# Patient Record
Sex: Female | Born: 1950
Health system: Southern US, Community
[De-identification: ages and names within clinical notes are randomized; demographics above are authoritative.]

## PROBLEM LIST (undated history)

## (undated) DIAGNOSIS — E785 Hyperlipidemia, unspecified: Secondary | ICD-10-CM

## (undated) DIAGNOSIS — K219 Gastro-esophageal reflux disease without esophagitis: Secondary | ICD-10-CM

## (undated) DIAGNOSIS — R011 Cardiac murmur, unspecified: Secondary | ICD-10-CM

## (undated) DIAGNOSIS — R569 Unspecified convulsions: Secondary | ICD-10-CM

## (undated) DIAGNOSIS — E119 Type 2 diabetes mellitus without complications: Secondary | ICD-10-CM

## (undated) DIAGNOSIS — C50919 Malignant neoplasm of unspecified site of unspecified female breast: Secondary | ICD-10-CM

## (undated) DIAGNOSIS — I639 Cerebral infarction, unspecified: Secondary | ICD-10-CM

## (undated) DIAGNOSIS — M1990 Unspecified osteoarthritis, unspecified site: Secondary | ICD-10-CM

## (undated) DIAGNOSIS — I10 Essential (primary) hypertension: Secondary | ICD-10-CM

## (undated) DIAGNOSIS — E559 Vitamin D deficiency, unspecified: Secondary | ICD-10-CM

## (undated) HISTORY — DX: Vitamin D deficiency, unspecified: E55.9

## (undated) HISTORY — DX: Malignant neoplasm of unspecified site of unspecified female breast: C50.919

## (undated) HISTORY — DX: Unspecified osteoarthritis, unspecified site: M19.90

## (undated) HISTORY — DX: Type 2 diabetes mellitus without complications: E11.9

## (undated) HISTORY — PX: TONSILECTOMY/ADENOIDECTOMY WITH MYRINGOTOMY: SHX6125

## (undated) HISTORY — DX: Gastro-esophageal reflux disease without esophagitis: K21.9

## (undated) HISTORY — DX: Cerebral infarction, unspecified: I63.9

## (undated) HISTORY — DX: Cardiac murmur, unspecified: R01.1

## (undated) HISTORY — DX: Essential (primary) hypertension: I10

## (undated) HISTORY — DX: Hyperlipidemia, unspecified: E78.5

---

## 1998-12-27 HISTORY — PX: ABDOMINAL HYSTERECTOMY: SHX81

## 1999-12-28 DIAGNOSIS — C50919 Malignant neoplasm of unspecified site of unspecified female breast: Secondary | ICD-10-CM

## 1999-12-28 HISTORY — PX: MASTECTOMY: SHX3

## 1999-12-28 HISTORY — DX: Malignant neoplasm of unspecified site of unspecified female breast: C50.919

## 2003-05-01 ENCOUNTER — Encounter: Admission: RE | Admit: 2003-05-01 | Discharge: 2003-06-27 | Payer: Self-pay | Admitting: Unknown Physician Specialty

## 2005-03-02 ENCOUNTER — Ambulatory Visit: Payer: Self-pay | Admitting: Cardiology

## 2005-03-09 ENCOUNTER — Ambulatory Visit: Payer: Self-pay | Admitting: Cardiology

## 2006-04-07 ENCOUNTER — Encounter: Admission: RE | Admit: 2006-04-07 | Discharge: 2006-04-07 | Payer: Self-pay | Admitting: *Deleted

## 2006-04-19 ENCOUNTER — Ambulatory Visit: Payer: Self-pay

## 2008-12-27 HISTORY — PX: GANGLION CYST EXCISION: SHX1691

## 2012-08-09 ENCOUNTER — Ambulatory Visit (AMBULATORY_SURGERY_CENTER): Payer: BC Managed Care – PPO | Admitting: *Deleted

## 2012-08-09 ENCOUNTER — Encounter: Payer: Self-pay | Admitting: Internal Medicine

## 2012-08-09 VITALS — Ht 66.0 in | Wt 174.4 lb

## 2012-08-09 DIAGNOSIS — Z1211 Encounter for screening for malignant neoplasm of colon: Secondary | ICD-10-CM

## 2012-08-09 MED ORDER — NA SULFATE-K SULFATE-MG SULF 17.5-3.13-1.6 GM/177ML PO SOLN: ORAL | Status: DC

## 2012-08-17 ENCOUNTER — Encounter: Payer: Self-pay | Admitting: Internal Medicine

## 2012-08-17 ENCOUNTER — Ambulatory Visit (AMBULATORY_SURGERY_CENTER): Payer: BC Managed Care – PPO | Admitting: Internal Medicine

## 2012-08-17 VITALS — BP 165/85 | HR 65 | Temp 97.9°F | Resp 16 | Ht 66.0 in | Wt 174.0 lb

## 2012-08-17 DIAGNOSIS — Z1211 Encounter for screening for malignant neoplasm of colon: Secondary | ICD-10-CM

## 2012-08-17 DIAGNOSIS — D126 Benign neoplasm of colon, unspecified: Secondary | ICD-10-CM

## 2012-08-17 MED ORDER — SODIUM CHLORIDE 0.9 % IV SOLN: 500.0000 mL | INTRAVENOUS | Status: DC

## 2012-08-17 MED ORDER — HYDROCORTISONE ACETATE 25 MG RE SUPP: 25.0000 mg | Freq: Two times a day (BID) | RECTAL | Status: AC

## 2012-08-17 NOTE — Patient Instructions (Addendum)

## 2012-08-17 NOTE — Op Note (Signed)
[facility] [address] [place], [address]   COLONOSCOPY PROCEDURE REPORT  PATIENT: Renee, [name]  MR#: [id number] BIRTHDATE: 09-15-1951 , 61  yrs. old GENDER: Female ENDOSCOPIST: Beverley Fiedler, MD REFERRED XB:[id number], Cynthia PROCEDURE DATE:  08/17/2012 PROCEDURE:   Colonoscopy with snare polypectomy ASA CLASS:   Class II INDICATIONS:average risk screening and first colonoscopy. MEDICATIONS: Propofol (Diprivan) and Propofol (Diprivan) 170 mg IV  DESCRIPTION OF PROCEDURE:   After the risks benefits and alternatives of the procedure were thoroughly explained, informed consent was obtained.  A digital rectal exam revealed no rectal mass and A digital rectal exam revealed no abnormalities of the rectum.   The LB [id number]-[id number]  endoscope was introduced through the anus and advanced to the terminal ileum which was intubated for a short distance. No adverse events experienced. The quality of the prep was Suprep excellent  The instrument was then slowly withdrawn as the colon was fully examined.   COLON FINDINGS: The mucosa appeared normal in the terminal ileum. Three sessile polyps ranging between 3-38mm in size were found in the rectosigmoid colon.  Polypectomy was performed with a cold snare.  The resection was complete and the polyp tissue was completely retrieved. Retroflexed views revealed internal hemorrhoids            The scope was withdrawn and the procedure completed. COMPLICATIONS: There were no complications.  ENDOSCOPIC IMPRESSION: 1.   Normal mucosa in the terminal ileum 2.   Three sessile polyps ranging between 3-79mm in size were found in the rectosigmoid colon; polypectomy was performed with a cold snare 3.   Small internal hemorrhoids  RECOMMENDATIONS: 1.  Await pathology results 2.  If the polyp(s) removed today are proven to be adenomatous (pre-cancerous) polyps, you will need a repeat colonoscopy in 5 years.  Otherwise you should  continue to follow colorectal cancer screening guidelines for "routine risk" patients with colonoscopy in 10 years.  You will receive a letter within 1-2 weeks with the results of your biopsy as well as final recommendations.  Please call my office if you have not received a letter after 3 weeks. 3.  Trial of hydrocortisone suppository twice daily for 1 week.  eSigned:  Beverley Fiedler, MD 08/17/2012 11:59 AM   cc: Samuel Jester, DO and The Patient   PATIENT NAME:  Renee, [name] MR#: [id number]

## 2012-08-17 NOTE — Progress Notes (Signed)
The pt tolerated the colonoscopy very well. Maw   

## 2012-08-17 NOTE — Progress Notes (Signed)
Patient did not experience any of the following events: a burn prior to discharge; a fall within the facility; wrong site/side/patient/procedure/implant event; or a hospital transfer or hospital admission upon discharge from the facility. (G8907) Patient did not have preoperative order for IV antibiotic SSI prophylaxis. (G8918)  

## 2012-08-18 ENCOUNTER — Telehealth: Payer: Self-pay

## 2012-08-18 NOTE — Telephone Encounter (Signed)
Left message with family member to tell her we called. May call back with any questions or concerns.

## 2012-08-21 ENCOUNTER — Encounter: Payer: Self-pay | Admitting: Internal Medicine

## 2012-08-23 ENCOUNTER — Encounter: Payer: Self-pay | Admitting: Internal Medicine

## 2013-04-18 DIAGNOSIS — R42 Dizziness and giddiness: Secondary | ICD-10-CM

## 2013-12-27 LAB — HM MAMMOGRAPHY

## 2013-12-31 ENCOUNTER — Emergency Department (HOSPITAL_COMMUNITY)
Admission: EM | Admit: 2013-12-31 | Discharge: 2013-12-31 | Disposition: A | Discharge status: Home / Self Care | Payer: BC Managed Care – PPO | Attending: Emergency Medicine | Admitting: Emergency Medicine

## 2013-12-31 ENCOUNTER — Emergency Department (HOSPITAL_COMMUNITY): Payer: BC Managed Care – PPO

## 2013-12-31 ENCOUNTER — Encounter (HOSPITAL_COMMUNITY): Payer: Self-pay | Admitting: Emergency Medicine

## 2013-12-31 DIAGNOSIS — I10 Essential (primary) hypertension: Secondary | ICD-10-CM | POA: Insufficient documentation

## 2013-12-31 DIAGNOSIS — Z8673 Personal history of transient ischemic attack (TIA), and cerebral infarction without residual deficits: Secondary | ICD-10-CM | POA: Insufficient documentation

## 2013-12-31 DIAGNOSIS — M129 Arthropathy, unspecified: Secondary | ICD-10-CM | POA: Insufficient documentation

## 2013-12-31 DIAGNOSIS — R259 Unspecified abnormal involuntary movements: Secondary | ICD-10-CM | POA: Insufficient documentation

## 2013-12-31 DIAGNOSIS — Z791 Long term (current) use of non-steroidal anti-inflammatories (NSAID): Secondary | ICD-10-CM | POA: Insufficient documentation

## 2013-12-31 DIAGNOSIS — K219 Gastro-esophageal reflux disease without esophagitis: Secondary | ICD-10-CM | POA: Insufficient documentation

## 2013-12-31 DIAGNOSIS — G40909 Epilepsy, unspecified, not intractable, without status epilepticus: Secondary | ICD-10-CM | POA: Insufficient documentation

## 2013-12-31 DIAGNOSIS — Z853 Personal history of malignant neoplasm of breast: Secondary | ICD-10-CM | POA: Insufficient documentation

## 2013-12-31 DIAGNOSIS — E119 Type 2 diabetes mellitus without complications: Secondary | ICD-10-CM | POA: Insufficient documentation

## 2013-12-31 DIAGNOSIS — E559 Vitamin D deficiency, unspecified: Secondary | ICD-10-CM | POA: Insufficient documentation

## 2013-12-31 DIAGNOSIS — R011 Cardiac murmur, unspecified: Secondary | ICD-10-CM | POA: Insufficient documentation

## 2013-12-31 DIAGNOSIS — Z79899 Other long term (current) drug therapy: Secondary | ICD-10-CM | POA: Insufficient documentation

## 2013-12-31 DIAGNOSIS — Z7982 Long term (current) use of aspirin: Secondary | ICD-10-CM | POA: Insufficient documentation

## 2013-12-31 DIAGNOSIS — E785 Hyperlipidemia, unspecified: Secondary | ICD-10-CM | POA: Insufficient documentation

## 2013-12-31 DIAGNOSIS — R569 Unspecified convulsions: Secondary | ICD-10-CM

## 2013-12-31 HISTORY — DX: Unspecified convulsions: R56.9

## 2013-12-31 LAB — COMPREHENSIVE METABOLIC PANEL
ALT: 20 U/L (ref 0–35)
AST: 23 U/L (ref 0–37)
Albumin: 3.8 g/dL (ref 3.5–5.2)
Alkaline Phosphatase: 86 U/L (ref 39–117)
BUN: 23 mg/dL (ref 6–23)
CHLORIDE: 101 meq/L (ref 96–112)
CO2: 24 mEq/L (ref 19–32)
Calcium: 10 mg/dL (ref 8.4–10.5)
Creatinine, Ser: 0.87 mg/dL (ref 0.50–1.10)
GFR calc Af Amer: 80 mL/min — ABNORMAL LOW (ref >90)
GFR, EST NON AFRICAN AMERICAN: 69 mL/min — AB (ref >90)
GLUCOSE: 186 mg/dL — AB (ref 70–99)
POTASSIUM: 4.3 meq/L (ref 3.7–5.3)
SODIUM: 138 meq/L (ref 137–147)
Total Bilirubin: 0.3 mg/dL (ref 0.3–1.2)
Total Protein: 7.8 g/dL (ref 6.0–8.3)

## 2013-12-31 LAB — CBC WITH DIFFERENTIAL/PLATELET
BASOS ABS: 0 10*3/uL (ref 0.0–0.1)
Basophils Relative: 0 % (ref 0–1)
EOS ABS: 0 10*3/uL (ref 0.0–0.7)
EOS PCT: 1 % (ref 0–5)
HEMATOCRIT: 40.9 % (ref 36.0–46.0)
Hemoglobin: 13.7 g/dL (ref 12.0–15.0)
LYMPHS PCT: 26 % (ref 12–46)
Lymphs Abs: 1.3 10*3/uL (ref 0.7–4.0)
MCH: 29.3 pg (ref 26.0–34.0)
MCHC: 33.5 g/dL (ref 30.0–36.0)
MCV: 87.4 fL (ref 78.0–100.0)
MONO ABS: 0.3 10*3/uL (ref 0.1–1.0)
MONOS PCT: 7 % (ref 3–12)
NEUTROS ABS: 3.4 10*3/uL (ref 1.7–7.7)
Neutrophils Relative %: 66 % (ref 43–77)
PLATELETS: 305 10*3/uL (ref 150–400)
RBC: 4.68 MIL/uL (ref 3.87–5.11)
RDW: 12.7 % (ref 11.5–15.5)
WBC: 5.1 10*3/uL (ref 4.0–10.5)

## 2013-12-31 MED ORDER — LORAZEPAM 2 MG/ML IJ SOLN
0.5000 mg | Freq: Once | INTRAMUSCULAR | Status: AC
  Administered 2013-12-31: 0.5 mg via INTRAVENOUS
  Filled 2013-12-31: qty 1

## 2013-12-31 MED ORDER — SODIUM CHLORIDE 0.9 % IV SOLN
500.0000 mg | Freq: Once | INTRAVENOUS | Status: AC
  Administered 2013-12-31: 500 mg via INTRAVENOUS
  Filled 2013-12-31: qty 5

## 2013-12-31 NOTE — ED Provider Notes (Signed)
CSN: [id number]     Arrival date & time 12/31/13  1032 History  This chart was scribed for Renee Diego, MD by Roxan Diesel, ED scribe.  This patient was seen in room APA11/APA11 and the patient's care was started at 11:09 AM.   Chief Complaint  Patient presents with  . Seizures    Patient is a 63 y.o. female presenting with seizures. The history is provided by the patient and a relative. No language interpreter was used.  Seizures Seizure activity on arrival: no   Preceding symptoms comment:  "shaking" per patient Episode characteristics: abnormal movements, generalized shaking and partial responsiveness   Episode characteristics comment:  "jumping, shaking, eyes rolling back in head," not verbally responsive Postictal symptoms comment:  "don't feel too good" Return to baseline: no   Severity:  Moderate Duration: longer than 20 minutes. History of seizures: yes (only over past month)     HPI Comments: Renee [name] is a 63 y.o. female who presents to the Emergency Department complaining of a suspected seizure that occurred this morning.  Family describes the seizure as an episode of "jumping, shaking, eyes rolling back in head" which lasted "a long time" (longer than 20 minutes).  Family deny syncope but state pt was not verbally responsive during the episode.  Pt states she did feel "shaking" prior to the episode.  Seizure activity has resolved on evaluation but pt states she still "don't feel too good."  Family reports that pt only began having these episode around one month ago several days after she began taking metformin for borderline DM.  Prior episodes lasted only from 5-20 minutes but were otherwise similar to her episode today.  Family report they have been told that these episodes are actually tremors rather than seizures.  Pt has since been taken off of metformin.  She was recently started on Keppra 1 tablet 2x/day and has a f/u neurology appointment in 5 days.  She  took her Keppra this morning.  Per family, pt has received a head CT and MRI at [facility].   Past Medical History  Diagnosis Date  . Hypertension   . GERD (gastroesophageal reflux disease)   . Vitamin D deficiency   . Hyperlipidemia   . Arthritis   . Heart murmur   . CVA (cerebral infarction) 2003, 2005  . Breast cancer 2001    right mastectomy  . Seizures    Past Surgical History  Procedure Laterality Date  . Mastectomy  2001    right  . Total abominal hysterectomy  2000  . Ganglion cyst excision  2010    right hand   Family History  Problem Relation Age of Onset  . Colon cancer Neg Hx   . Stomach cancer Neg Hx    History  Substance Use Topics  . Smoking status: Never Smoker   . Smokeless tobacco: Never Used  . Alcohol Use: No   OB History   Grav Para Term Preterm Abortions TAB SAB Ect Mult Living                 Review of Systems  Constitutional: Negative for appetite change and fatigue.  HENT: Negative for congestion, ear discharge and sinus pressure.   Eyes: Negative for discharge.  Respiratory: Negative for cough.   Cardiovascular: Negative for chest pain.  Gastrointestinal: Negative for abdominal pain and diarrhea.  Genitourinary: Negative for frequency and hematuria.  Musculoskeletal: Negative for back pain.  Skin: Negative for rash.  Neurological: Positive  for seizures. Negative for headaches.  Psychiatric/Behavioral: Negative for hallucinations.    Allergies  Metformin and related  Home Medications   Current Outpatient Rx  Name  Route  Sig  Dispense  Refill  . aspirin 81 MG tablet   Oral   Take 81 mg by mouth daily.         . bisoprolol-hydrochlorothiazide (ZIAC) 2.5-6.25 MG per tablet   Oral   Take 1 tablet by mouth daily.          Marland Kitchen docusate sodium (COLACE) 100 MG capsule   Oral   Take 100 mg by mouth daily.         . meloxicam (MOBIC) 7.5 MG tablet   Oral   Take 7.5 mg by mouth daily.         Marland Kitchen omeprazole (PRILOSEC) 20 MG  capsule   Oral   Take 20 mg by mouth daily.          . simvastatin (ZOCOR) 40 MG tablet   Oral   Take 40 mg by mouth at bedtime.          . Vitamin D, Ergocalciferol, (DRISDOL) 50000 UNITS CAPS      50,000 Units every 7 (seven) days.           BP 122/77  Pulse 75  Temp(Src) 98.6 F (37 C) (Oral)  Resp 19  Ht 5\' 6"  (1.676 m)  Wt 172 lb (78.019 kg)  BMI 27.77 kg/m2  SpO2 98%  Physical Exam  Nursing note and vitals reviewed. Constitutional: She is oriented to person, place, and time. She appears well-developed.  HENT:  Head: Normocephalic.  Eyes: Conjunctivae and EOM are normal. No scleral icterus.  Neck: Neck supple. No thyromegaly present.  Cardiovascular: Normal rate and regular rhythm.  Exam reveals no gallop and no friction rub.   No murmur heard. Pulmonary/Chest: No stridor. She has no wheezes. She has no rales. She exhibits no tenderness.  Abdominal: She exhibits no distension. There is no tenderness. There is no rebound.  Musculoskeletal: Normal range of motion. She exhibits no edema.  Lymphadenopathy:    She has no cervical adenopathy.  Neurological: She is oriented to person, place, and time. She displays tremor. She exhibits normal muscle tone. Coordination normal.  With lifting her legs, started to have tremors bilaterally  Skin: No rash noted. No erythema.  Psychiatric: She has a normal mood and affect. Her behavior is normal.    ED Course  Procedures (including critical care time)  DIAGNOSTIC STUDIES: Oxygen Saturation is 98% on room air, normal by my interpretation.    COORDINATION OF CARE: 11:14 AM-Discussed treatment plan which includes head CT with pt at bedside and pt agreed to plan.    Labs Review Labs Reviewed  COMPREHENSIVE METABOLIC PANEL - Abnormal; Notable for the following:    Glucose, Bld 186 (*)    GFR calc non Af Amer 69 (*)    GFR calc Af Amer 80 (*)    All other components within normal limits  CBC WITH DIFFERENTIAL     Imaging Review Ct Head Wo Contrast  12/31/2013   CLINICAL DATA:  Seizure.  EXAM: CT HEAD WITHOUT CONTRAST  TECHNIQUE: Contiguous axial images were obtained from the base of the skull through the vertex without intravenous contrast.  COMPARISON:  [facility] 04/07/2016  FINDINGS: No mass. No hydrocephalus. No hemorrhage. No acute bony abnormality. Visualized paranasal sinuses are clear. Mastoids are clear.  IMPRESSION: Negative exam.   Electronically Signed  By: [place]   On: 12/31/2013 12:09    EKG Interpretation   None       MDM  Sz,  Will increase keppra and follow up with neurology  Renee Diego, MD 12/31/13 [phone]

## 2013-12-31 NOTE — ED Notes (Signed)
Patient with no complaints at this time. Respirations even and unlabored. Skin warm/dry. Discharge instructions reviewed with patient at this time. Patient given opportunity to voice concerns/ask questions. IV removed per policy and band-aid applied to site. Patient discharged at this time and left Emergency Department via wheelchair. Patient in NAD

## 2013-12-31 NOTE — ED Notes (Signed)
Witnessed seizure by family and by EMS today.  EMS reports pt was postictal upon their arrival.  CBG in route 263.  Pt alert to self only at this time.  C/o headache.  nad noted.

## 2013-12-31 NOTE — Discharge Instructions (Signed)
Increase your seizure medicine to 2 pills in the morning and one pill in the evening.  Follow up with the neurologist as planned

## 2014-01-02 ENCOUNTER — Ambulatory Visit (INDEPENDENT_AMBULATORY_CARE_PROVIDER_SITE_OTHER): Payer: BC Managed Care – PPO | Admitting: Neurology

## 2014-01-02 ENCOUNTER — Encounter: Payer: Self-pay | Admitting: Neurology

## 2014-01-02 VITALS — BP 98/62 | HR 60 | Resp 14 | Ht 66.0 in | Wt 177.0 lb

## 2014-01-02 DIAGNOSIS — R569 Unspecified convulsions: Secondary | ICD-10-CM

## 2014-01-02 NOTE — Progress Notes (Signed)
Renee [name] was seen today in neurologic consultation at the request of Terald Sleeper, PA-C.  The consultation is for the evaluation of seizure vs tremor.  She is accompanied by her sister who supplements the hx.  She was seen at [facility] for the same.  I have one note from her hospitalization.  She was hospitalized on December 2; they did not mention seizure in the d/c summary, but did mention that she had difficulty ambulating because of shaking and chills that they felt was secondary to an acute urinary tract infection and resultant weakness.  Keppra was in the list of d/c meds.  Her sister states that what got her to the hospital was that she began to have CP after being on metformin for 3 days.  She called the dr and the metformin was d/c.  On Dec 1,  A friend came to the house and the pt went to answer the door and she was so weak that she could barely move.  She states that the entire L side (arm and leg) felt weak.  Her friend said "whats the matter" and the pt heard her but couldn't respond initially and then could say "I am sick."  Her friend helped her to the sofa and then the pt leaned to the L side.  The pt was able to give her sisters phone number to her friend.  She was shaking on the sofa while awake.  When the paramedics came, they talked with her but she was slow to respond.  Her sister states that some said it was tremor and some at [facility] said it was seizure.  She was started on Keppra in that hospital.  She was d/c to SNF for rehab.  She presented again to [facility] ED on 12/31/12 with an episode of "seizure."  She was at home, got out of bed and began to tremor in the legs and walked back to the bedroom and then began to shake all over without change or alteration in consciousness.  She called her sister while shaking and told her to come over.  Her sister got there, asked if she was okay and she responded but was slow.  Then her eyes seemed to roll back in the head and  she was less responsive.  If someone yelled at her, she would momentarily open the eyes.  It lasted 30-45 min per pts sister. At that hospitalization, her keppra was increased to 2 in the AM and 1 in the PM.  Her sister noted that when it would "wear off" she would note the onset of shaking so that she is now taking it tid.  She denies new stressful events.     The pt sister states that the pt had seizures 15-20 years ago.  Those events consisted of whole body shaking without loss or alteration in consciousness.  No meds were given.  They did think that it was seizure according to the pt and it only lasted 3 months and then went away.     Neuroimaging has  previously been performed.  It  available for my review today.  A CT of the brain was performed on 12/31/13 and was unremarkable.   She apparently had an echo, nuclear stress, MRI brain at [facility] but I don't have those.  She has not had an EEG.    PREVIOUS MEDICATIONS: Not applicable  ALLERGIES:   Allergies  Allergen Reactions  . Metformin And Related Other (See Comments)  seizures    CURRENT MEDICATIONS:  Current Outpatient Prescriptions on File Prior to Visit  Medication Sig Dispense Refill  . aspirin 81 MG tablet Take 81 mg by mouth daily.      Marland Kitchen atorvastatin (LIPITOR) 10 MG tablet Take 10 mg by mouth daily.      . bisoprolol (ZEBETA) 5 MG tablet Take 5 mg by mouth daily.      Marland Kitchen docusate sodium (COLACE) 100 MG capsule Take 100 mg by mouth daily.      Marland Kitchen levETIRAcetam (KEPPRA) 500 MG tablet Take 500 mg by mouth 2 (two) times daily. 2 pills in AM, 1 in PM since Monday      . meloxicam (MOBIC) 7.5 MG tablet Take 7.5 mg by mouth daily.      . Multiple Vitamin (MULTIVITAMIN WITH MINERALS) TABS tablet Take 1 tablet by mouth daily.      Marland Kitchen omeprazole (PRILOSEC) 20 MG capsule Take 20 mg by mouth daily.       . pantoprazole (PROTONIX) 40 MG tablet Take 40 mg by mouth daily.      . potassium chloride SA (K-DUR,KLOR-CON) 20 MEQ tablet Take 20  mEq by mouth daily.      . simvastatin (ZOCOR) 40 MG tablet Take 40 mg by mouth at bedtime.        No current facility-administered medications on file prior to visit.    PAST MEDICAL HISTORY:   Past Medical History  Diagnosis Date  . Hypertension   . GERD (gastroesophageal reflux disease)   . Vitamin D deficiency   . Hyperlipidemia   . Arthritis   . Heart murmur   . CVA (cerebral infarction) 2003, 2005  . Breast cancer 2001    right mastectomy  . Seizures   . DM (diabetes mellitus)     PAST SURGICAL HISTORY:   Past Surgical History  Procedure Laterality Date  . Mastectomy  2001    right  . Abdominal hysterectomy  2000  . Ganglion cyst excision  2010    right hand    SOCIAL HISTORY:   History   Social History  . Marital Status: Single    Spouse Name: N/A    Number of Children: N/A  . Years of Education: N/A   Occupational History  . Not on file.   Social History Main Topics  . Smoking status: Never Smoker   . Smokeless tobacco: Never Used  . Alcohol Use: No  . Drug Use: No  . Sexual Activity: Not on file   Other Topics Concern  . Not on file   Social History Narrative  . No narrative on file    FAMILY HISTORY:   Family Status  Relation Status Death Age  . Mother Deceased     parkinson's disease, alzheimer's disease  . Father Deceased     kidney disease, enlarged heart  . Sister Alive     healthy    ROS:  A complete 10 system review of systems was obtained and was unremarkable apart from what is mentioned above.  PHYSICAL EXAMINATION:    VITALS:   Filed Vitals:   01/02/14 0844  BP: 98/62  Pulse: 60  Resp: 14  Height: 5\' 6"  (1.676 m)  Weight: 177 lb (80.287 kg)    GEN:  Normal appears female in no acute distress.  Appears stated age. HEENT:  Normocephalic, atraumatic. The mucous membranes are moist. The superficial temporal arteries are without ropiness or tenderness. Cardiovascular: Regular rate and rhythm. Lungs: Clear  to  auscultation bilaterally. Neck/Heme: There are no carotid bruits noted bilaterally.  NEUROLOGICAL: Orientation:  The patient is alert and oriented x 3.  Fund of knowledge is appropriate.  Recent and remote memory intact.  Attention span and concentration normal.  Repeats and names without difficulty. Cranial nerves: There is good facial symmetry. The pupils are equal round and reactive to light bilaterally. Fundoscopic exam reveals clear disc margins bilaterally. Extraocular muscles are intact and visual fields are full to confrontational testing. Speech is fluent and clear. Soft palate rises symmetrically and there is no tongue deviation. Hearing is intact to conversational tone. Tone: Tone is good throughout. Sensation: Sensation is intact to light touch and pinprick throughout (facial, trunk, extremities). Vibration is intact at the bilateral big toe.  Interestingly, there is vibrational splitting on the forehead, stating that sometimes she feels it on the left and sometimes not. There is no extinction with double simultaneous stimulation. There is no sensory dermatomal level identified. Coordination:  The patient has no difficulty with RAM's or FNF bilaterally. Motor: Strength is 5/5 in the bilateral upper and lower extremities.  Shoulder shrug is equal and symmetric. There is no pronator drift.  There are no fasciculations noted. DTR's: Deep tendon reflexes are 2/4 at the bilateral biceps, triceps, brachioradialis, patella and trace at the bilateral achilles.  Plantar responses are downgoing bilaterally. Gait and Station: The patient requires both her sister and myself to help her get out of the chair.  She takes a few steps with assistance, and no tremor is noted.  She reports that she feels very unstable.  Mov't examination:  Intermittently, the patient has tremor in both legs.  The frequency of the tremor in the legs varies.  Upon beginning to examine the patient, she jerked her right hand above  the head and the left hand and both legs came straight out.  When asked to bring her hands down and legs down, she did.  Not long thereafter, she had a violent tremor of both hands and both legs that lasted momentarily with turning of the head to both sides, but stopped as the examination proceeded.   IMPRESSION/PLAN  1. Abnormal shaking spells.    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  Much of her examination today was nonphysiologic and atypical.  In addition, when most epileptic seizures present as shaking all over, there is loss of consciousness completely.  They very much want a definitive diagnosis.  She will have an EEG.  Hopefully, we will capture a spell, as she had several in the office today.  If not, then we will consider an ambulatory EEG and even EMU at [facility], if necessary.  For now, I did not change her Keppra, 500 mg 3 times a day and she will remain on the medication.    Addendum:  Following the visit, pt signed release and was able to get records from [place].  MRI brain essentially normal (minimal to mild small vessel disease).  Negative stress test.  Echo essentially normal with normal LVEF.   ED notes, H and P and d/c summary present but notes regarding the actual hospitalization and why Keppra was added are not present.

## 2014-01-02 NOTE — Patient Instructions (Addendum)
1.  EEG is scheduled for Thursday 1/8 at 2:15pm at [facility]. 2.  No driving, no tub baths, no swimming alone, no working with heavy/dangerous equipment.  Someone should be with you all of the time. 3.  Call me after your EEG is done 4.  Continue your keppra for now

## 2014-01-03 ENCOUNTER — Ambulatory Visit: Payer: BC Managed Care – PPO | Admitting: Neurology

## 2014-01-03 ENCOUNTER — Ambulatory Visit (HOSPITAL_COMMUNITY)
Admission: RE | Admit: 2014-01-03 | Discharge: 2014-01-03 | Disposition: A | Discharge status: Home / Self Care | Payer: BC Managed Care – PPO | Source: Ambulatory Visit | Attending: Neurology | Admitting: Neurology

## 2014-01-03 DIAGNOSIS — R569 Unspecified convulsions: Secondary | ICD-10-CM

## 2014-01-03 NOTE — Progress Notes (Signed)
EEG completed; results pending.    

## 2014-01-04 ENCOUNTER — Telehealth: Payer: Self-pay | Admitting: Neurology

## 2014-01-04 DIAGNOSIS — R569 Unspecified convulsions: Secondary | ICD-10-CM

## 2014-01-04 NOTE — Telephone Encounter (Signed)
Let pt know that her EEG looked good.  They did do an EKG lead and there were some minor abnormalities on that.  I know that she follows with cardiology and just had a full cardiology w/u at [facility] but make sure that she knows to f/u with them ([facility] I believe).  As for the EEG, tell her our next step will be to do a 24 hour EEG.  This can be done in a few weeks and Dr. Delice Lesch can read that for [place].  Can we schedule it for the first week of feb with Dr. Delice Lesch to read?

## 2014-01-04 NOTE — Telephone Encounter (Signed)
Spoke with patient. Explained that her EEG looked good and the next step would be to set up a 24 hour EEG. She would like me to call back and set this up with her sister, Berneice Heinrich, on Monday. Her number is [phone]. I talked to her about her abnormal EKG. She states she has not recently seen a heart doctor and the one she saw a long time ago moved from [place]. She does not remember his name. She would like me to talk with her sister about this as well. I will touch base with them again on Monday morning.

## 2014-01-04 NOTE — Procedures (Signed)
TECHNICAL SUMMARY:  An 18 channel referential and bipolar montage EEG using the standard international 10-20 system was performed on the patient described as awake, drowsy and asleep.  The dominant background activity consists of 8-8.5 hertz activity seen most prominantly over the anterior head region.  The backgound activity is reactive to eye opening and closing procedures.  Low voltage fast (beta) activity is distributed symmetrically and maximally over the anterior head regions.  ACTIVATION:  Stepwise photic stimulation at 4-20 flashes per second was performed and did not elicit any abnormal waveforms.  Hyperventilation was performed for 3 minutes with good patient effort and produced no changes in the background activity.  EPILEPTIFORM ACTIVITY:  There were no spikes, sharp waves or paroxysmal activity.  SLEEP:  Stage 1 and 2 sleep were noted.  CARDIAC:  The EKG lead revealed a sinus rhythm with PVC's and irregular p wave distribution at times.  IMPRESSION:  This is a normal EEG for the patients stated age.  There were no focal, hemispheric or lateralizing features.  No epileptiform activity was recorded.  As above, the EKG lead was regular with ectopic PVC's and p wave abnormalities.  Correlate clinically.

## 2014-01-05 ENCOUNTER — Emergency Department (HOSPITAL_COMMUNITY)
Admission: EM | Admit: 2014-01-05 | Discharge: 2014-01-05 | Disposition: A | Discharge status: Home / Self Care | Payer: BC Managed Care – PPO | Attending: Emergency Medicine | Admitting: Emergency Medicine

## 2014-01-05 ENCOUNTER — Encounter (HOSPITAL_COMMUNITY): Payer: Self-pay | Admitting: Emergency Medicine

## 2014-01-05 DIAGNOSIS — Z791 Long term (current) use of non-steroidal anti-inflammatories (NSAID): Secondary | ICD-10-CM | POA: Insufficient documentation

## 2014-01-05 DIAGNOSIS — M129 Arthropathy, unspecified: Secondary | ICD-10-CM | POA: Insufficient documentation

## 2014-01-05 DIAGNOSIS — K219 Gastro-esophageal reflux disease without esophagitis: Secondary | ICD-10-CM | POA: Insufficient documentation

## 2014-01-05 DIAGNOSIS — Z9889 Other specified postprocedural states: Secondary | ICD-10-CM | POA: Insufficient documentation

## 2014-01-05 DIAGNOSIS — Z853 Personal history of malignant neoplasm of breast: Secondary | ICD-10-CM | POA: Insufficient documentation

## 2014-01-05 DIAGNOSIS — G40909 Epilepsy, unspecified, not intractable, without status epilepticus: Secondary | ICD-10-CM | POA: Insufficient documentation

## 2014-01-05 DIAGNOSIS — IMO0002 Reserved for concepts with insufficient information to code with codable children: Secondary | ICD-10-CM | POA: Insufficient documentation

## 2014-01-05 DIAGNOSIS — Z8673 Personal history of transient ischemic attack (TIA), and cerebral infarction without residual deficits: Secondary | ICD-10-CM | POA: Insufficient documentation

## 2014-01-05 DIAGNOSIS — Z7982 Long term (current) use of aspirin: Secondary | ICD-10-CM | POA: Insufficient documentation

## 2014-01-05 DIAGNOSIS — R259 Unspecified abnormal involuntary movements: Secondary | ICD-10-CM | POA: Insufficient documentation

## 2014-01-05 DIAGNOSIS — L299 Pruritus, unspecified: Secondary | ICD-10-CM | POA: Insufficient documentation

## 2014-01-05 DIAGNOSIS — E785 Hyperlipidemia, unspecified: Secondary | ICD-10-CM | POA: Insufficient documentation

## 2014-01-05 DIAGNOSIS — R011 Cardiac murmur, unspecified: Secondary | ICD-10-CM | POA: Insufficient documentation

## 2014-01-05 DIAGNOSIS — E119 Type 2 diabetes mellitus without complications: Secondary | ICD-10-CM | POA: Insufficient documentation

## 2014-01-05 DIAGNOSIS — Z79899 Other long term (current) drug therapy: Secondary | ICD-10-CM | POA: Insufficient documentation

## 2014-01-05 DIAGNOSIS — I10 Essential (primary) hypertension: Secondary | ICD-10-CM | POA: Insufficient documentation

## 2014-01-05 MED ORDER — DIPHENHYDRAMINE HCL 25 MG PO TABS: 50.0000 mg | ORAL_TABLET | ORAL | Status: DC | PRN

## 2014-01-05 MED ORDER — PREDNISONE 20 MG PO TABS: ORAL_TABLET | ORAL | Status: DC

## 2014-01-05 NOTE — ED Provider Notes (Signed)
CSN: [id number]     Arrival date & time 01/05/14  1736 History   First MD Initiated Contact with Patient 01/05/14 1820     Chief Complaint  Patient presents with  . Pruritis   (Consider location/radiation/quality/duration/timing/severity/associated sxs/prior Treatment) HPI 63 year old female started on Toco for questionable seizure versus pseudoseizure versus generalized tremors as patient has had some spells when she is wide awake her whole body seems to shake and she feels generally weak but has not her issue today she was seen by neurology after having an unremarkable MRI at [facility] and normal OutPt EEG, until now neurology had continued the patient's Keppra, but now the patient presents with 2-3 days of generalized body itching without swelling without rash without pain without tongue swelling without lip swelling without shortness of breath without vomiting without lightheadedness however every time she takes Keppra her itching seems to get worse so she presents to the emergency department in the neurology clinic is closed over the weekend, her generalized itching is currently mild but does become moderate at times and has been constantly waxing and waning for the last few days. There is no treatment prior to arrival. Past Medical History  Diagnosis Date  . Hypertension   . GERD (gastroesophageal reflux disease)   . Vitamin D deficiency   . Hyperlipidemia   . Arthritis   . Heart murmur   . CVA (cerebral infarction) 2003, 2005  . Seizures   . DM (diabetes mellitus)   . Breast cancer 2001    right mastectomy   Past Surgical History  Procedure Laterality Date  . Mastectomy  2001    right  . Abdominal hysterectomy  2000  . Ganglion cyst excision  2010    right hand   Family History  Problem Relation Age of Onset  . Colon cancer Neg Hx   . Stomach cancer Neg Hx    History  Substance Use Topics  . Smoking status: Never Smoker   . Smokeless tobacco: Never  Used  . Alcohol Use: No   OB History   Grav Para Term Preterm Abortions TAB SAB Ect Mult Living                 Review of Systems 10 Systems reviewed and are negative for acute change except as noted in the HPI. Allergies  Metformin and related  Home Medications   Current Outpatient Rx  Name  Route  Sig  Dispense  Refill  . aspirin 81 MG tablet   Oral   Take 81 mg by mouth daily.         Marland Kitchen atorvastatin (LIPITOR) 10 MG tablet   Oral   Take 10 mg by mouth daily.         . bisoprolol (ZEBETA) 5 MG tablet   Oral   Take 5 mg by mouth daily.         . diphenhydrAMINE (BENADRYL) 25 MG tablet   Oral   Take 2 tablets (50 mg total) by mouth every 4 (four) hours as needed for itching.   20 tablet   0   . docusate sodium (COLACE) 100 MG capsule   Oral   Take 100 mg by mouth daily.         . meloxicam (MOBIC) 7.5 MG tablet   Oral   Take 7.5 mg by mouth daily.         . Multiple Vitamin (MULTIVITAMIN WITH MINERALS) TABS tablet   Oral   Take  1 tablet by mouth daily.         Marland Kitchen omeprazole (PRILOSEC) 20 MG capsule   Oral   Take 20 mg by mouth daily.          . pantoprazole (PROTONIX) 40 MG tablet   Oral   Take 40 mg by mouth daily.         . potassium chloride SA (K-DUR,KLOR-CON) 20 MEQ tablet   Oral   Take 20 mEq by mouth daily.         . predniSONE (DELTASONE) 20 MG tablet      3 tabs po daily x 2 days   6 tablet   0   . simvastatin (ZOCOR) 40 MG tablet   Oral   Take 40 mg by mouth at bedtime.           BP 145/73  Pulse 70  Temp(Src) 98.3 F (36.8 C) (Oral)  Resp 16  Ht 5\' 6"  (1.676 m)  Wt 170 lb (77.111 kg)  BMI 27.45 kg/m2  SpO2 99% Physical Exam  Nursing note and vitals reviewed. Constitutional:  Awake, alert, nontoxic appearance.  HENT:  Head: Atraumatic.  Mouth/Throat: Oropharynx is clear and moist.  Lips and tongue are normal  Eyes: Right eye exhibits no discharge. Left eye exhibits no discharge.  Neck: Neck supple.   Cardiovascular: Normal rate and regular rhythm.   No murmur heard. Pulmonary/Chest: Effort normal and breath sounds normal. No respiratory distress. She has no wheezes. She has no rales. She exhibits no tenderness.  Abdominal: Soft. There is no tenderness. There is no rebound.  Musculoskeletal: She exhibits no edema and no tenderness.  Baseline ROM, no obvious new focal weakness.  Neurological: She is alert.  Mental status and motor strength appears baseline for patient and situation.  Skin: Skin is dry. No rash noted.  Psychiatric: She has a normal mood and affect.    ED Course  Procedures (including critical care time) No rash but since itching gets worse after taking Keppra cannot rule out adverse drug reaction. Patient and family agree since it is uncertain if patient even had a seizure disorder it appears reasonable to stop Keppra for now not to start a new anticonvulsant and instead have the patient call her neurologist in 2 days for further medication advice.Patient / Family / Caregiver informed of clinical course, understand medical decision-making process, and agree with plan. Labs Review Labs Reviewed - No data to display Imaging Review No results found.  EKG Interpretation   None       MDM   1. Itching    I doubt any other EMC precluding discharge at this time including, but not necessarily limited to the following:anaphylaxis.    Babette Relic, MD 01/06/14 [phone]

## 2014-01-05 NOTE — Discharge Instructions (Signed)
Pruritus  Pruritis is an itch. There are many different problems that can cause an itch. Dry skin is one of the most common causes of itching. Most cases of itching do not require medical attention.  HOME CARE INSTRUCTIONS  Make sure your skin is moistened on a regular basis. A moisturizer that contains petroleum jelly is best for keeping moisture in your skin. If you develop a rash, you may try the following for relief:   Use corticosteroid cream.  Apply cool compresses to the affected areas.  Bathe with Epsom salts or baking soda in the bathwater.  Soak in colloidal oatmeal baths. These are available at your pharmacy.  Apply baking soda paste to the rash. Stir water into baking soda until it reaches a paste-like consistency.  Use an anti-itch lotion.  Take over-the-counter diphenhydramine medicine by mouth as the instructions direct.  Avoid scratching. Scratching may cause the rash to become infected. If itching is very bad, your caregiver may suggest prescription lotions or creams to lessen your symptoms.  Avoid hot showers, which can make itching worse. A cold shower may help with itching as long as you use a moisturizer after the shower. SEEK MEDICAL CARE IF: The itching does not go away after several days. Document Released: 08/25/2011 Document Revised: 03/06/2012 Document Reviewed: 08/25/2011 [facility] Patient Information 2014 [place], [place].  SEEK MEDICAL ATTENTION IF: You still have considerable itching after taking the medication (prescribed or purchased over the counter) for 24 hours.  A temperature above 100.4 develops.  You have any pain or swelling in your joints.    You develop new and unexplained symptoms (problems). SEEK IMMEDIATE MEDICAL ATTENTION IF: You have swollen lips or tongue.  You develop shortness of breath dizziness confusion or other concerns.

## 2014-01-05 NOTE — ED Notes (Signed)
Pt was seen Monday for seizure was told to double keppra dose and take in the AM instead of PM, since then has been experiencing itching on face and arms, no rash noted.

## 2014-01-06 NOTE — Telephone Encounter (Signed)
See below

## 2014-01-06 NOTE — Telephone Encounter (Signed)
She did see a cardiologist while she was in pt a few weeks ago at [facility] and she just needs to f/u with that dr ([facility] I think)

## 2014-01-07 NOTE — Telephone Encounter (Signed)
Ambulatory EEG set up at [facility] on 01/30/14 at 10:00 am. Patient's sister made aware. Mardene Celeste [phone]. They will send a packet to the patient with information about the visit. Her sister does not remember a cardiology work up either. I advised this was performed at [facility]. I gave her the number to follow up with [facility] in [place]. Phone- [phone]. Address- [address] [place], [place], [place] [address]. Information given to patient's sister and she will set up a follow up appt within the month. Mardene Celeste also wanted to make [place] aware that patient had, what they believe, was an allergic reaction to Keppra this weekend. She was seen in the ER on Saturday. Notes in EPIC. Per ER note patient advised to stop Keppra and call our office to change medication. Patient staying with her other sister, Stanton Kidney, and can be reached at [phone]. Dr Tat- Please advise.

## 2014-01-07 NOTE — Telephone Encounter (Signed)
Spoke with patient and made her aware, per Dr Tat, to stay off her medication. We will make her an appt with Dr Delice Lesch once her schedule opens in February. Her name was given to our office manager to contact once that happens. She is okay with this plan. She is staying with her sister and knows to continue to stay with someone. She will call us back with any questions or problems prior.

## 2014-01-07 NOTE — Telephone Encounter (Signed)
I saw that.  Not sure it was an allergic reaction or not, but since we don't know what these events are yet, it is my suggestion that she stay off of the medication for now.  Someone still needs to be with her.  Make her a f/u with Dr. Delice Lesch as Dr. Delice Lesch will be the one to read her EEG (Renee [name] has a list of pts that need to see Dr. Delice Lesch)

## 2014-01-24 ENCOUNTER — Telehealth: Payer: Self-pay | Admitting: Neurology

## 2014-01-28 NOTE — Telephone Encounter (Signed)
Spoke with patient and made her aware that we will contact her when the Ambulatory EEG equipment back up and functional.

## 2014-01-30 ENCOUNTER — Other Ambulatory Visit (HOSPITAL_COMMUNITY): Payer: BC Managed Care – PPO

## 2014-02-05 ENCOUNTER — Ambulatory Visit (INDEPENDENT_AMBULATORY_CARE_PROVIDER_SITE_OTHER): Payer: BC Managed Care – PPO | Admitting: General Practice

## 2014-02-05 ENCOUNTER — Encounter (INDEPENDENT_AMBULATORY_CARE_PROVIDER_SITE_OTHER): Payer: Self-pay

## 2014-02-05 ENCOUNTER — Encounter: Payer: Self-pay | Admitting: General Practice

## 2014-02-05 VITALS — BP 137/76 | HR 72 | Temp 97.7°F | Ht 66.0 in | Wt 176.0 lb

## 2014-02-05 DIAGNOSIS — R569 Unspecified convulsions: Secondary | ICD-10-CM | POA: Insufficient documentation

## 2014-02-05 DIAGNOSIS — Z8673 Personal history of transient ischemic attack (TIA), and cerebral infarction without residual deficits: Secondary | ICD-10-CM

## 2014-02-05 DIAGNOSIS — E785 Hyperlipidemia, unspecified: Secondary | ICD-10-CM

## 2014-02-05 DIAGNOSIS — E1159 Type 2 diabetes mellitus with other circulatory complications: Secondary | ICD-10-CM | POA: Insufficient documentation

## 2014-02-05 DIAGNOSIS — E1169 Type 2 diabetes mellitus with other specified complication: Secondary | ICD-10-CM | POA: Insufficient documentation

## 2014-02-05 DIAGNOSIS — I10 Essential (primary) hypertension: Secondary | ICD-10-CM

## 2014-02-05 DIAGNOSIS — K219 Gastro-esophageal reflux disease without esophagitis: Secondary | ICD-10-CM

## 2014-02-05 MED ORDER — OMEPRAZOLE 20 MG PO CPDR: 20.0000 mg | DELAYED_RELEASE_CAPSULE | Freq: Every day | ORAL | Status: DC

## 2014-02-05 MED ORDER — SIMVASTATIN 40 MG PO TABS: 40.0000 mg | ORAL_TABLET | Freq: Every day | ORAL | Status: DC

## 2014-02-05 MED ORDER — BISOPROLOL-HYDROCHLOROTHIAZIDE 2.5-6.25 MG PO TABS: 1.0000 | ORAL_TABLET | Freq: Every day | ORAL | Status: DC

## 2014-02-05 NOTE — Patient Instructions (Signed)

## 2014-02-05 NOTE — Progress Notes (Signed)
   Subjective:    Patient ID: Renee [name], female    DOB: 14-Oct-1951, 63 y.o.   MRN: [id number]  HPI Patient presents today to establish care. History of hypertension, hyperlipidemia, diabetes, gerd, and cva. History of recent seizures, but none in past 3 weeks. Being followed by neurologist and upcoming appointment on 02/20/14.     Review of Systems  Constitutional: Negative for fever and chills.  Respiratory: Negative for chest tightness and shortness of breath.   Cardiovascular: Negative for chest pain and palpitations.  Gastrointestinal: Negative for nausea, vomiting, abdominal pain, diarrhea, constipation and blood in stool.  Neurological: Negative for dizziness, weakness and headaches.       Objective:   Physical Exam  Constitutional: She is oriented to person, place, and time. She appears well-developed and well-nourished.  HENT:  Head: Normocephalic and atraumatic.  Right Ear: External ear normal.  Left Ear: External ear normal.  Mouth/Throat: Oropharynx is clear and moist.  Eyes: Pupils are equal, round, and reactive to light.  Neck: Normal range of motion. Neck supple. No thyromegaly present.  Cardiovascular: Normal rate, regular rhythm and normal heart sounds.   Pulmonary/Chest: Effort normal and breath sounds normal. No respiratory distress. She exhibits no tenderness.  Abdominal: Soft. Bowel sounds are normal. She exhibits no distension. There is no tenderness.  Lymphadenopathy:    She has no cervical adenopathy.  Neurological: She is alert and oriented to person, place, and time.  Skin: Skin is warm and dry.  Psychiatric: She has a normal mood and affect.          Assessment & Plan:  1. GERD (gastroesophageal reflux disease)  - omeprazole (PRILOSEC) 20 MG capsule; Take 1 capsule (20 mg total) by mouth daily.  Dispense: 30 capsule; Refill: 3  2. HTN (hypertension)  - bisoprolol-hydrochlorothiazide (ZIAC) 2.5-6.25 MG per tablet; Take 1 tablet by mouth  daily.  Dispense: 30 tablet; Refill: 3  3. HLD (hyperlipidemia)  - simvastatin (ZOCOR) 40 MG tablet; Take 1 tablet (40 mg total) by mouth at bedtime.  Dispense: 30 tablet; Refill: 3 Continue all current medications Labs pending F/u in 1 month, for labs Discussed benefits of healthy eating Patient and sister verbalized understanding Erby Pian, FNP-C

## 2014-02-20 ENCOUNTER — Ambulatory Visit (HOSPITAL_COMMUNITY)
Admission: RE | Admit: 2014-02-20 | Discharge: 2014-02-20 | Disposition: A | Discharge status: Home / Self Care | Payer: BC Managed Care – PPO | Source: Ambulatory Visit | Attending: Neurology | Admitting: Neurology

## 2014-02-20 DIAGNOSIS — R569 Unspecified convulsions: Secondary | ICD-10-CM

## 2014-02-20 NOTE — Progress Notes (Signed)
AEEG started, pt will come back on 02/21/14 to remove electrodes and return equipment.

## 2014-02-21 NOTE — Progress Notes (Signed)
Pt returned at 10:45 to have AEEG disconnected. All electrodes were still attached very well. Pt was given a journal to keep with instructions on what to document. She brought that back with only 2 entries. She does report no seizures during the EEG.

## 2014-02-25 NOTE — Procedures (Signed)
ELECTROENCEPHALOGRAM REPORT  Dates of Recording: 02/20/2014 to 02/21/2014  Patient's Name: Renee [name] MRN: [id number] Date of Birth: [birth year]  Referring Provider: Dr. Wells Guiles Tat  Procedure: 24-hour ambulatory EEG  History: This is a 63 year old woman with episodes of whole-body shaking without change or alteration in consciousness.  Medications: Keppra, aspirin, Ziac, Mobic, simvastatin  Technical Summary: This is a  24 -hour multichannel digital EEG recording measured by the international 10-20 system with electrodes applied with paste and impedances below 5000 ohms performed as portable with EKG monitoring.  The digital EEG was referentially recorded, reformatted, and digitally filtered in a variety of bipolar and referential montages for optimal display.    DESCRIPTION OF RECORDING: During maximal wakefulness, the background activity consisted of a symmetric medium voltage 10.5-11 Hz posterior dominant rhythm that was reactive to eye opening.  There were no epileptiform discharges or focal slowing seen in wakefulness.  During the recording, the patient progresses through wakefulness, drowsiness, and Stage 2 sleep.  Again, there were no epileptiform discharges seen in sleep.  Events: There were no pushbutton events.  There were no electrographic seizures seen.  EKG lead was unremarkable.  IMPRESSION: This 24 -hour ambulatory EEG study is normal.    CLINICAL CORRELATION: A normal EEG does not exclude a clinical diagnosis of epilepsy.  Typical events were not captured during this study.  If further clinical questions remain, inpatient video EEG monitoring may be helpful. Clinical correlation is advised.   Ellouise Newer, M.D.

## 2014-02-26 ENCOUNTER — Telehealth: Payer: Self-pay | Admitting: Neurology

## 2014-02-26 NOTE — Telephone Encounter (Signed)
Message copied by Annamaria Helling on Tue Feb 26, 2014  8:34 AM ------      Message from: TAT, [place]: Mon Feb 25, 2014  4:47 PM       Please let pt know that her prolonged EEG was normal and did not show evidence of seizure. ------

## 2014-02-26 NOTE — Telephone Encounter (Signed)
Left message on machine for patient to call back.

## 2014-02-27 NOTE — Telephone Encounter (Signed)
Patient returning your call.

## 2014-02-27 NOTE — Telephone Encounter (Signed)
Patient made aware of EEG results. No seizures.

## 2014-03-21 ENCOUNTER — Encounter: Payer: Self-pay | Admitting: Neurology

## 2014-03-21 ENCOUNTER — Ambulatory Visit (INDEPENDENT_AMBULATORY_CARE_PROVIDER_SITE_OTHER): Payer: BC Managed Care – PPO | Admitting: Neurology

## 2014-03-21 VITALS — BP 120/76 | HR 74 | Temp 97.7°F | Ht 66.0 in | Wt 176.5 lb

## 2014-03-21 DIAGNOSIS — R259 Unspecified abnormal involuntary movements: Secondary | ICD-10-CM

## 2014-03-21 DIAGNOSIS — IMO0001 Reserved for inherently not codable concepts without codable children: Secondary | ICD-10-CM

## 2014-03-21 NOTE — Patient Instructions (Signed)
1. Call our office if symptoms progress, we will refer to [facility] for inpatient monitoring at that time 2. Check blood pressure with events 3. Follow-up as needed

## 2014-03-21 NOTE — Progress Notes (Signed)
Renee [name] MRN: [id number] DOB: 03-06-1951  Referring provider: Dr. Wells Guiles Tat Primary care provider: Particia Nearing, PA-C  Reason for consult:  Shaking spells, ?seizures  HISTORY OF PRESENT ILLNESS: This is a pleasant 63 year old right-handed woman presenting for evaluation of shaking episodes.  She was previously seen by one of my partners, Movement Disorders specialist Dr. Carles Collet, 2 months ago during which she was noted to have an atypical and nonphysiologic tremor during the visit, suggestive of psychogenic tremors.  She presents today for evaluation of possible seizures, she has undergone 24-hour EEG monitoring in the interim.  I will summarize her history as well.  She was admitted to [facility] in December 2014 for a UTI with weakness.  There was note of shaking and chills secondary to the infection.  Per report, she was having chest pain after being on Metformin for 3 days, that was then discontinued.  On the day of admission, a friend visited her at home and found her so weak, with note of left arm and leg weakness.  She started shaking with retained awareness.  She was slow to respond to EMS.  They had stated that some said it was tremor and others mentioned seizure, and she was apparently discharged on Keppra at that time.  She was discharged to rehab where she had more shaking episodes.  On 12/31/2013, she was at home and started having a tremor in both legs.  This progressed to whole body shaking with retained awareness.  She was able to call her sister, who arrived at her house with continued shaking.  She was noted to be slow to respond.  EMS was called due to increasingly violent shaking with eyes rolling back.  She was brought to the ER where Keppra was increased.  She feels that she cannot control the shaking, she is able to talk sometimes but feels confused.  She started having itching, attributed to [place], which was discontinued in January.  She has been off anti-seizure  medications since then, with no further similar episodes of whole body shaking.  She however continues to have the leg tremors that can occur when standing or sitting.  If she is standing, she has to sit and wait for a few minutes to quiet down.  Last episode was 4 days ago, she can speak and comprehend during the bilateral leg shaking lasting 10-15 minutes.  They report that if she did not sit down, the shaking in her legs would progress to whole body shaking.  She has been living with her sister since January.    They report a history of seizures in her 17s that were "totally different."  She would have brief shaking lasting 5-10 minutes without loss of awareness.  She was not started on seizure medication.  These lasted 3 months then resolved spontaneously.  She had an MRI brain which was normal.  I personally reviewed her 24-hour EEG which was normal.  Typical whole body shaking episodes were not captured, however the patient reports she had bilateral leg shaking during the study.  She denies any headaches, dizziness, diplopia, dysarthria, dysphagia, neck/back pain, focal numbness/tingling/weakness.  There is no family history of tremors.  She had a normal birth and early development, there is no history of febrile convulsions, CNS infections, significant traumatic brain injury, or family history of seizures.    PAST MEDICAL HISTORY: Past Medical History  Diagnosis Date  . Hypertension   . GERD (gastroesophageal reflux disease)   .  Vitamin D deficiency   . Hyperlipidemia   . Arthritis   . Heart murmur   . CVA (cerebral infarction) 2003, 2005  . Seizures   . DM (diabetes mellitus)   . Breast cancer 2001    right mastectomy    PAST SURGICAL HISTORY: Past Surgical History  Procedure Laterality Date  . Mastectomy  2001    right  . Abdominal hysterectomy  2000  . Ganglion cyst excision  2010    right hand    MEDICATIONS: Current Outpatient Prescriptions on File Prior to Visit    Medication Sig Dispense Refill  . aspirin 81 MG tablet Take 81 mg by mouth daily.      . bisoprolol-hydrochlorothiazide (ZIAC) 2.5-6.25 MG per tablet Take 1 tablet by mouth daily.  30 tablet  3  . docusate sodium (COLACE) 100 MG capsule Take 100 mg by mouth daily.      Marland Kitchen FREESTYLE LITE test strip       . Multiple Vitamin (MULTIVITAMIN WITH MINERALS) TABS tablet Take 1 tablet by mouth daily.      Marland Kitchen omeprazole (PRILOSEC) 20 MG capsule Take 1 capsule (20 mg total) by mouth daily.  30 capsule  3  . [DISCONTINUED] levETIRAcetam (KEPPRA) 500 MG tablet Take 500 mg by mouth 2 (two) times daily. 2 pills in AM, 1 in PM since Monday       No current facility-administered medications on file prior to visit.    ALLERGIES: Allergies  Allergen Reactions  . Metformin And Related Other (See Comments)    seizures    FAMILY HISTORY: Family History  Problem Relation Age of Onset  . Colon cancer Neg Hx   . Stomach cancer Neg Hx     SOCIAL HISTORY: History   Social History  . Marital Status: Single    Spouse Name: N/A    Number of Children: N/A  . Years of Education: N/A   Occupational History  . Not on file.   Social History Main Topics  . Smoking status: Never Smoker   . Smokeless tobacco: Never Used  . Alcohol Use: No  . Drug Use: No  . Sexual Activity: Not Currently    Birth Control/ Protection: Post-menopausal   Other Topics Concern  . Not on file   Social History Narrative  . No narrative on file    REVIEW OF SYSTEMS: Constitutional: No fevers, chills, or sweats, no generalized fatigue, change in appetite Eyes: No visual changes, double vision, eye pain Ear, nose and throat: No hearing loss, ear pain, nasal congestion, sore throat Cardiovascular: No chest pain, palpitations Respiratory:  No shortness of breath at rest or with exertion, wheezes GastrointestinaI: No nausea, vomiting, diarrhea, abdominal pain, fecal incontinence Genitourinary:  No dysuria, urinary retention  or frequency Musculoskeletal:  No neck pain, back pain Integumentary: No rash, pruritus, skin lesions Neurological: as above Psychiatric: No depression, insomnia, anxiety Endocrine: No palpitations, fatigue, diaphoresis, mood swings, change in appetite, change in weight, increased thirst Hematologic/Lymphatic:  No anemia, purpura, petechiae. Allergic/Immunologic: no itchy/runny eyes, nasal congestion, recent allergic reactions, rashes  PHYSICAL EXAM: Filed Vitals:   03/21/14 0829  BP: 120/76  Pulse: 74  Temp: 97.7 F (36.5 C)   General: No acute distress Head:  Normocephalic/atraumatic Neck: supple, no paraspinal tenderness, full range of motion Back: No paraspinal tenderness Heart: regular rate and rhythm Lungs: Clear to auscultation bilaterally. Vascular: No carotid bruits. Skin/Extremities: No rash, no edema Neurological Exam: Mental status: alert and oriented to person, place, and  time, no dysarthria or aphasia, Fund of knowledge is appropriate.  Recent and remote memory are intact.  Attention and concentration are normal.    Able to name objects and repeat phrases. Cranial nerves: CN I: not tested CN II: pupils equal, round and reactive to light, visual fields intact, fundi unremarkable. CN III, IV, VI:  full range of motion, no nystagmus, no ptosis CN V: facial sensation intact CN VII: upper and lower face symmetric CN VIII: hearing intact CN IX, X: gag intact, uvula midline CN XI: sternocleidomastoid and trapezius muscles intact CN XII: tongue midline Bulk & Tone: normal, no fasciculations. Motor: 5/5 throughout with no pronator drift. Sensation: intact to light touch, cold, pin, vibration and joint position sense.  No extinction to double simultaneous stimulation.  Romberg test negative Deep Tendon Reflexes: +2 throughout, no ankle clonus Plantar responses: downgoing bilaterally Cerebellar: no incoordination on finger to nose testing.  She had bilateral endpoint  irregular tremor where she appeared to tap her finger several times when touching the examiner's finger. Gait: narrow-based and steady initially, then she started having bilateral leg shaking that she had to sit down.  BP checked during event was 130/70.    IMPRESSION: This is a 63 year old right-handed woman with a history of hypertension presenting for evaluation of episodes of whole body shaking.  By history, they report that the episodes start with the shaking in both legs that were observed in the office today.  She has retained awareness during the episode, but feels confused after.  She was able to answer questions today while having an episode of bilateral leg shaking that resolved when she sat back down.  Her 24-hour EEG is normal, and although typical events were not captured, we discussed that by semiology witnessed today and described, the episodes are unlikely epileptic seizures.  There is no indication to start anti-epileptic medication at this time.  She has not had any further whole body shaking spells since January, and if these recur, she may benefit from inpatient vEEG monitoring in the future to help patient and family to further understand diagnosis and treat effectively.  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  She would benefit from cognitive behavioral therapy.  Findings were discussed with Dr. Carles Collet, and the patient will follow-up on a prn basis.  Thank you for allowing me to participate in the care of this patient. Please do not hesitate to call for any questions or concerns.  The duration of this appointment visit was 45 minutes of face-to-face time with the patient.  Greater than 50% of this time was spent in counseling, explanation of diagnosis, planning of further management, and coordination of care.   Ellouise Newer, M.D.

## 2014-04-01 ENCOUNTER — Ambulatory Visit (INDEPENDENT_AMBULATORY_CARE_PROVIDER_SITE_OTHER): Payer: BC Managed Care – PPO | Admitting: General Practice

## 2014-04-01 ENCOUNTER — Encounter: Payer: Self-pay | Admitting: General Practice

## 2014-04-01 VITALS — BP 119/72 | HR 72 | Temp 97.2°F | Ht 65.5 in | Wt 177.4 lb

## 2014-04-01 DIAGNOSIS — I10 Essential (primary) hypertension: Secondary | ICD-10-CM

## 2014-04-01 DIAGNOSIS — R7303 Prediabetes: Secondary | ICD-10-CM

## 2014-04-01 DIAGNOSIS — E785 Hyperlipidemia, unspecified: Secondary | ICD-10-CM

## 2014-04-01 DIAGNOSIS — R7309 Other abnormal glucose: Secondary | ICD-10-CM

## 2014-04-01 LAB — POCT CBC
GRANULOCYTE PERCENT: 52.9 % (ref 37–80)
HCT, POC: 42.6 % (ref 37.7–47.9)
HEMOGLOBIN: 13.6 g/dL (ref 12.2–16.2)
LYMPH, POC: 2 (ref 0.6–3.4)
MCH, POC: 28.4 pg (ref 27–31.2)
MCHC: 31.8 g/dL (ref 31.8–35.4)
MCV: 89.4 fL (ref 80–97)
MPV: 8.4 fL (ref 0–99.8)
PLATELET COUNT, POC: 356 10*3/uL (ref 142–424)
POC Granulocyte: 2.5 (ref 2–6.9)
POC LYMPH PERCENT: 42.2 %L (ref 10–50)
RBC: 4.8 M/uL (ref 4.04–5.48)
RDW, POC: 12.8 %
WBC: 4.8 10*3/uL (ref 4.6–10.2)

## 2014-04-01 LAB — POCT GLYCOSYLATED HEMOGLOBIN (HGB A1C): Hemoglobin A1C: 7.1

## 2014-04-01 MED ORDER — BISOPROLOL-HYDROCHLOROTHIAZIDE 2.5-6.25 MG PO TABS: 1.0000 | ORAL_TABLET | Freq: Every day | ORAL | Status: DC

## 2014-04-01 NOTE — Progress Notes (Signed)
   Subjective:    Patient ID: Renee [name], female    DOB: 02/03/1951, 63 y.o.   MRN: [id number]  HPI Patient presents today for chronic health follow up. History of GERD, HLD, HTN, CVA, seizures, and borderline diabetes. Taking medications as prescribed. Checks blood sugars 3 times daily. Ranges 110's before breakfast, lunch and dinner post prandial ranges 110's-180's. Reports eating healthy diet.     Review of Systems  Constitutional: Negative for fever and chills.  Eyes: Negative for photophobia, pain and visual disturbance.  Respiratory: Negative for chest tightness and shortness of breath.   Cardiovascular: Negative for chest pain and palpitations.  Gastrointestinal: Negative for nausea, vomiting, abdominal pain, constipation and blood in stool.  Genitourinary: Negative for dysuria, hematuria and difficulty urinating.  Neurological: Negative for dizziness, weakness and headaches.  Psychiatric/Behavioral: Negative for suicidal ideas and sleep disturbance. The patient is not nervous/anxious.        Objective:   Physical Exam  Constitutional: She is oriented to person, place, and time. She appears well-developed and well-nourished.  HENT:  Head: Normocephalic and atraumatic.  Left Ear: External ear normal.  Mouth/Throat: Oropharynx is clear and moist.  Eyes: EOM are normal. Pupils are equal, round, and reactive to light.  Neck: Normal range of motion. Neck supple. No thyromegaly present.  Cardiovascular: Normal rate, regular rhythm and normal heart sounds.   Pulmonary/Chest: Effort normal and breath sounds normal. No respiratory distress. She exhibits no tenderness.  Lymphadenopathy:    She has no cervical adenopathy.  Neurological: She is alert and oriented to person, place, and time.  Skin: Skin is warm and dry.  Psychiatric: She has a normal mood and affect.          Assessment & Plan:  1. Borderline diabetes mellitus  - POCT glycosylated hemoglobin (Hb  A1C)  2. Hypertension  - POCT CBC - CMP14+EGFR  3. Hyperlipidemia  - Lipid panel  4. HTN (hypertension)  - bisoprolol-hydrochlorothiazide (ZIAC) 2.5-6.25 MG per tablet; Take 1 tablet by mouth daily.  Dispense: 90 tablet; Refill: 1 -Continue all current medications Labs pending F/u in 3 months Discussed benefits of regular exercise and healthy eating Patient and sister verbalized understanding Erby Pian, FNP-C

## 2014-04-01 NOTE — Patient Instructions (Signed)

## 2014-04-02 ENCOUNTER — Other Ambulatory Visit: Payer: Self-pay | Admitting: General Practice

## 2014-04-02 ENCOUNTER — Telehealth: Payer: Self-pay | Admitting: *Deleted

## 2014-04-02 LAB — CMP14+EGFR
ALBUMIN: 4.7 g/dL (ref 3.6–4.8)
ALT: 23 IU/L (ref 0–32)
AST: 24 IU/L (ref 0–40)
Albumin/Globulin Ratio: 1.7 (ref 1.1–2.5)
Alkaline Phosphatase: 91 IU/L (ref 39–117)
BUN / CREAT RATIO: 17 (ref 11–26)
BUN: 14 mg/dL (ref 8–27)
CALCIUM: 10.2 mg/dL (ref 8.7–10.3)
CO2: 24 mmol/L (ref 18–29)
CREATININE: 0.84 mg/dL (ref 0.57–1.00)
Chloride: 100 mmol/L (ref 97–108)
GFR calc Af Amer: 85 mL/min/{1.73_m2} (ref >59)
GFR, EST NON AFRICAN AMERICAN: 74 mL/min/{1.73_m2} (ref >59)
GLOBULIN, TOTAL: 2.8 g/dL (ref 1.5–4.5)
Glucose: 112 mg/dL — ABNORMAL HIGH (ref 65–99)
Potassium: 4.3 mmol/L (ref 3.5–5.2)
Sodium: 141 mmol/L (ref 134–144)
Total Bilirubin: 0.3 mg/dL (ref 0.0–1.2)
Total Protein: 7.5 g/dL (ref 6.0–8.5)

## 2014-04-02 LAB — LIPID PANEL
CHOL/HDL RATIO: 3.1 ratio (ref 0.0–4.4)
Cholesterol, Total: 157 mg/dL (ref 100–199)
HDL: 50 mg/dL (ref >39)
LDL CALC: 86 mg/dL (ref 0–99)
Triglycerides: 105 mg/dL (ref 0–149)
VLDL Cholesterol Cal: 21 mg/dL (ref 5–40)

## 2014-04-02 NOTE — Telephone Encounter (Signed)
Scheduled with pharmacist.

## 2014-04-18 ENCOUNTER — Encounter: Payer: Self-pay | Admitting: Pharmacist

## 2014-04-18 ENCOUNTER — Ambulatory Visit (INDEPENDENT_AMBULATORY_CARE_PROVIDER_SITE_OTHER): Payer: BC Managed Care – PPO | Admitting: Pharmacist

## 2014-04-18 VITALS — BP 120/78 | HR 78 | Ht 65.5 in | Wt 177.0 lb

## 2014-04-18 DIAGNOSIS — E119 Type 2 diabetes mellitus without complications: Secondary | ICD-10-CM

## 2014-04-18 NOTE — Patient Instructions (Signed)
Call toll free number on back of glucometer and ask for new lancing device with clear top.   Blood glucose goals  Before a meal = 80 to 120 Within 2 hours of eating = less than 180  If you find that you are have more than 1 blood glucose reading over 200, call me at [phone] so we can consider starting medication for blood sugar / glucose

## 2014-04-18 NOTE — Progress Notes (Signed)
Subjective:    Renee [name] is a 63 y.o. female who presents for diabetes education and evaluation of Type 2 diabetes mellitus.  Current symptoms/problems include hyperglycemia.   Known diabetic complications: none Cardiovascular risk factors: advanced age (older than 63 for men, 22 for women) and diabetes mellitus Current diabetic medications include none. She has taken metformin in the past.  Shortly after starting metformin she began having seizures and blackouts which lead to discontinuation of metformin  Weight trend: stable Prior visit with dietician: no Current diet: in general, a "healthy" diet  , low fat/ cholesterol, low salt, vegetarian Current exercise: none  Current monitoring regimen: home blood tests - 1 times daily Home blood sugar records: fasting range: 100 to 200 Any episodes of hypoglycemia? no  Is She on ACE inhibitor or angiotensin II receptor blocker?  No   The following portions of the patient's history were reviewed and updated as appropriate: allergies, current medications and problem list.  Objective:    BP 120/78  Pulse 78  Ht 5' 5.5" (1.664 m)  Wt 177 lb (80.287 kg)  BMI 29.00 kg/m2  Lab Review Glucose (mg/dL)  Date Value  04/01/2014 112*     Glucose, Bld (mg/dL)  Date Value  12/31/2013 186*     CO2 (mmol/L)  Date Value  04/01/2014 24   12/31/2013 24      BUN (mg/dL)  Date Value  04/01/2014 14   12/31/2013 23      Creatinine, Ser (mg/dL)  Date Value  04/01/2014 0.84   12/31/2013 0.87      Assessment:    Diabetes Mellitus type II, under fair control.    Plan:    1.  Rx changes: none - try TLC for 2 months.  If A1c still greater than 7% will then add medication therapy 2.  Education: Reviewed 'ABCs' of diabetes management (respective goals in parentheses):  A1C (<7), blood pressure (<140/80), and cholesterol (LDL <100). 3.  Recommended start physical activity - suggested silver sneakers program  4.  Discussed CHO counting diet and  serving size recommendations.  Specifically addressed high CHO / sugar content of sweet tea - patient to discontinue use. 4. Follow up: 3 month   Cherre Robins, PharmD, CPP

## 2014-06-03 ENCOUNTER — Other Ambulatory Visit: Payer: Self-pay | Admitting: General Practice

## 2014-07-04 ENCOUNTER — Ambulatory Visit (INDEPENDENT_AMBULATORY_CARE_PROVIDER_SITE_OTHER): Payer: BC Managed Care – PPO | Admitting: Pharmacist

## 2014-07-04 ENCOUNTER — Encounter: Payer: Self-pay | Admitting: Pharmacist

## 2014-07-04 VITALS — BP 116/78 | HR 75 | Ht 65.5 in | Wt 171.0 lb

## 2014-07-04 DIAGNOSIS — E119 Type 2 diabetes mellitus without complications: Secondary | ICD-10-CM

## 2014-07-04 DIAGNOSIS — E663 Overweight: Secondary | ICD-10-CM

## 2014-07-04 DIAGNOSIS — R635 Abnormal weight gain: Secondary | ICD-10-CM

## 2014-07-04 LAB — POCT GLYCOSYLATED HEMOGLOBIN (HGB A1C): Hemoglobin A1C: 7.2

## 2014-07-04 NOTE — Patient Instructions (Signed)
Diabetes and Standards of Medical Care  Diabetes is complicated. You may find that your diabetes team includes a dietitian, nurse, diabetes educator, eye doctor, and more. To help everyone know what is going on and to help you get the care you deserve, the following schedule of care was developed to help keep you on track. Below are the tests, exams, vaccines, medicines, education, and plans you will need.  Blood Glucose Goals Prior to meals = 80 - 130 Within 2 hours of the start of a meal = less than 180  HbA1c test (goal is less than 7.0% - your last value was 7.1%) This test shows how well you have controlled your glucose over the past 2 3 months. It is used to see if your diabetes management plan needs to be adjusted.   It is performed at least 2 times a year if you are meeting treatment goals.  It is performed 4 times a year if therapy has changed or if you are not meeting treatment goals.   Blood pressure test  This test is performed at every routine medical visit. The goal is less than 140/90 mmHg for most people, but 130/80 mmHg in some cases. Ask your health care provider about your goal. Dental exam  Follow up with the dentist regularly. Eye exam  If you are diagnosed with type 1 diabetes as a child, get an exam upon reaching the age of 70 years or older and have had diabetes for 3 5 years. Yearly eye exams are recommended after that initial eye exam.  If you are diagnosed with type 1 diabetes as an adult, get an exam within 5 years of diagnosis and then yearly.  If you are diagnosed with type 2 diabetes, get an exam as soon as possible after the diagnosis and then yearly. Foot care exam  Visual foot exams are performed at every routine medical visit. The exams check for cuts, injuries, or other problems with the feet.  A comprehensive foot exam should be done yearly. This includes visual inspection as well as assessing foot pulses and testing for loss of sensation.  Check  your feet nightly for cuts, injuries, or other problems with your feet. Tell your health care provider if anything is not healing. Kidney function test (urine microalbumin)  This test is performed once a year.  Type 1 diabetes: The first test is performed 5 years after diagnosis.  Type 2 diabetes: The first test is performed at the time of diagnosis.  A serum creatinine and estimated glomerular filtration rate (eGFR) test is done once a year to assess the level of chronic kidney disease (CKD), if present. Lipid profile (cholesterol, HDL, LDL, triglycerides)  Performed every 5 years for most people.  The goal for LDL is less than 100 mg/dL. If you are at high risk, the goal is less than 70 mg/dL.  The goal for HDL is 40 mg/dL 50 mg/dL for men and 50 mg/dL 60 mg/dL for women. An HDL cholesterol of 60 mg/dL or higher gives some protection against heart disease.  The goal for triglycerides is less than 150 mg/dL. Influenza vaccine, pneumococcal vaccine, and hepatitis B vaccine  The influenza vaccine is recommended yearly.  The pneumococcal vaccine is generally given once in a lifetime. However, there are some instances when another vaccination is recommended. Check with your health care provider.  The hepatitis B vaccine is also recommended for adults with diabetes. Diabetes self-management education  Education is recommended at diagnosis and ongoing  as needed. Treatment plan  Your treatment plan is reviewed at every medical visit. Document Released: 10/10/2009 Document Revised: 08/15/2013 Document Reviewed: 05/15/2013 [facility] Patient Information 2014 [place].

## 2014-07-04 NOTE — Progress Notes (Signed)
Subjective:    Renee [name] is a 63 y.o. female who presents for follow up Type 2 diabetes mellitus.  I last saw her 03/2014 for initial diabetes education and evalulation.    Known diabetic complications: none Cardiovascular risk factors: advanced age (older than 79 for men, 29 for women) and diabetes mellitus Current diabetic medications include none. She has taken metformin in the past.  Shortly after starting metformin she began having seizures and blackouts which lead to discontinuation of metformin  Weight trend: decreased by 6# over last 2 months Prior visit with dietician: no Current diet: in general, a "healthy" diet  , low fat/ cholesterol, low salt, vegetarian Current exercise: walking - 1-2 miles daily  Current monitoring regimen: home blood tests - 1-2 times daily Home blood sugar records: trend: decreasing steadily and ranges from 90 to 197.  average = 136 Any episodes of hypoglycemia? no  Is She on ACE inhibitor or angiotensin II receptor blocker?  No   The following portions of the patient's history were reviewed and updated as appropriate: allergies, current medications and problem list.  Objective:    There were no vitals taken for this visit.  Lab Review Glucose (mg/dL)  Date Value  04/01/2014 112*     Glucose, Bld (mg/dL)  Date Value  12/31/2013 186*     CO2 (mmol/L)  Date Value  04/01/2014 24   12/31/2013 24      BUN (mg/dL)  Date Value  04/01/2014 14   12/31/2013 23      Creatinine, Ser (mg/dL)  Date Value  04/01/2014 0.84   12/31/2013 0.87     A1c was 7.2% today  Assessment:    Diabetes Mellitus type II, under fair control.  Overweight - weight is decreasing with current TLC   Plan:    1.  Rx changes: none -Continue wit TLC 2.  Education: Reviewed 'ABCs' of diabetes management (respective goals in parentheses):  A1C (<7), blood pressure (<140/80), and cholesterol (LDL <100). 3.  Recommend continue current physical activity level - great  job! 4.  Revieweded CHO counting diet and serving size recommendations.   4. Follow up: 3 month   Cherre Robins, PharmD, CPP, CDE

## 2014-07-05 ENCOUNTER — Telehealth: Payer: Self-pay | Admitting: Family Medicine

## 2014-07-05 LAB — BMP8+EGFR
BUN / CREAT RATIO: 23 (ref 11–26)
BUN: 22 mg/dL (ref 8–27)
CALCIUM: 10 mg/dL (ref 8.7–10.3)
CO2: 22 mmol/L (ref 18–29)
CREATININE: 0.96 mg/dL (ref 0.57–1.00)
Chloride: 102 mmol/L (ref 97–108)
GFR calc Af Amer: 72 mL/min/{1.73_m2} (ref >59)
GFR calc non Af Amer: 63 mL/min/{1.73_m2} (ref >59)
Glucose: 127 mg/dL — ABNORMAL HIGH (ref 65–99)
Potassium: 4.4 mmol/L (ref 3.5–5.2)
SODIUM: 141 mmol/L (ref 134–144)

## 2014-07-05 LAB — MICROALBUMIN, URINE: Microalbumin, Urine: 3.5 ug/mL (ref 0.0–17.0)

## 2014-07-05 NOTE — Telephone Encounter (Signed)
Message copied by Waverly Ferrari on Fri Jul 05, 2014  3:50 PM ------      Message from: Cherre Robins      Created: Fri Jul 05, 2014  1:49 PM       Kidney function normal.  Blood glucose slightly elevated.  Microalbumin normal.       Continue as discussed at appt 07/04/14. ------

## 2014-07-10 ENCOUNTER — Encounter: Payer: Self-pay | Admitting: *Deleted

## 2014-07-19 ENCOUNTER — Other Ambulatory Visit: Payer: Self-pay | Admitting: *Deleted

## 2014-07-19 MED ORDER — GLUCOSE BLOOD VI STRP: ORAL_STRIP | Status: DC

## 2014-10-08 ENCOUNTER — Other Ambulatory Visit: Payer: Self-pay | Admitting: *Deleted

## 2014-10-08 MED ORDER — FREESTYLE LANCETS MISC: Status: DC

## 2014-10-09 ENCOUNTER — Other Ambulatory Visit: Payer: Self-pay | Admitting: *Deleted

## 2014-10-09 MED ORDER — OMEPRAZOLE 20 MG PO CPDR: DELAYED_RELEASE_CAPSULE | ORAL | Status: DC

## 2014-10-09 MED ORDER — SIMVASTATIN 40 MG PO TABS: ORAL_TABLET | ORAL | Status: DC

## 2014-10-29 ENCOUNTER — Ambulatory Visit (INDEPENDENT_AMBULATORY_CARE_PROVIDER_SITE_OTHER): Payer: BC Managed Care – PPO | Admitting: Family Medicine

## 2014-10-29 ENCOUNTER — Other Ambulatory Visit: Payer: Self-pay | Admitting: Family Medicine

## 2014-10-29 ENCOUNTER — Encounter: Payer: Self-pay | Admitting: Family Medicine

## 2014-10-29 ENCOUNTER — Ambulatory Visit (INDEPENDENT_AMBULATORY_CARE_PROVIDER_SITE_OTHER): Payer: BC Managed Care – PPO

## 2014-10-29 VITALS — BP 129/72 | HR 71 | Temp 96.6°F | Wt 175.0 lb

## 2014-10-29 DIAGNOSIS — E1122 Type 2 diabetes mellitus with diabetic chronic kidney disease: Secondary | ICD-10-CM

## 2014-10-29 DIAGNOSIS — M1990 Unspecified osteoarthritis, unspecified site: Secondary | ICD-10-CM

## 2014-10-29 DIAGNOSIS — R200 Anesthesia of skin: Secondary | ICD-10-CM

## 2014-10-29 DIAGNOSIS — N189 Chronic kidney disease, unspecified: Secondary | ICD-10-CM

## 2014-10-29 LAB — POCT CBC
Granulocyte percent: 55.7 %G (ref 37–80)
HCT, POC: 40.9 % (ref 37.7–47.9)
Hemoglobin: 13.4 g/dL (ref 12.2–16.2)
Lymph, poc: 1.9 (ref 0.6–3.4)
MCH, POC: 28.5 pg (ref 27–31.2)
MCHC: 32.7 g/dL (ref 31.8–35.4)
MCV: 86.9 fL (ref 80–97)
MPV: 8.2 fL (ref 0–99.8)
POC Granulocyte: 2.7 (ref 2–6.9)
POC LYMPH PERCENT: 40.6 %L (ref 10–50)
Platelet Count, POC: 310 10*3/uL (ref 142–424)
RBC: 4.7 M/uL (ref 4.04–5.48)
RDW, POC: 13.7 %
WBC: 4.8 10*3/uL (ref 4.6–10.2)

## 2014-10-29 LAB — POCT GLYCOSYLATED HEMOGLOBIN (HGB A1C): Hemoglobin A1C: 6.7

## 2014-10-29 MED ORDER — DICLOFENAC SODIUM 75 MG PO TBEC
75.0000 mg | DELAYED_RELEASE_TABLET | Freq: Two times a day (BID) | ORAL | Status: DC
Start: 2014-10-29 — End: 2014-11-12

## 2014-10-29 NOTE — Progress Notes (Signed)
   Subjective:    Patient ID: Renee [name], female    DOB: January 24, 1951, 63 y.o.   MRN: [id number]  HPI Patient is here for follow up.  She has hx of T2DM.  She is due for follow up.  She has been having persistent numbness in her fingers and her hands.  She states when she gets to moving around in the daytime she notices her hands and fingers becoming numb.  Review of Systems  Constitutional: Negative for fever.  HENT: Negative for ear pain.   Eyes: Negative for discharge.  Respiratory: Negative for cough.   Cardiovascular: Negative for chest pain.  Gastrointestinal: Negative for abdominal distention.  Endocrine: Negative for polyuria.  Genitourinary: Negative for difficulty urinating.  Musculoskeletal: Negative for gait problem and neck pain.  Skin: Negative for color change and rash.  Neurological: Positive for numbness. Negative for speech difficulty and headaches.  Psychiatric/Behavioral: Negative for agitation.       Objective:    BP 129/72 mmHg  Pulse 71  Temp(Src) 96.6 F (35.9 C) (Oral)  Wt 175 lb (79.379 kg) Physical Exam  Constitutional: She is oriented to person, place, and time. She appears well-developed and well-nourished.  HENT:  Head: Normocephalic and atraumatic.  Mouth/Throat: Oropharynx is clear and moist.  Eyes: Pupils are equal, round, and reactive to light.  Neck: Normal range of motion. Neck supple.  Cardiovascular: Normal rate and regular rhythm.   No murmur heard. Pulmonary/Chest: Effort normal and breath sounds normal.  Abdominal: Soft. Bowel sounds are normal. There is no tenderness.  Musculoskeletal:  Negative phalen's and tinnel's  Neurological: She is alert and oriented to person, place, and time.  Skin: Skin is warm and dry.  Psychiatric: She has a normal mood and affect.          Assessment & Plan:     ICD-9-CM ICD-10-CM   1. Type 2 diabetes mellitus with diabetic chronic kidney disease 250.40 E11.22 POCT CBC   585.9 N18.9  POCT glycosylated hemoglobin (Hb A1C)     CMP14+EGFR  2. Numbness 782.0 R20.0 DG Cervical Spine Complete     Vitamin B12  3. Arthritis 716.90 M19.90 diclofenac (VOLTAREN) 75 MG EC tablet     No Follow-up on file.  Lysbeth Penner FNP

## 2014-10-30 ENCOUNTER — Telehealth: Payer: Self-pay

## 2014-10-30 LAB — CMP14+EGFR
ALT: 23 IU/L (ref 0–32)
AST: 21 IU/L (ref 0–40)
Albumin/Globulin Ratio: 1.6 (ref 1.1–2.5)
Albumin: 4.5 g/dL (ref 3.6–4.8)
Alkaline Phosphatase: 77 IU/L (ref 39–117)
BUN/Creatinine Ratio: 18 (ref 11–26)
BUN: 16 mg/dL (ref 8–27)
CO2: 22 mmol/L (ref 18–29)
Calcium: 9.9 mg/dL (ref 8.7–10.3)
Chloride: 103 mmol/L (ref 97–108)
Creatinine, Ser: 0.87 mg/dL (ref 0.57–1.00)
GFR calc Af Amer: 81 mL/min/{1.73_m2} (ref >59)
GFR calc non Af Amer: 71 mL/min/{1.73_m2} (ref >59)
Globulin, Total: 2.8 g/dL (ref 1.5–4.5)
Glucose: 113 mg/dL — ABNORMAL HIGH (ref 65–99)
Potassium: 4.3 mmol/L (ref 3.5–5.2)
Sodium: 141 mmol/L (ref 134–144)
Total Bilirubin: 0.4 mg/dL (ref 0.0–1.2)
Total Protein: 7.3 g/dL (ref 6.0–8.5)

## 2014-10-30 LAB — VITAMIN B12: Vitamin B-12: 784 pg/mL (ref 211–946)

## 2014-10-30 NOTE — Telephone Encounter (Signed)
[facility] to X-ray 

## 2014-10-30 NOTE — Telephone Encounter (Signed)
Pt aware of Cervical Spine x-ray results; At this time pt. Is not financially able to have an MRI; she will contact us when she is able to get this done

## 2014-11-12 ENCOUNTER — Other Ambulatory Visit: Payer: Self-pay | Admitting: Family Medicine

## 2014-12-03 ENCOUNTER — Other Ambulatory Visit: Payer: Self-pay | Admitting: General Practice

## 2014-12-05 ENCOUNTER — Other Ambulatory Visit: Payer: Self-pay | Admitting: General Practice

## 2014-12-31 ENCOUNTER — Other Ambulatory Visit: Payer: Self-pay | Admitting: Nurse Practitioner

## 2015-05-06 ENCOUNTER — Other Ambulatory Visit: Payer: Self-pay | Admitting: Family Medicine

## 2015-05-12 ENCOUNTER — Other Ambulatory Visit: Payer: Self-pay | Admitting: Family Medicine

## 2015-06-08 ENCOUNTER — Other Ambulatory Visit: Payer: Self-pay | Admitting: Family Medicine

## 2015-06-09 ENCOUNTER — Other Ambulatory Visit: Payer: Self-pay | Admitting: Family Medicine

## 2015-06-16 ENCOUNTER — Other Ambulatory Visit: Payer: Self-pay | Admitting: Family Medicine

## 2015-06-26 ENCOUNTER — Encounter: Payer: Self-pay | Admitting: Family

## 2015-06-26 ENCOUNTER — Encounter (INDEPENDENT_AMBULATORY_CARE_PROVIDER_SITE_OTHER): Payer: Self-pay

## 2015-06-26 ENCOUNTER — Ambulatory Visit (INDEPENDENT_AMBULATORY_CARE_PROVIDER_SITE_OTHER): Payer: Medicare Other | Admitting: Family

## 2015-06-26 VITALS — BP 121/75 | HR 63 | Temp 97.3°F | Ht 65.5 in | Wt 171.0 lb

## 2015-06-26 DIAGNOSIS — E785 Hyperlipidemia, unspecified: Secondary | ICD-10-CM | POA: Diagnosis not present

## 2015-06-26 DIAGNOSIS — K2100 Gastro-esophageal reflux disease with esophagitis, without bleeding: Secondary | ICD-10-CM

## 2015-06-26 DIAGNOSIS — I10 Essential (primary) hypertension: Secondary | ICD-10-CM

## 2015-06-26 DIAGNOSIS — E119 Type 2 diabetes mellitus without complications: Secondary | ICD-10-CM

## 2015-06-26 LAB — POCT GLYCOSYLATED HEMOGLOBIN (HGB A1C): Hemoglobin A1C: 7.6

## 2015-06-26 LAB — POCT UA - MICROALBUMIN: MICROALBUMIN (UR) POC: 50 mg/L

## 2015-06-26 MED ORDER — SIMVASTATIN 40 MG PO TABS: 40.0000 mg | ORAL_TABLET | Freq: Every day | ORAL | Status: DC

## 2015-06-26 MED ORDER — OMEPRAZOLE 20 MG PO CPDR
20.0000 mg | DELAYED_RELEASE_CAPSULE | Freq: Every day | ORAL | Status: DC
Start: 2015-06-26 — End: 2016-07-09

## 2015-06-26 MED ORDER — BISOPROLOL-HYDROCHLOROTHIAZIDE 2.5-6.25 MG PO TABS: 1.0000 | ORAL_TABLET | Freq: Every day | ORAL | Status: DC

## 2015-06-26 NOTE — Addendum Note (Signed)
Addended by: Earlene Plater on: 06/26/2015 12:30 PM   Modules accepted: Orders

## 2015-06-26 NOTE — Progress Notes (Signed)
Subjective:    Patient ID: Renee [name], female    DOB: [birth year], 64 y.o.   MRN: [id number]  Diabetes She presents for her follow-up diabetic visit. She has type 2 diabetes mellitus. Her disease course has been stable. There are no hypoglycemic associated symptoms. Pertinent negatives for hypoglycemia include no headaches. Pertinent negatives for diabetes include no blurred vision, no foot paresthesias and no visual change. There are no hypoglycemic complications. Symptoms are stable. Diabetic complications include a CVA and peripheral neuropathy. Pertinent negatives for diabetic complications include no heart disease or nephropathy. Risk factors for coronary artery disease include family history, dyslipidemia, diabetes mellitus, hypertension, obesity, sedentary lifestyle and post-menopausal. Current diabetic treatment includes diet. She is following a generally healthy diet. Her breakfast blood glucose range is generally 140-180 mg/dl. An ACE inhibitor/angiotensin II receptor blocker is being taken. Eye exam is not current.  Hypertension This is a chronic problem. The current episode started more than 1 year ago. The problem has been resolved since onset. The problem is controlled. Pertinent negatives include no blurred vision, headaches, palpitations, peripheral edema or shortness of breath. Risk factors for coronary artery disease include dyslipidemia, family history, diabetes mellitus, obesity, post-menopausal state and sedentary lifestyle. Past treatments include beta blockers and diuretics. The current treatment provides significant improvement. Hypertensive end-organ damage includes CVA. There is no history of kidney disease, CAD/MI, heart failure or a thyroid problem. There is no history of sleep apnea.  Hyperlipidemia This is a chronic problem. The current episode started more than 1 year ago. The problem is controlled. Recent lipid tests were reviewed and are normal. Exacerbating  diseases include diabetes. She has no history of hypothyroidism. Pertinent negatives include no shortness of breath. Current antihyperlipidemic treatment includes statins. The current treatment provides moderate improvement of lipids. Risk factors for coronary artery disease include diabetes mellitus, dyslipidemia, family history, hypertension, obesity and post-menopausal.  Gastrophageal Reflux She reports no belching, no coughing or no heartburn. This is a chronic problem. The current episode started more than 1 year ago. The problem occurs rarely. The problem has been resolved. The symptoms are aggravated by certain foods. She has tried a PPI for the symptoms. The treatment provided significant relief.      Review of Systems  Constitutional: Negative.   HENT: Negative.   Eyes: Negative.  Negative for blurred vision.  Respiratory: Negative.  Negative for cough and shortness of breath.   Cardiovascular: Negative.  Negative for palpitations.  Gastrointestinal: Negative.  Negative for heartburn.  Endocrine: Negative.   Genitourinary: Negative.   Musculoskeletal: Negative.   Neurological: Negative.  Negative for headaches.  Hematological: Negative.   Psychiatric/Behavioral: Negative.   All other systems reviewed and are negative.      Objective:   Physical Exam  Constitutional: She is oriented to person, place, and time. She appears well-developed and well-nourished. No distress.  HENT:  Head: Normocephalic and atraumatic.  Right Ear: External ear normal.  Left Ear: External ear normal.  Nose: Nose normal.  Mouth/Throat: Oropharynx is clear and moist.  Eyes: Pupils are equal, round, and reactive to light.  Neck: Normal range of motion. Neck supple. No thyromegaly present.  Cardiovascular: Normal rate, regular rhythm, normal heart sounds and intact distal pulses.   No murmur heard. Pulmonary/Chest: Effort normal and breath sounds normal. No respiratory distress. She has no wheezes.    Abdominal: Soft. Bowel sounds are normal. She exhibits no distension. There is no tenderness.  Musculoskeletal: Normal range of motion. She exhibits no  edema or tenderness.  Neurological: She is alert and oriented to person, place, and time. She has normal reflexes. No cranial nerve deficit.  Skin: Skin is warm and dry.  Psychiatric: She has a normal mood and affect. Her behavior is normal. Judgment and thought content normal.  Vitals reviewed.  See Diabetic foot note   BP 121/75 mmHg  Pulse 63  Temp(Src) 97.3 F (36.3 C) (Oral)  Ht 5' 5.5" (1.664 m)  Wt 171 lb (77.565 kg)  BMI 28.01 kg/m2     Assessment & Plan:  1. Essential hypertension - CMP14+EGFR - bisoprolol-hydrochlorothiazide (ZIAC) 2.5-6.25 MG per tablet; Take 1 tablet by mouth daily.  Dispense: 90 tablet; Refill: 3  2. Gastroesophageal reflux disease with esophagitis - CMP14+EGFR - omeprazole (PRILOSEC) 20 MG capsule; Take 1 capsule (20 mg total) by mouth daily.  Dispense: 90 capsule; Refill: 3  3. Type 2 diabetes mellitus without complication - POCT glycosylated hemoglobin (Hb A1C) - CMP14+EGFR - POCT UA - Microalbumin  4. HLD (hyperlipidemia) - CMP14+EGFR - Lipid panel - simvastatin (ZOCOR) 40 MG tablet; Take 1 tablet (40 mg total) by mouth at bedtime.  Dispense: 90 tablet; Refill: 1   Continue all meds Labs pending Health Maintenance reviewed Diet and exercise encouraged RTO 6 months  Evelina Dun, FNP

## 2015-06-26 NOTE — Patient Instructions (Signed)

## 2015-06-27 LAB — MICROALBUMIN, URINE: MICROALBUM., U, RANDOM: 25.5 ug/mL

## 2015-06-27 LAB — CMP14+EGFR
A/G RATIO: 1.6 (ref 1.1–2.5)
ALBUMIN: 4.4 g/dL (ref 3.6–4.8)
ALK PHOS: 78 IU/L (ref 39–117)
ALT: 21 IU/L (ref 0–32)
AST: 24 IU/L (ref 0–40)
BUN / CREAT RATIO: 18 (ref 11–26)
BUN: 15 mg/dL (ref 8–27)
Bilirubin Total: 0.4 mg/dL (ref 0.0–1.2)
CHLORIDE: 105 mmol/L (ref 97–108)
CO2: 22 mmol/L (ref 18–29)
CREATININE: 0.85 mg/dL (ref 0.57–1.00)
Calcium: 9.9 mg/dL (ref 8.7–10.3)
GFR calc Af Amer: 83 mL/min/{1.73_m2} (ref >59)
GFR, EST NON AFRICAN AMERICAN: 72 mL/min/{1.73_m2} (ref >59)
Globulin, Total: 2.8 g/dL (ref 1.5–4.5)
Glucose: 111 mg/dL — ABNORMAL HIGH (ref 65–99)
Potassium: 4.5 mmol/L (ref 3.5–5.2)
SODIUM: 143 mmol/L (ref 134–144)
Total Protein: 7.2 g/dL (ref 6.0–8.5)

## 2015-06-27 LAB — LIPID PANEL
CHOL/HDL RATIO: 3.9 ratio (ref 0.0–4.4)
Cholesterol, Total: 198 mg/dL (ref 100–199)
HDL: 51 mg/dL (ref >39)
LDL CALC: 128 mg/dL — AB (ref 0–99)
Triglycerides: 97 mg/dL (ref 0–149)
VLDL Cholesterol Cal: 19 mg/dL (ref 5–40)

## 2015-07-01 ENCOUNTER — Telehealth: Payer: Self-pay | Admitting: *Deleted

## 2015-07-01 NOTE — Telephone Encounter (Signed)
-----   Message from [facility] [place] sent at 06/28/2015  9:03 AM EDT ----- microalbumin normal hgba1c could be better- strict carbs counting Kidney and liver function stable cholesteroll ooks ok Continue current meds- low fat diet and exercise and recheck in 3 months

## 2015-07-30 ENCOUNTER — Encounter: Payer: Self-pay | Admitting: *Deleted

## 2015-09-22 LAB — HM DIABETES EYE EXAM

## 2015-12-26 ENCOUNTER — Encounter: Payer: Self-pay | Admitting: Family

## 2015-12-26 ENCOUNTER — Ambulatory Visit (INDEPENDENT_AMBULATORY_CARE_PROVIDER_SITE_OTHER): Payer: Medicare Other

## 2015-12-26 ENCOUNTER — Other Ambulatory Visit: Payer: Self-pay | Admitting: Family

## 2015-12-26 ENCOUNTER — Ambulatory Visit (INDEPENDENT_AMBULATORY_CARE_PROVIDER_SITE_OTHER): Payer: Medicare Other | Admitting: Family

## 2015-12-26 VITALS — BP 117/76 | HR 67 | Temp 97.2°F | Ht 65.5 in | Wt 168.6 lb

## 2015-12-26 DIAGNOSIS — E119 Type 2 diabetes mellitus without complications: Secondary | ICD-10-CM

## 2015-12-26 DIAGNOSIS — Z1159 Encounter for screening for other viral diseases: Secondary | ICD-10-CM | POA: Diagnosis not present

## 2015-12-26 DIAGNOSIS — Z1231 Encounter for screening mammogram for malignant neoplasm of breast: Secondary | ICD-10-CM | POA: Diagnosis not present

## 2015-12-26 DIAGNOSIS — I10 Essential (primary) hypertension: Secondary | ICD-10-CM

## 2015-12-26 DIAGNOSIS — Z8673 Personal history of transient ischemic attack (TIA), and cerebral infarction without residual deficits: Secondary | ICD-10-CM | POA: Diagnosis not present

## 2015-12-26 DIAGNOSIS — Z780 Asymptomatic menopausal state: Secondary | ICD-10-CM | POA: Diagnosis not present

## 2015-12-26 DIAGNOSIS — E785 Hyperlipidemia, unspecified: Secondary | ICD-10-CM

## 2015-12-26 DIAGNOSIS — K2100 Gastro-esophageal reflux disease with esophagitis, without bleeding: Secondary | ICD-10-CM

## 2015-12-26 LAB — HM MAMMOGRAPHY

## 2015-12-26 LAB — POCT GLYCOSYLATED HEMOGLOBIN (HGB A1C)

## 2015-12-26 NOTE — Patient Instructions (Signed)
Health Maintenance, Female Adopting a healthy lifestyle and getting preventive care can go a long way to promote health and wellness. Talk with your health care provider about what schedule of regular examinations is right for you. This is a good chance for you to check in with your provider about disease prevention and staying healthy. In between checkups, there are plenty of things you can do on your own. Experts have done a lot of research about which lifestyle changes and preventive measures are most likely to keep you healthy. Ask your health care provider for more information. WEIGHT AND DIET  Eat a healthy diet  Be sure to include plenty of vegetables, fruits, low-fat dairy products, and lean protein.  Do not eat a lot of foods high in solid fats, added sugars, or salt.  Get regular exercise. This is one of the most important things you can do for your health.  Most adults should exercise for at least 150 minutes each week. The exercise should increase your heart rate and make you sweat (moderate-intensity exercise).  Most adults should also do strengthening exercises at least twice a week. This is in addition to the moderate-intensity exercise.  Maintain a healthy weight  Body mass index (BMI) is a measurement that can be used to identify possible weight problems. It estimates body fat based on height and weight. Your health care provider can help determine your BMI and help you achieve or maintain a healthy weight.  For females 20 years of age and older:   A BMI below 18.5 is considered underweight.  A BMI of 18.5 to 24.9 is normal.  A BMI of 25 to 29.9 is considered overweight.  A BMI of 30 and above is considered obese.  Watch levels of cholesterol and blood lipids  You should start having your blood tested for lipids and cholesterol at 65 years of age, then have this test every 5 years.  You may need to have your cholesterol levels checked more often if:  Your lipid  or cholesterol levels are high.  You are older than 65 years of age.  You are at high risk for heart disease.  CANCER SCREENING   Lung Cancer  Lung cancer screening is recommended for adults 55-80 years old who are at high risk for lung cancer because of a history of smoking.  A yearly low-dose CT scan of the lungs is recommended for people who:  Currently smoke.  Have quit within the past 15 years.  Have at least a 30-pack-year history of smoking. A pack year is smoking an average of one pack of cigarettes a day for 1 year.  Yearly screening should continue until it has been 15 years since you quit.  Yearly screening should stop if you develop a health problem that would prevent you from having lung cancer treatment.  Breast Cancer  Practice breast self-awareness. This means understanding how your breasts normally appear and feel.  It also means doing regular breast self-exams. Let your health care provider know about any changes, no matter how small.  If you are in your 20s or 30s, you should have a clinical breast exam (CBE) by a health care provider every 1-3 years as part of a regular health exam.  If you are 40 or older, have a CBE every year. Also consider having a breast X-ray (mammogram) every year.  If you have a family history of breast cancer, talk to your health care provider about genetic screening.  If you   are at high risk for breast cancer, talk to your health care provider about having an MRI and a mammogram every year.  Breast cancer gene (BRCA) assessment is recommended for women who have family members with BRCA-related cancers. BRCA-related cancers include:  Breast.  Ovarian.  Tubal.  Peritoneal cancers.  Results of the assessment will determine the need for genetic counseling and BRCA1 and BRCA2 testing. Cervical Cancer Your health care provider may recommend that you be screened regularly for cancer of the pelvic organs (ovaries, uterus, and  vagina). This screening involves a pelvic examination, including checking for microscopic changes to the surface of your cervix (Pap test). You may be encouraged to have this screening done every 3 years, beginning at age 21.  For women ages 30-65, health care providers may recommend pelvic exams and Pap testing every 3 years, or they may recommend the Pap and pelvic exam, combined with testing for human papilloma virus (HPV), every 5 years. Some types of HPV increase your risk of cervical cancer. Testing for HPV may also be done on women of any age with unclear Pap test results.  Other health care providers may not recommend any screening for nonpregnant women who are considered low risk for pelvic cancer and who do not have symptoms. Ask your health care provider if a screening pelvic exam is right for you.  If you have had past treatment for cervical cancer or a condition that could lead to cancer, you need Pap tests and screening for cancer for at least 20 years after your treatment. If Pap tests have been discontinued, your risk factors (such as having a new sexual partner) need to be reassessed to determine if screening should resume. Some women have medical problems that increase the chance of getting cervical cancer. In these cases, your health care provider may recommend more frequent screening and Pap tests. Colorectal Cancer  This type of cancer can be detected and often prevented.  Routine colorectal cancer screening usually begins at 65 years of age and continues through 65 years of age.  Your health care provider may recommend screening at an earlier age if you have risk factors for colon cancer.  Your health care provider may also recommend using home test kits to check for hidden blood in the stool.  A small camera at the end of a tube can be used to examine your colon directly (sigmoidoscopy or colonoscopy). This is done to check for the earliest forms of colorectal  cancer.  Routine screening usually begins at age 50.  Direct examination of the colon should be repeated every 5-10 years through 65 years of age. However, you may need to be screened more often if early forms of precancerous polyps or small growths are found. Skin Cancer  Check your skin from head to toe regularly.  Tell your health care provider about any new moles or changes in moles, especially if there is a change in a mole's shape or color.  Also tell your health care provider if you have a mole that is larger than the size of a pencil eraser.  Always use sunscreen. Apply sunscreen liberally and repeatedly throughout the day.  Protect yourself by wearing long sleeves, pants, a wide-brimmed hat, and sunglasses whenever you are outside. HEART DISEASE, DIABETES, AND HIGH BLOOD PRESSURE   High blood pressure causes heart disease and increases the risk of stroke. High blood pressure is more likely to develop in:  People who have blood pressure in the high end   of the normal range (130-139/85-89 mm Hg).  People who are overweight or obese.  People who are African American.  If you are 38-23 years of age, have your blood pressure checked every 3-5 years. If you are 61 years of age or older, have your blood pressure checked every year. You should have your blood pressure measured twice--once when you are at a hospital or clinic, and once when you are not at a hospital or clinic. Record the average of the two measurements. To check your blood pressure when you are not at a hospital or clinic, you can use:  An automated blood pressure machine at a pharmacy.  A home blood pressure monitor.  If you are between 45 years and 39 years old, ask your health care provider if you should take aspirin to prevent strokes.  Have regular diabetes screenings. This involves taking a blood sample to check your fasting blood sugar level.  If you are at a normal weight and have a low risk for diabetes,  have this test once every three years after 65 years of age.  If you are overweight and have a high risk for diabetes, consider being tested at a younger age or more often. PREVENTING INFECTION  Hepatitis B  If you have a higher risk for hepatitis B, you should be screened for this virus. You are considered at high risk for hepatitis B if:  You were born in a country where hepatitis B is common. Ask your health care provider which countries are considered high risk.  Your parents were born in a high-risk country, and you have not been immunized against hepatitis B (hepatitis B vaccine).  You have HIV or AIDS.  You use needles to inject street drugs.  You live with someone who has hepatitis B.  You have had sex with someone who has hepatitis B.  You get hemodialysis treatment.  You take certain medicines for conditions, including cancer, organ transplantation, and autoimmune conditions. Hepatitis C  Blood testing is recommended for:  Everyone born from [birth year] through 1966.  Anyone with known risk factors for hepatitis C. Sexually transmitted infections (STIs)  You should be screened for sexually transmitted infections (STIs) including gonorrhea and chlamydia if:  You are sexually active and are younger than 65 years of age.  You are older than 65 years of age and your health care provider tells you that you are at risk for this type of infection.  Your sexual activity has changed since you were last screened and you are at an increased risk for chlamydia or gonorrhea. Ask your health care provider if you are at risk.  If you do not have HIV, but are at risk, it may be recommended that you take a prescription medicine daily to prevent HIV infection. This is called pre-exposure prophylaxis (PrEP). You are considered at risk if:  You are sexually active and do not regularly use condoms or know the HIV status of your partner(s).  You take drugs by injection.  You are sexually  active with a partner who has HIV. Talk with your health care provider about whether you are at high risk of being infected with HIV. If you choose to begin PrEP, you should first be tested for HIV. You should then be tested every 3 months for as long as you are taking PrEP.  PREGNANCY   If you are premenopausal and you may become pregnant, ask your health care provider about preconception counseling.  If you may  become pregnant, take 400 to 800 micrograms (mcg) of folic acid every day.  If you want to prevent pregnancy, talk to your health care provider about birth control (contraception). OSTEOPOROSIS AND MENOPAUSE   Osteoporosis is a disease in which the bones lose minerals and strength with aging. This can result in serious bone fractures. Your risk for osteoporosis can be identified using a bone density scan.  If you are 61 years of age or older, or if you are at risk for osteoporosis and fractures, ask your health care provider if you should be screened.  Ask your health care provider whether you should take a calcium or vitamin D supplement to lower your risk for osteoporosis.  Menopause may have certain physical symptoms and risks.  Hormone replacement therapy may reduce some of these symptoms and risks. Talk to your health care provider about whether hormone replacement therapy is right for you.  HOME CARE INSTRUCTIONS   Schedule regular health, dental, and eye exams.  Stay current with your immunizations.   Do not use any tobacco products including cigarettes, chewing tobacco, or electronic cigarettes.  If you are pregnant, do not drink alcohol.  If you are breastfeeding, limit how much and how often you drink alcohol.  Limit alcohol intake to no more than 1 drink per day for nonpregnant women. One drink equals 12 ounces of beer, 5 ounces of wine, or 1 ounces of hard liquor.  Do not use street drugs.  Do not share needles.  Ask your health care provider for help if  you need support or information about quitting drugs.  Tell your health care provider if you often feel depressed.  Tell your health care provider if you have ever been abused or do not feel safe at home.   This information is not intended to replace advice given to you by your health care provider. Make sure you discuss any questions you have with your health care provider.   Document Released: 06/28/2011 Document Revised: 01/03/2015 Document Reviewed: 11/14/2013 Elsevier Interactive Patient Education [employer].

## 2015-12-26 NOTE — Progress Notes (Signed)
Subjective:    Patient ID: Renee [name], female    DOB: 09-Aug-1951, 64 y.o.   MRN: [id number]   Pt presents to the office today for chronic follow up.  Diabetes She presents for her follow-up diabetic visit. She has type 2 diabetes mellitus. Her disease course has been stable. There are no hypoglycemic associated symptoms. Pertinent negatives for hypoglycemia include no headaches. Pertinent negatives for diabetes include no blurred vision, no foot paresthesias and no visual change. There are no hypoglycemic complications. Symptoms are stable. Diabetic complications include a CVA and peripheral neuropathy. Pertinent negatives for diabetic complications include no heart disease or nephropathy. Risk factors for coronary artery disease include family history, dyslipidemia, diabetes mellitus, hypertension, obesity, sedentary lifestyle and post-menopausal. Current diabetic treatment includes diet. She is following a generally healthy diet. Her breakfast blood glucose range is generally 140-180 mg/dl. An ACE inhibitor/angiotensin II receptor blocker is being taken. Eye exam is current.  Hypertension This is a chronic problem. The current episode started more than 1 year ago. The problem has been resolved since onset. The problem is controlled. Pertinent negatives include no blurred vision, headaches, palpitations, peripheral edema or shortness of breath. Risk factors for coronary artery disease include dyslipidemia, family history, diabetes mellitus, obesity, post-menopausal state and sedentary lifestyle. Past treatments include beta blockers and diuretics. The current treatment provides significant improvement. Hypertensive end-organ damage includes CVA. There is no history of kidney disease, CAD/MI, heart failure or a thyroid problem. There is no history of sleep apnea.  Hyperlipidemia This is a chronic problem. The current episode started more than 1 year ago. The problem is uncontrolled. Recent lipid  tests were reviewed and are high. Exacerbating diseases include diabetes. She has no history of hypothyroidism. Pertinent negatives include no shortness of breath. Current antihyperlipidemic treatment includes statins. The current treatment provides moderate improvement of lipids. Risk factors for coronary artery disease include diabetes mellitus, dyslipidemia, family history, hypertension, obesity and post-menopausal.  Gastroesophageal Reflux She reports no belching, no coughing or no heartburn. This is a chronic problem. The current episode started more than 1 year ago. The problem occurs rarely. The problem has been resolved. The symptoms are aggravated by certain foods. She has tried a PPI for the symptoms. The treatment provided significant relief.      Review of Systems  Constitutional: Negative.   HENT: Negative.   Eyes: Negative.  Negative for blurred vision.  Respiratory: Negative.  Negative for cough and shortness of breath.   Cardiovascular: Negative.  Negative for palpitations.  Gastrointestinal: Negative.  Negative for heartburn.  Endocrine: Negative.   Genitourinary: Negative.   Musculoskeletal: Negative.   Neurological: Negative.  Negative for headaches.  Hematological: Negative.   Psychiatric/Behavioral: Negative.   All other systems reviewed and are negative.      Objective:   Physical Exam  Constitutional: She is oriented to person, place, and time. She appears well-developed and well-nourished. No distress.  HENT:  Head: Normocephalic and atraumatic.  Right Ear: External ear normal.  Left Ear: External ear normal.  Nose: Nose normal.  Mouth/Throat: Oropharynx is clear and moist.  Eyes: Pupils are equal, round, and reactive to light.  Neck: Normal range of motion. Neck supple. No thyromegaly present.  Cardiovascular: Normal rate, regular rhythm, normal heart sounds and intact distal pulses.   No murmur heard. Pulmonary/Chest: Effort normal and breath sounds  normal. No respiratory distress. She has no wheezes.  Abdominal: Soft. Bowel sounds are normal. She exhibits no distension. There is no  tenderness.  Musculoskeletal: Normal range of motion. She exhibits no edema or tenderness.  Neurological: She is alert and oriented to person, place, and time. She has normal reflexes. No cranial nerve deficit.  Skin: Skin is warm and dry.  Psychiatric: She has a normal mood and affect. Her behavior is normal. Judgment and thought content normal.  Vitals reviewed.     BP 117/76 mmHg  Pulse 67  Temp(Src) 97.2 F (36.2 C) (Oral)  Ht 5' 5.5" (1.664 m)  Wt 168 lb 9.6 oz (76.476 kg)  BMI 27.62 kg/m2     Assessment & Plan:  1. Essential hypertension - CMP14+EGFR  2. Gastroesophageal reflux disease with esophagitis - CMP14+EGFR  3. HLD (hyperlipidemia) - CMP14+EGFR - Lipid panel  4. History of CVA (cerebrovascular accident) - CMP14+EGFR  5. Type 2 diabetes mellitus without complication, without long-term current use of insulin (HCC) - POCT glycosylated hemoglobin (Hb A1C) - CMP14+EGFR FreeStyle Lite meter given to patient today  6. Post-menopausal - CMP14+EGFR - VITAMIN D 25 Hydroxy (Vit-D Deficiency, Fractures) - DG Bone Density; Future   Continue all meds Labs pending Health Maintenance reviewed Diet and exercise encouraged RTO 6 months  Evelina Dun, FNP

## 2015-12-26 NOTE — Addendum Note (Signed)
Addended by: Evelina Dun A on: 12/26/2015 12:36 PM   Modules accepted: Orders

## 2015-12-27 LAB — LIPID PANEL
CHOL/HDL RATIO: 3.3 ratio (ref 0.0–4.4)
Cholesterol, Total: 153 mg/dL (ref 100–199)
HDL: 47 mg/dL (ref >39)
LDL Calculated: 77 mg/dL (ref 0–99)
TRIGLYCERIDES: 147 mg/dL (ref 0–149)
VLDL CHOLESTEROL CAL: 29 mg/dL (ref 5–40)

## 2015-12-27 LAB — CMP14+EGFR
A/G RATIO: 1.5 (ref 1.1–2.5)
ALT: 31 IU/L (ref 0–32)
AST: 28 IU/L (ref 0–40)
Albumin: 4.3 g/dL (ref 3.6–4.8)
Alkaline Phosphatase: 109 IU/L (ref 39–117)
BUN/Creatinine Ratio: 14 (ref 11–26)
BUN: 13 mg/dL (ref 8–27)
Bilirubin Total: 0.5 mg/dL (ref 0.0–1.2)
CALCIUM: 10.3 mg/dL (ref 8.7–10.3)
CO2: 22 mmol/L (ref 18–29)
Chloride: 95 mmol/L — ABNORMAL LOW (ref 96–106)
Creatinine, Ser: 0.96 mg/dL (ref 0.57–1.00)
GFR calc non Af Amer: 62 mL/min/{1.73_m2} (ref >59)
GFR, EST AFRICAN AMERICAN: 72 mL/min/{1.73_m2} (ref >59)
GLUCOSE: 372 mg/dL — AB (ref 65–99)
Globulin, Total: 2.8 g/dL (ref 1.5–4.5)
POTASSIUM: 4.9 mmol/L (ref 3.5–5.2)
Sodium: 136 mmol/L (ref 134–144)
TOTAL PROTEIN: 7.1 g/dL (ref 6.0–8.5)

## 2015-12-27 LAB — VITAMIN D 25 HYDROXY (VIT D DEFICIENCY, FRACTURES): VIT D 25 HYDROXY: 29.3 ng/mL — AB (ref 30.0–100.0)

## 2015-12-30 LAB — SPECIMEN STATUS REPORT

## 2015-12-30 LAB — HEPATITIS C ANTIBODY: Hep C Virus Ab: 0.1 s/co ratio (ref 0.0–0.9)

## 2015-12-31 ENCOUNTER — Other Ambulatory Visit: Payer: Self-pay | Admitting: Family

## 2015-12-31 MED ORDER — INSULIN GLARGINE 100 UNIT/ML ~~LOC~~ SOLN: 12.0000 [IU] | Freq: Every day | SUBCUTANEOUS | Status: DC

## 2015-12-31 MED ORDER — VITAMIN D (ERGOCALCIFEROL) 1.25 MG (50000 UNIT) PO CAPS: 50000.0000 [IU] | ORAL_CAPSULE | ORAL | Status: DC

## 2015-12-31 NOTE — Progress Notes (Signed)
Quick Note:  HgbA1C extremely elevated- Pt needs to be on low carb diet, Lantus Prescription sent to pharmacy- PT started on 12 units every night, PT needs appt with Tammy asap Kidney and liver function stable Cholesterol levels WNL Hepatitis C negative Vit D levels low-Prescription sent to pharmacy   ______

## 2016-01-05 ENCOUNTER — Encounter: Payer: Self-pay | Admitting: Pharmacist

## 2016-01-05 ENCOUNTER — Ambulatory Visit (INDEPENDENT_AMBULATORY_CARE_PROVIDER_SITE_OTHER): Payer: Medicare Other | Admitting: Pharmacist

## 2016-01-05 VITALS — BP 128/74 | HR 71 | Ht 66.0 in | Wt 168.0 lb

## 2016-01-05 DIAGNOSIS — E119 Type 2 diabetes mellitus without complications: Secondary | ICD-10-CM

## 2016-01-05 DIAGNOSIS — Z794 Long term (current) use of insulin: Secondary | ICD-10-CM | POA: Diagnosis not present

## 2016-01-05 MED ORDER — INSULIN GLARGINE 100 UNIT/ML SOLOSTAR PEN: PEN_INJECTOR | SUBCUTANEOUS | Status: DC

## 2016-01-05 MED ORDER — ONETOUCH DELICA LANCETS 33G MISC: Status: DC

## 2016-01-05 MED ORDER — ONETOUCH VERIO VI SOLN: Status: DC

## 2016-01-05 MED ORDER — GLUCOSE BLOOD VI STRP: ORAL_STRIP | Status: DC

## 2016-01-05 MED ORDER — INSULIN PEN NEEDLE 32G X 4 MM MISC: Status: DC

## 2016-01-05 NOTE — Patient Instructions (Addendum)
Start Basaglar Insulin - inject 5 units once a day in the morning.  If after 3 days (January 12th, 2017) your blood glucose / sugar is still over 200 then increase to 8 units once a day.  If after 3 more days (January 15th, 2017) your blood glucose / sugar is still over 200 then increase to 10 units once a day.  If after 3 more days (January 19th, 2017) blood glucose / sugar is over 200 then increase to 12 units once a day.   Diabetes and Standards of Medical Care   Diabetes is complicated. You may find that your diabetes team includes a dietitian, nurse, diabetes educator, eye doctor, and more. To help everyone know what is going on and to help you get the care you deserve, the following schedule of care was developed to help keep you on track. Below are the tests, exams, vaccines, medicines, education, and plans you will need.  Blood Glucose Goals Prior to meals = 80 - 130 Within 2 hours of the start of a meal = less than 180  HbA1c test (goal is less than 7.0% - your last value was over 14%) This test shows how well you have controlled your glucose over the past 2 to 3 months. It is used to see if your diabetes management plan needs to be adjusted.   It is performed at least 2 times a year if you are meeting treatment goals.  It is performed 4 times a year if therapy has changed or if you are not meeting treatment goals.  Blood pressure test  This test is performed at every routine medical visit. The goal is less than 140/90 mmHg for most people, but 130/80 mmHg in some cases. Ask your health care provider about your goal.  Dental exam  Follow up with the dentist regularly.  Eye exam  If you are diagnosed with type 1 diabetes as a child, get an exam upon reaching the age of 60 years or older and have had diabetes for 3 to 5 years. Yearly eye exams are recommended after that initial eye exam.  If you are diagnosed with type 1 diabetes as an adult, get an exam within 5 years of  diagnosis and then yearly.  If you are diagnosed with type 2 diabetes, get an exam as soon as possible after the diagnosis and then yearly.  Foot care exam  Visual foot exams are performed at every routine medical visit. The exams check for cuts, injuries, or other problems with the feet.  A comprehensive foot exam should be done yearly. This includes visual inspection as well as assessing foot pulses and testing for loss of sensation.  Check your feet nightly for cuts, injuries, or other problems with your feet. Tell your health care provider if anything is not healing.  Kidney function test (urine microalbumin)  This test is performed once a year.  Type 1 diabetes: The first test is performed 5 years after diagnosis.  Type 2 diabetes: The first test is performed at the time of diagnosis.  A serum creatinine and estimated glomerular filtration rate (eGFR) test is done once a year to assess the level of chronic kidney disease (CKD), if present.  Lipid profile (cholesterol, HDL, LDL, triglycerides)  Performed every 5 years for most people.  The goal for LDL is less than 100 mg/dL. If you are at high risk, the goal is less than 70 mg/dL.  The goal for HDL is 40 mg/dL to 50  mg/dL for men and 50 mg/dL to 60 mg/dL for women. An HDL cholesterol of 60 mg/dL or higher gives some protection against heart disease.  The goal for triglycerides is less than 150 mg/dL.  Influenza vaccine, pneumococcal vaccine, and hepatitis B vaccine  The influenza vaccine is recommended yearly.  The pneumococcal vaccine is generally given once in a lifetime. However, there are some instances when another vaccination is recommended. Check with your health care provider.  The hepatitis B vaccine is also recommended for adults with diabetes.  Diabetes self-management education  Education is recommended at diagnosis and ongoing as needed.  Treatment plan  Your treatment plan is reviewed at every medical  visit.  Document Released: 10/10/2009 Document Revised: 08/15/2013 Document Reviewed: 05/15/2013 [facility] Patient Information 2014 Start.

## 2016-01-05 NOTE — Progress Notes (Signed)
Subjective:    Renee [name] is a 65 y.o. female who presents for an initial evaluation of Type 2 diabetes mellitus.  She was diagnosed with type 2 DM about 2 years ago.   Current symptoms/problems include hyperglycemia and polydipsia and have been worsening.  Patients A1c on 12/26/2015 was greater than 14%.    She current is not taking any medications for diabetes although she was prescribed Lantus on 12/26/2015.  She is afraid of all diabetes medications because of past reaction to metformin in 2014.  After 2 to 3 doses of metformin 1000mg  bid patient began to blackout and have seizures.  Metformin was subsequently discontinued and she has not had problems since.    Known diabetic complications: none Cardiovascular risk factors: advanced age (older than 38 for men, 25 for women), diabetes mellitus, dyslipidemia, hypertension and sedentary lifestyle  Eye exam current (within one year): yes Weight trend: stable Prior visit with CDE: yes - 2015 Current diet: not currently counting CHO as she has in past Current exercise: none  Current monitoring regimen: home blood tests - one times daily but she just started.  She has a Freestyle lite glucometer but it appears that for 2017 test strips for this meter are not covered by [facility] blood sugar records: only 2 readings - 343 and 371 Any episodes of hypoglycemia? no  Is She on ACE inhibitor or angiotensin II receptor blocker?  No   The following portions of the patient's history were reviewed and updated as appropriate: allergies, current medications, past family history, past medical history, past surgical history and problem list.    Objective:    BP 128/74 mmHg  Pulse 71  Ht 5\' 6"  (1.676 m)  Wt 168 lb (76.204 kg)  BMI 27.13 kg/m2   A1c = greater than 14% (12/26/2015) RBG in office today was 299  Lab Review GLUCOSE (mg/dL)  Date Value  12/26/2015 372*  06/26/2015 111*  10/29/2014 113*   GLUCOSE, BLD (mg/dL)  Date Value   12/31/2013 186*   CO2 (mmol/L)  Date Value  12/26/2015 22  06/26/2015 22  10/29/2014 22   BUN (mg/dL)  Date Value  12/26/2015 13  06/26/2015 15  10/29/2014 16  12/31/2013 23   CREATININE, SER (mg/dL)  Date Value  12/26/2015 0.96  06/26/2015 0.85  10/29/2014 0.87    Assessment:    Diabetes Mellitus type II, under inadequate control.   Compliance is estimated to be poor currently but I feel this will improved with better understanding of diabetes and education about diabetes medications. Plan:    1.  Rx changes: Lantus is non preferred.  Changed to [employer].  SInce patient is very concerned about side effects of all diabetes medications will start with low dose and titrate up slowly.             Start Basaglar Insulin - inject 5 units once a day in the morning.  If after 3 days (January 12th, 2017) your blood glucose / sugar is still over 200 then increase to 8 units once a day.  If after 3 more days (January 15th, 2017) your blood glucose / sugar is still over 200 then increase to 10 units once a day.  If after 3 more days (January 19th, 2017) blood glucose / sugar is over 200 then increase to 12 units once a day.  2.  Instructed on proper injection technique, site selection, proper storage of insulin and first dose of Basaglar was administered in office  by patient 3.  Reviewed CHO counting diet and serving size recommendations 4.  New glucometer given and demonstrated how to use.  Patient is instructed to test one to two times daily.  Currently have set patient's BG goal to less than 200 but will gradually lower that to ADA recommended goals over the next few weeks.    Education: Reviewed 'ABCs' of diabetes management (respective goals in parentheses):  A1C (<7), blood pressure (<130/80), and cholesterol (LDL <100). 5.  Discussed ways to increase physical activity. 6.   Follow up: 3 weeks    Cherre Robins, PharmD, CPP, CDE

## 2016-01-09 ENCOUNTER — Encounter: Payer: Self-pay | Admitting: *Deleted

## 2016-01-12 ENCOUNTER — Telehealth: Payer: Self-pay | Admitting: Pharmacist

## 2016-01-12 NOTE — Telephone Encounter (Signed)
Patient states that cost of insulin was $141.00.  Will check with [facility].  Mostly likely due to the lost dosage of insulin- 1 box if over 1 month supply and patient is being charged multiple month copay or patient much meet a deductible at beginning of year and this is part of deductible.   Spoke with technician at [facility] and each monthly co pay for Nancee Liter is $47.  Because patient is not such a low dose - 1 box is at least 75 day supply.  Therefore insurance is charging 3 copays per box which equals $141.  Patient was given sample at office visit which will last her until next appointment 01/23/16.  Patient advised to continue with Basaglar she is currently taking 5 units once a day.  Will try to contact her insurance company to see about getting copay exception.  Contacted [facility] and they require a letter of medical necessity with request for copy review by sent to their exceptions team at fax [phone].  Will submit letter on behalf of patient.  Patient notified of above.

## 2016-01-14 ENCOUNTER — Emergency Department (HOSPITAL_COMMUNITY)
Admission: EM | Admit: 2016-01-14 | Discharge: 2016-01-15 | Disposition: A | Discharge status: Home / Self Care | Payer: Medicare Other | Attending: Emergency Medicine | Admitting: Emergency Medicine

## 2016-01-14 DIAGNOSIS — R011 Cardiac murmur, unspecified: Secondary | ICD-10-CM | POA: Diagnosis not present

## 2016-01-14 DIAGNOSIS — F419 Anxiety disorder, unspecified: Secondary | ICD-10-CM | POA: Insufficient documentation

## 2016-01-14 DIAGNOSIS — Z8669 Personal history of other diseases of the nervous system and sense organs: Secondary | ICD-10-CM | POA: Diagnosis not present

## 2016-01-14 DIAGNOSIS — Z8673 Personal history of transient ischemic attack (TIA), and cerebral infarction without residual deficits: Secondary | ICD-10-CM | POA: Diagnosis not present

## 2016-01-14 DIAGNOSIS — I10 Essential (primary) hypertension: Secondary | ICD-10-CM | POA: Diagnosis not present

## 2016-01-14 DIAGNOSIS — E119 Type 2 diabetes mellitus without complications: Secondary | ICD-10-CM | POA: Insufficient documentation

## 2016-01-14 DIAGNOSIS — Z794 Long term (current) use of insulin: Secondary | ICD-10-CM | POA: Insufficient documentation

## 2016-01-14 DIAGNOSIS — M1990 Unspecified osteoarthritis, unspecified site: Secondary | ICD-10-CM | POA: Insufficient documentation

## 2016-01-14 DIAGNOSIS — Z7982 Long term (current) use of aspirin: Secondary | ICD-10-CM | POA: Insufficient documentation

## 2016-01-14 DIAGNOSIS — Z853 Personal history of malignant neoplasm of breast: Secondary | ICD-10-CM | POA: Diagnosis not present

## 2016-01-14 DIAGNOSIS — R531 Weakness: Secondary | ICD-10-CM | POA: Diagnosis not present

## 2016-01-14 DIAGNOSIS — E559 Vitamin D deficiency, unspecified: Secondary | ICD-10-CM | POA: Insufficient documentation

## 2016-01-14 DIAGNOSIS — E785 Hyperlipidemia, unspecified: Secondary | ICD-10-CM | POA: Insufficient documentation

## 2016-01-14 DIAGNOSIS — K219 Gastro-esophageal reflux disease without esophagitis: Secondary | ICD-10-CM | POA: Insufficient documentation

## 2016-01-14 DIAGNOSIS — Z79899 Other long term (current) drug therapy: Secondary | ICD-10-CM | POA: Diagnosis not present

## 2016-01-14 DIAGNOSIS — H538 Other visual disturbances: Secondary | ICD-10-CM | POA: Diagnosis not present

## 2016-01-14 DIAGNOSIS — R410 Disorientation, unspecified: Secondary | ICD-10-CM | POA: Diagnosis not present

## 2016-01-14 NOTE — ED Notes (Signed)
Pt c/o having some blurry vision and states she feels weak and having numbness around her lips

## 2016-01-15 ENCOUNTER — Telehealth: Payer: Self-pay | Admitting: Family

## 2016-01-15 ENCOUNTER — Emergency Department (HOSPITAL_COMMUNITY): Payer: Medicare Other

## 2016-01-15 DIAGNOSIS — R531 Weakness: Secondary | ICD-10-CM | POA: Diagnosis not present

## 2016-01-15 DIAGNOSIS — R410 Disorientation, unspecified: Secondary | ICD-10-CM | POA: Diagnosis not present

## 2016-01-15 LAB — CBC WITH DIFFERENTIAL/PLATELET
Basophils Absolute: 0 10*3/uL (ref 0.0–0.1)
Basophils Relative: 0 %
EOS ABS: 0.1 10*3/uL (ref 0.0–0.7)
Eosinophils Relative: 1 %
HEMATOCRIT: 36.9 % (ref 36.0–46.0)
HEMOGLOBIN: 12.1 g/dL (ref 12.0–15.0)
LYMPHS ABS: 1.8 10*3/uL (ref 0.7–4.0)
Lymphocytes Relative: 37 %
MCH: 29.2 pg (ref 26.0–34.0)
MCHC: 32.8 g/dL (ref 30.0–36.0)
MCV: 89.1 fL (ref 78.0–100.0)
Monocytes Absolute: 0.3 10*3/uL (ref 0.1–1.0)
Monocytes Relative: 6 %
NEUTROS ABS: 2.8 10*3/uL (ref 1.7–7.7)
NEUTROS PCT: 56 %
Platelets: 326 10*3/uL (ref 150–400)
RBC: 4.14 MIL/uL (ref 3.87–5.11)
RDW: 12.3 % (ref 11.5–15.5)
WBC: 5 10*3/uL (ref 4.0–10.5)

## 2016-01-15 LAB — CBG MONITORING, ED
GLUCOSE-CAPILLARY: 144 mg/dL — AB (ref 65–99)
Glucose-Capillary: 168 mg/dL — ABNORMAL HIGH (ref 65–99)

## 2016-01-15 LAB — URINALYSIS, ROUTINE W REFLEX MICROSCOPIC
Bilirubin Urine: NEGATIVE
Glucose, UA: NEGATIVE mg/dL
Hgb urine dipstick: NEGATIVE
Ketones, ur: NEGATIVE mg/dL
Nitrite: NEGATIVE
PROTEIN: NEGATIVE mg/dL
Specific Gravity, Urine: <1.005 — ABNORMAL LOW (ref 1.005–1.030)
pH: 5 (ref 5.0–8.0)

## 2016-01-15 LAB — BASIC METABOLIC PANEL
Anion gap: 8 (ref 5–15)
BUN: 19 mg/dL (ref 6–20)
CHLORIDE: 104 mmol/L (ref 101–111)
CO2: 26 mmol/L (ref 22–32)
Calcium: 9.6 mg/dL (ref 8.9–10.3)
Creatinine, Ser: 0.96 mg/dL (ref 0.44–1.00)
GFR calc non Af Amer: >60 mL/min (ref >60)
Glucose, Bld: 185 mg/dL — ABNORMAL HIGH (ref 65–99)
POTASSIUM: 3.4 mmol/L — AB (ref 3.5–5.1)
SODIUM: 138 mmol/L (ref 135–145)

## 2016-01-15 LAB — URINE MICROSCOPIC-ADD ON

## 2016-01-15 MED ORDER — SODIUM CHLORIDE 0.9 % IV BOLUS (SEPSIS)
1000.0000 mL | Freq: Once | INTRAVENOUS | Status: AC
  Administered 2016-01-15: 1000 mL via INTRAVENOUS

## 2016-01-15 MED ORDER — TETRACAINE HCL 0.5 % OP SOLN
1.0000 [drp] | Freq: Once | OPHTHALMIC | Status: AC
  Administered 2016-01-15: 1 [drp] via OPHTHALMIC
  Filled 2016-01-15: qty 4

## 2016-01-15 NOTE — ED Notes (Signed)
Pt ambulated approximately 250 feet with steady and even gait. Pt did complain of some dizziness while ambulating.

## 2016-01-15 NOTE — Telephone Encounter (Signed)
Patient aware.

## 2016-01-15 NOTE — ED Notes (Signed)
Patient given yellow non skid socks and arm band. Assisted to restroom via wheelchair. Requires hands on assistance. Unsteady gait.

## 2016-01-15 NOTE — Discharge Instructions (Signed)
You were seen today for weakness and blurry vision. Sometimes when insulin is initiated, patients experience blurry vision because of fluid shifts in the body. Your workup is otherwise reassuring. There is no evidence of acute glaucoma. Follow-up with her primary physician later today for recheck.  Blurred Vision Having blurred vision means that you cannot see things clearly. Your vision may seem fuzzy or out of focus. Blurred vision is a very common symptom of an eye or vision problem. Blurred vision is often a gradual blur that occurs in one eye or both eyes. There are many causes of blurred vision, including cataracts, macular degeneration, and diabetic retinopathy. Blurred vision can be diagnosed based on your symptoms and a physical exam. Tell your health care provider about any other health problems you have, any recent eye injury, and any prior surgeries. You may need to see a health care provider who specializes in eye problems (ophthalmologist). Your treatment depends on what is causing your blurred vision.  HOME CARE INSTRUCTIONS  Tell your health care provider about any changes in your blurred vision.  Do not drive or operate heavy machinery if your vision is blurry.  Keep all follow-up visits as directed by your health care provider. This is important. SEEK MEDICAL CARE IF:  Your symptoms get worse.  You have new symptoms.  You have trouble seeing at night.  You have trouble seeing up close or far away.  You have trouble noticing the difference between colors. SEEK IMMEDIATE MEDICAL CARE IF:  You have severe eye pain.  You have a severe headache.  You have flashing lights in your field of vision.  You have a sudden change in vision.  You have a sudden loss of vision.  You have vision change after an injury.  You notice drainage coming from your eyes.  You notice a rash around your eyes.   This information is not intended to replace advice given to you by your  health care provider. Make sure you discuss any questions you have with your health care provider.   Document Released: 12/16/2003 Document Revised: 04/29/2015 Document Reviewed: 11/06/2014 Elsevier Interactive Patient Education [employer].

## 2016-01-15 NOTE — Telephone Encounter (Signed)
PT needs follow up appt if symptoms continue

## 2016-01-15 NOTE — ED Provider Notes (Signed)
CSN: [id number]     Arrival date & time 01/14/16  2234 History  By signing my name below, I, Arianna Nassar, attest that this documentation has been prepared under the direction and in the presence of Merryl Hacker, MD. Electronically Signed: Julien Nordmann, ED Scribe. 01/15/2016. 12:27 AM.    Chief Complaint  Patient presents with  . Weakness     The history is provided by the patient. No language interpreter was used.   HPI Comments: Renee [name] is a 65 y.o. female who has a hx of HTN, hyperlipidemia, CVA, DM, and breast cancer presents to the Emergency Department complaining of constant, gradual worsening weakness onset this morning with associated increased body aches and  blurry vision in both eyes. Pt was recently started on insulin last week and reports having increased blurry vision in both eyes without wearing her glasses. Pt wears glasses and states that her vision is blurry without wearing them but it is more increased than normal. She denies any eye pain or headache. She does have a history of glaucoma. States that she generally feels weak and feels like both my legs are going to "give out." Pt denies fever, cough, vomiting, diarrhea, congestion, and black spots/curtain over her vision.   Past Medical History  Diagnosis Date  . Hypertension   . GERD (gastroesophageal reflux disease)   . Vitamin D deficiency   . Hyperlipidemia   . Arthritis   . Heart murmur   . CVA (cerebral infarction) 2003, 2005  . Seizures ([place])   . DM (diabetes mellitus) ([place])   . Breast cancer [facility]) 2001    right mastectomy   Past Surgical History  Procedure Laterality Date  . Mastectomy  2001    right  . Abdominal hysterectomy  2000  . Ganglion cyst excision  2010    right hand   Family History  Problem Relation Age of Onset  . Colon cancer Neg Hx   . Stomach cancer Neg Hx    Social History  Substance Use Topics  . Smoking status: Never Smoker   . Smokeless tobacco: Never Used  .  Alcohol Use: No   OB History    No data available     Review of Systems  Constitutional: Negative for fever.  HENT: Negative for congestion.   Eyes: Positive for visual disturbance.  Respiratory: Negative for cough.   Gastrointestinal: Negative for vomiting and diarrhea.  Neurological: Positive for weakness.  All other systems reviewed and are negative.     Allergies  Metformin and related  Home Medications   Prior to Admission medications   Medication Sig Start Date End Date Taking? Authorizing Provider  aspirin 81 MG tablet Take 81 mg by mouth daily.    Historical Provider, MD  bisoprolol-hydrochlorothiazide [facility]) 2.5-6.25 MG per tablet Take 1 tablet by mouth daily. 06/26/15   Sharion Balloon, FNP  Blood Glucose Calibration (ONETOUCH VERIO) SOLN Use as needed to calibrate glucometer 01/05/16   Tammy Eckard, PHARMD  docusate sodium (COLACE) 100 MG capsule Take 100 mg by mouth daily.    Historical Provider, MD  glucose blood test strip Use to check blood glucose up to bid.  Dx:  Type 2 DM, uncontrolled, on insulin therapy  E.11.65 01/05/16   Tammy Eckard, PHARMD  Insulin Glargine (BASAGLAR KWIKPEN) 100 UNIT/ML Solostar Pen Inject 5 units once daily.  Increase every 3 days by 2 units until am blood glucose is less than 200.  Estimated daily amount = 20  units 01/05/16   Tammy Eckard, PHARMD  Insulin Pen Needle (BD PEN NEEDLE NANO U/F) 32G X 4 MM MISC Use to inject insulin with insulin pen once daily 01/05/16   Tammy Eckard, PHARMD  Multiple Vitamin (MULTIVITAMIN WITH MINERALS) TABS tablet Take 1 tablet by mouth daily.    Historical Provider, MD  omeprazole (PRILOSEC) 20 MG capsule Take 1 capsule (20 mg total) by mouth daily. 06/26/15   Sharion Balloon, FNP  ONETOUCH DELICA LANCETS [id number] MISC Use to check BG up to bid.  Dx: E11.65 type 2 DM with insulin therapy, uncontrolled 01/05/16   Tammy Eckard, PHARMD  simvastatin (ZOCOR) 40 MG tablet TAKE 1 TABLET (40 MG TOTAL) BY MOUTH AT BEDTIME. 12/26/15    Sharion Balloon, FNP  Vitamin D, Ergocalciferol, (DRISDOL) 50000 units CAPS capsule Take 1 capsule (50,000 Units total) by mouth every 7 (seven) days. Patient not taking: Reported on 01/05/2016 12/31/15   Sharion Balloon, FNP   Triage vitals: BP 153/66 mmHg  Pulse 72  Temp(Src) 99.1 F (37.3 C) (Oral)  Resp 16  Ht 5\' 6"  (1.676 m)  Wt 162 lb (73.483 kg)  BMI 26.16 kg/m2  SpO2 100% Physical Exam  Constitutional: She is oriented to person, place, and time. No distress.  HENT:  Head: Normocephalic and atraumatic.  Mouth/Throat: Oropharynx is clear and moist.  Eyes: Pupils are equal, round, and reactive to light.  Pupils 2 mm and reactive bilaterally, eye pressure 21 bilaterally  Neck: Neck supple.  Cardiovascular: Normal rate, regular rhythm and normal heart sounds.   No murmur heard. Pulmonary/Chest: Effort normal and breath sounds normal. No respiratory distress. She has no wheezes.  Abdominal: Soft. Bowel sounds are normal. There is no tenderness. There is no rebound.  Neurological: She is alert and oriented to person, place, and time.  Cranial nerves II through XII intact, no visual fields deficits with glasses on, 5 out of 5 strength bilateral upper extremities, 4+ out of 5 strength bilateral lower extremities, symmetric, question effort, normal reflexes, no dysmetria to finger-nose-finger  Skin: Skin is warm and dry.  Psychiatric:  Anxious appearing  Nursing note and vitals reviewed.   ED Course  Procedures  DIAGNOSTIC STUDIES: Oxygen Saturation is 100% on RA, normal by my interpretation.  COORDINATION OF CARE:  12:25 AM Discussed treatment plan with pt at bedside and pt agreed to plan.  Labs Review Labs Reviewed  BASIC METABOLIC PANEL - Abnormal; Notable for the following:    Potassium 3.4 (*)    Glucose, Bld 185 (*)    All other components within normal limits  URINALYSIS, ROUTINE W REFLEX MICROSCOPIC (NOT AT [facility]) - Abnormal; Notable for the following:    Color,  Urine STRAW (*)    Specific Gravity, Urine <1.005 (*)    Leukocytes, UA SMALL (*)    All other components within normal limits  URINE MICROSCOPIC-ADD ON - Abnormal; Notable for the following:    Squamous Epithelial / LPF 0-5 (*)    Bacteria, UA RARE (*)    All other components within normal limits  CBG MONITORING, ED - Abnormal; Notable for the following:    Glucose-Capillary 168 (*)    All other components within normal limits  CBC WITH DIFFERENTIAL/PLATELET  CBG MONITORING, ED    Imaging Review Ct Head Wo Contrast  01/15/2016  CLINICAL DATA:  65 year old female with weakness and confusion EXAM: CT HEAD WITHOUT CONTRAST TECHNIQUE: Contiguous axial images were obtained from the base of the skull through the  vertex without intravenous contrast. COMPARISON:  Head CT dated 12/31/2013 FINDINGS: The ventricles and sulci are appropriate in size for patient's age. Minimal periventricular and deep white matter hypodensities represent chronic microvascular ischemic changes. There is no intracranial hemorrhage. No mass effect or midline shift identified. The visualized paranasal sinuses and mastoid air cells are well aerated. The calvarium is intact. IMPRESSION: No acute intracranial pathology. Electronically Signed   By: Anner Crete M.D.   On: 01/15/2016 01:45   I have personally reviewed and evaluated these images and lab results as part of my medical decision-making.   EKG Interpretation   Date/Time:  Wednesday January 14 2016 22:54:22 EST Ventricular Rate:  69 PR Interval:  184 QRS Duration: 84 QT Interval:  426 QTC Calculation: 456 R Axis:   1 Text Interpretation:  Normal sinus rhythm Minimal voltage criteria for  LVH, may be normal variant Cannot rule out Anterior infarct , age  undetermined Abnormal ECG No prior for comparison Confirmed by HORTON  MD,  COURTNEY ([id number]) on 01/15/2016 1:04:46 AM      MDM   Final diagnoses:  Weakness  Blurry vision    Patient presents with  blurry vision and generalized weakness. Onset after starting insulin on Thursday. She has contacted her primary physician and has had several adjustments in her insulin. She reports persistent symptoms. She is nonfocal on exam. She does have a history of glaucoma but her eye pressures are normal. Her vision with her glasses on is reassuring.  Work up including basic labs, EKG, and head CT are all reassuring. Patient reports improvement after fluids. She is able to ambulate with a steady gait and without difficulty. Suspect patient's blurry vision is likely related to new shifts in blood sugar which is not uncommon with onset of insulin use. We'll have her follow up closely with her primary physician.  After history, exam, and medical workup I feel the patient has been appropriately medically screened and is safe for discharge home. Pertinent diagnoses were discussed with the patient. Patient was given return precautions.  I personally performed the services described in this documentation, which was scribed in my presence. The recorded information has been reviewed and is accurate.    Merryl Hacker, MD 01/15/16 [phone]

## 2016-01-20 ENCOUNTER — Telehealth: Payer: Self-pay | Admitting: *Deleted

## 2016-01-20 NOTE — Telephone Encounter (Signed)
Aware to follow orders given by provider.

## 2016-01-20 NOTE — Telephone Encounter (Signed)
Sister calls to say patient has blood sugar at 217.  Patient has blurred vision and feels weakness in her legs.  She has taken medications correctly today and ate her last meal around 5:30 pm.  She has been drinking fluids and urinating as normal. What should be done? Please call [phone].

## 2016-01-20 NOTE — Telephone Encounter (Signed)
PT can take 5 units of insulin and recheck BS. If weakness and blurry vision continues pt needs to go to ED

## 2016-01-23 ENCOUNTER — Ambulatory Visit (INDEPENDENT_AMBULATORY_CARE_PROVIDER_SITE_OTHER): Payer: Medicare Other | Admitting: Pharmacist

## 2016-01-23 ENCOUNTER — Encounter: Payer: Self-pay | Admitting: Pharmacist

## 2016-01-23 VITALS — BP 124/60 | HR 76 | Ht 66.0 in | Wt 163.0 lb

## 2016-01-23 DIAGNOSIS — E119 Type 2 diabetes mellitus without complications: Secondary | ICD-10-CM | POA: Diagnosis not present

## 2016-01-23 MED ORDER — SITAGLIPTIN PHOSPHATE 100 MG PO TABS: 100.0000 mg | ORAL_TABLET | Freq: Every day | ORAL | Status: DC

## 2016-01-23 NOTE — Progress Notes (Signed)
Subjective:    Renee [name] is a 65 y.o. female who presents reevaluation of Type 2 diabetes mellitus.  She was diagnosed with type 2 DM about 2 years ago.   Current symptoms/problems include hyperglycemia, nausea, polydipsia and visual disturbances and have been worsening.  Patients A1c on 12/26/2015 was greater than 14%.    She started Renee [name] /Lantus about 2 weeks ago and is taking 5 units once daly.  Her BG readings have improved but patient c/o feeling like her BG is low and having blurry vision. She went to ED 1 week ago because of these feelings.    She is afraid of all diabetes medications because of past reaction to metformin in 2014.  After 2 to 3 doses of metformin 1000mg  bid patient began to blackout and have seizures.  Metformin was subsequently discontinued and she has not a seizure since   Known diabetic complications: none Cardiovascular risk factors: advanced age (older than 75 for men, 37 for women), diabetes mellitus, dyslipidemia, hypertension and sedentary lifestyle  Eye exam current (within one year): yes Weight trend: stable Prior visit with CDE: yes  Current diet:  Eating fewer CHO since last vist Current exercise: none  Current monitoring regimen: home blood tests - one times daily  Home blood sugar records: only 2 readings - 175, 217, 160, 179, 194, 203, 239, 311, 371, 345  Any episodes of hypoglycemia? no  Is She on ACE inhibitor or angiotensin II receptor blocker?  No   The following portions of the patient's history were reviewed and updated as appropriate: allergies, current medications, past family history, past medical history, past surgical history and problem list.    Objective:    BP 124/60 mmHg  Pulse 76  Ht 5\' 6"  (1.676 m)  Wt 163 lb (73.936 kg)  BMI 26.32 kg/m2   A1c = greater than 14% (12/26/2015) RBG in office today was 181  Lab Review GLUCOSE (mg/dL)  Date Value  12/26/2015 372*  06/26/2015 111*  10/29/2014 113*   GLUCOSE,  BLD (mg/dL)  Date Value  01/15/2016 185*  12/31/2013 186*   CO2 (mmol/L)  Date Value  01/15/2016 26  12/26/2015 22  06/26/2015 22   BUN (mg/dL)  Date Value  01/15/2016 19  12/26/2015 13  06/26/2015 15  10/29/2014 16  12/31/2013 23   CREATININE, SER (mg/dL)  Date Value  01/15/2016 0.96  12/26/2015 0.96  06/26/2015 0.85    Assessment:    Diabetes Mellitus type II, under inadequate control. but much improved since last appt when insulin started. Even with such a low dose of insulin patient's BG has improved greatly    Plan:    1.  Rx changes: Discontinue insulin  Start Januvia 100mg  take 1 tablet daily.  Gave #35 samples.  2.  Reviewed CHO counting diet and serving size recommendations 3.   Education: Reviewed 'ABCs' of diabetes management (respective goals in parentheses):  A1C (<7), blood pressure (<130/80), and cholesterol (LDL <100). 4.   Follow up: 3 weeks    Cherre Robins, PharmD, CPP, CDE

## 2016-01-23 NOTE — Patient Instructions (Signed)

## 2016-02-10 ENCOUNTER — Other Ambulatory Visit: Payer: Self-pay | Admitting: Family

## 2016-02-10 DIAGNOSIS — Z853 Personal history of malignant neoplasm of breast: Secondary | ICD-10-CM

## 2016-02-10 NOTE — Progress Notes (Signed)
Pt states she was in her fifties when she had breast cancer in the right breast and went through chemo. She would like genetic testing.

## 2016-02-10 NOTE — Progress Notes (Signed)
Does patient have a personal history of breast cancer before age 65 years old? PT reported this to [facility] while getting mammogram. If pt does have history she qualifies for genetic testing. Is this something patient would like? If so I will order. Thanks!   Referral orderd

## 2016-02-27 ENCOUNTER — Ambulatory Visit (INDEPENDENT_AMBULATORY_CARE_PROVIDER_SITE_OTHER): Payer: Medicare Other | Admitting: Pharmacist

## 2016-02-27 ENCOUNTER — Encounter: Payer: Self-pay | Admitting: Pharmacist

## 2016-02-27 VITALS — BP 122/80 | HR 74 | Ht 66.0 in | Wt 168.0 lb

## 2016-02-27 DIAGNOSIS — E119 Type 2 diabetes mellitus without complications: Secondary | ICD-10-CM

## 2016-02-27 NOTE — Patient Instructions (Signed)
Continue Januvia 100mg  take 1 tablet daily - great job!

## 2016-02-27 NOTE — Progress Notes (Signed)
Subjective:    Renee [name] is a 65 y.o. female who presents reevaluation of Type 2 diabetes mellitus.  She was diagnosed with type 2 DM about 2 years ago.   Current symptoms/problems include hyperglycemia and visual disturbances and have been improving over the last month.    Patients A1c on 12/26/2015 was greater than 14%.    She started Basaglar /Lantus in January at a very low dose.  Her BG readings  improved but patient c/o feeling like her BG is low and having blurry vision. She went to ED because of these feelings.  At our last appt stopped insulin and changed to Januvia 100mg  1 tablet daily.  She is tolerating Januvia well.  She is cautious of all diabetes medications because of past reaction to metformin in 2014.  After 2 to 3 doses of metformin 1000mg  bid patient began to blackout and have seizures.  Metformin was subsequently discontinued and she has not a seizure since   Known diabetic complications: none Cardiovascular risk factors: advanced age (older than 91 for men, 55 for women), diabetes mellitus, dyslipidemia, hypertension and sedentary lifestyle  Eye exam current (within one year): yes - Dr Marin Comment  Weight trend: increased 5# Prior visit with CDE: yes  Current diet:  Patient has added more whole grains and if limiting serving sizes of high CHO containing foods.  She has made great improvements in diet.  Breakfast - no sugar added shredded wheat, 2 eggs + 1 slices of whole grain toast or 1 pkg of oatmeal Lunch - lean, low sodium ham sandwich + salad + water Supper - salad + meat + water Snack - small bowl of low sugar ice cream  Current exercise: a little walking - once a week  Current monitoring regimen: home blood tests - one times daily  Home blood sugar records: 160, 177, 180, 172, 203, 169, 165, 171, 181, 160, 165, 162, 140  Any episodes of hypoglycemia? no  Is She on ACE inhibitor or angiotensin II receptor blocker?  No   The following portions of the patient's  history were reviewed and updated as appropriate: allergies, current medications, past family history, past medical history, past surgical history and problem list.    Objective:    BP 122/80 mmHg  Pulse 74  Ht 5\' 6"  (1.676 m)  Wt 168 lb (76.204 kg)  BMI 27.13 kg/m2   A1c = greater than 14% (12/26/2015) RBG in office today was 187  Lab Review GLUCOSE (mg/dL)  Date Value  12/26/2015 372*  06/26/2015 111*  10/29/2014 113*   GLUCOSE, BLD (mg/dL)  Date Value  01/15/2016 185*  12/31/2013 186*   CO2 (mmol/L)  Date Value  01/15/2016 26  12/26/2015 22  06/26/2015 22   BUN (mg/dL)  Date Value  01/15/2016 19  12/26/2015 13  06/26/2015 15  10/29/2014 16  12/31/2013 23   CREATININE, SER (mg/dL)  Date Value  01/15/2016 0.96  12/26/2015 0.96  06/26/2015 0.85    Assessment:    Diabetes Mellitus type II, under improving control.    Plan:    1.  Rx changes:  None, continue Januvia 100mg  take 1 tablet daily and all other current medications. 2.  Nutrition:  Continue with current CHO counting diet.   3.   Education: Reviewed 'ABCs' of diabetes management (respective goals in parentheses):  A1C (<7), blood pressure (<130/80), and cholesterol (LDL <100). 4.  Increase exercise to 4 to 5 days per week 5.   Follow up: 1  month with PCP; CDE as needed  Cherre Robins, PharmD, CPP, CDE

## 2016-03-03 ENCOUNTER — Encounter: Payer: Self-pay | Admitting: Genetic Counselor

## 2016-03-03 ENCOUNTER — Telehealth: Payer: Self-pay | Admitting: Genetic Counselor

## 2016-03-03 NOTE — Telephone Encounter (Signed)
Verified address and insurance, faxed referring provider letter (Dr. Lenna Gilford); mailed new pt packet, scheduled intake.

## 2016-03-18 ENCOUNTER — Other Ambulatory Visit: Payer: Self-pay | Admitting: Pharmacist

## 2016-03-30 ENCOUNTER — Encounter: Payer: Self-pay | Admitting: Family

## 2016-03-30 ENCOUNTER — Ambulatory Visit (INDEPENDENT_AMBULATORY_CARE_PROVIDER_SITE_OTHER): Payer: Medicare Other | Admitting: Family

## 2016-03-30 VITALS — BP 125/79 | HR 71 | Temp 97.6°F | Ht 66.0 in | Wt 170.0 lb

## 2016-03-30 DIAGNOSIS — K2100 Gastro-esophageal reflux disease with esophagitis, without bleeding: Secondary | ICD-10-CM

## 2016-03-30 DIAGNOSIS — E785 Hyperlipidemia, unspecified: Secondary | ICD-10-CM | POA: Diagnosis not present

## 2016-03-30 DIAGNOSIS — Z794 Long term (current) use of insulin: Secondary | ICD-10-CM | POA: Diagnosis not present

## 2016-03-30 DIAGNOSIS — I10 Essential (primary) hypertension: Secondary | ICD-10-CM | POA: Diagnosis not present

## 2016-03-30 DIAGNOSIS — E1142 Type 2 diabetes mellitus with diabetic polyneuropathy: Secondary | ICD-10-CM

## 2016-03-30 DIAGNOSIS — Z8673 Personal history of transient ischemic attack (TIA), and cerebral infarction without residual deficits: Secondary | ICD-10-CM

## 2016-03-30 DIAGNOSIS — E1165 Type 2 diabetes mellitus with hyperglycemia: Secondary | ICD-10-CM

## 2016-03-30 DIAGNOSIS — IMO0002 Reserved for concepts with insufficient information to code with codable children: Secondary | ICD-10-CM

## 2016-03-30 LAB — BAYER DCA HB A1C WAIVED: HB A1C: 7.9 % — AB (ref <7.0)

## 2016-03-30 MED ORDER — GABAPENTIN 300 MG PO CAPS: 300.0000 mg | ORAL_CAPSULE | Freq: Three times a day (TID) | ORAL | Status: DC

## 2016-03-30 NOTE — Patient Instructions (Signed)
Health Maintenance, Female Adopting a healthy lifestyle and getting preventive care can go a long way to promote health and wellness. Talk with your health care provider about what schedule of regular examinations is right for you. This is a good chance for you to check in with your provider about disease prevention and staying healthy. In between checkups, there are plenty of things you can do on your own. Experts have done a lot of research about which lifestyle changes and preventive measures are most likely to keep you healthy. Ask your health care provider for more information. WEIGHT AND DIET  Eat a healthy diet  Be sure to include plenty of vegetables, fruits, low-fat dairy products, and lean protein.  Do not eat a lot of foods high in solid fats, added sugars, or salt.  Get regular exercise. This is one of the most important things you can do for your health.  Most adults should exercise for at least 150 minutes each week. The exercise should increase your heart rate and make you sweat (moderate-intensity exercise).  Most adults should also do strengthening exercises at least twice a week. This is in addition to the moderate-intensity exercise.  Maintain a healthy weight  Body mass index (BMI) is a measurement that can be used to identify possible weight problems. It estimates body fat based on height and weight. Your health care provider can help determine your BMI and help you achieve or maintain a healthy weight.  For females 20 years of age and older:   A BMI below 18.5 is considered underweight.  A BMI of 18.5 to 24.9 is normal.  A BMI of 25 to 29.9 is considered overweight.  A BMI of 30 and above is considered obese.  Watch levels of cholesterol and blood lipids  You should start having your blood tested for lipids and cholesterol at 66 years of age, then have this test every 5 years.  You may need to have your cholesterol levels checked more often if:  Your lipid  or cholesterol levels are high.  You are older than 66 years of age.  You are at high risk for heart disease.  CANCER SCREENING   Lung Cancer  Lung cancer screening is recommended for adults 55-80 years old who are at high risk for lung cancer because of a history of smoking.  A yearly low-dose CT scan of the lungs is recommended for people who:  Currently smoke.  Have quit within the past 15 years.  Have at least a 30-pack-year history of smoking. A pack year is smoking an average of one pack of cigarettes a day for 1 year.  Yearly screening should continue until it has been 15 years since you quit.  Yearly screening should stop if you develop a health problem that would prevent you from having lung cancer treatment.  Breast Cancer  Practice breast self-awareness. This means understanding how your breasts normally appear and feel.  It also means doing regular breast self-exams. Let your health care provider know about any changes, no matter how small.  If you are in your 20s or 30s, you should have a clinical breast exam (CBE) by a health care provider every 1-3 years as part of a regular health exam.  If you are 40 or older, have a CBE every year. Also consider having a breast X-ray (mammogram) every year.  If you have a family history of breast cancer, talk to your health care provider about genetic screening.  If you   are at high risk for breast cancer, talk to your health care provider about having an MRI and a mammogram every year.  Breast cancer gene (BRCA) assessment is recommended for women who have family members with BRCA-related cancers. BRCA-related cancers include:  Breast.  Ovarian.  Tubal.  Peritoneal cancers.  Results of the assessment will determine the need for genetic counseling and BRCA1 and BRCA2 testing. Cervical Cancer Your health care provider may recommend that you be screened regularly for cancer of the pelvic organs (ovaries, uterus, and  vagina). This screening involves a pelvic examination, including checking for microscopic changes to the surface of your cervix (Pap test). You may be encouraged to have this screening done every 3 years, beginning at age 21.  For women ages 30-65, health care providers may recommend pelvic exams and Pap testing every 3 years, or they may recommend the Pap and pelvic exam, combined with testing for human papilloma virus (HPV), every 5 years. Some types of HPV increase your risk of cervical cancer. Testing for HPV may also be done on women of any age with unclear Pap test results.  Other health care providers may not recommend any screening for nonpregnant women who are considered low risk for pelvic cancer and who do not have symptoms. Ask your health care provider if a screening pelvic exam is right for you.  If you have had past treatment for cervical cancer or a condition that could lead to cancer, you need Pap tests and screening for cancer for at least 20 years after your treatment. If Pap tests have been discontinued, your risk factors (such as having a new sexual partner) need to be reassessed to determine if screening should resume. Some women have medical problems that increase the chance of getting cervical cancer. In these cases, your health care provider may recommend more frequent screening and Pap tests. Colorectal Cancer  This type of cancer can be detected and often prevented.  Routine colorectal cancer screening usually begins at 66 years of age and continues through 66 years of age.  Your health care provider may recommend screening at an earlier age if you have risk factors for colon cancer.  Your health care provider may also recommend using home test kits to check for hidden blood in the stool.  A small camera at the end of a tube can be used to examine your colon directly (sigmoidoscopy or colonoscopy). This is done to check for the earliest forms of colorectal  cancer.  Routine screening usually begins at age 50.  Direct examination of the colon should be repeated every 5-10 years through 66 years of age. However, you may need to be screened more often if early forms of precancerous polyps or small growths are found. Skin Cancer  Check your skin from head to toe regularly.  Tell your health care provider about any new moles or changes in moles, especially if there is a change in a mole's shape or color.  Also tell your health care provider if you have a mole that is larger than the size of a pencil eraser.  Always use sunscreen. Apply sunscreen liberally and repeatedly throughout the day.  Protect yourself by wearing long sleeves, pants, a wide-brimmed hat, and sunglasses whenever you are outside. HEART DISEASE, DIABETES, AND HIGH BLOOD PRESSURE   High blood pressure causes heart disease and increases the risk of stroke. High blood pressure is more likely to develop in:  People who have blood pressure in the high end   of the normal range (130-139/85-89 mm Hg).  People who are overweight or obese.  People who are African American.  If you are 38-23 years of age, have your blood pressure checked every 3-5 years. If you are 61 years of age or older, have your blood pressure checked every year. You should have your blood pressure measured twice--once when you are at a hospital or clinic, and once when you are not at a hospital or clinic. Record the average of the two measurements. To check your blood pressure when you are not at a hospital or clinic, you can use:  An automated blood pressure machine at a pharmacy.  A home blood pressure monitor.  If you are between 45 years and 39 years old, ask your health care provider if you should take aspirin to prevent strokes.  Have regular diabetes screenings. This involves taking a blood sample to check your fasting blood sugar level.  If you are at a normal weight and have a low risk for diabetes,  have this test once every three years after 66 years of age.  If you are overweight and have a high risk for diabetes, consider being tested at a younger age or more often. PREVENTING INFECTION  Hepatitis B  If you have a higher risk for hepatitis B, you should be screened for this virus. You are considered at high risk for hepatitis B if:  You were born in a country where hepatitis B is common. Ask your health care provider which countries are considered high risk.  Your parents were born in a high-risk country, and you have not been immunized against hepatitis B (hepatitis B vaccine).  You have HIV or AIDS.  You use needles to inject street drugs.  You live with someone who has hepatitis B.  You have had sex with someone who has hepatitis B.  You get hemodialysis treatment.  You take certain medicines for conditions, including cancer, organ transplantation, and autoimmune conditions. Hepatitis C  Blood testing is recommended for:  Everyone born from [birth year] through 1966.  Anyone with known risk factors for hepatitis C. Sexually transmitted infections (STIs)  You should be screened for sexually transmitted infections (STIs) including gonorrhea and chlamydia if:  You are sexually active and are younger than 66 years of age.  You are older than 66 years of age and your health care provider tells you that you are at risk for this type of infection.  Your sexual activity has changed since you were last screened and you are at an increased risk for chlamydia or gonorrhea. Ask your health care provider if you are at risk.  If you do not have HIV, but are at risk, it may be recommended that you take a prescription medicine daily to prevent HIV infection. This is called pre-exposure prophylaxis (PrEP). You are considered at risk if:  You are sexually active and do not regularly use condoms or know the HIV status of your partner(s).  You take drugs by injection.  You are sexually  active with a partner who has HIV. Talk with your health care provider about whether you are at high risk of being infected with HIV. If you choose to begin PrEP, you should first be tested for HIV. You should then be tested every 3 months for as long as you are taking PrEP.  PREGNANCY   If you are premenopausal and you may become pregnant, ask your health care provider about preconception counseling.  If you may  become pregnant, take 400 to 800 micrograms (mcg) of folic acid every day.  If you want to prevent pregnancy, talk to your health care provider about birth control (contraception). OSTEOPOROSIS AND MENOPAUSE   Osteoporosis is a disease in which the bones lose minerals and strength with aging. This can result in serious bone fractures. Your risk for osteoporosis can be identified using a bone density scan.  If you are 61 years of age or older, or if you are at risk for osteoporosis and fractures, ask your health care provider if you should be screened.  Ask your health care provider whether you should take a calcium or vitamin D supplement to lower your risk for osteoporosis.  Menopause may have certain physical symptoms and risks.  Hormone replacement therapy may reduce some of these symptoms and risks. Talk to your health care provider about whether hormone replacement therapy is right for you.  HOME CARE INSTRUCTIONS   Schedule regular health, dental, and eye exams.  Stay current with your immunizations.   Do not use any tobacco products including cigarettes, chewing tobacco, or electronic cigarettes.  If you are pregnant, do not drink alcohol.  If you are breastfeeding, limit how much and how often you drink alcohol.  Limit alcohol intake to no more than 1 drink per day for nonpregnant women. One drink equals 12 ounces of beer, 5 ounces of wine, or 1 ounces of hard liquor.  Do not use street drugs.  Do not share needles.  Ask your health care provider for help if  you need support or information about quitting drugs.  Tell your health care provider if you often feel depressed.  Tell your health care provider if you have ever been abused or do not feel safe at home.   This information is not intended to replace advice given to you by your health care provider. Make sure you discuss any questions you have with your health care provider.   Document Released: 06/28/2011 Document Revised: 01/03/2015 Document Reviewed: 11/14/2013 Elsevier Interactive Patient Education [employer].

## 2016-03-30 NOTE — Progress Notes (Signed)
Subjective:    Patient ID: Renee [name], female    DOB: 1951-11-30, 65 y.o.   MRN: [id number]   Pt presents to the office today for chronic follow up.  Diabetes She presents for her follow-up diabetic visit. She has type 2 diabetes mellitus. Her disease course has been stable. There are no hypoglycemic associated symptoms. Pertinent negatives for hypoglycemia include no headaches. Associated symptoms include foot paresthesias. Pertinent negatives for diabetes include no blurred vision and no visual change. There are no hypoglycemic complications. Symptoms are stable. Diabetic complications include a CVA and peripheral neuropathy. Pertinent negatives for diabetic complications include no heart disease or nephropathy. Risk factors for coronary artery disease include family history, dyslipidemia, diabetes mellitus, hypertension, obesity, sedentary lifestyle and post-menopausal. Current diabetic treatment includes diet. She is following a generally healthy diet. Her breakfast blood glucose range is generally 140-180 mg/dl. An ACE inhibitor/angiotensin II receptor blocker is being taken. Eye exam is current.  Hypertension This is a chronic problem. The current episode started more than 1 year ago. The problem has been resolved since onset. The problem is controlled. Pertinent negatives include no blurred vision, headaches, palpitations, peripheral edema or shortness of breath. Risk factors for coronary artery disease include dyslipidemia, family history, diabetes mellitus, obesity, post-menopausal state and sedentary lifestyle. Past treatments include beta blockers and diuretics. The current treatment provides significant improvement. Hypertensive end-organ damage includes CVA. There is no history of kidney disease, CAD/MI, heart failure or a thyroid problem. There is no history of sleep apnea.  Hyperlipidemia This is a chronic problem. The current episode started more than 1 year ago. The problem is  controlled. Recent lipid tests were reviewed and are normal. Exacerbating diseases include diabetes. She has no history of hypothyroidism. Pertinent negatives include no shortness of breath. Current antihyperlipidemic treatment includes statins. The current treatment provides moderate improvement of lipids. Risk factors for coronary artery disease include diabetes mellitus, dyslipidemia, family history, hypertension, obesity and post-menopausal.  Gastroesophageal Reflux She reports no belching, no coughing or no heartburn. This is a chronic problem. The current episode started more than 1 year ago. The problem occurs rarely. The problem has been resolved. The symptoms are aggravated by certain foods. She has tried a PPI for the symptoms. The treatment provided significant relief.  Peripheral Neuropathy Pt states she is having burning and pain in her left food. PT states she is having intermittent burning pain of 7 out 10.     Review of Systems  Constitutional: Negative.   HENT: Negative.   Eyes: Negative.  Negative for blurred vision.  Respiratory: Negative.  Negative for cough and shortness of breath.   Cardiovascular: Negative.  Negative for palpitations.  Gastrointestinal: Negative.  Negative for heartburn.  Endocrine: Negative.   Genitourinary: Negative.   Musculoskeletal: Negative.   Neurological: Negative.  Negative for headaches.  Hematological: Negative.   Psychiatric/Behavioral: Negative.   All other systems reviewed and are negative.      Objective:   Physical Exam  Constitutional: She is oriented to person, place, and time. She appears well-developed and well-nourished. No distress.  HENT:  Head: Normocephalic and atraumatic.  Right Ear: External ear normal.  Left Ear: External ear normal.  Nose: Nose normal.  Mouth/Throat: Oropharynx is clear and moist.  Eyes: Pupils are equal, round, and reactive to light.  Neck: Normal range of motion. Neck supple. No thyromegaly  present.  Cardiovascular: Normal rate, regular rhythm, normal heart sounds and intact distal pulses.   No murmur heard.  Pulmonary/Chest: Effort normal and breath sounds normal. No respiratory distress. She has no wheezes.  Abdominal: Soft. Bowel sounds are normal. She exhibits no distension. There is no tenderness.  Musculoskeletal: Normal range of motion. She exhibits no edema or tenderness.  Neurological: She is alert and oriented to person, place, and time. She has normal reflexes. No cranial nerve deficit.  Skin: Skin is warm and dry.  Psychiatric: She has a normal mood and affect. Her behavior is normal. Judgment and thought content normal.  Vitals reviewed.     BP 125/79 mmHg  Pulse 71  Temp(Src) 97.6 F (36.4 C) (Oral)  Ht 5' 6" (1.676 m)  Wt 170 lb (77.111 kg)  BMI 27.45 kg/m2     Assessment & Plan:  1. Essential hypertension - CMP14+EGFR  2. History of CVA (cerebrovascular accident) - CMP14+EGFR  3. HLD (hyperlipidemia) - CMP14+EGFR - Lipid panel  4. Gastroesophageal reflux disease with esophagitis - CMP14+EGFR  5. Uncontrolled type 2 diabetes mellitus with insulin therapy ([place]) - CMP14+EGFR - Bayer DCA Hb A1c Waived  6. Diabetic peripheral neuropathy (HCC) -Pt started on Gabapentin today PT told to take 300 mg once, then day 2 take BID, then day 3 take TID - CMP14+EGFR - gabapentin (NEURONTIN) 300 MG capsule; Take 1 capsule (300 mg total) by mouth 3 (three) times daily.  Dispense: 90 capsule; Refill: 3   Continue all meds Labs pending Health Maintenance reviewed Diet and exercise encouraged RTO 6 months  Evelina Dun, FNP

## 2016-03-31 LAB — LIPID PANEL
CHOL/HDL RATIO: 3.2 ratio (ref 0.0–4.4)
Cholesterol, Total: 147 mg/dL (ref 100–199)
HDL: 46 mg/dL (ref >39)
LDL CALC: 80 mg/dL (ref 0–99)
Triglycerides: 107 mg/dL (ref 0–149)
VLDL Cholesterol Cal: 21 mg/dL (ref 5–40)

## 2016-03-31 LAB — CMP14+EGFR
A/G RATIO: 1.4 (ref 1.2–2.2)
ALT: 18 IU/L (ref 0–32)
AST: 16 IU/L (ref 0–40)
Albumin: 4.1 g/dL (ref 3.6–4.8)
Alkaline Phosphatase: 76 IU/L (ref 39–117)
BUN/Creatinine Ratio: 20 (ref 12–28)
BUN: 17 mg/dL (ref 8–27)
Bilirubin Total: 0.3 mg/dL (ref 0.0–1.2)
CALCIUM: 9.8 mg/dL (ref 8.7–10.3)
CO2: 24 mmol/L (ref 18–29)
Chloride: 98 mmol/L (ref 96–106)
Creatinine, Ser: 0.87 mg/dL (ref 0.57–1.00)
GFR, EST AFRICAN AMERICAN: 80 mL/min/{1.73_m2} (ref >59)
GFR, EST NON AFRICAN AMERICAN: 70 mL/min/{1.73_m2} (ref >59)
GLOBULIN, TOTAL: 2.9 g/dL (ref 1.5–4.5)
Glucose: 180 mg/dL — ABNORMAL HIGH (ref 65–99)
POTASSIUM: 4.4 mmol/L (ref 3.5–5.2)
SODIUM: 138 mmol/L (ref 134–144)
TOTAL PROTEIN: 7 g/dL (ref 6.0–8.5)

## 2016-04-01 ENCOUNTER — Ambulatory Visit (HOSPITAL_BASED_OUTPATIENT_CLINIC_OR_DEPARTMENT_OTHER): Payer: Medicare Other | Admitting: Genetic Counselor

## 2016-04-01 ENCOUNTER — Other Ambulatory Visit: Payer: Self-pay | Admitting: Family

## 2016-04-01 ENCOUNTER — Other Ambulatory Visit: Payer: Medicare Other

## 2016-04-01 DIAGNOSIS — Z853 Personal history of malignant neoplasm of breast: Secondary | ICD-10-CM | POA: Diagnosis not present

## 2016-04-01 DIAGNOSIS — Z809 Family history of malignant neoplasm, unspecified: Secondary | ICD-10-CM | POA: Diagnosis not present

## 2016-04-01 DIAGNOSIS — Z803 Family history of malignant neoplasm of breast: Secondary | ICD-10-CM | POA: Diagnosis not present

## 2016-04-01 DIAGNOSIS — Z8051 Family history of malignant neoplasm of kidney: Secondary | ICD-10-CM

## 2016-04-01 DIAGNOSIS — Z315 Encounter for genetic counseling: Secondary | ICD-10-CM | POA: Diagnosis not present

## 2016-04-01 MED ORDER — DULAGLUTIDE 0.75 MG/0.5ML ~~LOC~~ SOAJ: 0.7500 mg | SUBCUTANEOUS | Status: DC

## 2016-04-02 ENCOUNTER — Encounter: Payer: Self-pay | Admitting: Genetic Counselor

## 2016-04-02 ENCOUNTER — Telehealth: Payer: Self-pay | Admitting: Family

## 2016-04-02 DIAGNOSIS — Z803 Family history of malignant neoplasm of breast: Secondary | ICD-10-CM | POA: Insufficient documentation

## 2016-04-02 DIAGNOSIS — Z853 Personal history of malignant neoplasm of breast: Secondary | ICD-10-CM | POA: Insufficient documentation

## 2016-04-02 NOTE — Progress Notes (Signed)
REFERRING PROVIDER: Sharion Balloon, [address] [place], [place] [address]  PRIMARY PROVIDER:  Evelina Dun, FNP  PRIMARY REASON FOR VISIT:  1. History of breast cancer in female   2. Family history of breast cancer in sister   21. Family history of renal cancer   4. Family history of cancer      HISTORY OF PRESENT ILLNESS:   Renee [name], a 65 y.o. female, was seen for a [place] cancer genetics consultation at the request of [place] due to a personal history of breast cancer at age 49-50 and family history of breast and other cancers.  Renee [name] presents to clinic today with her sister, Renee [name], to discuss the possibility of a hereditary predisposition to cancer, genetic testing, and to further clarify her future cancer risks, as well as potential cancer risks for family members.   In approximately 2001, at the age of 63-50, Renee [name] was diagnosed with cancer of the right breast. This was treated with right mastectomy.   CANCER HISTORY:   No history exists.     HORMONAL RISK FACTORS:  Menarche was at age 82.  First live birth at age - no children.  OCP use for approximately 0 years.  Ovaries intact: believes that her ovaries and fallopian tubes were removed at the time of hysterectomy.  Hysterectomy: yes, in 2000 due to heavy bleeding. Menopausal status: postmenopausal.  HRT use: approx 6 months. Colonoscopy: yes; in 07/2012 - one rectosigmoid hyperplastic polyp found. Mammogram within the last year: yes. Number of breast biopsies: 1. Up to date with pelvic exams:  no. Any excessive radiation exposure in the past:  no, but does report some history of secondhand smoke, though not a lot  Past Medical History  Diagnosis Date  . Hypertension   . GERD (gastroesophageal reflux disease)   . Vitamin D deficiency   . Hyperlipidemia   . Arthritis   . Heart murmur   . CVA (cerebral infarction) 2003, 2005  . Seizures ([place])   . DM (diabetes mellitus) ([place])    . Breast cancer [facility]) 2001    right mastectomy    Past Surgical History  Procedure Laterality Date  . Mastectomy  2001    right  . Abdominal hysterectomy  2000  . Ganglion cyst excision  2010    right hand    Social History   Social History  . Marital Status: Single    Spouse Name: N/A  . Number of Children: N/A  . Years of Education: N/A   Social History Main Topics  . Smoking status: Never Smoker   . Smokeless tobacco: Never Used  . Alcohol Use: No  . Drug Use: No  . Sexual Activity: Not Currently    Birth Control/ Protection: Post-menopausal   Other Topics Concern  . None   Social History Narrative     FAMILY HISTORY:  We obtained a detailed, 4-generation family history.  Significant diagnoses are listed below: Family History  Problem Relation Age of Onset  . Colon cancer Neg Hx   . Stomach cancer Neg Hx   . Alzheimer's disease Mother   . Heart Problems Father   . Kidney disease Father     +dialysis  . Other Sister     +TAH for fibroids  . Cancer Paternal Aunt     NOS cancer  . Breast cancer Sister     dx. 4-56  . Renal cancer Sister     dx. 35-63;  not a smoker    Renee [name] has three full brothers and six full sisters.  One brother died in childbirth.  Two full brothers are still living at ages 62 and 35 and have never had cancer.  One sister was murdered at the age of 64.  The other sisters are still living and are between the ages of 46-81.  One sister was diagnosed with breast cancer at age 28-56.  Another sister, a non-smoker, was diagnosed with renal cancer at age 20-63.  Renee [name] has a history of a hysterectomy due to fibroids.  There is no cancer in any of Renee [name]'s nieces or nephews.  Renee [name] mother died of alzheimer's at the age of 57.  Her father died of kidney disease and heart problems at the age of 41.  Neither of her parents were ever diagnosed with cancer.  Renee [name] mother had one full brother who died in childbirth.  She also  had one paternal half-brother who has passed away, but for whom Renee [name] has no further information.  Her maternal grandmother died of the flu at the age of 33.  Her grandfather died in his 36s, but never had cancer.  She had no further information for any maternal great aunts/uncles or great grandparents.  Renee [name] father had five full brothers, two full sisters, and eight paternal half-sisters.  All of the full siblings passed away at later ages.  Only one full sibling, a sister, died of unspecified type of cancer in her 1s. She had no children of her own.  Renee [name] reports no known cancers in any of her father's paternal half-sisters or in any of her paternal first cousins.  Renee [name] paternal grandmother died at the age of 61.  Her grandfather died of the flu in his late 65s.  She had no further information for any paternal great aunts/uncles or great grandparents.  Renee [name] and her sister are unaware of any family history of genetic testing for hereditary cancer.  Patient's maternal  and paternal ancestors are of African American descent. There is no reported Ashkenazi Jewish ancestry. There is no known consanguinity.  GENETIC COUNSELING ASSESSMENT: Renee [name] is a 65 y.o. female with a personal and family history of breast cancer which is somewhat suggestive of a hereditary breast cancer sydnrome and predisposition to cancer. We, therefore, discussed and recommended the following at today's visit.   DISCUSSION: We reviewed the characteristics, features and inheritance patterns of hereditary cancer syndromes, particularly those caused by mutations in the BRCA1/2 genes. We also discussed genetic testing, including the appropriate family members to test, the process of testing, insurance coverage and turn-around-time for results. We discussed the implications of a negative, positive and/or variant of uncertain significant result. We recommended Renee [name] pursue genetic testing for the  20-gene Breast/Ovarian Cancer Panel through [facility].  The Breast/Ovarian Cancer Panel offered by [facility] Hope Pigeon, MD) includes sequencing and deletion/duplication analysis for the following 19 genes:  ATM, BARD1, BRCA1, BRCA2, BRIP1, CDH1, CHEK2, FANCC, MLH1, MSH2, MSH6, NBN, PALB2, PMS2, PTEN, RAD51C, RAD51D, TP53, and XRCC2.  This panel also includes deletion/duplication analysis (without sequencing) for one gene, EPCAM.  Based on Renee [name]'s personal and family history of cancer, she meets medical criteria for genetic testing. Despite that she meets criteria, she may still have an out of pocket cost. We discussed that if her out of pocket cost for testing is over $100, the laboratory will call and confirm whether she wants  to proceed with testing.  If the out of pocket cost of testing is less than $100 she will be billed by the genetic testing laboratory.   PLAN: After considering the risks, benefits, and limitations, Renee [name]  provided informed consent to pursue genetic testing and the blood sample was sent to [facility] for analysis of the 20-gene Breast/Ovarian Cancer Panel. Results should be available within approximately 2-3 weeks' time, at which point they will be disclosed by telephone to Renee [name], as will any additional recommendations warranted by these results. Renee [name] will receive a summary of her genetic counseling visit and a copy of her results once available. This information will also be available in Epic. We encouraged Ms. Preece to remain in contact with cancer genetics annually so that we can continuously update the family history and inform her of any changes in cancer genetics and testing that may be of benefit for her family. Ms. Fishburn questions were answered to her satisfaction today. Our contact information was provided should additional questions or concerns arise.  Thank you for the referral and allowing [place] to share in the care of  your patient.   Jeanine Luz, MS, [facility] Certified Genetic Counselor [email or web address] Phone: [phone]  The patient was seen for a total of 60 minutes in face-to-face genetic counseling.  This patient was discussed with Drs. Magrinat, Lindi Adie and/or Burr Medico who agrees with the above.    _______________________________________________________________________ For Office Staff:  Number of people involved in session: 2 Was an Intern/ student involved with case: no

## 2016-04-05 NOTE — Telephone Encounter (Signed)
Reviewed lab results with patient today.

## 2016-04-16 ENCOUNTER — Telehealth: Payer: Self-pay | Admitting: Genetic Counselor

## 2016-04-16 ENCOUNTER — Other Ambulatory Visit: Payer: Self-pay | Admitting: Family

## 2016-04-16 NOTE — Telephone Encounter (Signed)
Discussed with Renee [name] that her genetic test result was negative for any known pathogenic mutations within any of the 20 genes on the Breast/Ovarian Cancer Panel that would increase her genetic risk for breast, ovarian, or other related cancers.  Discussed that two uncertain changes were found and that both were found on the ATM gene.  Discussed that we just treat these like a negative result and reviewed why we do that.  Encouraged Renee [name] to keep her phone number up-to-date with [place], so that we can call her if/when these VUSes get updated by the lab in the future.  Discussed that this is most likely a reassuring result, especially since so many of Renee [name]'s family members have lived to later ages in life and have never had cancer.  Encouraged her to continue to follow her doctors' cancer screening recommendations.  Let her know that her close female relatives should make their doctors aware of the family history of breast cancer and that they should follow their providers' recommendations for mammograms, breast exams, etc.  Renee [name] is happy to receive this news and she knows she is welcome to call me with any questions.

## 2016-04-19 ENCOUNTER — Ambulatory Visit: Payer: Self-pay | Admitting: Genetic Counselor

## 2016-04-19 DIAGNOSIS — Z1379 Encounter for other screening for genetic and chromosomal anomalies: Secondary | ICD-10-CM

## 2016-04-26 ENCOUNTER — Encounter: Payer: Self-pay | Admitting: Pharmacist

## 2016-04-26 ENCOUNTER — Ambulatory Visit (INDEPENDENT_AMBULATORY_CARE_PROVIDER_SITE_OTHER): Payer: Medicare Other | Admitting: Pharmacist

## 2016-04-26 VITALS — BP 130/80 | HR 77 | Ht 66.0 in | Wt 169.0 lb

## 2016-04-26 DIAGNOSIS — E119 Type 2 diabetes mellitus without complications: Secondary | ICD-10-CM

## 2016-04-26 DIAGNOSIS — E663 Overweight: Secondary | ICD-10-CM

## 2016-04-26 NOTE — Patient Instructions (Signed)
Diabetes and Standards of Medical Care   Diabetes is complicated. You may find that your diabetes team includes a dietitian, nurse, diabetes educator, eye doctor, and more. To help everyone know what is going on and to help you get the care you deserve, the following schedule of care was developed to help keep you on track. Below are the tests, exams, vaccines, medicines, education, and plans you will need.  Blood Glucose Goals Prior to meals = 80 - 130 Within 2 hours of the start of a meal = less than 180  HbA1c test (goal is less than 7.0% - your last value was 7.9%) This test shows how well you have controlled your glucose over the past 2 to 3 months. It is used to see if your diabetes management plan needs to be adjusted.   It is performed at least 2 times a year if you are meeting treatment goals.  It is performed 4 times a year if therapy has changed or if you are not meeting treatment goals.  Blood pressure test  This test is performed at every routine medical visit. The goal is less than 140/90 mmHg for most people, but 130/80 mmHg in some cases. Ask your health care provider about your goal.  Dental exam  Follow up with the dentist regularly.  Eye exam  If you are diagnosed with type 1 diabetes as a child, get an exam upon reaching the age of 68 years or older and have had diabetes for 3 to 5 years. Yearly eye exams are recommended after that initial eye exam.  If you are diagnosed with type 1 diabetes as an adult, get an exam within 5 years of diagnosis and then yearly.  If you are diagnosed with type 2 diabetes, get an exam as soon as possible after the diagnosis and then yearly.  Foot care exam  Visual foot exams are performed at every routine medical visit. The exams check for cuts, injuries, or other problems with the feet.  A comprehensive foot exam should be done yearly. This includes visual inspection as well as assessing foot pulses and testing for loss of  sensation.  Check your feet nightly for cuts, injuries, or other problems with your feet. Tell your health care provider if anything is not healing.  Kidney function test (urine microalbumin)  This test is performed once a year.  Type 1 diabetes: The first test is performed 5 years after diagnosis.  Type 2 diabetes: The first test is performed at the time of diagnosis.  A serum creatinine and estimated glomerular filtration rate (eGFR) test is done once a year to assess the level of chronic kidney disease (CKD), if present.  Lipid profile (cholesterol, HDL, LDL, triglycerides)  Performed every 5 years for most people.  The goal for LDL is less than 100 mg/dL. If you are at high risk, the goal is less than 70 mg/dL.  The goal for HDL is 40 mg/dL to 50 mg/dL for men and 50 mg/dL to 60 mg/dL for women. An HDL cholesterol of 60 mg/dL or higher gives some protection against heart disease.  The goal for triglycerides is less than 150 mg/dL.  Influenza vaccine, pneumococcal vaccine, and hepatitis B vaccine  The influenza vaccine is recommended yearly.  The pneumococcal vaccine is generally given once in a lifetime. However, there are some instances when another vaccination is recommended. Check with your health care provider.  The hepatitis B vaccine is also recommended for adults with diabetes.  Diabetes self-management education  Education is recommended at diagnosis and ongoing as needed.  Treatment plan  Your treatment plan is reviewed at every medical visit.  Document Released: 10/10/2009 Document Revised: 08/15/2013 Document Reviewed: 05/15/2013 ExitCare Patient Information 2014 [employer].   

## 2016-04-26 NOTE — Progress Notes (Signed)
Subjective:    Renee [name] is a 65 y.o. female who presents reevaluation of Type 2 diabetes mellitus.  She was diagnosed with type 2 DM about 2 years ago.   Current symptoms/problems include hyperglycemia - but highest HBG reading was 171  Patients A1c on 12/26/2015 was greater than 14%.    She started Basaglar /Lantus in January at a very low dose.  Her BG readings  improved but patient c/o feeling like her BG is low and having blurry vision. She went to ED because of these feelings.  At our last appt stopped insulin and changed to Januvia 100mg  1 tablet daily.  She is tolerating Januvia well. She was prescribed Trulicity [id number] but she has not picked up and declined to start due to side effects.  She is cautious of all diabetes medications because of past reaction to metformin in 2014.  After 2 to 3 doses of metformin 1000mg  bid patient began to blackout and have seizures.  Metformin was subsequently discontinued and she has not a seizure since   Known diabetic complications: none Cardiovascular risk factors: advanced age (older than 84 for men, 34 for women), diabetes mellitus, dyslipidemia, hypertension and sedentary lifestyle  Eye exam current (within one year): yes - Dr Marin Comment  Weight trend: stable Prior visit with CDE: yes  Current diet:  Patient has added more whole grains and if limiting serving sizes of high CHO containing foods.  She has made great improvements in diet.  Breakfast - no sugar added shredded wheat, 2 eggs + 1 slices of whole grain toast or 1 pkg of oatmeal Lunch - lean, low sodium ham sandwich + salad + water Supper - salad + meat + water Snack - small bowl of low sugar ice cream  Current exercise: walking - daily  Current monitoring regimen: home blood tests - one times daily  Home blood sugar records: 145, 1685, 153, 165, 143, 158, 148, 171, 124, 138, 153, 142, 128, 137, 138 Any episodes of hypoglycemia? no  Is She on ACE inhibitor or angiotensin II receptor  blocker?  No   The following portions of the patient's history were reviewed and updated as appropriate: allergies, current medications, past family history, past medical history, past surgical history and problem list.    Objective:    BP 130/80 mmHg  Pulse 77  Ht 5\' 6"  (1.676 m)  Wt 169 lb (76.658 kg)  BMI 27.29 kg/m2   A1c = 7.9% (03/30/2016) A1c = greater than 14% (12/26/2015) RBG in office today was 140  Lab Review GLUCOSE (mg/dL)  Date Value  03/30/2016 180*  12/26/2015 372*  06/26/2015 111*   GLUCOSE, BLD (mg/dL)  Date Value  01/15/2016 185*  12/31/2013 186*   CO2 (mmol/L)  Date Value  03/30/2016 24  01/15/2016 26  12/26/2015 22   BUN (mg/dL)  Date Value  03/30/2016 17  01/15/2016 19  12/26/2015 13  06/26/2015 15  12/31/2013 23   CREATININE, SER (mg/dL)  Date Value  03/30/2016 0.87  01/15/2016 0.96  12/26/2015 0.96    Assessment:    Diabetes Mellitus type II, under improving control.    Plan:    1.  Rx changes:  None, continue Januvia 100mg  take 1 tablet daily and all other current medications. Patient has declined to start Trulicity due to history of side effects with other medication 2.  Nutrition:  Continue with current CHO counting diet.   3.   Education: Reviewed 'ABCs' of diabetes management (respective goals in  parentheses):  A1C (<7), blood pressure (<130/80), and cholesterol (LDL <100). 4. Continue with current exercise 5.   Follow up: 3 months   Cherre Robins, PharmD, CPP, CDE

## 2016-05-09 DIAGNOSIS — Z1379 Encounter for other screening for genetic and chromosomal anomalies: Secondary | ICD-10-CM | POA: Insufficient documentation

## 2016-05-09 NOTE — Progress Notes (Signed)
GENETIC TEST RESULT  HPI: Renee [name] was previously seen in the [facility] due to a personal history of early-onset breast cancer, family history of cancer, and concerns regarding a hereditary predisposition to cancer. Please refer to our prior cancer genetics clinic note from April 01, 2016 for more information regarding Renee [name]'s medical, social and family histories, and our assessment and recommendations, at the time. Renee [name] recent genetic test results were disclosed to her, as were recommendations warranted by these results. These results and recommendations are discussed in more detail below.  GENETIC TEST RESULTS: At the time of Renee [name]'s visit on 04/01/16, we recommended she pursue genetic testing of the 20-gene Breast/Ovarian Cancer Panel through [facility].  The Breast/Ovarian Cancer Panel offered by [facility] Hope Pigeon, MD) includes sequencing and deletion/duplication analysis for the following 19 genes:  ATM, BARD1, BRCA1, BRCA2, BRIP1, CDH1, CHEK2, FANCC, MLH1, MSH2, MSH6, NBN, PALB2, PMS2, PTEN, RAD51C, RAD51D, TP53, and XRCC2.  This panel also includes deletion/duplication analysis (without sequencing) for one gene, EPCAM.  Those results are now back, the report date for which is April 13, 2016.  Genetic testing was normal, and did not reveal a deleterious mutation in these genes. Two variants of uncertain significance (VUSes) were found in the ATM gene. The test report will be scanned into EPIC and will be located under the Results Review tab in the Pathology>Molecular Pathology section.   Genetic testing did identify two variant of uncertain significance (VUSes) called "c.1073A>G (p.Asn358Ser)" and "c7928-10T>C (IVS53-10T>C)" were both found in the ATM gene. Laboratory internal observations suggest that these two VUSes are on the same copy (in cis) of the ATM gene.  At this time, it is unknown if these VUSes are associated with an  increased risk for cancer or if these are a normal finding. Since these VUS results are uncertain, they cannot help guide screening recommendations, and family members should not be tested for these VUSes to help define their own cancer risks.  Also, we all have variants within our genes that make [place] unique individuals--most of these variants are benign.  Thus, we treat these VUSes as a negative result.   With time, we suspect the lab will reclassify these variant and when they do, we will try to re-contact Renee [name] to discuss the reclassification further.  We also encouraged Renee [name] to contact us in a year or two to obtain an update on the status of these VUSes.  We discussed with Renee [name] that since the current genetic testing is not perfect, it is possible there may be a gene mutation in one of these genes that current testing cannot detect, but that chance is small. We also discussed, that it is possible that another gene that has not yet been discovered, or that we have not yet tested, is responsible for the cancer diagnoses in the family, and it is, therefore, important to remain in touch with cancer genetics in the future so that we can continue to offer Renee [name] the most up to date genetic testing.    CANCER SCREENING RECOMMENDATIONS: This result is reassuring and indicates that Renee [name] likely does not have an increased risk for a future cancer due to a mutation in one of these genes. This normal test also suggests that Renee [name]'s cancer was most likely not due to an inherited predisposition associated with one of these genes.  Most cancers happen by chance and this negative test suggests that her cancer falls  into this category.  We, therefore, recommended she continue to follow the cancer management and screening guidelines provided by her oncology and primary healthcare providers.   RECOMMENDATIONS FOR FAMILY MEMBERS: Women in this family might be at some increased risk of developing  cancer, over the general population risk, simply due to the family history of cancer. We recommended women in this family have a yearly mammogram beginning at age 66, or 76 years younger than the earliest onset of cancer, an an annual clinical breast exam, and perform monthly breast self-exams. Women in this family should also have a gynecological exam as recommended by their primary provider. All family members should have a colonoscopy by age 66.   FOLLOW-UP: Lastly, we discussed with Renee [name] that cancer genetics is a rapidly advancing field and it is possible that new genetic tests will be appropriate for her and/or her family members in the future. We encouraged her to remain in contact with cancer genetics on an annual basis so we can update her personal and family histories and let her know of advances in cancer genetics that may benefit this family.   Our contact number was provided. Renee [name] questions were answered to her satisfaction, and she knows she is welcome to call us at anytime with additional questions or concerns.   Renee Luz, MS, [facility] Certified Genetic Counselor [email or web address] Phone: [phone]

## 2016-05-27 ENCOUNTER — Telehealth: Payer: Self-pay | Admitting: Family

## 2016-05-27 ENCOUNTER — Other Ambulatory Visit: Payer: Self-pay

## 2016-05-27 MED ORDER — GLUCOSE BLOOD VI STRP: ORAL_STRIP | Status: DC

## 2016-06-01 ENCOUNTER — Encounter (HOSPITAL_COMMUNITY): Payer: Self-pay

## 2016-06-07 ENCOUNTER — Other Ambulatory Visit: Payer: Self-pay | Admitting: Pharmacist

## 2016-06-07 MED ORDER — GLUCOSE BLOOD VI STRP: ORAL_STRIP | Status: DC

## 2016-06-28 ENCOUNTER — Ambulatory Visit (INDEPENDENT_AMBULATORY_CARE_PROVIDER_SITE_OTHER): Payer: Medicare Other | Admitting: Family

## 2016-06-28 ENCOUNTER — Encounter: Payer: Self-pay | Admitting: Family

## 2016-06-28 VITALS — BP 112/66 | HR 61 | Temp 97.0°F | Ht 66.0 in | Wt 174.2 lb

## 2016-06-28 DIAGNOSIS — A499 Bacterial infection, unspecified: Secondary | ICD-10-CM | POA: Diagnosis not present

## 2016-06-28 DIAGNOSIS — H1089 Other conjunctivitis: Secondary | ICD-10-CM

## 2016-06-28 DIAGNOSIS — H109 Unspecified conjunctivitis: Secondary | ICD-10-CM

## 2016-06-28 MED ORDER — POLYMYXIN B-TRIMETHOPRIM 10000-0.1 UNIT/ML-% OP SOLN: 1.0000 [drp] | OPHTHALMIC | Status: DC

## 2016-06-28 NOTE — Patient Instructions (Signed)

## 2016-06-28 NOTE — Progress Notes (Signed)
   Subjective:    Patient ID: Renee [name], female    DOB: Jun 16, 1951, 65 y.o.   MRN: [id number]  Conjunctivitis  The current episode started yesterday. The onset was sudden. The problem occurs continuously. The problem has been unchanged. The problem is moderate. The symptoms are relieved by one or more OTC medications. The symptoms are aggravated by movement and light. Associated symptoms include decreased vision, eye itching, eye discharge and eye redness. Pertinent negatives include no photophobia, no headaches, no hearing loss, no rhinorrhea and no stridor. The left eye is affected.      Review of Systems  Constitutional: Negative.   HENT: Negative.  Negative for hearing loss and rhinorrhea.   Eyes: Positive for discharge, redness and itching. Negative for photophobia.  Respiratory: Negative.  Negative for shortness of breath and stridor.   Cardiovascular: Negative.  Negative for palpitations.  Gastrointestinal: Negative.   Endocrine: Negative.   Genitourinary: Negative.   Musculoskeletal: Negative.   Neurological: Negative.  Negative for headaches.  Hematological: Negative.   Psychiatric/Behavioral: Negative.   All other systems reviewed and are negative.      Objective:   Physical Exam  Constitutional: She is oriented to person, place, and time. She appears well-developed and well-nourished. No distress.  HENT:  Head: Normocephalic and atraumatic.  Right Ear: External ear normal.  Mouth/Throat: Oropharynx is clear and moist.  Eyes: Pupils are equal, round, and reactive to light. Left eye exhibits discharge and exudate. Left conjunctiva has a hemorrhage.  Neck: Normal range of motion. Neck supple. No thyromegaly present.  Cardiovascular: Normal rate, regular rhythm, normal heart sounds and intact distal pulses.   No murmur heard. Pulmonary/Chest: Effort normal and breath sounds normal. No respiratory distress. She has no wheezes.  Abdominal: Soft. Bowel sounds are  normal. She exhibits no distension. There is no tenderness.  Musculoskeletal: Normal range of motion. She exhibits no edema or tenderness.  Neurological: She is alert and oriented to person, place, and time. She has normal reflexes. No cranial nerve deficit.  Skin: Skin is warm and dry.  Psychiatric: She has a normal mood and affect. Her behavior is normal. Judgment and thought content normal.  Vitals reviewed.   BP 112/66 mmHg  Pulse 61  Temp(Src) 97 F (36.1 C) (Oral)  Ht 5\' 6"  (1.676 m)  Wt 174 lb 3.2 oz (79.017 kg)  BMI 28.13 kg/m2       Assessment & Plan:  1. Bacterial conjunctivitis -Do not rub or scratch eye -Good hand hygiene discussed -Warm compresses RTO prn - trimethoprim-polymyxin b (POLYTRIM) ophthalmic solution; Place 1 drop into the left eye every 4 (four) hours.  Dispense: 10 mL; Refill: 0  Renee Dun, Renee [name]

## 2016-07-05 ENCOUNTER — Encounter: Payer: Self-pay | Admitting: Pharmacist

## 2016-07-05 ENCOUNTER — Ambulatory Visit (INDEPENDENT_AMBULATORY_CARE_PROVIDER_SITE_OTHER): Payer: Medicare Other | Admitting: Pharmacist

## 2016-07-05 VITALS — BP 120/70 | HR 68 | Ht 66.0 in | Wt 170.0 lb

## 2016-07-05 DIAGNOSIS — IMO0002 Reserved for concepts with insufficient information to code with codable children: Secondary | ICD-10-CM | POA: Insufficient documentation

## 2016-07-05 DIAGNOSIS — E119 Type 2 diabetes mellitus without complications: Secondary | ICD-10-CM | POA: Diagnosis not present

## 2016-07-05 DIAGNOSIS — E1165 Type 2 diabetes mellitus with hyperglycemia: Secondary | ICD-10-CM | POA: Insufficient documentation

## 2016-07-05 DIAGNOSIS — Z0000 Encounter for general adult medical examination without abnormal findings: Secondary | ICD-10-CM

## 2016-07-05 DIAGNOSIS — E663 Overweight: Secondary | ICD-10-CM | POA: Diagnosis not present

## 2016-07-05 DIAGNOSIS — E1140 Type 2 diabetes mellitus with diabetic neuropathy, unspecified: Secondary | ICD-10-CM

## 2016-07-05 LAB — BAYER DCA HB A1C WAIVED: HB A1C (BAYER DCA - WAIVED): 7.5 % — ABNORMAL HIGH (ref <7.0)

## 2016-07-05 MED ORDER — SIMVASTATIN 40 MG PO TABS: ORAL_TABLET | ORAL | Status: DC

## 2016-07-05 NOTE — Progress Notes (Signed)
Patient ID: Renee [name], female   DOB: August 07, 1951, 65 y.o.   MRN: [id number]    Subjective:   Renee [name] is a 65 y.o. female who presents for an Initial Medicare Annual Wellness Visit and to recheck type 2 DM.   HBG readings - 137, 138, 127, 145, 142, 146, 137, 130, 103, 151, 138, 134, 184  Review of Systems  Review of Systems  Constitutional: Negative.   Eyes: Negative.   Respiratory: Negative.   Cardiovascular: Negative.   Gastrointestinal: Negative.   Genitourinary: Negative.   Musculoskeletal: Positive for joint pain.  Skin: Negative.   Neurological: Negative.   Endo/Heme/Allergies: Negative.   Psychiatric/Behavioral: Negative.      Current Medications (verified) Outpatient Encounter Prescriptions as of 07/05/2016  Medication Sig  . aspirin 81 MG tablet Take 81 mg by mouth daily.  . bisoprolol-hydrochlorothiazide (ZIAC) 2.5-6.25 MG per tablet Take 1 tablet by mouth daily.  . Blood Glucose Calibration (ONETOUCH VERIO) SOLN Use as needed to calibrate glucometer  . docusate sodium (COLACE) 100 MG capsule Take 100 mg by mouth daily.  Marland Kitchen gabapentin (NEURONTIN) 300 MG capsule Take 1 capsule (300 mg total) by mouth 3 (three) times daily.  Marland Kitchen JANUVIA 100 MG tablet TAKE ONE TABLET BY MOUTH ONCE DAILY  . Multiple Vitamin (MULTIVITAMIN WITH MINERALS) TABS tablet Take 1 tablet by mouth daily.  Marland Kitchen omeprazole (PRILOSEC) 20 MG capsule Take 1 capsule (20 mg total) by mouth daily.  [name] DELICA LANCETS [id number] MISC Use to check BG up to bid.  Dx: E11.65 type 2 DM with insulin therapy, uncontrolled  . simvastatin (ZOCOR) 40 MG tablet TAKE 1 TABLET (40 MG TOTAL) BY MOUTH AT BEDTIME.  Marland Kitchen trimethoprim-polymyxin b (POLYTRIM) ophthalmic solution Place 1 drop into the left eye every 4 (four) hours.  . [DISCONTINUED] simvastatin (ZOCOR) 40 MG tablet TAKE 1 TABLET (40 MG TOTAL) BY MOUTH AT BEDTIME.  . [DISCONTINUED] BD PEN NEEDLE NANO U/F 32G X 4 MM MISC Reported on 07/05/2016   No  facility-administered encounter medications on file as of 07/05/2016.    Allergies (verified) Metformin and related   History: Past Medical History  Diagnosis Date  . Hypertension   . GERD (gastroesophageal reflux disease)   . Vitamin D deficiency   . Hyperlipidemia   . Arthritis   . Heart murmur   . CVA (cerebral infarction) 2003, 2005  . Seizures ([place])   . DM (diabetes mellitus) ([place])   . Breast cancer [facility]) 2001    right mastectomy   Past Surgical History  Procedure Laterality Date  . Mastectomy  2001    right  . Abdominal hysterectomy  2000  . Ganglion cyst excision  2010    right hand  . Tonsilectomy/adenoidectomy with myringotomy Bilateral     age 21's   Family History  Problem Relation Age of Onset  . Colon cancer Neg Hx   . Stomach cancer Neg Hx   . Alzheimer's disease Mother   . Parkinson's disease Mother   . Heart Problems Father   . Kidney disease Father     +dialysis  . Heart disease Father   . Other Sister     +TAH for fibroids  . Cancer Paternal Aunt     NOS cancer  . Breast cancer Sister     dx. 67-56  . Renal cancer Sister     dx. 62-63; not a smoker  . Early death Brother    Social History   Occupational History  .  Not on file.   Social History Main Topics  . Smoking status: Never Smoker   . Smokeless tobacco: Never Used  . Alcohol Use: No  . Drug Use: No  . Sexual Activity: Not Currently    Birth Control/ Protection: Post-menopausal    Do you feel safe at home?  Yes Are there smokers in your home (other than you)? No  Dietary issues and exercise activities: Current Exercise Habits: Home exercise routine, Type of exercise: walking;calisthenics;stretching, Time (Minutes): 20, Frequency (Times/Week): 7, Weekly Exercise (Minutes/Week): 140, Intensity: Mild  Current Dietary habits:  Patient is following CHO counting diet.    Objective:    Today's Vitals   07/05/16 1017  BP: 120/70  Pulse: 68  Height: 5\' 6"  (1.676 m)  Weight:  170 lb (77.111 kg)  PainSc: 0-No pain   Body mass index is 27.45 kg/(m^2).  Activities of Daily Living In your present state of health, do you have any difficulty performing the following activities: 07/05/2016 06/28/2016  Hearing? N N  Vision? N N  Difficulty concentrating or making decisions? N N  Walking or climbing stairs? N N  Dressing or bathing? N N  Doing errands, shopping? N N  Preparing Food and eating ? N -  Using the Toilet? N -  In the past six months, have you accidently leaked urine? N -  Do you have problems with loss of bowel control? N -  Managing your Medications? N -  Managing your Finances? N -  Housekeeping or managing your Housekeeping? N -     Cardiac Risk Factors include: advanced age (>30men, >13 women);diabetes mellitus;dyslipidemia;hypertension  Depression Screen PHQ 2/9 Scores 07/05/2016 06/28/2016 03/30/2016 12/26/2015  PHQ - 2 Score 0 0 0 0     Fall Risk Fall Risk  07/05/2016 06/28/2016 03/30/2016 06/26/2015 10/29/2014  Falls in the past year? No No No No No    Cognitive Function: MMSE - Mini Mental State Exam 07/05/2016 07/05/2016  Orientation to time 5 -  Orientation to Place 5 -  Registration 3 3  Attention/ Calculation 5 -  Recall 2 -  Language- name 2 objects 2 -  Language- repeat 1 -  Language- follow 3 step command 3 -  Language- read & follow direction 1 -  Write a sentence 1 -  Copy design 1 -  Total score 29 -    Immunizations and Health Maintenance  There is no immunization history on file for this patient. Health Maintenance Due  Topic Date Due  . ZOSTAVAX  03/01/2010  . PNA vac Low Risk Adult (1 of 2 - PCV13) 03/02/2015  . URINE MICROALBUMIN  06/25/2016    Patient Care Team: Sharion Balloon, FNP as PCP - General (Nurse Practitioner) Ludwig Clarks, DO as Consulting Physician (Neurology) Jerene Bears, MD as Consulting Physician (Gastroenterology)  Indicate any recent Medical Services you may have received from other than [facility]  providers in the past year (date may be approximate).    Assessment:    Annual Wellness Visit  Type 2 DM - controlled   Screening Tests Health Maintenance  Topic Date Due  . ZOSTAVAX  03/01/2010  . PNA vac Low Risk Adult (1 of 2 - PCV13) 03/02/2015  . URINE MICROALBUMIN  06/25/2016  . INFLUENZA VACCINE  10/29/2018 (Originally 07/27/2016)  . OPHTHALMOLOGY EXAM  11/26/2016  . HEMOGLOBIN A1C  01/05/2017  . FOOT EXAM  03/30/2017  . MAMMOGRAM  12/25/2017  . COLONOSCOPY  08/17/2022  . TETANUS/TDAP  12/27/2022  . DEXA SCAN  Completed  . Hepatitis C Screening  Completed        Plan:   During the course of the visit Renee [name] was educated and counseled about the following appropriate screening and preventive services:   Vaccines to include Pneumoccal, Influenza, Td, Zostavax - patient refuses all vaccines  Colorectal cancer screening - UTD  Cardiovascular disease screening - Last ECHO was 2014 (normal) and last EKG 2017   Diabetes - continue Janvia 100mg  qd  Bone Denisty / Osteoporosis Screening - UTD  Mammogram - UTD; gets yearly; right side mastectomy  Glaucoma screening / Diabetic Eye Exam - UTD  Nutrition counseling - continue to follow CHO counting diet.  We did discuss decreasing serving size of ice cream and to only have as an occ treat (she had ice cream when BG was 184)  Advanced Directives - information provided and discussed  Physical Activity - continue to stay active - goal is to exercise 150 minutes weekly.  Rx sent in for simvastatin  Orders Placed This Encounter  Procedures  . Bayer DCA Hb A1c Waived  . Microalbumin / creatinine urine ratio  . Thyroid Panel With TSH      Patient Instructions (the written plan) were given to the patient.   Cherre Robins, [facility]   07/06/2016

## 2016-07-05 NOTE — Patient Instructions (Addendum)
Renee [name] , Thank you for taking time to come for your Medicare Wellness Visit. I appreciate your ongoing commitment to your health goals. Please review the following plan we discussed and let me know if I can assist you in the future.   These are the goals we discussed: Continue to stay active - goal is to exercise at least 150 minutes each week. Continue to limit high sugar and carbohydrate containing foods - your A1c continues to improve - was 7.5% today (goal is less than 7.0%)  Increase non-starchy vegetables - carrots, green bean, squash, zucchini, tomatoes, onions, peppers, spinach and other green leafy vegetables, cabbage, lettuce, cucumbers, asparagus, okra (not fried), eggplant Limit sugar and processed foods (cakes, cookies, ice cream, crackers and chips) Increase fresh fruit but limit serving sizes 1/2 cup or about the size of tennis or baseball Limit red meat to no more than 1-2 times per week (serving size about the size of your palm) Choose whole grains / lean proteins - whole wheat bread, quinoa, whole grain rice (1/2 cup), fish, chicken, [place] Avoid sugar and calorie containing beverages - soda, sweet tea and juice.  Choose water or unsweetened tea instead.   This is a list of the screening recommended for you and due dates:  Health Maintenance  Topic Date Due  . Shingles Vaccine  03/01/2010 - postponed  . Pneumonia vaccines (1 of 2 - PCV13) 03/02/2015 - postponed  . Urine Protein Check  Done today  . Flu Shot  10/29/2018*  . Hemoglobin A1C  Done today  . Eye exam for diabetics  11/26/2016  . Complete foot exam   03/30/2017  . Mammogram  12/25/2017  . Colon Cancer Screening  08/17/2022  . Tetanus Vaccine  12/27/2022  . DEXA scan (bone density measurement)  Completed  .  Hepatitis C: One time screening is recommended by Center for Disease Control  (CDC) for  adults born from [birth year] through 1965.   Completed  *Topic was postponed. The date shown is not the original due  date.    Health Maintenance, Female Adopting a healthy lifestyle and getting preventive care can go a long way to promote health and wellness. Talk with your health care provider about what schedule of regular examinations is right for you. This is a good chance for you to check in with your provider about disease prevention and staying healthy. In between checkups, there are plenty of things you can do on your own. Experts have done a lot of research about which lifestyle changes and preventive measures are most likely to keep you healthy. Ask your health care provider for more information. WEIGHT AND DIET  Eat a healthy diet  Be sure to include plenty of vegetables, fruits, low-fat dairy products, and lean protein.  Do not eat a lot of foods high in solid fats, added sugars, or salt.  Get regular exercise. This is one of the most important things you can do for your health.  Most adults should exercise for at least 150 minutes each week. The exercise should increase your heart rate and make you sweat (moderate-intensity exercise).  Most adults should also do strengthening exercises at least twice a week. This is in addition to the moderate-intensity exercise.  Maintain a healthy weight  Body mass index (BMI) is a measurement that can be used to identify possible weight problems. It estimates body fat based on height and weight. Your health care provider can help determine your BMI and help you achieve  or maintain a healthy weight.  For females 53 years of age and older:   A BMI below 18.5 is considered underweight.  A BMI of 18.5 to 24.9 is normal.  A BMI of 25 to 29.9 is considered overweight.  A BMI of 30 and above is considered obese.  Watch levels of cholesterol and blood lipids  You should start having your blood tested for lipids and cholesterol at 66 years of age, then have this test every 5 years.  You may need to have your cholesterol levels checked more often  if:  Your lipid or cholesterol levels are high.  You are older than 66 years of age.  You are at high risk for heart disease.  CANCER SCREENING   Lung Cancer  Lung cancer screening is recommended for adults 52-34 years old who are at high risk for lung cancer because of a history of smoking.  A yearly low-dose CT scan of the lungs is recommended for people who:  Currently smoke.  Have quit within the past 15 years.  Have at least a 30-pack-year history of smoking. A pack year is smoking an average of one pack of cigarettes a day for 1 year.  Yearly screening should continue until it has been 15 years since you quit.  Yearly screening should stop if you develop a health problem that would prevent you from having lung cancer treatment.  Breast Cancer  Practice breast self-awareness. This means understanding how your breasts normally appear and feel.  It also means doing regular breast self-exams. Let your health care provider know about any changes, no matter how small.  If you are in your 20s or 30s, you should have a clinical breast exam (CBE) by a health care provider every 1-3 years as part of a regular health exam.  If you are 59 or older, have a CBE every year. Also consider having a breast X-ray (mammogram) every year.  If you have a family history of breast cancer, talk to your health care provider about genetic screening.  If you are at high risk for breast cancer, talk to your health care provider about having an MRI and a mammogram every year.  Breast cancer gene (BRCA) assessment is recommended for women who have family members with BRCA-related cancers. BRCA-related cancers include:  Breast.  Ovarian.  Tubal.  Peritoneal cancers.  Results of the assessment will determine the need for genetic counseling and BRCA1 and BRCA2 testing. Cervical Cancer Your health care provider may recommend that you be screened regularly for cancer of the pelvic organs  (ovaries, uterus, and vagina). This screening involves a pelvic examination, including checking for microscopic changes to the surface of your cervix (Pap test). You may be encouraged to have this screening done every 3 years, beginning at age 41.  For women ages 28-65, health care providers may recommend pelvic exams and Pap testing every 3 years, or they may recommend the Pap and pelvic exam, combined with testing for human papilloma virus (HPV), every 5 years. Some types of HPV increase your risk of cervical cancer. Testing for HPV may also be done on women of any age with unclear Pap test results.  Other health care providers may not recommend any screening for nonpregnant women who are considered low risk for pelvic cancer and who do not have symptoms. Ask your health care provider if a screening pelvic exam is right for you.  If you have had past treatment for cervical cancer or a condition  that could lead to cancer, you need Pap tests and screening for cancer for at least 20 years after your treatment. If Pap tests have been discontinued, your risk factors (such as having a new sexual partner) need to be reassessed to determine if screening should resume. Some women have medical problems that increase the chance of getting cervical cancer. In these cases, your health care provider may recommend more frequent screening and Pap tests. Colorectal Cancer  This type of cancer can be detected and often prevented.  Routine colorectal cancer screening usually begins at 66 years of age and continues through 66 years of age.  Your health care provider may recommend screening at an earlier age if you have risk factors for colon cancer.  Your health care provider may also recommend using home test kits to check for hidden blood in the stool.  A small camera at the end of a tube can be used to examine your colon directly (sigmoidoscopy or colonoscopy). This is done to check for the earliest forms of  colorectal cancer.  Routine screening usually begins at age 39.  Direct examination of the colon should be repeated every 5-10 years through 66 years of age. However, you may need to be screened more often if early forms of precancerous polyps or small growths are found. Skin Cancer  Check your skin from head to toe regularly.  Tell your health care provider about any new moles or changes in moles, especially if there is a change in a mole's shape or color.  Also tell your health care provider if you have a mole that is larger than the size of a pencil eraser.  Always use sunscreen. Apply sunscreen liberally and repeatedly throughout the day.  Protect yourself by wearing long sleeves, pants, a wide-brimmed hat, and sunglasses whenever you are outside. HEART DISEASE, DIABETES, AND HIGH BLOOD PRESSURE   High blood pressure causes heart disease and increases the risk of stroke. High blood pressure is more likely to develop in:  People who have blood pressure in the high end of the normal range (130-139/85-89 mm Hg).  People who are overweight or obese.  People who are African American.  If you are 68-90 years of age, have your blood pressure checked every 3-5 years. If you are 2 years of age or older, have your blood pressure checked every year. You should have your blood pressure measured twice--once when you are at a hospital or clinic, and once when you are not at a hospital or clinic. Record the average of the two measurements. To check your blood pressure when you are not at a hospital or clinic, you can use:  An automated blood pressure machine at a pharmacy.  A home blood pressure monitor.  If you are between 102 years and 40 years old, ask your health care provider if you should take aspirin to prevent strokes.  Have regular diabetes screenings. This involves taking a blood sample to check your fasting blood sugar level.  If you are at a normal weight and have a low risk for  diabetes, have this test once every three years after 66 years of age.  If you are overweight and have a high risk for diabetes, consider being tested at a younger age or more often. PREVENTING INFECTION  Hepatitis B  If you have a higher risk for hepatitis B, you should be screened for this virus. You are considered at high risk for hepatitis B if:  You were born  in a country where hepatitis B is common. Ask your health care provider which countries are considered high risk.  Your parents were born in a high-risk country, and you have not been immunized against hepatitis B (hepatitis B vaccine).  You have HIV or AIDS.  You use needles to inject street drugs.  You live with someone who has hepatitis B.  You have had sex with someone who has hepatitis B.  You get hemodialysis treatment.  You take certain medicines for conditions, including cancer, organ transplantation, and autoimmune conditions. Hepatitis C  Blood testing is recommended for:  Everyone born from [birth year] through 1966.  Anyone with known risk factors for hepatitis C. Sexually transmitted infections (STIs)  You should be screened for sexually transmitted infections (STIs) including gonorrhea and chlamydia if:  You are sexually active and are younger than 67 years of age.  You are older than 66 years of age and your health care provider tells you that you are at risk for this type of infection.  Your sexual activity has changed since you were last screened and you are at an increased risk for chlamydia or gonorrhea. Ask your health care provider if you are at risk.  If you do not have HIV, but are at risk, it may be recommended that you take a prescription medicine daily to prevent HIV infection. This is called pre-exposure prophylaxis (PrEP). You are considered at risk if:  You are sexually active and do not regularly use condoms or know the HIV status of your partner(s).  You take drugs by injection.  You are  sexually active with a partner who has HIV. Talk with your health care provider about whether you are at high risk of being infected with HIV. If you choose to begin PrEP, you should first be tested for HIV. You should then be tested every 3 months for as long as you are taking PrEP.  PREGNANCY   If you are premenopausal and you may become pregnant, ask your health care provider about preconception counseling.  If you may become pregnant, take 400 to 800 micrograms (mcg) of folic acid every day.  If you want to prevent pregnancy, talk to your health care provider about birth control (contraception). OSTEOPOROSIS AND MENOPAUSE   Osteoporosis is a disease in which the bones lose minerals and strength with aging. This can result in serious bone fractures. Your risk for osteoporosis can be identified using a bone density scan.  If you are 20 years of age or older, or if you are at risk for osteoporosis and fractures, ask your health care provider if you should be screened.  Ask your health care provider whether you should take a calcium or vitamin D supplement to lower your risk for osteoporosis.  Menopause may have certain physical symptoms and risks.  Hormone replacement therapy may reduce some of these symptoms and risks. Talk to your health care provider about whether hormone replacement therapy is right for you.  HOME CARE INSTRUCTIONS   Schedule regular health, dental, and eye exams.  Stay current with your immunizations.   Do not use any tobacco products including cigarettes, chewing tobacco, or electronic cigarettes.  If you are pregnant, do not drink alcohol.  If you are breastfeeding, limit how much and how often you drink alcohol.  Limit alcohol intake to no more than 1 drink per day for nonpregnant women. One drink equals 12 ounces of beer, 5 ounces of wine, or 1 ounces of hard liquor.  Do not use street drugs.  Do not share needles.  Ask your health care provider for  help if you need support or information about quitting drugs.  Tell your health care provider if you often feel depressed.  Tell your health care provider if you have ever been abused or do not feel safe at home.   This information is not intended to replace advice given to you by your health care provider. Make sure you discuss any questions you have with your health care provider.   Document Released: 06/28/2011 Document Revised: 01/03/2015 Document Reviewed: 11/14/2013 Elsevier Interactive Patient Education [employer].

## 2016-07-06 LAB — THYROID PANEL WITH TSH
Free Thyroxine Index: 1.6 (ref 1.2–4.9)
T3 Uptake Ratio: 27 % (ref 24–39)
T4 TOTAL: 6.1 ug/dL (ref 4.5–12.0)
TSH: 2.71 u[IU]/mL (ref 0.450–4.500)

## 2016-07-06 LAB — MICROALBUMIN / CREATININE URINE RATIO
CREATININE, UR: 187.2 mg/dL
MICROALB/CREAT RATIO: 4.4 mg/g creat (ref 0.0–30.0)
MICROALBUM., U, RANDOM: 8.3 ug/mL

## 2016-07-09 ENCOUNTER — Other Ambulatory Visit: Payer: Self-pay | Admitting: Family

## 2016-07-13 ENCOUNTER — Other Ambulatory Visit: Payer: Self-pay | Admitting: Family

## 2016-07-20 ENCOUNTER — Other Ambulatory Visit: Payer: Self-pay | Admitting: Family

## 2016-07-20 DIAGNOSIS — I10 Essential (primary) hypertension: Secondary | ICD-10-CM

## 2016-08-31 ENCOUNTER — Other Ambulatory Visit: Payer: Self-pay | Admitting: Family

## 2016-08-31 DIAGNOSIS — E1142 Type 2 diabetes mellitus with diabetic polyneuropathy: Secondary | ICD-10-CM

## 2016-09-21 LAB — HM DIABETES EYE EXAM

## 2016-09-30 ENCOUNTER — Encounter: Payer: Self-pay | Admitting: Family

## 2016-09-30 ENCOUNTER — Ambulatory Visit (INDEPENDENT_AMBULATORY_CARE_PROVIDER_SITE_OTHER): Payer: Medicare Other | Admitting: Family

## 2016-09-30 VITALS — BP 140/77 | HR 67 | Temp 97.4°F | Ht 66.0 in | Wt 180.0 lb

## 2016-09-30 DIAGNOSIS — I10 Essential (primary) hypertension: Secondary | ICD-10-CM | POA: Diagnosis not present

## 2016-09-30 DIAGNOSIS — E1142 Type 2 diabetes mellitus with diabetic polyneuropathy: Secondary | ICD-10-CM | POA: Diagnosis not present

## 2016-09-30 DIAGNOSIS — E1140 Type 2 diabetes mellitus with diabetic neuropathy, unspecified: Secondary | ICD-10-CM

## 2016-09-30 DIAGNOSIS — K2100 Gastro-esophageal reflux disease with esophagitis, without bleeding: Secondary | ICD-10-CM

## 2016-09-30 DIAGNOSIS — E785 Hyperlipidemia, unspecified: Secondary | ICD-10-CM | POA: Diagnosis not present

## 2016-09-30 DIAGNOSIS — E1165 Type 2 diabetes mellitus with hyperglycemia: Secondary | ICD-10-CM

## 2016-09-30 DIAGNOSIS — Z8673 Personal history of transient ischemic attack (TIA), and cerebral infarction without residual deficits: Secondary | ICD-10-CM | POA: Diagnosis not present

## 2016-09-30 DIAGNOSIS — IMO0002 Reserved for concepts with insufficient information to code with codable children: Secondary | ICD-10-CM

## 2016-09-30 LAB — BAYER DCA HB A1C WAIVED: HB A1C (BAYER DCA - WAIVED): 8.5 % — ABNORMAL HIGH (ref <7.0)

## 2016-09-30 NOTE — Patient Instructions (Signed)

## 2016-09-30 NOTE — Progress Notes (Signed)
Subjective:    Patient ID: Renee [name], female    DOB: 06-May-1951, 65 y.o.   MRN: [id number]   Pt presents to the office today for chronic follow up.  Diabetes  She presents for her follow-up diabetic visit. She has type 2 diabetes mellitus. Her disease course has been stable. There are no hypoglycemic associated symptoms. Pertinent negatives for hypoglycemia include no headaches. Associated symptoms include foot paresthesias. Pertinent negatives for diabetes include no blurred vision and no visual change. There are no hypoglycemic complications. Symptoms are stable. Diabetic complications include a CVA and peripheral neuropathy. Pertinent negatives for diabetic complications include no heart disease or nephropathy. Risk factors for coronary artery disease include family history, dyslipidemia, diabetes mellitus, hypertension, obesity, sedentary lifestyle and post-menopausal. Current diabetic treatment includes diet. She is following a generally healthy diet. Her breakfast blood glucose range is generally 180-200 mg/dl. An ACE inhibitor/angiotensin II receptor blocker is being taken. Eye exam is current.  Hypertension  This is a chronic problem. The current episode started more than 1 year ago. The problem has been resolved since onset. The problem is controlled. Pertinent negatives include no blurred vision, headaches, palpitations, peripheral edema or shortness of breath. Risk factors for coronary artery disease include dyslipidemia, family history, diabetes mellitus, obesity, post-menopausal state and sedentary lifestyle. Past treatments include beta blockers and diuretics. The current treatment provides significant improvement. Hypertensive end-organ damage includes CVA. There is no history of kidney disease, CAD/MI, heart failure or a thyroid problem. There is no history of sleep apnea.  Hyperlipidemia  This is a chronic problem. The current episode started more than 1 year ago. The problem is  controlled. Recent lipid tests were reviewed and are normal. Exacerbating diseases include diabetes. She has no history of hypothyroidism. Pertinent negatives include no shortness of breath. Current antihyperlipidemic treatment includes statins. The current treatment provides moderate improvement of lipids. Risk factors for coronary artery disease include diabetes mellitus, dyslipidemia, family history, hypertension, obesity and post-menopausal.  Gastroesophageal Reflux  She reports no belching, no coughing or no heartburn. This is a chronic problem. The current episode started more than 1 year ago. The problem occurs rarely. The problem has been resolved. The symptoms are aggravated by certain foods. She has tried a PPI for the symptoms. The treatment provided significant relief.  Peripheral Neuropathy Pt states she is having burning and pain in her bilateral feet and left hand. PT states she is having intermittent burning pain of 7 out 10. Pt is taking gabapentin 300 mg TID.     Review of Systems  Constitutional: Negative.   HENT: Negative.   Eyes: Negative.  Negative for blurred vision.  Respiratory: Negative.  Negative for cough and shortness of breath.   Cardiovascular: Negative.  Negative for palpitations.  Gastrointestinal: Negative.  Negative for heartburn.  Endocrine: Negative.   Genitourinary: Negative.   Musculoskeletal: Negative.   Neurological: Negative.  Negative for headaches.  Hematological: Negative.   Psychiatric/Behavioral: Negative.   All other systems reviewed and are negative.      Objective:   Physical Exam  Constitutional: She is oriented to person, place, and time. She appears well-developed and well-nourished. No distress.  HENT:  Head: Normocephalic and atraumatic.  Right Ear: External ear normal.  Left Ear: External ear normal.  Nose: Nose normal.  Mouth/Throat: Oropharynx is clear and moist.  Eyes: Pupils are equal, round, and reactive to light.  Neck:  Normal range of motion. Neck supple. No thyromegaly present.  Cardiovascular: Normal rate,  regular rhythm, normal heart sounds and intact distal pulses.   No murmur heard. Pulmonary/Chest: Effort normal and breath sounds normal. No respiratory distress. She has no wheezes.  Abdominal: Soft. Bowel sounds are normal. She exhibits no distension. There is no tenderness.  Musculoskeletal: Normal range of motion. She exhibits no edema or tenderness.  Neurological: She is alert and oriented to person, place, and time. She has normal reflexes. No cranial nerve deficit.  Skin: Skin is warm and dry.  Psychiatric: She has a normal mood and affect. Her behavior is normal. Judgment and thought content normal.  Vitals reviewed.     BP 140/77   Pulse 67   Temp 97.4 F (36.3 C) (Oral)   Ht '5\' 6"'  (1.676 m)   Wt 180 lb (81.6 kg)   BMI 29.05 kg/m      Assessment & Plan:  1. Essential hypertension - CMP14+EGFR  2. Gastroesophageal reflux disease with esophagitis - CMP14+EGFR  3. Uncontrolled type 2 diabetes mellitus with diabetic neuropathy, without long-term current use of insulin (HCC) - Bayer DCA Hb A1c Waived - CMP14+EGFR  4. History of CVA (cerebrovascular accident) - CMP14+EGFR  5. Hyperlipidemia, unspecified hyperlipidemia type - CMP14+EGFR - Lipid panel  6. Diabetic peripheral neuropathy ([place]) - CMP14+EGFR   Continue all meds Labs pending Health Maintenance reviewed Diet and exercise encouraged RTO 3 months  Evelina Dun, FNP

## 2016-10-01 ENCOUNTER — Other Ambulatory Visit: Payer: Self-pay | Admitting: Family

## 2016-10-01 LAB — CMP14+EGFR
A/G RATIO: 1.5 (ref 1.2–2.2)
ALT: 28 IU/L (ref 0–32)
AST: 28 IU/L (ref 0–40)
Albumin: 4.5 g/dL (ref 3.6–4.8)
Alkaline Phosphatase: 94 IU/L (ref 39–117)
BUN/Creatinine Ratio: 15 (ref 12–28)
BUN: 14 mg/dL (ref 8–27)
Bilirubin Total: 0.4 mg/dL (ref 0.0–1.2)
CALCIUM: 9.6 mg/dL (ref 8.7–10.3)
CO2: 24 mmol/L (ref 18–29)
Chloride: 100 mmol/L (ref 96–106)
Creatinine, Ser: 0.91 mg/dL (ref 0.57–1.00)
GFR, EST AFRICAN AMERICAN: 76 mL/min/{1.73_m2} (ref >59)
GFR, EST NON AFRICAN AMERICAN: 66 mL/min/{1.73_m2} (ref >59)
GLOBULIN, TOTAL: 3 g/dL (ref 1.5–4.5)
Glucose: 200 mg/dL — ABNORMAL HIGH (ref 65–99)
POTASSIUM: 4.7 mmol/L (ref 3.5–5.2)
SODIUM: 139 mmol/L (ref 134–144)
TOTAL PROTEIN: 7.5 g/dL (ref 6.0–8.5)

## 2016-10-01 LAB — LIPID PANEL
CHOL/HDL RATIO: 3.3 ratio (ref 0.0–4.4)
Cholesterol, Total: 160 mg/dL (ref 100–199)
HDL: 49 mg/dL (ref >39)
LDL Calculated: 81 mg/dL (ref 0–99)
Triglycerides: 151 mg/dL — ABNORMAL HIGH (ref 0–149)
VLDL Cholesterol Cal: 30 mg/dL (ref 5–40)

## 2016-10-01 MED ORDER — DULAGLUTIDE 0.75 MG/0.5ML ~~LOC~~ SOAJ: 0.7500 mg | SUBCUTANEOUS | 3 refills | Status: DC

## 2016-10-04 ENCOUNTER — Telehealth: Payer: Self-pay | Admitting: Family

## 2016-10-04 ENCOUNTER — Other Ambulatory Visit: Payer: Self-pay | Admitting: Family

## 2016-10-04 MED ORDER — CANAGLIFLOZIN 100 MG PO TABS: 100.0000 mg | ORAL_TABLET | Freq: Every day | ORAL | 3 refills | Status: DC

## 2016-10-04 MED ORDER — EMPAGLIFLOZIN 10 MG PO TABS
10.0000 mg | ORAL_TABLET | Freq: Every day | ORAL | 2 refills | Status: DC
Start: 2016-10-04 — End: 2016-11-16

## 2016-10-04 NOTE — Telephone Encounter (Signed)
Patient will check with pharmacy for price of jardiance and call us if it is too expensive also.

## 2016-10-04 NOTE — Telephone Encounter (Signed)
Patient of Alyse Low. Please advise and route to [facility]

## 2016-10-05 ENCOUNTER — Other Ambulatory Visit: Payer: Self-pay | Admitting: Family

## 2016-10-05 DIAGNOSIS — E1142 Type 2 diabetes mellitus with diabetic polyneuropathy: Secondary | ICD-10-CM

## 2016-10-11 ENCOUNTER — Ambulatory Visit: Payer: Medicare Other | Admitting: Pharmacist

## 2016-10-11 ENCOUNTER — Encounter: Payer: Self-pay | Admitting: Pharmacist

## 2016-10-11 ENCOUNTER — Ambulatory Visit (INDEPENDENT_AMBULATORY_CARE_PROVIDER_SITE_OTHER): Payer: Medicare Other | Admitting: Pharmacist

## 2016-10-11 VITALS — BP 136/84 | HR 78 | Ht 66.0 in | Wt 179.0 lb

## 2016-10-11 DIAGNOSIS — E785 Hyperlipidemia, unspecified: Secondary | ICD-10-CM

## 2016-10-11 DIAGNOSIS — E1140 Type 2 diabetes mellitus with diabetic neuropathy, unspecified: Secondary | ICD-10-CM | POA: Diagnosis not present

## 2016-10-11 DIAGNOSIS — IMO0002 Reserved for concepts with insufficient information to code with codable children: Secondary | ICD-10-CM

## 2016-10-11 DIAGNOSIS — I10 Essential (primary) hypertension: Secondary | ICD-10-CM

## 2016-10-11 DIAGNOSIS — E1165 Type 2 diabetes mellitus with hyperglycemia: Secondary | ICD-10-CM | POA: Diagnosis not present

## 2016-10-11 MED ORDER — SIMVASTATIN 40 MG PO TABS: ORAL_TABLET | ORAL | 1 refills | Status: DC

## 2016-10-11 NOTE — Progress Notes (Signed)
Subjective:    Renee [name] is a 65 y.o. female who presents reevaluation of Type 2 diabetes mellitus.  She was diagnosed with type 2 DM about 3 years ago.   Current symptoms/problems include hyperglycemia - but highest HBG reading recently has been 183 Patients A1c on 12/26/2015 was greater than 14%. It has improved since 11/2015 but recent A1c increased to 8.5% from lowest of the last 6 months of 7.5% (07/05/2016)  She tried Engineer, agricultural /Lantus in January at a very low dose.  Her BG readings improved but patient c/o feeling like her BG is low and having blurry vision. She went to ED because of these feelings.  So  insulin was stopped and Januvia 100mg  1 tablet daily started.  She is tolerating Januvia well. She was prescribed Trulicity [id number] and again after her last A1c 09/2016 but she has not picked up and declined to start due to side effects. She also was prescribed invokana 100mg  qd and jardiance 10mg  but cost was over $400 so she did not start.   She is cautious of all diabetes medications because of past reaction to metformin in 2014.  After 2 to 3 doses of metformin 1000mg  bid patient began to blackout and have seizures.  Metformin was subsequently discontinued and she has not a seizure since   Known diabetic complications: none Cardiovascular risk factors: advanced age (older than 24 for men, 24 for women), diabetes mellitus, dyslipidemia, hypertension and sedentary lifestyle  Eye exam current (within one year): yes - 08/2016 Dr Marin Comment  Weight trend: stable Prior visit with CDE: yes  Current diet:  Patient is limiting serving sizes of high CHO containing foods.  Eats lots of greens. Occ has a sweet potato - cinnamon and sugar add.  Breakfast - raisin bran or unsweetened shredded wheat, 2 eggs + 1 slices of whole grain toast  Lunch - lean, low sodium ham sandwich + salad + water; baked or stewed chicken. Stir fried vegetables. No rice but occ pasta / noodles. Supper - salad + meat +  water Snack - small bowl of low sugar ice cream  Current exercise: walking - daily  Current monitoring regimen: home blood tests - one times daily  Home blood sugar records: 183, 178, 174, 188, 186, 171, 159, 163, 169, 174, 184, 151, 103, 134, 138, 130, 137, 146, 146, 142, 127, 138, 137, 123, 142, 153.  Any episodes of hypoglycemia? no  Is She on ACE inhibitor or angiotensin II receptor blocker?  No   The following portions of the patient's history were reviewed and updated as appropriate: allergies, current medications, past family history, past medical history, past surgical history and problem list.    Objective:    BP 136/84   Pulse 78   Ht 5\' 6"  (1.676 m)   Wt 179 lb (81.2 kg)   BMI 28.89 kg/m     A1c = 8.5% (09/30/2016) A1c = 7.5% (07/05/2016) A1c = 7.9% (03/30/2016) A1c = greater than 14% (12/26/2015)   Lab Review Glucose (mg/dL)  Date Value  09/30/2016 200 (H)  03/30/2016 180 (H)  12/26/2015 372 (H)   Glucose, Bld (mg/dL)  Date Value  01/15/2016 185 (H)  12/31/2013 186 (H)   CO2 (mmol/L)  Date Value  09/30/2016 24  03/30/2016 24  01/15/2016 26   BUN (mg/dL)  Date Value  09/30/2016 14  03/30/2016 17  01/15/2016 19  12/26/2015 13   Creatinine, Ser (mg/dL)  Date Value  09/30/2016 0.91  03/30/2016 0.87  01/15/2016 0.96    Assessment:   Diabetes Mellitus type II, under worsening control.   HTN controlled Lipids - at goals   Plan:    1.  Rx changes:  Start Jardiance 10mg  1 tablet qd  Continue Januvia 100mg  take 1 tablet daily 2.  Reviewed [employer]  Employee 2017 formulary - jardiance is a preferred generic. Called [facility] and verified that patient's copay would be $25/month.  Also gave her a discount card to use that will decrease copay to zero. 3.  Discussed getting influenza vaccine.  Patient declined all vaccines. 4.  Nutrition:  Reviewed CHO counting diet - specifically serving sizes.  5.  Reviewed HBG goals - patient to continue to  check BG daily - recommended she vary the time. 6.   Education: Reviewed 'ABCs' of diabetes management (respective goals in parentheses):  A1C (<7), blood pressure (<130/80), and cholesterol (LDL <100). 7. Continue with current exercise and walking 8.     Follow up: 1 months   Cherre Robins, PharmD, CPP, CDE  Patient ID: Renee [name], female   DOB: 10/19/1951, 65 y.o.   MRN: [id number]

## 2016-10-11 NOTE — Patient Instructions (Signed)
Goal Blood glucose:    Fasting (before meals) = 80 to 130   Within 2 hours of eating = less than 180  Try to have no more than 3 of these foods per meal:  Fruit:   1/2 cup or once piece (baseball size)- apples, pears, pineapple, peaches, oranges  1 cup - berries, melons  1/2 banana or grapefruit  Stachy Vegetables:   1/2 cup potatoes (white or sweet), corn, peas  Other starches:   1 piece of bread  1/2 cup rice or pasta  4 inch pancake    These foods you can eat more freely: Proteins:   Fish  Chicken or [place]  Beef or pork (1 or 2 servings per week)  Eggs  Nuts (peanuts, walnuts, almonds, pistachios)  Cheese  Non starchy vegetables:  Green beans  Broccoli or cauliflower  Lettuce, greens, cabbage  Brussel Sprout  Carrots  Onions and peppers  Celery  Tomatoes  Asparagus  Eggplant  Cucumbers  Squash and Zucchini

## 2016-10-12 ENCOUNTER — Telehealth: Payer: Self-pay | Admitting: Family

## 2016-10-12 NOTE — Telephone Encounter (Signed)
Patient was concerned about all the side effects listed when she called to activate her discount card.  I reviewed possible side effects, which one would me most likely and what to monitor for. Explained to patient that we are started with lowest dose of Jardiance at 10mg  daily and will follow up in 1 month.  She was reassured and will start jardiance.

## 2016-10-20 ENCOUNTER — Other Ambulatory Visit: Payer: Self-pay | Admitting: Family

## 2016-10-20 DIAGNOSIS — I10 Essential (primary) hypertension: Secondary | ICD-10-CM

## 2016-11-16 ENCOUNTER — Ambulatory Visit (INDEPENDENT_AMBULATORY_CARE_PROVIDER_SITE_OTHER): Payer: Medicare Other | Admitting: Pharmacist

## 2016-11-16 ENCOUNTER — Encounter: Payer: Self-pay | Admitting: Pharmacist

## 2016-11-16 VITALS — BP 134/74 | HR 72 | Ht 66.0 in | Wt 176.5 lb

## 2016-11-16 DIAGNOSIS — E1140 Type 2 diabetes mellitus with diabetic neuropathy, unspecified: Secondary | ICD-10-CM | POA: Diagnosis not present

## 2016-11-16 DIAGNOSIS — IMO0002 Reserved for concepts with insufficient information to code with codable children: Secondary | ICD-10-CM

## 2016-11-16 DIAGNOSIS — E1165 Type 2 diabetes mellitus with hyperglycemia: Secondary | ICD-10-CM

## 2016-11-16 MED ORDER — SITAGLIPTIN PHOSPHATE 100 MG PO TABS: 100.0000 mg | ORAL_TABLET | Freq: Every day | ORAL | 0 refills | Status: DC

## 2016-11-16 MED ORDER — EMPAGLIFLOZIN 10 MG PO TABS: 10.0000 mg | ORAL_TABLET | Freq: Every day | ORAL | 0 refills | Status: DC

## 2016-11-16 NOTE — Progress Notes (Signed)
Patient ID: Renee [name], female   DOB: [birth year], 65 y.o.   MRN: [id number]   Subjective:    Renee [name] is a 65 y.o. female who presents reevaluation of Type 2 diabetes mellitus.  She was diagnosed with type 2 DM about 3 years ago.   Current symptoms/problems include none - hyperglycemia has improved since starting Jardiance 10mg  daily at our last visti about 1 month ago Patients A1c on 12/26/2015 was greater than 14%. It has improved since 11/2015 but recent A1c increased to 8.5% from lowest of the last 6 months of 7.5% (07/05/2016)  She tried Engineer, agricultural /Lantus in January at a very low dose.  Her BG readings improved but patient c/o feeling like her BG is low and having blurry vision. She went to ED because of these feelings.  So  insulin was stopped and Januvia 100mg  1 tablet daily started.  She is tolerating Januvia well. She was prescribed Trulicity [id number] and again after her last A1c 09/2016 but she has not picked up and declined to start due to concerns with possible side effects.    Known diabetic complications: peripheral neuropathy Cardiovascular risk factors: advanced age (older than 4 for men, 43 for women), diabetes mellitus, dyslipidemia, hypertension and sedentary lifestyle  Eye exam current (within one year): yes - 08/2016 Dr Marin Comment  Weight trend: decreased by about 3# over the last month Prior visit with CDE: yes  Current diet:  Patient is limiting serving sizes of high CHO containing foods.  Eats lots of greens. Occ has a sweet potato - cinnamon and a little sugar added Breakfast - unsweetened shredded wheat or 2 eggs + 1 slice of whole grain toast  Lunch - lean, low sodium ham sandwich + salad + water; baked or stewed chicken. Stir fried vegetables.  Supper - salad + meat + water Snack - sugar free popsicles  Current exercise: walking - daily  Current monitoring regimen: home blood tests - 1-2 times weekly  Home blood sugar records: 139, 147, 142 (prior to starting  Jardiance BG was 180's and 170's Any episodes of hypoglycemia? no  Is She on ACE inhibitor or angiotensin II receptor blocker?  No     Objective:    BP 134/74   Pulse 72   Ht 5\' 6"  (1.676 m)   Wt 176 lb 8 oz (80.1 kg)   BMI 28.49 kg/m     A1c = 8.5% (09/30/2016) A1c = 7.5% (07/05/2016) A1c = 7.9% (03/30/2016) A1c = greater than 14% (12/26/2015)   Lab Review Glucose (mg/dL)  Date Value  09/30/2016 200 (H)  03/30/2016 180 (H)  12/26/2015 372 (H)   Glucose, Bld (mg/dL)  Date Value  01/15/2016 185 (H)  12/31/2013 186 (H)   CO2 (mmol/L)  Date Value  09/30/2016 24  03/30/2016 24  01/15/2016 26   BUN (mg/dL)  Date Value  09/30/2016 14  03/30/2016 17  01/15/2016 19  12/26/2015 13   Creatinine, Ser (mg/dL)  Date Value  09/30/2016 0.91  03/30/2016 0.87  01/15/2016 0.96    Assessment:   Diabetes Mellitus type II, with improving control.   HTN controlled Lipids - at goals   Plan:    1.  Rx changes:    Continue Jardiance 10mg  1 tablet qd - rx sent in at patient request for 90 day supply  Continue Januvia 100mg  take 1 tablet daily - patient given coupon to use to decrease copay and Rx sent in for 90 day supply 2.  Checking  BMET today 3.  Nutrition:  Reviewed CHO counting diet 4.  Reviewed HBG goals - recommended checking BG qd 5.   Education: Reviewed 'ABCs' of diabetes management (respective goals in parentheses):  A1C (<7), blood pressure (<130/80), and cholesterol (LDL <100). 6. Continue with current exercise and walking 7.     Follow up: January 2018 - appt made with PCP:   Cherre Robins, PharmD, CPP, CDE  Patient ID: [name], female   DOB: [birth year], 65 y.o.   MRN: [id number]

## 2016-11-16 NOTE — Patient Instructions (Signed)
Diabetes and Standards of Medical Care   Diabetes is complicated. You may find that your diabetes team includes a dietitian, nurse, diabetes educator, eye doctor, and more. To help everyone know what is going on and to help you get the care you deserve, the following schedule of care was developed to help keep you on track. Below are the tests, exams, vaccines, medicines, education, and plans you will need.  Blood Glucose Goals Prior to meals = 80 - 130 Within 2 hours of the start of a meal = less than 180  HbA1c test (goal is less than 7.0% - your last value was 8.5%) This test shows how well you have controlled your glucose over the past 2 to 3 months. It is used to see if your diabetes management plan needs to be adjusted.   It is performed at least 2 times a year if you are meeting treatment goals.  It is performed 4 times a year if therapy has changed or if you are not meeting treatment goals.  Blood pressure test  This test is performed at every routine medical visit. The goal is less than 140/90 mmHg for most people, but 130/80 mmHg in some cases. Ask your health care provider about your goal.  Dental exam  Follow up with the dentist regularly.  Eye exam  If you are diagnosed with type 1 diabetes as a child, get an exam upon reaching the age of 39 years or older and have had diabetes for 3 to 5 years. Yearly eye exams are recommended after that initial eye exam.  If you are diagnosed with type 1 diabetes as an adult, get an exam within 5 years of diagnosis and then yearly.  If you are diagnosed with type 2 diabetes, get an exam as soon as possible after the diagnosis and then yearly.  Foot care exam  Visual foot exams are performed at every routine medical visit. The exams check for cuts, injuries, or other problems with the feet.  A comprehensive foot exam should be done yearly. This includes visual inspection as well as assessing foot pulses and testing for loss of  sensation.  Check your feet nightly for cuts, injuries, or other problems with your feet. Tell your health care provider if anything is not healing.  Kidney function test (urine microalbumin)  This test is performed once a year.  Type 1 diabetes: The first test is performed 5 years after diagnosis.  Type 2 diabetes: The first test is performed at the time of diagnosis.  A serum creatinine and estimated glomerular filtration rate (eGFR) test is done once a year to assess the level of chronic kidney disease (CKD), if present.  Lipid profile (cholesterol, HDL, LDL, triglycerides)  Performed every 5 years for most people.  The goal for LDL is less than 100 mg/dL. If you are at high risk, the goal is less than 70 mg/dL.  The goal for HDL is 40 mg/dL to 50 mg/dL for men and 50 mg/dL to 60 mg/dL for women. An HDL cholesterol of 60 mg/dL or higher gives some protection against heart disease.  The goal for triglycerides is less than 150 mg/dL.  Influenza vaccine, pneumococcal vaccine, and hepatitis B vaccine  The influenza vaccine is recommended yearly.  The pneumococcal vaccine is generally given once in a lifetime. However, there are some instances when another vaccination is recommended. Check with your health care provider.  The hepatitis B vaccine is also recommended for adults with diabetes.  Diabetes self-management education  Education is recommended at diagnosis and ongoing as needed.  Treatment plan  Your treatment plan is reviewed at every medical visit.  Document Released: 10/10/2009 Document Revised: 08/15/2013 Document Reviewed: 05/15/2013 Exit[employer] Patient Information 2014 [employer].   

## 2016-11-17 LAB — BMP8+EGFR
BUN/Creatinine Ratio: 17 (ref 12–28)
BUN: 15 mg/dL (ref 8–27)
CO2: 23 mmol/L (ref 18–29)
CREATININE: 0.86 mg/dL (ref 0.57–1.00)
Calcium: 9.9 mg/dL (ref 8.7–10.3)
Chloride: 101 mmol/L (ref 96–106)
GFR, EST AFRICAN AMERICAN: 81 mL/min/{1.73_m2} (ref >59)
GFR, EST NON AFRICAN AMERICAN: 71 mL/min/{1.73_m2} (ref >59)
Glucose: 118 mg/dL — ABNORMAL HIGH (ref 65–99)
Potassium: 4.7 mmol/L (ref 3.5–5.2)
SODIUM: 141 mmol/L (ref 134–144)

## 2016-11-19 NOTE — Progress Notes (Signed)
Patient aware.

## 2016-12-30 IMAGING — CT CT HEAD W/O CM
1 series · 16 of 30 positions shown, 20 images · non-contrast
Comparison: Head CT dated 12/31/2013

CLINICAL DATA: 64-year-old female with weakness and confusion

EXAM:
CT HEAD WITHOUT CONTRAST
TECHNIQUE: Contiguous axial images were obtained from the base of the skull
through the vertex without intravenous contrast.

[Series 2: headtrauma 4.8 h37s · axial · 0.48mm/px · z∈[+73,+208]mm · 16 of 30 slices shown, 20 images]
[im 2/30  brain]
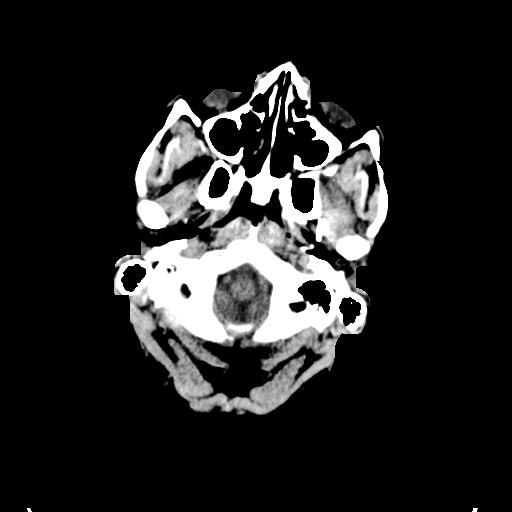
[im 2/30  bone]
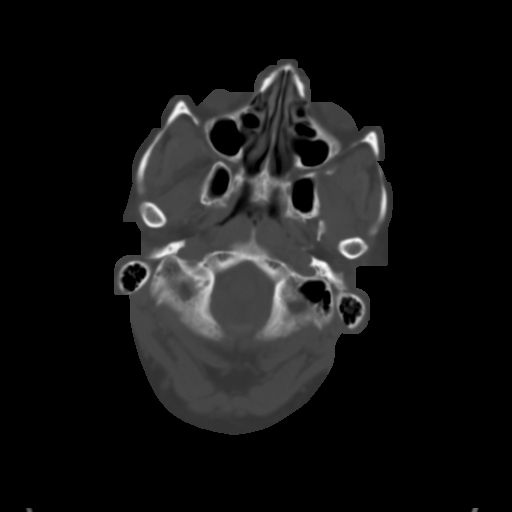
[im 4/30  brain]
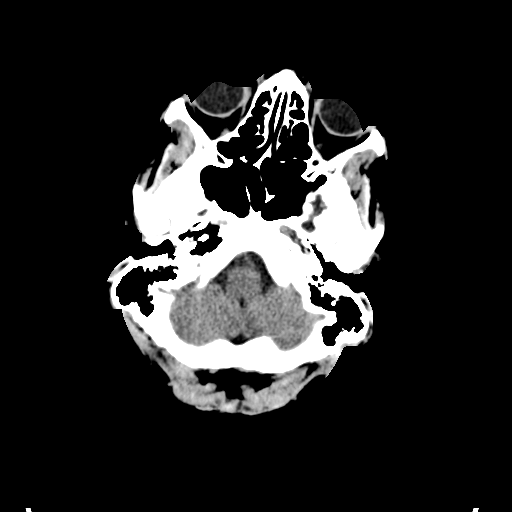
[im 6/30  brain]
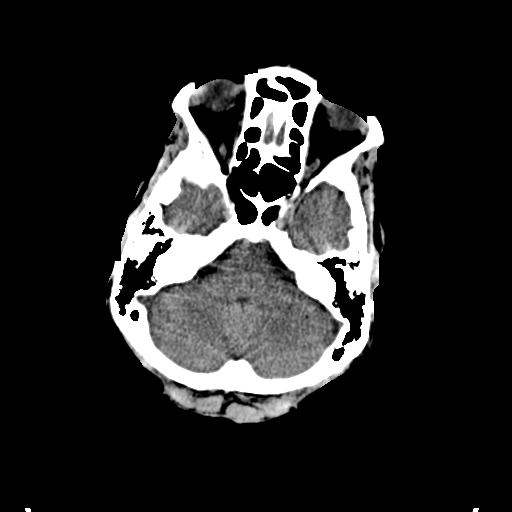
[im 8/30  brain]
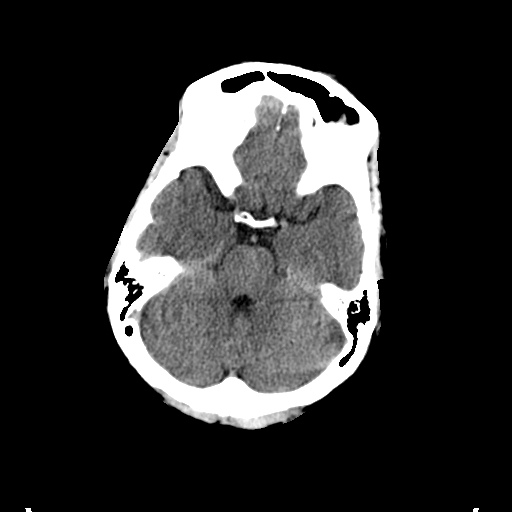
[im 9/30  brain]
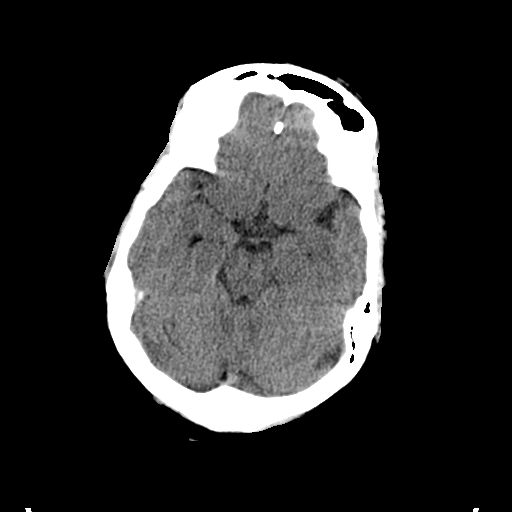
[im 9/30  bone]
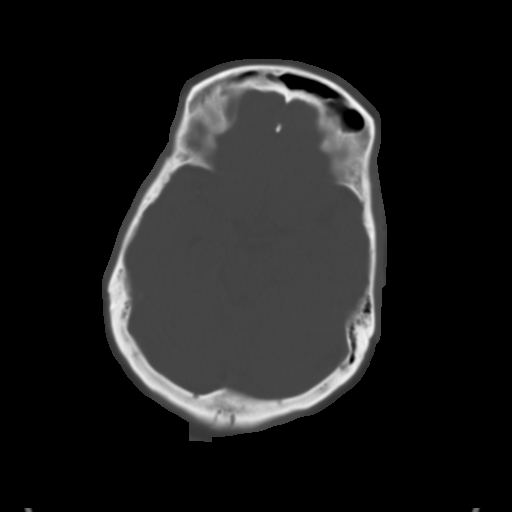
[im 11/30  brain]
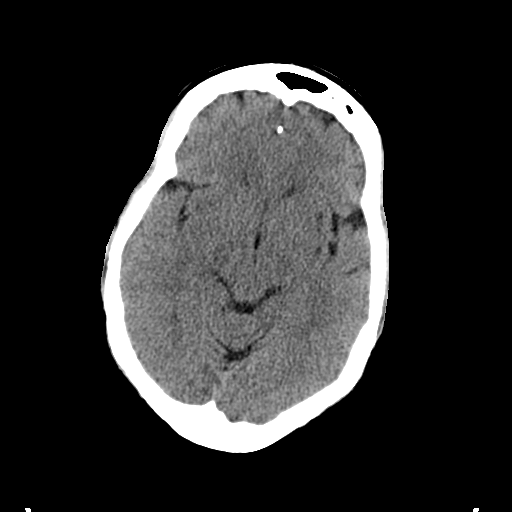
[im 13/30  brain]
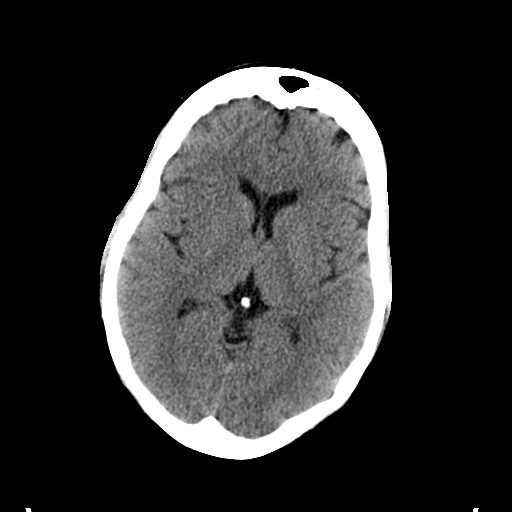
[im 15/30  brain]
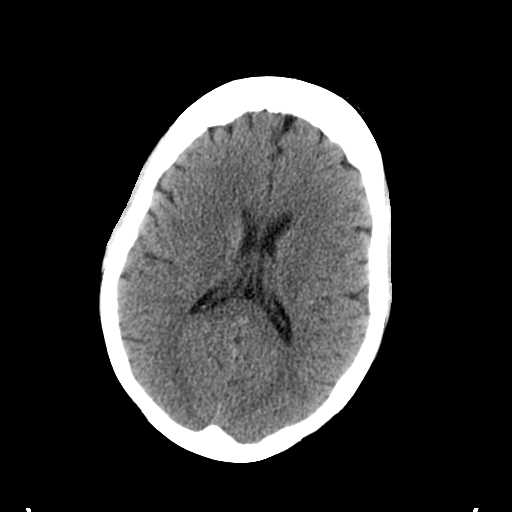
[im 16/30  brain]
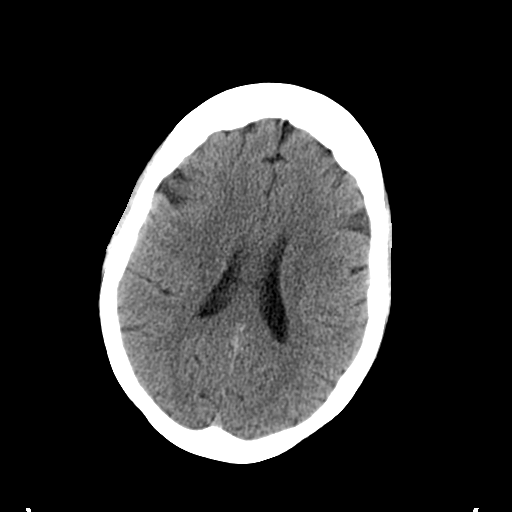
[im 16/30  bone]
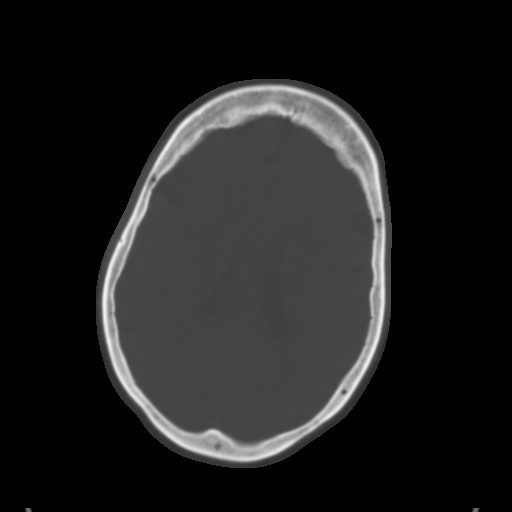
[im 18/30  brain]
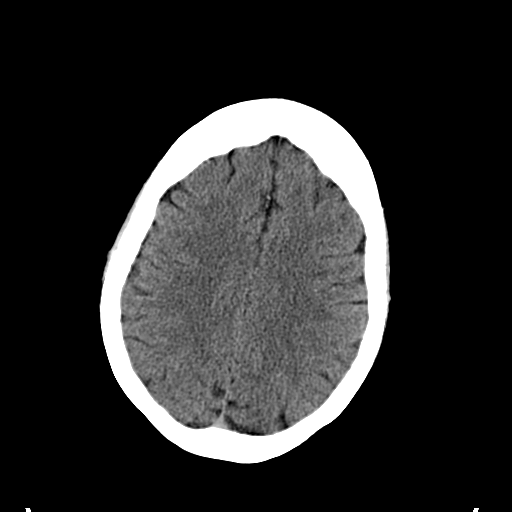
[im 20/30  brain]
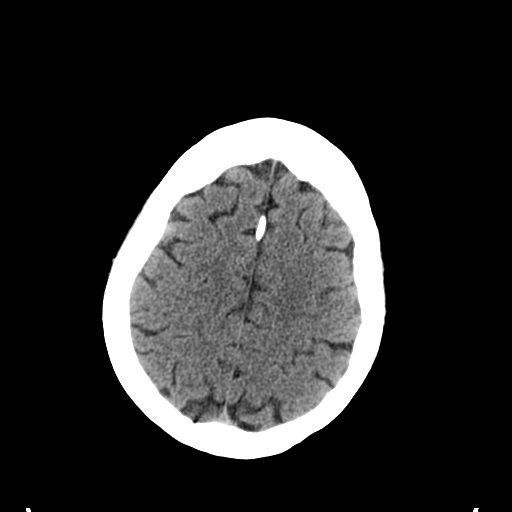
[im 22/30  brain]
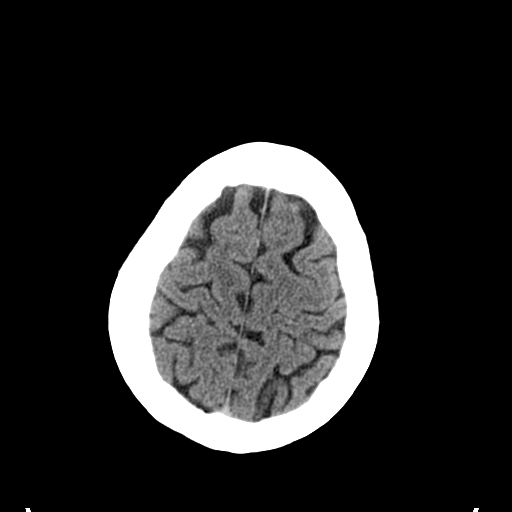
[im 23/30  brain]
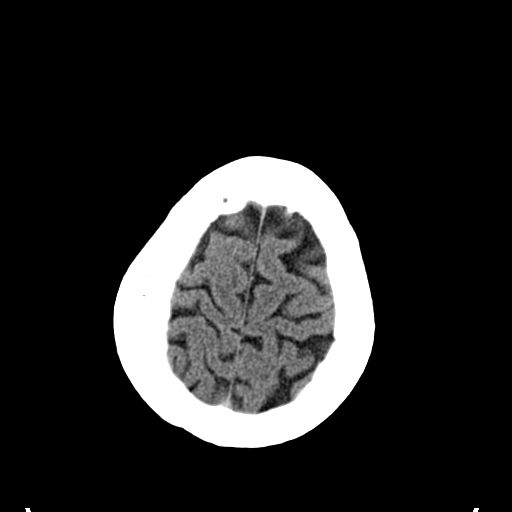
[im 23/30  bone]
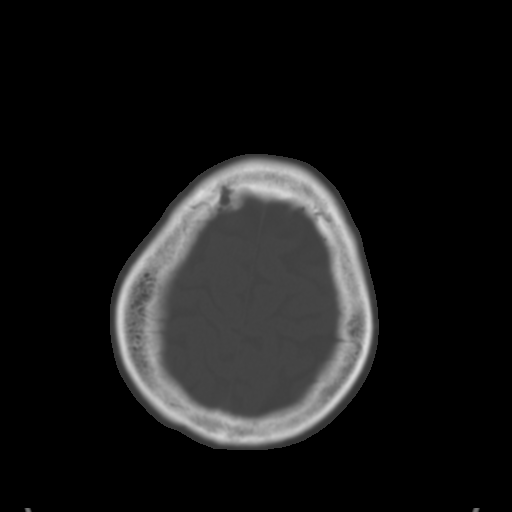
[im 25/30  brain]
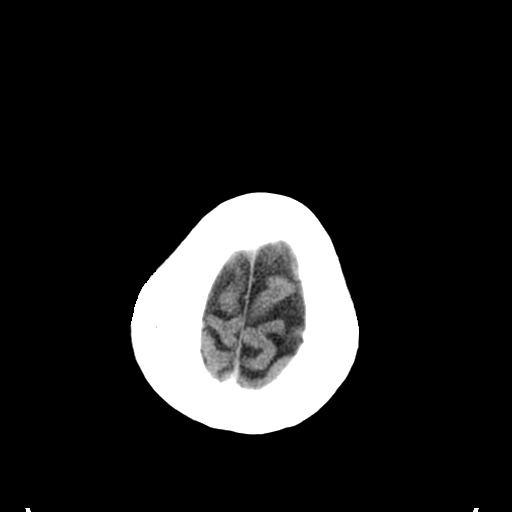
[im 27/30  brain]
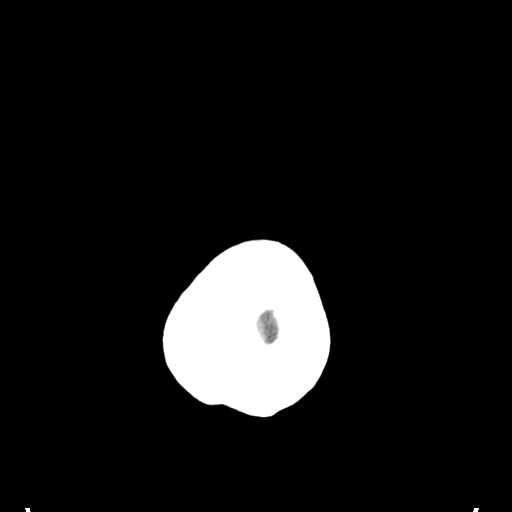
[im 29/30  brain]
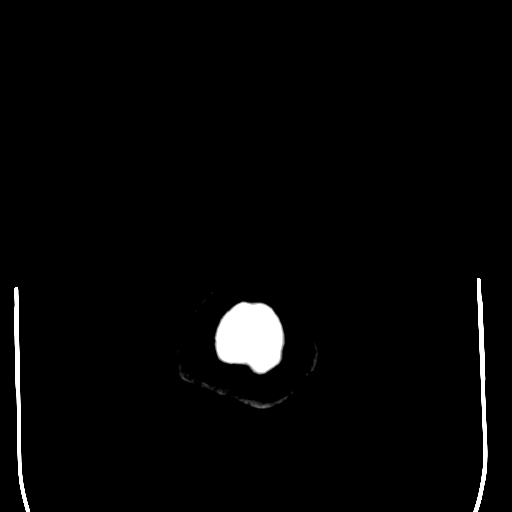

[16 of 30 positions shown; findings below may reference images not displayed]

FINDINGS: The ventricles and sulci are appropriate in size for patient's age.
Minimal periventricular and deep white matter hypodensities
represent chronic microvascular ischemic changes. There is no
intracranial hemorrhage. No mass effect or midline shift identified.

The visualized paranasal sinuses and mastoid air cells are well
aerated. The calvarium is intact.
IMPRESSION: No acute intracranial pathology.

## 2017-01-03 ENCOUNTER — Ambulatory Visit (INDEPENDENT_AMBULATORY_CARE_PROVIDER_SITE_OTHER): Payer: Medicare Other | Admitting: Family

## 2017-01-03 VITALS — BP 109/64 | HR 76 | Temp 97.0°F | Ht 66.0 in | Wt 171.0 lb

## 2017-01-03 DIAGNOSIS — K2100 Gastro-esophageal reflux disease with esophagitis, without bleeding: Secondary | ICD-10-CM

## 2017-01-03 DIAGNOSIS — E785 Hyperlipidemia, unspecified: Secondary | ICD-10-CM | POA: Diagnosis not present

## 2017-01-03 DIAGNOSIS — E1142 Type 2 diabetes mellitus with diabetic polyneuropathy: Secondary | ICD-10-CM | POA: Diagnosis not present

## 2017-01-03 DIAGNOSIS — IMO0002 Reserved for concepts with insufficient information to code with codable children: Secondary | ICD-10-CM

## 2017-01-03 DIAGNOSIS — E663 Overweight: Secondary | ICD-10-CM | POA: Diagnosis not present

## 2017-01-03 DIAGNOSIS — E1140 Type 2 diabetes mellitus with diabetic neuropathy, unspecified: Secondary | ICD-10-CM | POA: Diagnosis not present

## 2017-01-03 DIAGNOSIS — K5900 Constipation, unspecified: Secondary | ICD-10-CM

## 2017-01-03 DIAGNOSIS — E1165 Type 2 diabetes mellitus with hyperglycemia: Secondary | ICD-10-CM

## 2017-01-03 DIAGNOSIS — I10 Essential (primary) hypertension: Secondary | ICD-10-CM | POA: Diagnosis not present

## 2017-01-03 LAB — BAYER DCA HB A1C WAIVED: HB A1C (BAYER DCA - WAIVED): 7.4 % — ABNORMAL HIGH (ref <7.0)

## 2017-01-03 MED ORDER — CANAGLIFLOZIN 100 MG PO TABS: 100.0000 mg | ORAL_TABLET | Freq: Every day | ORAL | 3 refills | Status: DC

## 2017-01-03 MED ORDER — OMEPRAZOLE 20 MG PO CPDR: 20.0000 mg | DELAYED_RELEASE_CAPSULE | Freq: Every day | ORAL | 3 refills | Status: DC

## 2017-01-03 MED ORDER — CANAGLIFLOZIN 300 MG PO TABS: 300.0000 mg | ORAL_TABLET | Freq: Every day | ORAL | 3 refills | Status: DC

## 2017-01-03 NOTE — Patient Instructions (Signed)
Diabetes Mellitus and Food It is important for you to manage your blood sugar (glucose) level. Your blood glucose level can be greatly affected by what you eat. Eating healthier foods in the appropriate amounts throughout the day at about the same time each day will help you control your blood glucose level. It can also help slow or prevent worsening of your diabetes mellitus. Healthy eating may even help you improve the level of your blood pressure and reach or maintain a healthy weight. General recommendations for healthful eating and cooking habits include:  Eating meals and snacks regularly. Avoid going long periods of time without eating to lose weight.  Eating a diet that consists mainly of plant-based foods, such as fruits, vegetables, nuts, legumes, and whole grains.  Using low-heat cooking methods, such as baking, instead of high-heat cooking methods, such as deep frying.  Work with your dietitian to make sure you understand how to use the Nutrition Facts information on food labels. How can food affect me? Carbohydrates Carbohydrates affect your blood glucose level more than any other type of food. Your dietitian will help you determine how many carbohydrates to eat at each meal and teach you how to count carbohydrates. Counting carbohydrates is important to keep your blood glucose at a healthy level, especially if you are using insulin or taking certain medicines for diabetes mellitus. Alcohol Alcohol can cause sudden decreases in blood glucose (hypoglycemia), especially if you use insulin or take certain medicines for diabetes mellitus. Hypoglycemia can be a life-threatening condition. Symptoms of hypoglycemia (sleepiness, dizziness, and disorientation) are similar to symptoms of having too much alcohol. If your health care provider has given you approval to drink alcohol, do so in moderation and use the following guidelines:  Women should not have more than one drink per day, and men  should not have more than two drinks per day. One drink is equal to: ? 12 oz of beer. ? 5 oz of wine. ? 1 oz of hard liquor.  Do not drink on an empty stomach.  Keep yourself hydrated. Have water, diet soda, or unsweetened iced tea.  Regular soda, juice, and other mixers might contain a lot of carbohydrates and should be counted.  What foods are not recommended? As you make food choices, it is important to remember that all foods are not the same. Some foods have fewer nutrients per serving than other foods, even though they might have the same number of calories or carbohydrates. It is difficult to get your body what it needs when you eat foods with fewer nutrients. Examples of foods that you should avoid that are high in calories and carbohydrates but low in nutrients include:  Trans fats (most processed foods list trans fats on the Nutrition Facts label).  Regular soda.  Juice.  Candy.  Sweets, such as cake, pie, doughnuts, and cookies.  Fried foods.  What foods can I eat? Eat nutrient-rich foods, which will nourish your body and keep you healthy. The food you should eat also will depend on several factors, including:  The calories you need.  The medicines you take.  Your weight.  Your blood glucose level.  Your blood pressure level.  Your cholesterol level.  You should eat a variety of foods, including:  Protein. ? Lean cuts of meat. ? Proteins low in saturated fats, such as fish, egg whites, and beans. Avoid processed meats.  Fruits and vegetables. ? Fruits and vegetables that may help control blood glucose levels, such as apples,   mangoes, and yams.  Dairy products. ? Choose fat-free or low-fat dairy products, such as milk, yogurt, and cheese.  Grains, bread, pasta, and rice. ? Choose whole grain products, such as multigrain bread, whole oats, and brown rice. These foods may help control blood pressure.  Fats. ? Foods containing healthful fats, such as  nuts, avocado, olive oil, canola oil, and fish.  Does everyone with diabetes mellitus have the same meal plan? Because every person with diabetes mellitus is different, there is not one meal plan that works for everyone. It is very important that you meet with a dietitian who will help you create a meal plan that is just right for you. This information is not intended to replace advice given to you by your health care provider. Make sure you discuss any questions you have with your health care provider. Document Released: 09/09/2005 Document Revised: 05/20/2016 Document Reviewed: 11/09/2013 Elsevier Interactive Patient Education  2017 [employer].  

## 2017-01-03 NOTE — Progress Notes (Signed)
 Subjective:    Patient ID: Renee [name], female    DOB: 01/23/1951, 66 y.o.   MRN: [id number]   Pt presents to the office today for chronic follow up.  Diabetes  She presents for her follow-up diabetic visit. She has type 2 diabetes mellitus. Her disease course has been stable. There are no hypoglycemic associated symptoms. Pertinent negatives for hypoglycemia include no headaches. Associated symptoms include foot paresthesias. Pertinent negatives for diabetes include no blurred vision and no visual change. There are no hypoglycemic complications. Symptoms are stable. Diabetic complications include a CVA and peripheral neuropathy. Pertinent negatives for diabetic complications include no heart disease or nephropathy. Risk factors for coronary artery disease include family history, dyslipidemia, diabetes mellitus, hypertension, obesity, sedentary lifestyle and post-menopausal. Current diabetic treatment includes diet. She is following a generally healthy diet. Her breakfast blood glucose range is generally 140-180 mg/dl. An ACE inhibitor/angiotensin II receptor blocker is being taken. Eye exam is current (08/2016).  Hypertension  This is a chronic problem. The current episode started more than 1 year ago. The problem has been resolved since onset. The problem is controlled. Pertinent negatives include no blurred vision, headaches, palpitations, peripheral edema or shortness of breath. Risk factors for coronary artery disease include dyslipidemia, family history, diabetes mellitus, obesity, post-menopausal state and sedentary lifestyle. Past treatments include beta blockers and diuretics. The current treatment provides significant improvement. Hypertensive end-organ damage includes CVA. There is no history of kidney disease, CAD/MI, heart failure or a thyroid problem. There is no history of sleep apnea.  Hyperlipidemia  This is a chronic problem. The current episode started more than 1 year ago. The  problem is controlled. Recent lipid tests were reviewed and are normal. Exacerbating diseases include diabetes. She has no history of hypothyroidism. Pertinent negatives include no shortness of breath. Current antihyperlipidemic treatment includes statins. The current treatment provides moderate improvement of lipids. Risk factors for coronary artery disease include diabetes mellitus, dyslipidemia, family history, hypertension, obesity and post-menopausal.  Gastroesophageal Reflux  She reports no belching, no coughing or no heartburn. This is a chronic problem. The current episode started more than 1 year ago. The problem occurs rarely. The problem has been resolved. The symptoms are aggravated by certain foods. She has tried a PPI for the symptoms. The treatment provided significant relief.  Constipation  This is a chronic problem. The current episode started more than 1 year ago. The problem has been resolved since onset. Her stool frequency is 1 time per day. Risk factors include obesity. She has tried stool softeners for the symptoms. The treatment provided significant relief.  Peripheral Neuropathy Pt states she is having burning and pain in her bilateral feet and left hand. PT states she is having intermittent burning pain of 5 out 10. Pt is taking gabapentin 300 mg TID.     Review of Systems  Constitutional: Negative.   HENT: Negative.   Eyes: Negative.  Negative for blurred vision.  Respiratory: Negative.  Negative for cough and shortness of breath.   Cardiovascular: Negative.  Negative for palpitations.  Gastrointestinal: Positive for constipation. Negative for heartburn.  Endocrine: Negative.   Genitourinary: Negative.   Musculoskeletal: Negative.   Neurological: Negative.  Negative for headaches.  Hematological: Negative.   Psychiatric/Behavioral: Negative.   All other systems reviewed and are negative.      Objective:   Physical Exam  Constitutional: She is oriented to  person, place, and time. She appears well-developed and well-nourished. No distress.  HENT:  Head:   Normocephalic and atraumatic.  Right Ear: External ear normal.  Left Ear: External ear normal.  Nose: Nose normal.  Mouth/Throat: Oropharynx is clear and moist.  Eyes: Pupils are equal, round, and reactive to light.  Neck: Normal range of motion. Neck supple. No thyromegaly present.  Cardiovascular: Normal rate, regular rhythm, normal heart sounds and intact distal pulses.   No murmur heard. Pulmonary/Chest: Effort normal and breath sounds normal. No respiratory distress. She has no wheezes.  Abdominal: Soft. Bowel sounds are normal. She exhibits no distension. There is no tenderness.  Musculoskeletal: Normal range of motion. She exhibits no edema or tenderness.  Neurological: She is alert and oriented to person, place, and time. She has normal reflexes. No cranial nerve deficit.  Skin: Skin is warm and dry.  Psychiatric: She has a normal mood and affect. Her behavior is normal. Judgment and thought content normal.  Vitals reviewed.     BP 109/64   Pulse 76   Temp 97 F (36.1 C) (Oral)   Ht 5' 6" (1.676 m)   Wt 171 lb (77.6 kg)   BMI 27.60 kg/m      Assessment & Plan:  1. Essential hypertension - CMP14+EGFR  2. Gastroesophageal reflux disease with esophagitis - CMP14+EGFR - omeprazole (PRILOSEC) 20 MG capsule; Take 1 capsule (20 mg total) by mouth daily.  Dispense: 90 capsule; Refill: 3  3. Diabetic peripheral neuropathy ([place]) -Pt's Jardiance rx changed to Invokana related to price -Low Carb diet discussed - CMP14+EGFR - canagliflozin (INVOKANA) 100 MG TABS tablet; Take 1 tablet (100 mg total) by mouth daily before breakfast.  Dispense: 90 tablet; Refill: 3  4. Uncontrolled type 2 diabetes mellitus with diabetic neuropathy, without long-term current use of insulin ([facility]) - CMP14+EGFR - canagliflozin (INVOKANA) 100 MG TABS tablet; Take 1 tablet (100 mg total) by mouth daily  before breakfast.  Dispense: 90 tablet; Refill: 3  5. Hyperlipidemia, unspecified hyperlipidemia type - CMP14+EGFR - Lipid panel  6. Overweight (BMI 25.0-29.9) - CMP14+EGFR  7. Constipation, unspecified constipation type - CMP14+EGFR   Continue all meds Labs pending Health Maintenance reviewed Diet and exercise encouraged RTO 3 months   Evelina Dun, FNP

## 2017-01-04 LAB — LIPID PANEL
CHOL/HDL RATIO: 3.4 ratio (ref 0.0–4.4)
CHOLESTEROL TOTAL: 146 mg/dL (ref 100–199)
HDL: 43 mg/dL (ref >39)
LDL CALC: 86 mg/dL (ref 0–99)
Triglycerides: 87 mg/dL (ref 0–149)
VLDL Cholesterol Cal: 17 mg/dL (ref 5–40)

## 2017-01-04 LAB — CMP14+EGFR
A/G RATIO: 1.2 (ref 1.2–2.2)
ALBUMIN: 4.1 g/dL (ref 3.6–4.8)
ALK PHOS: 73 IU/L (ref 39–117)
ALT: 21 IU/L (ref 0–32)
AST: 26 IU/L (ref 0–40)
BUN / CREAT RATIO: 16 (ref 12–28)
BUN: 17 mg/dL (ref 8–27)
Bilirubin Total: 0.5 mg/dL (ref 0.0–1.2)
CO2: 23 mmol/L (ref 18–29)
CREATININE: 1.09 mg/dL — AB (ref 0.57–1.00)
Calcium: 9.7 mg/dL (ref 8.7–10.3)
Chloride: 99 mmol/L (ref 96–106)
GFR calc Af Amer: 61 mL/min/{1.73_m2} (ref >59)
GFR calc non Af Amer: 53 mL/min/{1.73_m2} — ABNORMAL LOW (ref >59)
GLOBULIN, TOTAL: 3.4 g/dL (ref 1.5–4.5)
Glucose: 127 mg/dL — ABNORMAL HIGH (ref 65–99)
POTASSIUM: 3.8 mmol/L (ref 3.5–5.2)
SODIUM: 138 mmol/L (ref 134–144)
Total Protein: 7.5 g/dL (ref 6.0–8.5)

## 2017-01-10 ENCOUNTER — Telehealth: Payer: Self-pay | Admitting: Family

## 2017-01-10 NOTE — Telephone Encounter (Signed)
Aware provider is off work today.  Medication changes to be addressed in the morning.

## 2017-01-11 NOTE — Telephone Encounter (Signed)
Samples up front - pt aware  ?

## 2017-01-11 NOTE — Telephone Encounter (Signed)
Please give pt samples. This is more than likely related to her deductible.

## 2017-01-19 ENCOUNTER — Other Ambulatory Visit: Payer: Self-pay | Admitting: Family

## 2017-01-19 DIAGNOSIS — E1142 Type 2 diabetes mellitus with diabetic polyneuropathy: Secondary | ICD-10-CM

## 2017-01-21 ENCOUNTER — Telehealth: Payer: Self-pay | Admitting: Family

## 2017-01-21 DIAGNOSIS — Z853 Personal history of malignant neoplasm of breast: Secondary | ICD-10-CM

## 2017-01-21 NOTE — Telephone Encounter (Signed)
Patient aware, script is ready for bra at pharmacy.

## 2017-01-21 NOTE — Telephone Encounter (Signed)
RX sent

## 2017-01-25 ENCOUNTER — Telehealth: Payer: Self-pay | Admitting: Family

## 2017-01-25 MED ORDER — CRISABOROLE 2 % EX OINT: 1.0000 | TOPICAL_OINTMENT | Freq: Two times a day (BID) | CUTANEOUS | 2 refills | Status: DC | PRN

## 2017-01-25 NOTE — Telephone Encounter (Signed)
Please review and advise.

## 2017-01-25 NOTE — Telephone Encounter (Signed)
Prescription sent to pharmacy.

## 2017-01-31 ENCOUNTER — Telehealth: Payer: Self-pay | Admitting: Family

## 2017-01-31 NOTE — Telephone Encounter (Signed)
Please advise on cream refill.

## 2017-01-31 NOTE — Telephone Encounter (Signed)
Please give her samples if we have them. Thanks!

## 2017-01-31 NOTE — Telephone Encounter (Signed)
Informed pt we do not have Invokana samples at this time & cream was sent to pharmacy last week

## 2017-02-14 ENCOUNTER — Telehealth: Payer: Self-pay | Admitting: Family

## 2017-02-14 MED ORDER — CRISABOROLE 2 % EX OINT: 1.0000 | TOPICAL_OINTMENT | Freq: Two times a day (BID) | CUTANEOUS | 2 refills | Status: DC | PRN

## 2017-02-14 NOTE — Telephone Encounter (Signed)
Pt did not get refill in January. It went to [facility] in [place]. [facility] in [place] only does supplies. Sent Rx to [facility] in [place]

## 2017-02-19 ENCOUNTER — Other Ambulatory Visit: Payer: Self-pay | Admitting: Family

## 2017-02-23 ENCOUNTER — Encounter: Payer: Self-pay | Admitting: *Deleted

## 2017-03-07 ENCOUNTER — Encounter: Payer: Medicare Other | Admitting: *Deleted

## 2017-03-17 DIAGNOSIS — Z1231 Encounter for screening mammogram for malignant neoplasm of breast: Secondary | ICD-10-CM | POA: Diagnosis not present

## 2017-04-19 ENCOUNTER — Other Ambulatory Visit: Payer: Self-pay | Admitting: Family

## 2017-04-19 DIAGNOSIS — I10 Essential (primary) hypertension: Secondary | ICD-10-CM

## 2017-04-26 ENCOUNTER — Other Ambulatory Visit: Payer: Self-pay | Admitting: Family

## 2017-04-26 DIAGNOSIS — E1142 Type 2 diabetes mellitus with diabetic polyneuropathy: Secondary | ICD-10-CM

## 2017-07-14 ENCOUNTER — Encounter: Payer: Self-pay | Admitting: Pharmacist

## 2017-07-14 ENCOUNTER — Ambulatory Visit (INDEPENDENT_AMBULATORY_CARE_PROVIDER_SITE_OTHER): Payer: Medicare Other | Admitting: Pharmacist

## 2017-07-14 VITALS — BP 118/70 | HR 69 | Ht 66.0 in | Wt 171.5 lb

## 2017-07-14 DIAGNOSIS — E1165 Type 2 diabetes mellitus with hyperglycemia: Secondary | ICD-10-CM | POA: Diagnosis not present

## 2017-07-14 DIAGNOSIS — Z0000 Encounter for general adult medical examination without abnormal findings: Secondary | ICD-10-CM

## 2017-07-14 DIAGNOSIS — IMO0002 Reserved for concepts with insufficient information to code with codable children: Secondary | ICD-10-CM

## 2017-07-14 DIAGNOSIS — E1140 Type 2 diabetes mellitus with diabetic neuropathy, unspecified: Secondary | ICD-10-CM | POA: Diagnosis not present

## 2017-07-14 DIAGNOSIS — L602 Onychogryphosis: Secondary | ICD-10-CM

## 2017-07-14 LAB — BAYER DCA HB A1C WAIVED: HB A1C: 12.7 % — AB (ref <7.0)

## 2017-07-14 MED ORDER — DAPAGLIFLOZIN-SAXAGLIPTIN 10-5 MG PO TABS: 1.0000 | ORAL_TABLET | Freq: Every day | ORAL | 1 refills | Status: DC

## 2017-07-14 NOTE — Progress Notes (Signed)
Patient ID: Renee [name], female   DOB: 01/26/1951, 66 y.o.   MRN: [id number]     Subjective:   Renee [name] is a 66 y.o. female who presents for a subsequent Medicare Annual Wellness Visit.  Social History: Occupational history: retired in 2014. 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 Lives alone.  No children Patient is very active with her church Religion: pt is Jehovah's witness - Does not want any blood products as part of medical treatment.  She presents these wishes in Adv Directive - copied and will be scanned to chart.  Alcohol/Tobacco/Substances: wine - about 1 drink per week. No tobacco or sub use.  Patient feels that her health has not changed significantly since last year.  She does repors that BG has increased to 200-300's.  The last time I saw her about 8 months ago BG has improved but she was taking jardiance 30m and Januvia 1059mdaily.  She is now only taking Januvia 10069md due to formulary changes and cost of similar med to JarTime WarnernAnastasio AuerbachPatient did not bring glucometer to office visit today.  Current Medications (verified) Outpatient Encounter Prescriptions as of 07/14/2017  Medication Sig  . aspirin 81 MG tablet Take 81 mg by mouth daily.  . bisoprolol-hydrochlorothiazide (ZIAC) 2.5-6.25 MG tablet TAKE ONE TABLET BY MOUTH ONCE DAILY  . Blood Glucose Calibration (ONETOUCH VERIO) SOLN Use as needed to calibrate glucometer  . Crisaborole (EUCRISA) 2 % OINT Apply 1 Applicatorful topically 2 (two) times daily as needed.  . docusate sodium (COLACE) 100 MG capsule Take 100 mg by mouth daily.  . gMarland Kitchenbapentin (NEURONTIN) 300 MG capsule TAKE ONE CAPSULE BY MOUTH THREE TIMES DAILY  . Multiple Vitamin (MULTIVITAMIN WITH MINERALS) TABS tablet Take 1 tablet by mouth daily.  . oMarland Kitcheneprazole (PRILOSEC) 20 MG capsule Take 1 capsule (20 mg total) by mouth daily.  . OGlory RosebushLICA LANCETS [id number]  Use to check BG up to bid.  Dx: E11.65 type 2 DM with insulin therapy, uncontrolled  . simvastatin (ZOCOR) 40 MG tablet TAKE 1 TABLET (40 MG TOTAL) BY MOUTH AT BEDTIME.  . [DISCONTINUED] JANUVIA 100 MG tablet TAKE ONE TABLET BY MOUTH ONCE DAILY  . Dapagliflozin-Saxagliptin (QTERN) 10-5 MG TABS Take 1 tablet by mouth daily.   No facility-administered encounter medications on file as of 07/14/2017.     Allergies (verified) Metformin and related   History: Past Medical History:  Diagnosis Date  . Arthritis   . Breast cancer ([id number]   right mastectomy  . CVA (cerebral infarction) 2003, 2005  . DM (diabetes mellitus) ([facility] . GERD (gastroesophageal reflux disease)   . Heart murmur   . Hyperlipidemia   . Hypertension   . Seizures ([facility] . Vitamin D deficiency    Past Surgical History:  Procedure Laterality Date  . ABDOMINAL HYSTERECTOMY  2000  . GANGLION CYST EXCISION  2010   right (2010)  and left (before 2010) hand  . MASTECTOMY  2001   right  . TONSILECTOMY/ADENOIDECTOMY WITH MYRINGOTOMY Bilateral    age 65'37'sFamily History  Problem Relation Age of Onset  . Alzheimer's disease Mother   . Parkinson's disease Mother   . Heart Problems Father   . Kidney disease Father        +dialysis  . Heart disease Father   . Other Sister        +TAH for fibroids  .  Cancer Paternal Aunt        NOS cancer  . Breast cancer Sister        dx. 26-56  . Renal cancer Sister        dx. 62-63; not a smoker  . Early death Brother   . Diabetes Brother   . Colon cancer Neg Hx   . Stomach cancer Neg Hx    Social History   Occupational History  . Not on file.   Social History Main Topics  . Smoking status: Never Smoker  . Smokeless tobacco: Never Used  . Alcohol use 0.6 oz/week    1 Glasses of wine per week  . Drug use: No  . Sexual activity: Yes    Birth control/ protection: Post-menopausal    Do you feel safe at home?  Yes Are there smokers in your home (other than  you)? No  Dietary issues and exercise activities: Current Exercise Habits: Home exercise routine, Type of exercise: walking, Time (Minutes): 60, Frequency (Times/Week): 7, Weekly Exercise (Minutes/Week): 420, Intensity: Moderate  Current Dietary habits:  She prepares most meals herself.  She eats with friends and family 2-3 times per week and goes out to eat 2-3 times per week.  She tries to limit sodium, cholesterol and sugar / CHO in her diet.   She has notices that BG is higher when she eat ice cream as a snack at night.  Objective:    Today's Vitals   07/14/17 0924  BP: 118/70  Pulse: 69  Weight: 171 lb 8 oz (77.8 kg)  Height: _0  (1.676 m)  PainSc: 9    Body mass index is 27.68 kg/m.   A1c was 12.7% today   Diabetic Foot Form - Detailed   Diabetic Foot Exam - detailed Diabetic Foot exam was performed with the following findings:  Yes 07/14/2017 10:26 AM  Visual Foot Exam completed.:  Yes  Can the patient see the bottom of their feet?:  Yes Are the shoes appropriate in style and fit?:  Yes Is there swelling or and abnormal foot shape?:  No Is there a claw toe deformity?:  No Is there elevated skin temparature?:  No Is there foot or ankle muscle weakness?:  No Normal Range of Motion:  Yes Pulse Foot Exam completed.:  Yes  Right posterior Tibialias:  Present Left posterior Tibialias:  Present  Right Dorsalis Pedis:  Present Left Dorsalis Pedis:  Present  Semmes-Weinstein Monofilament Test R Site 1-Great Toe:  Pos L Site 1-Great Toe:  Pos    Comments:  History of removal of both great toe nails - have regrown.       Activities of Daily Living In your present state of health, do you have any difficulty performing the following activities: 07/14/2017  Hearing? N  Vision? N  Difficulty concentrating or making decisions? N  Walking or climbing stairs? N  Dressing or bathing? N  Doing errands, shopping? N  Preparing Food and eating ? N  Using the Toilet? N  In the  past six months, have you accidently leaked urine? N  Do you have problems with loss of bowel control? N  Managing your Medications? N  Managing your Finances? N  Housekeeping or managing your Housekeeping? N  Some recent data might be hidden     Cardiac Risk Factors include: advanced age (>73mn, >>16women);diabetes mellitus;dyslipidemia  Depression Screen PHQ 2/9 Scores 07/14/2017 01/03/2017 09/30/2016 07/05/2016  PHQ - 2 Score 0 0 0 0  Fall Risk Fall Risk  07/14/2017 01/03/2017 09/30/2016 07/05/2016 06/28/2016  Falls in the past year? _0     Cognitive Function: MMSE - Mini Mental State Exam 07/14/2017 07/05/2016 07/05/2016  Not completed: Unable to complete - -  Orientation to time 5 5 -  Orientation to Place 5 5 -  Registration _1 Attention/ Calculation 1 5 -  Recall 3 2 -  Language- name 2 objects 2 2 -  Language- repeat 1 1 -  Language- follow 3 step command 3 3 -  Language- read & follow direction 0 1 -  Write a sentence 0 1 -  Copy design 1 1 -  Total score 24 29 -    Immunizations and Health Maintenance  There is no immunization history on file for this patient. Health Maintenance Due  Topic Date Due  . HEMOGLOBIN A1C  07/03/2017  . URINE MICROALBUMIN  07/05/2017    Patient Care Team: Sharion Balloon, FNP as PCP - General (Nurse Practitioner) Tat, Eustace Quail, DO as Consulting Physician (Neurology) Pyrtle, Lajuan Lines, MD as Consulting Physician (Gastroenterology)  Indicate any recent Medical Services you may have received from other than [facility] providers in the past year (date may be approximate).    Assessment:    Annual Wellness Visit  Type 2 DM , uncontrolled   Screening Tests Health Maintenance  Topic Date Due  . HEMOGLOBIN A1C  07/03/2017  . URINE MICROALBUMIN  07/05/2017  . PNA vac Low Risk Adult (1 of 2 - PCV13) 09/30/2018 (Originally 03/02/2015)  . INFLUENZA VACCINE  10/29/2018 (Originally 07/27/2017)  . OPHTHALMOLOGY EXAM  09/21/2017  . FOOT  EXAM  07/14/2018  . MAMMOGRAM  03/18/2019  . COLONOSCOPY  08/17/2022  . TETANUS/TDAP  12/27/2022  . DEXA SCAN  Completed  . Hepatitis C Screening  Completed        Plan:   During the course of the visit Navya was educated and counseled about the following appropriate screening and preventive services:   Vaccines to include Pneumoccal, Influenza, Td and Shingles - patient continues to declined all vaccines  Colorectal cancer screening - UTD  Cardiovascular disease screening - EKG 2017; ECHO 2014  Diabetes - d/c Januvia; Reviewed patient's formulary and there is a SGLT2 / DPP4 combo that is covered.  QTern 10/71m - tablet 1 tablet daily (contains FIranand Onglyza).  [facility]  Bone Denisty / Osteoporosis Screening - due 11/2017  Mammogram - UTD  Glaucoma screening / Diabetic Eye Exam - UTD - next appt 08/2017 with Dr LMarin Comment Nutrition counseling - Discuss smaller serving of ice cream and to limit how often she eats.  Continue to limit sodium, cholesterol and sugar / CHO.    Advanced Directives - made copy to be placed in chart.  Physical Activity - continue to walk daily  Referral to podiatrist for nail care.   Patient also left [facility]    Orders Placed This Encounter  Procedures  . CMP14+EGFR  . Microalbumin / creatinine urine ratio  . Bayer DCA Hb A1c Waived    Patient Instructions (the written plan) were given to the patient.   TCherre Robins PharmD   07/14/2017    I have reviewed and agree with the above AWV documentation.   CEvelina Dun FNP

## 2017-07-14 NOTE — Patient Instructions (Addendum)
  Ms. Motl , Thank you for taking time to come for your Medicare Wellness Visit. I appreciate your ongoing commitment to your health goals. Please review the following plan we discussed and let me know if I can assist you in the future.   These are the goals we discussed:  Increase non-starchy vegetables - carrots, green bean, squash, zucchini, tomatoes, onions, peppers, spinach and other green leafy vegetables, cabbage, lettuce, cucumbers, asparagus, okra (not fried), eggplant Limit sugar and processed foods (cakes, cookies, ice cream, crackers and chips) Increase fresh fruit but limit serving sizes 1/2 cup or about the size of tennis or baseball Limit red meat to no more than 1-2 times per week (serving size about the size of your palm) Choose whole grains / lean proteins - whole wheat bread, quinoa, whole grain rice (1/2 cup), fish, chicken, [place] Avoid sugar and calorie containing beverages - soda, sweet tea and juice.  Choose water or unsweetened tea instead.  Continue to exercise / walk every day - great job!   This is a list of the screening recommended for you and due dates:  Health Maintenance  Topic Date Due  . Hemoglobin A1C  07/03/2017  . Urine Protein Check  07/05/2017  . Pneumonia vaccines (1 of 2 - PCV13) 09/30/2018*  . Flu Shot  10/29/2018*  . Eye exam for diabetics  09/21/2017  . Complete foot exam   07/14/2018  . Mammogram  03/18/2019  . Colon Cancer Screening  08/17/2022  . Tetanus Vaccine  12/27/2022  . DEXA scan (bone density measurement)  Completed  .  Hepatitis C: One time screening is recommended by Center for Disease Control  (CDC) for  adults born from [birth year] through 1966.   Completed  *Topic was postponed. The date shown is not the original due date.

## 2017-07-15 LAB — CMP14+EGFR
ALBUMIN: 4.2 g/dL (ref 3.6–4.8)
ALT: 29 IU/L (ref 0–32)
AST: 31 IU/L (ref 0–40)
Albumin/Globulin Ratio: 1.4 (ref 1.2–2.2)
Alkaline Phosphatase: 89 IU/L (ref 39–117)
BUN / CREAT RATIO: 14 (ref 12–28)
BUN: 12 mg/dL (ref 8–27)
Bilirubin Total: 0.4 mg/dL (ref 0.0–1.2)
CO2: 22 mmol/L (ref 20–29)
CREATININE: 0.86 mg/dL (ref 0.57–1.00)
Calcium: 9.7 mg/dL (ref 8.7–10.3)
Chloride: 102 mmol/L (ref 96–106)
GFR calc non Af Amer: 70 mL/min/{1.73_m2} (ref >59)
GFR, EST AFRICAN AMERICAN: 81 mL/min/{1.73_m2} (ref >59)
GLOBULIN, TOTAL: 3.1 g/dL (ref 1.5–4.5)
GLUCOSE: 272 mg/dL — AB (ref 65–99)
Potassium: 4.2 mmol/L (ref 3.5–5.2)
Sodium: 140 mmol/L (ref 134–144)
TOTAL PROTEIN: 7.3 g/dL (ref 6.0–8.5)

## 2017-07-16 LAB — MICROALBUMIN / CREATININE URINE RATIO
CREATININE, UR: 151.1 mg/dL
Microalb/Creat Ratio: 9 mg/g creat (ref 0.0–30.0)
Microalbumin, Urine: 13.6 ug/mL

## 2017-07-18 ENCOUNTER — Other Ambulatory Visit: Payer: Self-pay

## 2017-07-18 MED ORDER — DAPAGLIFLOZIN-SAXAGLIPTIN 10-5 MG PO TABS: 1.0000 | ORAL_TABLET | Freq: Every day | ORAL | 1 refills | Status: DC

## 2017-07-25 ENCOUNTER — Other Ambulatory Visit: Payer: Self-pay | Admitting: Pharmacist

## 2017-07-29 ENCOUNTER — Other Ambulatory Visit: Payer: Self-pay | Admitting: Family

## 2017-07-29 DIAGNOSIS — I10 Essential (primary) hypertension: Secondary | ICD-10-CM

## 2017-08-02 DIAGNOSIS — E1142 Type 2 diabetes mellitus with diabetic polyneuropathy: Secondary | ICD-10-CM | POA: Diagnosis not present

## 2017-08-02 DIAGNOSIS — B351 Tinea unguium: Secondary | ICD-10-CM | POA: Diagnosis not present

## 2017-08-08 ENCOUNTER — Telehealth: Payer: Self-pay | Admitting: Family

## 2017-08-08 NOTE — Telephone Encounter (Signed)
Please address

## 2017-08-09 NOTE — Telephone Encounter (Signed)
Per pt, this is a med that Dr Belva Chimes Pt will contact him

## 2017-08-09 NOTE — Telephone Encounter (Signed)
I am unsure of medication this is? Gabapentin for nerve pain/neuropathy?

## 2017-08-10 ENCOUNTER — Other Ambulatory Visit: Payer: Self-pay | Admitting: Family

## 2017-08-10 DIAGNOSIS — E1142 Type 2 diabetes mellitus with diabetic polyneuropathy: Secondary | ICD-10-CM

## 2017-09-14 ENCOUNTER — Other Ambulatory Visit: Payer: Self-pay | Admitting: Family

## 2017-09-14 NOTE — Telephone Encounter (Signed)
Last seen 01/03/17  Renee [name]

## 2017-09-20 LAB — HM DIABETES EYE EXAM

## 2017-10-14 ENCOUNTER — Encounter: Payer: Self-pay | Admitting: Family

## 2017-10-14 ENCOUNTER — Ambulatory Visit (INDEPENDENT_AMBULATORY_CARE_PROVIDER_SITE_OTHER): Payer: Medicare Other | Admitting: Family

## 2017-10-14 VITALS — BP 136/75 | HR 66 | Temp 97.4°F | Ht 66.0 in | Wt 168.0 lb

## 2017-10-14 DIAGNOSIS — E1142 Type 2 diabetes mellitus with diabetic polyneuropathy: Secondary | ICD-10-CM | POA: Diagnosis not present

## 2017-10-14 DIAGNOSIS — K2100 Gastro-esophageal reflux disease with esophagitis, without bleeding: Secondary | ICD-10-CM

## 2017-10-14 DIAGNOSIS — I152 Hypertension secondary to endocrine disorders: Secondary | ICD-10-CM

## 2017-10-14 DIAGNOSIS — E1169 Type 2 diabetes mellitus with other specified complication: Secondary | ICD-10-CM | POA: Diagnosis not present

## 2017-10-14 DIAGNOSIS — E785 Hyperlipidemia, unspecified: Secondary | ICD-10-CM | POA: Diagnosis not present

## 2017-10-14 DIAGNOSIS — Z8673 Personal history of transient ischemic attack (TIA), and cerebral infarction without residual deficits: Secondary | ICD-10-CM

## 2017-10-14 DIAGNOSIS — E663 Overweight: Secondary | ICD-10-CM

## 2017-10-14 DIAGNOSIS — E1165 Type 2 diabetes mellitus with hyperglycemia: Secondary | ICD-10-CM

## 2017-10-14 DIAGNOSIS — E1159 Type 2 diabetes mellitus with other circulatory complications: Secondary | ICD-10-CM | POA: Diagnosis not present

## 2017-10-14 DIAGNOSIS — IMO0002 Reserved for concepts with insufficient information to code with codable children: Secondary | ICD-10-CM

## 2017-10-14 DIAGNOSIS — I10 Essential (primary) hypertension: Secondary | ICD-10-CM

## 2017-10-14 DIAGNOSIS — K5900 Constipation, unspecified: Secondary | ICD-10-CM

## 2017-10-14 MED ORDER — GABAPENTIN 300 MG PO CAPS: 300.0000 mg | ORAL_CAPSULE | Freq: Three times a day (TID) | ORAL | 1 refills | Status: DC

## 2017-10-14 MED ORDER — BISOPROLOL-HYDROCHLOROTHIAZIDE 2.5-6.25 MG PO TABS: 1.0000 | ORAL_TABLET | Freq: Every day | ORAL | 1 refills | Status: DC

## 2017-10-14 MED ORDER — OMEPRAZOLE 20 MG PO CPDR: 20.0000 mg | DELAYED_RELEASE_CAPSULE | Freq: Every day | ORAL | 3 refills | Status: DC

## 2017-10-14 MED ORDER — DAPAGLIFLOZIN-SAXAGLIPTIN 10-5 MG PO TABS: 1.0000 | ORAL_TABLET | Freq: Every day | ORAL | 1 refills | Status: DC

## 2017-10-14 NOTE — Progress Notes (Signed)
Subjective:    Patient ID: Renee [name], female    DOB: 06-19-1951, 66 y.o.   MRN: [id number]  Pt presents to the office today for chronic follow up.  Diabetes  She presents for her follow-up diabetic visit. She has type 2 diabetes mellitus. Her disease course has been worsening. There are no hypoglycemic associated symptoms. Associated symptoms include foot paresthesias. Pertinent negatives for diabetes include no blurred vision, no foot ulcerations and no visual change. Symptoms are worsening. Diabetic complications include a CVA and peripheral neuropathy. Pertinent negatives for diabetic complications include no heart disease or nephropathy. Risk factors for coronary artery disease include diabetes mellitus, dyslipidemia, family history, obesity, hypertension, sedentary lifestyle and post-menopausal. Eye exam is current.  Hypertension  This is a chronic problem. The current episode started more than 1 year ago. The problem has been resolved since onset. The problem is controlled. Pertinent negatives include no blurred vision, malaise/fatigue, peripheral edema or shortness of breath. Risk factors for coronary artery disease include diabetes mellitus, dyslipidemia, family history, post-menopausal state and sedentary lifestyle. The current treatment provides moderate improvement. Hypertensive end-organ damage includes CVA. There is no history of kidney disease or CAD/MI.  Gastroesophageal Reflux  She reports no belching, no coughing or no heartburn. This is a chronic problem. The current episode started more than 1 year ago. The problem occurs occasionally. The problem has been resolved. The symptoms are aggravated by certain foods. She has tried a PPI for the symptoms. The treatment provided moderate relief.  Constipation  This is a chronic problem. The current episode started more than 1 year ago. The problem has been resolved since onset. She has tried diet changes and stool softeners for the  symptoms. The treatment provided moderate relief.  Diabetic Neuropathy Pt complaining bilateral burning pain of 7 out 10. States the gabapentin helps.     Review of Systems  Constitutional: Negative for malaise/fatigue.  Eyes: Negative for blurred vision.  Respiratory: Negative for cough and shortness of breath.   Gastrointestinal: Positive for constipation. Negative for heartburn.  All other systems reviewed and are negative.      Objective:   Physical Exam  Constitutional: She is oriented to person, place, and time. She appears well-developed and well-nourished. No distress.  HENT:  Head: Normocephalic and atraumatic.  Right Ear: External ear normal.  Left Ear: External ear normal.  Nose: Nose normal.  Mouth/Throat: Oropharynx is clear and moist.  Eyes: Pupils are equal, round, and reactive to light.  Neck: Normal range of motion. Neck supple. No thyromegaly present.  Cardiovascular: Normal rate, regular rhythm, normal heart sounds and intact distal pulses.   No murmur heard. Pulmonary/Chest: Effort normal and breath sounds normal. No respiratory distress. She has no wheezes.  Abdominal: Soft. Bowel sounds are normal. She exhibits no distension. There is no tenderness.  Musculoskeletal: Normal range of motion. She exhibits no edema or tenderness.  Neurological: She is alert and oriented to person, place, and time.  Skin: Skin is warm and dry.  Psychiatric: She has a normal mood and affect. Her behavior is normal. Judgment and thought content normal.  Vitals reviewed.     BP 136/75   Pulse 66   Temp (!) 97.4 F (36.3 C) (Oral)   Ht _0  (1.676 m)   Wt 168 lb (76.2 kg)   BMI 27.12 kg/m      Assessment & Plan:  1. Uncontrolled type 2 diabetes mellitus, without long-term current use of insulin ([place]) - Dapagliflozin-Saxagliptin (QTERN)  10-5 MG TABS; Take 1 tablet by mouth daily.  Dispense: 90 tablet; Refill: 1 - Bayer DCA Hb A1c Waived - CMP14+EGFR  2. Overweight  (BMI 25.0-29.9) - CMP14+EGFR  3. Hyperlipidemia associated with type 2 diabetes mellitus (HCC) - CMP14+EGFR - Lipid panel  4. Hypertension associated with diabetes () - CMP14+EGFR - bisoprolol-hydrochlorothiazide (ZIAC) 2.5-6.25 MG tablet; Take 1 tablet by mouth daily.  Dispense: 90 tablet; Refill: 1  5. History of CVA (cerebrovascular accident) - CMP14+EGFR  6. Gastroesophageal reflux disease with esophagitis - CMP14+EGFR - omeprazole (PRILOSEC) 20 MG capsule; Take 1 capsule (20 mg total) by mouth daily.  Dispense: 90 capsule; Refill: 3  7. Diabetic peripheral neuropathy (HCC) - CMP14+EGFR - gabapentin (NEURONTIN) 300 MG capsule; Take 1 capsule (300 mg total) by mouth 3 (three) times daily.  Dispense: 270 capsule; Refill: 1  8. Constipation, unspecified constipation type - CMP14+EGFR  Continue all meds Labs pending Health Maintenance reviewed Diet and exercise encouraged RTO 3 months   Evelina Dun, FNP

## 2017-10-14 NOTE — Patient Instructions (Signed)
Diabetes Mellitus and Food It is important for you to manage your blood sugar (glucose) level. Your blood glucose level can be greatly affected by what you eat. Eating healthier foods in the appropriate amounts throughout the day at about the same time each day will help you control your blood glucose level. It can also help slow or prevent worsening of your diabetes mellitus. Healthy eating may even help you improve the level of your blood pressure and reach or maintain a healthy weight. General recommendations for healthful eating and cooking habits include:  Eating meals and snacks regularly. Avoid going long periods of time without eating to lose weight.  Eating a diet that consists mainly of plant-based foods, such as fruits, vegetables, nuts, legumes, and whole grains.  Using low-heat cooking methods, such as baking, instead of high-heat cooking methods, such as deep frying.  Work with your dietitian to make sure you understand how to use the Nutrition Facts information on food labels. How can food affect me? Carbohydrates Carbohydrates affect your blood glucose level more than any other type of food. Your dietitian will help you determine how many carbohydrates to eat at each meal and teach you how to count carbohydrates. Counting carbohydrates is important to keep your blood glucose at a healthy level, especially if you are using insulin or taking certain medicines for diabetes mellitus. Alcohol Alcohol can cause sudden decreases in blood glucose (hypoglycemia), especially if you use insulin or take certain medicines for diabetes mellitus. Hypoglycemia can be a life-threatening condition. Symptoms of hypoglycemia (sleepiness, dizziness, and disorientation) are similar to symptoms of having too much alcohol. If your health care provider has given you approval to drink alcohol, do so in moderation and use the following guidelines:  Women should not have more than one drink per day, and men  should not have more than two drinks per day. One drink is equal to: ? 12 oz of beer. ? 5 oz of wine. ? 1 oz of hard liquor.  Do not drink on an empty stomach.  Keep yourself hydrated. Have water, diet soda, or unsweetened iced tea.  Regular soda, juice, and other mixers might contain a lot of carbohydrates and should be counted.  What foods are not recommended? As you make food choices, it is important to remember that all foods are not the same. Some foods have fewer nutrients per serving than other foods, even though they might have the same number of calories or carbohydrates. It is difficult to get your body what it needs when you eat foods with fewer nutrients. Examples of foods that you should avoid that are high in calories and carbohydrates but low in nutrients include:  Trans fats (most processed foods list trans fats on the Nutrition Facts label).  Regular soda.  Juice.  Candy.  Sweets, such as cake, pie, doughnuts, and cookies.  Fried foods.  What foods can I eat? Eat nutrient-rich foods, which will nourish your body and keep you healthy. The food you should eat also will depend on several factors, including:  The calories you need.  The medicines you take.  Your weight.  Your blood glucose level.  Your blood pressure level.  Your cholesterol level.  You should eat a variety of foods, including:  Protein. ? Lean cuts of meat. ? Proteins low in saturated fats, such as fish, egg whites, and beans. Avoid processed meats.  Fruits and vegetables. ? Fruits and vegetables that may help control blood glucose levels, such as apples,   mangoes, and yams.  Dairy products. ? Choose fat-free or low-fat dairy products, such as milk, yogurt, and cheese.  Grains, bread, pasta, and rice. ? Choose whole grain products, such as multigrain bread, whole oats, and brown rice. These foods may help control blood pressure.  Fats. ? Foods containing healthful fats, such as  nuts, avocado, olive oil, canola oil, and fish.  Does everyone with diabetes mellitus have the same meal plan? Because every person with diabetes mellitus is different, there is not one meal plan that works for everyone. It is very important that you meet with a dietitian who will help you create a meal plan that is just right for you. This information is not intended to replace advice given to you by your health care provider. Make sure you discuss any questions you have with your health care provider. Document Released: 09/09/2005 Document Revised: 05/20/2016 Document Reviewed: 11/09/2013 Elsevier Interactive Patient Education  2017 [employer].  

## 2017-10-21 ENCOUNTER — Other Ambulatory Visit: Payer: Self-pay | Admitting: Pediatrics

## 2017-10-21 NOTE — Telephone Encounter (Signed)
Last lipid 01/03/17

## 2017-10-26 ENCOUNTER — Other Ambulatory Visit: Payer: Self-pay | Admitting: Pediatrics

## 2017-10-26 NOTE — Telephone Encounter (Signed)
Lmtcb noted pt was seen on 10/14/17, do not see were labs were done

## 2017-10-28 ENCOUNTER — Other Ambulatory Visit: Payer: Medicare Other

## 2017-10-28 ENCOUNTER — Other Ambulatory Visit: Payer: Self-pay | Admitting: *Deleted

## 2017-10-28 DIAGNOSIS — I10 Essential (primary) hypertension: Secondary | ICD-10-CM

## 2017-10-28 DIAGNOSIS — E1159 Type 2 diabetes mellitus with other circulatory complications: Secondary | ICD-10-CM | POA: Diagnosis not present

## 2017-10-28 DIAGNOSIS — E1165 Type 2 diabetes mellitus with hyperglycemia: Secondary | ICD-10-CM

## 2017-10-28 DIAGNOSIS — IMO0002 Reserved for concepts with insufficient information to code with codable children: Secondary | ICD-10-CM

## 2017-10-28 LAB — BAYER DCA HB A1C WAIVED: HB A1C: 9 % — AB (ref <7.0)

## 2017-10-29 LAB — CMP14+EGFR
A/G RATIO: 1.5 (ref 1.2–2.2)
ALBUMIN: 4.3 g/dL (ref 3.6–4.8)
ALK PHOS: 79 IU/L (ref 39–117)
ALT: 22 IU/L (ref 0–32)
AST: 23 IU/L (ref 0–40)
BILIRUBIN TOTAL: 0.3 mg/dL (ref 0.0–1.2)
BUN / CREAT RATIO: 25 (ref 12–28)
BUN: 23 mg/dL (ref 8–27)
CHLORIDE: 100 mmol/L (ref 96–106)
CO2: 22 mmol/L (ref 20–29)
Calcium: 9.5 mg/dL (ref 8.7–10.3)
Creatinine, Ser: 0.91 mg/dL (ref 0.57–1.00)
GFR calc non Af Amer: 65 mL/min/{1.73_m2} (ref >59)
GFR, EST AFRICAN AMERICAN: 76 mL/min/{1.73_m2} (ref >59)
GLUCOSE: 156 mg/dL — AB (ref 65–99)
Globulin, Total: 2.8 g/dL (ref 1.5–4.5)
POTASSIUM: 4.4 mmol/L (ref 3.5–5.2)
Sodium: 139 mmol/L (ref 134–144)
Total Protein: 7.1 g/dL (ref 6.0–8.5)

## 2017-10-31 ENCOUNTER — Other Ambulatory Visit: Payer: Self-pay | Admitting: Family

## 2017-10-31 MED ORDER — GLIPIZIDE 10 MG PO TABS: 10.0000 mg | ORAL_TABLET | Freq: Every day | ORAL | 2 refills | Status: DC

## 2017-11-08 DIAGNOSIS — E1151 Type 2 diabetes mellitus with diabetic peripheral angiopathy without gangrene: Secondary | ICD-10-CM | POA: Diagnosis not present

## 2017-11-08 DIAGNOSIS — M79676 Pain in unspecified toe(s): Secondary | ICD-10-CM | POA: Diagnosis not present

## 2017-11-08 DIAGNOSIS — L84 Corns and callosities: Secondary | ICD-10-CM | POA: Diagnosis not present

## 2017-11-08 DIAGNOSIS — B351 Tinea unguium: Secondary | ICD-10-CM | POA: Diagnosis not present

## 2018-01-18 ENCOUNTER — Telehealth: Payer: Self-pay | Admitting: Family

## 2018-01-18 ENCOUNTER — Other Ambulatory Visit: Payer: Self-pay | Admitting: Family

## 2018-01-18 NOTE — Telephone Encounter (Signed)
Can med be changed? Please advise and route to [facility]

## 2018-01-19 MED ORDER — SITAGLIPTIN PHOSPHATE 100 MG PO TABS: 100.0000 mg | ORAL_TABLET | Freq: Every day | ORAL | 1 refills | Status: DC

## 2018-01-19 MED ORDER — EMPAGLIFLOZIN 25 MG PO TABS: 25.0000 mg | ORAL_TABLET | Freq: Every day | ORAL | 1 refills | Status: DC

## 2018-01-19 NOTE — Telephone Encounter (Signed)
Qtern changed to Jardiance  25 mg and Januvia 100 mg. Strict low carb diet

## 2018-01-19 NOTE — Telephone Encounter (Signed)
Patient aware.

## 2018-01-20 ENCOUNTER — Telehealth: Payer: Self-pay

## 2018-01-20 NOTE — Telephone Encounter (Signed)
Prescription changed

## 2018-01-20 NOTE — Telephone Encounter (Signed)
Insurance denied prior [place] for [employer]

## 2018-01-24 ENCOUNTER — Other Ambulatory Visit: Payer: Self-pay | Admitting: Family

## 2018-01-24 NOTE — Progress Notes (Signed)
Got a message from Luana that [place], [place] were too expensive for patient. Please give her a coupon card. She has [employer] and the coupone that should work with that. We also have samples she could have.

## 2018-01-24 NOTE — Progress Notes (Signed)
4 weeks of samples of Jardiance and Januvia, along with coupon cards for each were placed at the front for pick up.  Patient is aware.

## 2018-02-07 DIAGNOSIS — B351 Tinea unguium: Secondary | ICD-10-CM | POA: Diagnosis not present

## 2018-02-07 DIAGNOSIS — E1151 Type 2 diabetes mellitus with diabetic peripheral angiopathy without gangrene: Secondary | ICD-10-CM | POA: Diagnosis not present

## 2018-02-07 DIAGNOSIS — M79676 Pain in unspecified toe(s): Secondary | ICD-10-CM | POA: Diagnosis not present

## 2018-02-07 DIAGNOSIS — L84 Corns and callosities: Secondary | ICD-10-CM | POA: Diagnosis not present

## 2018-02-27 ENCOUNTER — Other Ambulatory Visit: Payer: Self-pay | Admitting: Family

## 2018-03-13 ENCOUNTER — Telehealth: Payer: Self-pay | Admitting: Family

## 2018-03-13 MED ORDER — INSULIN GLARGINE 300 UNIT/ML ~~LOC~~ SOPN: 10.0000 [IU] | PEN_INJECTOR | Freq: Every day | SUBCUTANEOUS | 2 refills | Status: DC

## 2018-03-13 NOTE — Telephone Encounter (Signed)
Advise if a new medication will be given.

## 2018-03-13 NOTE — Telephone Encounter (Signed)
Pt notified of med change Verbalizes understanding

## 2018-03-13 NOTE — Telephone Encounter (Signed)
How long has patient been taking the Jardiance? We will stop to just make sure, strict low carb diet. We will add Toujeo 10 units every night.

## 2018-03-14 ENCOUNTER — Telehealth: Payer: Self-pay

## 2018-03-14 MED ORDER — INSULIN DEGLUDEC 100 UNIT/ML ~~LOC~~ SOPN: 10.0000 [IU] | PEN_INJECTOR | Freq: Every day | SUBCUTANEOUS | 1 refills | Status: DC

## 2018-03-14 NOTE — Telephone Encounter (Signed)
Toujeo not formulary  Formulary are [employer] and [place] Aas  Can you change to one of these?

## 2018-03-14 NOTE — Telephone Encounter (Signed)
Changed Toujeo to [place] per insurance

## 2018-03-29 ENCOUNTER — Other Ambulatory Visit: Payer: Self-pay | Admitting: Family

## 2018-03-29 NOTE — Telephone Encounter (Signed)
Last seen 10/14/17  [facility]

## 2018-04-24 ENCOUNTER — Other Ambulatory Visit: Payer: Self-pay | Admitting: Family

## 2018-04-24 DIAGNOSIS — I10 Essential (primary) hypertension: Principal | ICD-10-CM

## 2018-04-24 DIAGNOSIS — E1159 Type 2 diabetes mellitus with other circulatory complications: Secondary | ICD-10-CM

## 2018-04-24 DIAGNOSIS — I152 Hypertension secondary to endocrine disorders: Secondary | ICD-10-CM

## 2018-04-25 DIAGNOSIS — Z1231 Encounter for screening mammogram for malignant neoplasm of breast: Secondary | ICD-10-CM | POA: Diagnosis not present

## 2018-04-25 LAB — HM MAMMOGRAPHY

## 2018-04-28 ENCOUNTER — Other Ambulatory Visit: Payer: Self-pay | Admitting: Family

## 2018-04-28 NOTE — Telephone Encounter (Signed)
Last seen 10/14/17  [facility]

## 2018-05-01 ENCOUNTER — Other Ambulatory Visit: Payer: Self-pay | Admitting: Family

## 2018-05-02 NOTE — Telephone Encounter (Signed)
Last seen 10.19.18

## 2018-05-09 DIAGNOSIS — E1151 Type 2 diabetes mellitus with diabetic peripheral angiopathy without gangrene: Secondary | ICD-10-CM | POA: Diagnosis not present

## 2018-05-09 DIAGNOSIS — M79676 Pain in unspecified toe(s): Secondary | ICD-10-CM | POA: Diagnosis not present

## 2018-05-09 DIAGNOSIS — L84 Corns and callosities: Secondary | ICD-10-CM | POA: Diagnosis not present

## 2018-05-09 DIAGNOSIS — B351 Tinea unguium: Secondary | ICD-10-CM | POA: Diagnosis not present

## 2018-05-26 ENCOUNTER — Other Ambulatory Visit: Payer: Self-pay | Admitting: Family

## 2018-05-26 DIAGNOSIS — E1142 Type 2 diabetes mellitus with diabetic polyneuropathy: Secondary | ICD-10-CM

## 2018-05-26 NOTE — Telephone Encounter (Signed)
Last seen 10/14/17  [facility]

## 2018-05-29 ENCOUNTER — Other Ambulatory Visit: Payer: Self-pay | Admitting: Family

## 2018-05-29 NOTE — Telephone Encounter (Signed)
Last seen 10/14/17  [facility]

## 2018-07-05 ENCOUNTER — Other Ambulatory Visit: Payer: Self-pay | Admitting: Family

## 2018-07-10 ENCOUNTER — Other Ambulatory Visit: Payer: Self-pay | Admitting: Family

## 2018-07-11 NOTE — Telephone Encounter (Signed)
Patient NTBS for follow up and lab work  

## 2018-07-11 NOTE — Telephone Encounter (Signed)
Last seen 10.19.18

## 2018-07-11 NOTE — Telephone Encounter (Signed)
Left voice mail to schedule a follow up appointment with lab work for further refills.

## 2018-07-18 ENCOUNTER — Ambulatory Visit (INDEPENDENT_AMBULATORY_CARE_PROVIDER_SITE_OTHER): Payer: Medicare Other | Admitting: Family

## 2018-07-18 ENCOUNTER — Encounter: Payer: Self-pay | Admitting: Family

## 2018-07-18 VITALS — BP 118/65 | HR 64 | Temp 97.3°F | Ht 66.0 in | Wt 177.2 lb

## 2018-07-18 DIAGNOSIS — E1142 Type 2 diabetes mellitus with diabetic polyneuropathy: Secondary | ICD-10-CM | POA: Diagnosis not present

## 2018-07-18 DIAGNOSIS — E1159 Type 2 diabetes mellitus with other circulatory complications: Secondary | ICD-10-CM | POA: Diagnosis not present

## 2018-07-18 DIAGNOSIS — I10 Essential (primary) hypertension: Secondary | ICD-10-CM | POA: Diagnosis not present

## 2018-07-18 DIAGNOSIS — K2100 Gastro-esophageal reflux disease with esophagitis, without bleeding: Secondary | ICD-10-CM

## 2018-07-18 DIAGNOSIS — E785 Hyperlipidemia, unspecified: Secondary | ICD-10-CM | POA: Diagnosis not present

## 2018-07-18 DIAGNOSIS — H00012 Hordeolum externum right lower eyelid: Secondary | ICD-10-CM

## 2018-07-18 DIAGNOSIS — Z8673 Personal history of transient ischemic attack (TIA), and cerebral infarction without residual deficits: Secondary | ICD-10-CM

## 2018-07-18 DIAGNOSIS — E1165 Type 2 diabetes mellitus with hyperglycemia: Secondary | ICD-10-CM

## 2018-07-18 DIAGNOSIS — K5900 Constipation, unspecified: Secondary | ICD-10-CM

## 2018-07-18 DIAGNOSIS — E1169 Type 2 diabetes mellitus with other specified complication: Secondary | ICD-10-CM | POA: Diagnosis not present

## 2018-07-18 DIAGNOSIS — E663 Overweight: Secondary | ICD-10-CM

## 2018-07-18 DIAGNOSIS — IMO0002 Reserved for concepts with insufficient information to code with codable children: Secondary | ICD-10-CM

## 2018-07-18 DIAGNOSIS — I152 Hypertension secondary to endocrine disorders: Secondary | ICD-10-CM

## 2018-07-18 LAB — BAYER DCA HB A1C WAIVED: HB A1C (BAYER DCA - WAIVED): 8.4 % — ABNORMAL HIGH (ref <7.0)

## 2018-07-18 MED ORDER — SITAGLIPTIN PHOSPHATE 100 MG PO TABS: 100.0000 mg | ORAL_TABLET | Freq: Every day | ORAL | 1 refills | Status: DC

## 2018-07-18 MED ORDER — GLIPIZIDE 10 MG PO TABS: ORAL_TABLET | ORAL | 1 refills | Status: DC

## 2018-07-18 MED ORDER — INSULIN DEGLUDEC 100 UNIT/ML ~~LOC~~ SOPN: 10.0000 [IU] | PEN_INJECTOR | Freq: Every day | SUBCUTANEOUS | 1 refills | Status: DC

## 2018-07-18 MED ORDER — GABAPENTIN 300 MG PO CAPS
300.0000 mg | ORAL_CAPSULE | Freq: Three times a day (TID) | ORAL | 1 refills | Status: DC
Start: 2018-07-18 — End: 2018-11-20

## 2018-07-18 MED ORDER — BACITRACIN 500 UNIT/GM OP OINT: 1.0000 "application " | TOPICAL_OINTMENT | Freq: Four times a day (QID) | OPHTHALMIC | 0 refills | Status: DC

## 2018-07-18 MED ORDER — SIMVASTATIN 40 MG PO TABS: ORAL_TABLET | ORAL | 1 refills | Status: DC

## 2018-07-18 MED ORDER — OMEPRAZOLE 20 MG PO CPDR: 20.0000 mg | DELAYED_RELEASE_CAPSULE | Freq: Every day | ORAL | 3 refills | Status: DC

## 2018-07-18 NOTE — Progress Notes (Signed)
Subjective:    Patient ID: Renee [name], female    DOB: [birth year], 67 y.o.   MRN: [id number]  Chief Complaint  Patient presents with  . Medical Management of Chronic Issues    six month recheck    Diabetes  She presents for her follow-up diabetic visit. She has type 2 diabetes mellitus. Her disease course has been worsening. There are no hypoglycemic associated symptoms. Associated symptoms include foot paresthesias. Pertinent negatives for diabetes include no blurred vision. There are no hypoglycemic complications. Symptoms are stable. Diabetic complications include a CVA and peripheral neuropathy. Pertinent negatives for diabetic complications include no heart disease or nephropathy. Risk factors for coronary artery disease include dyslipidemia, diabetes mellitus and hypertension. She is following a generally unhealthy diet. Her overall blood glucose range is 140-180 mg/dl. Eye exam is current.  Hypertension  This is a chronic problem. The current episode started more than 1 year ago. The problem has been waxing and waning since onset. The problem is controlled. Pertinent negatives include no blurred vision, peripheral edema or shortness of breath. Risk factors for coronary artery disease include dyslipidemia, diabetes mellitus, obesity and sedentary lifestyle. The current treatment provides moderate improvement. Hypertensive end-organ damage includes CAD/MI and CVA. There is no history of kidney disease.  Gastroesophageal Reflux  She reports no belching or no heartburn. This is a chronic problem. The current episode started more than 1 year ago. The problem occurs occasionally. The problem has been waxing and waning. The symptoms are aggravated by certain foods. Risk factors include obesity. She has tried a PPI for the symptoms.  Hyperlipidemia  This is a chronic problem. The current episode started more than 1 year ago. The problem is controlled. Recent lipid tests were reviewed and are  normal. Pertinent negatives include no shortness of breath. Current antihyperlipidemic treatment includes statins. The current treatment provides moderate improvement of lipids. Risk factors for coronary artery disease include dyslipidemia, diabetes mellitus, family history, hypertension, a sedentary lifestyle and post-menopausal.  Constipation  This is a chronic problem. The current episode started more than 1 year ago. The problem has been waxing and waning since onset. She has tried laxatives for the symptoms. The treatment provided moderate relief.      Review of Systems  Eyes: Negative for blurred vision.  Respiratory: Negative for shortness of breath.   Gastrointestinal: Positive for constipation. Negative for heartburn.  All other systems reviewed and are negative.      Objective:   Physical Exam  Constitutional: She is oriented to person, place, and time. She appears well-developed and well-nourished. No distress.  HENT:  Head: Normocephalic and atraumatic.  Right Ear: External ear normal.  Left Ear: External ear normal.  Mouth/Throat: Oropharynx is clear and moist.  Eyes: Pupils are equal, round, and reactive to light. Right eye exhibits hordeolum.    Neck: Normal range of motion. Neck supple. No thyromegaly present.  Cardiovascular: Normal rate, regular rhythm, normal heart sounds and intact distal pulses.  No murmur heard. Pulmonary/Chest: Effort normal and breath sounds normal. No respiratory distress. She has no wheezes.  Abdominal: Soft. Bowel sounds are normal. She exhibits no distension. There is no tenderness.  Musculoskeletal: Normal range of motion. She exhibits no edema or tenderness.  Neurological: She is alert and oriented to person, place, and time. She has normal reflexes. No cranial nerve deficit.  Skin: Skin is warm and dry.  Psychiatric: She has a normal mood and affect. Her behavior is normal. Judgment and thought content  normal.  Vitals  reviewed.     BP 118/65   Pulse 64   Temp (!) 97.3 F (36.3 C) (Oral)   Ht '5\' 6"'  (1.676 m)   Wt 177 lb 3.2 oz (80.4 kg)   BMI 28.60 kg/m      Assessment & Plan:  Renee [name] comes in today with chief complaint of Medical Management of Chronic Issues (six month recheck)   Diagnosis and orders addressed:  1. Diabetic peripheral neuropathy (HCC) - gabapentin (NEURONTIN) 300 MG capsule; Take 1 capsule (300 mg total) by mouth 3 (three) times daily.  Dispense: 270 capsule; Refill: 1 - CMP14+EGFR  2. Gastroesophageal reflux disease with esophagitis - omeprazole (PRILOSEC) 20 MG capsule; Take 1 capsule (20 mg total) by mouth daily.  Dispense: 90 capsule; Refill: 3 - CMP14+EGFR  3. Hyperlipidemia associated with type 2 diabetes mellitus (HCC) - simvastatin (ZOCOR) 40 MG tablet; TAKE 1 TABLET (40 MG TOTAL) BY MOUTH AT BEDTIME.  Dispense: 90 tablet; Refill: 1 - sitaGLIPtin (JANUVIA) 100 MG tablet; Take 1 tablet (100 mg total) by mouth daily.  Dispense: 90 tablet; Refill: 1 - CMP14+EGFR - Lipid panel  4. Hypertension associated with diabetes ([place])  - CMP14+EGFR  5. Uncontrolled type 2 diabetes mellitus, without long-term current use of insulin (HCC) - glipiZIDE (GLUCOTROL) 10 MG tablet; TAKE 1 TABLET BY MOUTH ONCE DAILY IN THE MORNING BEFORE BREAKFAST  Dispense: 90 tablet; Refill: 1 - insulin degludec (TRESIBA FLEXTOUCH) 100 UNIT/ML SOPN FlexTouch Pen; Inject 0.1 mLs (10 Units total) into the skin daily at 10 pm.  Dispense: 12 pen; Refill: 1 - Bayer DCA Hb A1c Waived - CMP14+EGFR - Microalbumin / creatinine urine ratio  6. Overweight (BMI 25.0-29.9)  - CMP14+EGFR  7. History of CVA (cerebrovascular accident) - CMP14+EGFR  8. Constipation, unspecified constipation type - CMP14+EGFR  9. Hordeolum externum of right lower eyelid Warm compresses Good hand hygiene - bacitracin ophthalmic ointment; Place 1 application into the right eye 4 (four) times daily. apply to eye   Dispense: 3.5 g; Refill: 0   Labs pending Health Maintenance reviewed Diet and exercise encouraged  Follow up plan: 4 months    Evelina Dun, FNP

## 2018-07-18 NOTE — Patient Instructions (Addendum)

## 2018-07-19 LAB — CMP14+EGFR
ALBUMIN: 4.3 g/dL (ref 3.6–4.8)
ALT: 23 IU/L (ref 0–32)
AST: 22 IU/L (ref 0–40)
Albumin/Globulin Ratio: 1.5 (ref 1.2–2.2)
Alkaline Phosphatase: 84 IU/L (ref 39–117)
BILIRUBIN TOTAL: 0.4 mg/dL (ref 0.0–1.2)
BUN / CREAT RATIO: 16 (ref 12–28)
BUN: 15 mg/dL (ref 8–27)
CHLORIDE: 103 mmol/L (ref 96–106)
CO2: 25 mmol/L (ref 20–29)
Calcium: 9.9 mg/dL (ref 8.7–10.3)
Creatinine, Ser: 0.93 mg/dL (ref 0.57–1.00)
GFR calc Af Amer: 73 mL/min/{1.73_m2} (ref >59)
GFR calc non Af Amer: 63 mL/min/{1.73_m2} (ref >59)
GLOBULIN, TOTAL: 2.9 g/dL (ref 1.5–4.5)
GLUCOSE: 195 mg/dL — AB (ref 65–99)
Potassium: 4.5 mmol/L (ref 3.5–5.2)
SODIUM: 141 mmol/L (ref 134–144)
Total Protein: 7.2 g/dL (ref 6.0–8.5)

## 2018-07-19 LAB — LIPID PANEL
CHOL/HDL RATIO: 3.1 ratio (ref 0.0–4.4)
Cholesterol, Total: 135 mg/dL (ref 100–199)
HDL: 43 mg/dL (ref >39)
LDL CALC: 67 mg/dL (ref 0–99)
Triglycerides: 125 mg/dL (ref 0–149)
VLDL Cholesterol Cal: 25 mg/dL (ref 5–40)

## 2018-07-19 LAB — MICROALBUMIN / CREATININE URINE RATIO
CREATININE, UR: 207.6 mg/dL
MICROALBUM., U, RANDOM: 12.6 ug/mL
Microalb/Creat Ratio: 6.1 mg/g creat (ref 0.0–30.0)

## 2018-07-20 ENCOUNTER — Other Ambulatory Visit: Payer: Self-pay | Admitting: Family

## 2018-07-20 DIAGNOSIS — IMO0002 Reserved for concepts with insufficient information to code with codable children: Secondary | ICD-10-CM

## 2018-07-20 DIAGNOSIS — E1165 Type 2 diabetes mellitus with hyperglycemia: Secondary | ICD-10-CM

## 2018-07-20 MED ORDER — INSULIN DEGLUDEC 100 UNIT/ML ~~LOC~~ SOPN: 15.0000 [IU] | PEN_INJECTOR | Freq: Every day | SUBCUTANEOUS | 1 refills | Status: DC

## 2018-07-24 ENCOUNTER — Other Ambulatory Visit: Payer: Self-pay | Admitting: Family

## 2018-07-24 DIAGNOSIS — I152 Hypertension secondary to endocrine disorders: Secondary | ICD-10-CM

## 2018-07-24 DIAGNOSIS — I10 Essential (primary) hypertension: Principal | ICD-10-CM

## 2018-07-24 DIAGNOSIS — E1159 Type 2 diabetes mellitus with other circulatory complications: Secondary | ICD-10-CM

## 2018-08-08 DIAGNOSIS — L84 Corns and callosities: Secondary | ICD-10-CM | POA: Diagnosis not present

## 2018-08-08 DIAGNOSIS — E1151 Type 2 diabetes mellitus with diabetic peripheral angiopathy without gangrene: Secondary | ICD-10-CM | POA: Diagnosis not present

## 2018-08-08 DIAGNOSIS — B351 Tinea unguium: Secondary | ICD-10-CM | POA: Diagnosis not present

## 2018-08-08 DIAGNOSIS — M79676 Pain in unspecified toe(s): Secondary | ICD-10-CM | POA: Diagnosis not present

## 2018-08-21 ENCOUNTER — Ambulatory Visit (INDEPENDENT_AMBULATORY_CARE_PROVIDER_SITE_OTHER): Payer: Medicare Other | Admitting: Family

## 2018-08-21 ENCOUNTER — Encounter: Payer: Self-pay | Admitting: Family

## 2018-08-21 VITALS — BP 113/65 | HR 67 | Temp 96.5°F | Ht 66.0 in | Wt 178.0 lb

## 2018-08-21 DIAGNOSIS — E1165 Type 2 diabetes mellitus with hyperglycemia: Secondary | ICD-10-CM

## 2018-08-21 DIAGNOSIS — IMO0002 Reserved for concepts with insufficient information to code with codable children: Secondary | ICD-10-CM

## 2018-08-21 LAB — BAYER DCA HB A1C WAIVED: HB A1C (BAYER DCA - WAIVED): 8.2 % — ABNORMAL HIGH (ref <7.0)

## 2018-08-21 MED ORDER — GLUCOSE BLOOD VI STRP: ORAL_STRIP | 12 refills | Status: DC

## 2018-08-21 NOTE — Progress Notes (Signed)
   Subjective:    Patient ID: DEEYA RICHESON, female    DOB: 1951/08/14, 67 y.o.   MRN: [id number]  Chief Complaint  Patient presents with  . Diabetes    one month recheck    Diabetes  She presents for her follow-up diabetic visit. She has type 2 diabetes mellitus. Her disease course has been stable. There are no hypoglycemic associated symptoms. Associated symptoms include foot paresthesias. Pertinent negatives for diabetes include no blurred vision and no visual change. There are no hypoglycemic complications. Symptoms are worsening. Diabetic complications include a CVA and heart disease. Risk factors for coronary artery disease include dyslipidemia, diabetes mellitus, hypertension, sedentary lifestyle and post-menopausal. She is following a generally unhealthy diet. Her overall blood glucose range is 140-180 mg/dl. Eye exam is current.      Review of Systems  Eyes: Negative for blurred vision.  All other systems reviewed and are negative.      Objective:   Physical Exam  Constitutional: She is oriented to person, place, and time. She appears well-developed and well-nourished. No distress.  HENT:  Head: Normocephalic and atraumatic.  Right Ear: External ear normal.  Left Ear: External ear normal.  Mouth/Throat: Oropharynx is clear and moist.  Eyes: Pupils are equal, round, and reactive to light.  Neck: Normal range of motion. Neck supple. No thyromegaly present.  Cardiovascular: Normal rate, regular rhythm, normal heart sounds and intact distal pulses.  No murmur heard. Pulmonary/Chest: Effort normal and breath sounds normal. No respiratory distress. She has no wheezes.  Abdominal: Soft. Bowel sounds are normal. She exhibits no distension. There is no tenderness.  Musculoskeletal: Normal range of motion. She exhibits no edema or tenderness.  Neurological: She is alert and oriented to person, place, and time. She has normal reflexes. No cranial nerve deficit.  Skin: Skin is warm  and dry.  Psychiatric: She has a normal mood and affect. Her behavior is normal. Judgment and thought content normal.  Vitals reviewed.     BP 113/65   Pulse 67   Temp (!) 96.5 F (35.8 C) (Oral)   Ht '5\' 6"'$  (1.676 m)   Wt 178 lb (80.7 kg)   BMI 28.73 kg/m      Assessment & Plan:  ASHELEY HELLBERG comes in today with chief complaint of Diabetes (one month recheck)   Diagnosis and orders addressed:  1. Uncontrolled type 2 diabetes mellitus, without long-term current use of insulin (HCC) Strict low carb diet  Continue medications  RTO in 3 months to recheck  - Bayer DCA Hb A1c Waived - BMP8+EGFR - glucose blood (GLUCOSE METER TEST) test strip; Use BID  Dispense: 100 each; Refill: [place], FNP

## 2018-08-21 NOTE — Patient Instructions (Signed)
Diabetes Mellitus and Nutrition When you have diabetes (diabetes mellitus), it is very important to have healthy eating habits because your blood sugar (glucose) levels are greatly affected by what you eat and drink. Eating healthy foods in the appropriate amounts, at about the same times every day, can help you:  Control your blood glucose.  Lower your risk of heart disease.  Improve your blood pressure.  Reach or maintain a healthy weight.  Every person with diabetes is different, and each person has different needs for a meal plan. Your health care provider may recommend that you work with a diet and nutrition specialist (dietitian) to make a meal plan that is best for you. Your meal plan may vary depending on factors such as:  The calories you need.  The medicines you take.  Your weight.  Your blood glucose, blood pressure, and cholesterol levels.  Your activity level.  Other health conditions you have, such as heart or kidney disease.  How do carbohydrates affect me? Carbohydrates affect your blood glucose level more than any other type of food. Eating carbohydrates naturally increases the amount of glucose in your blood. Carbohydrate counting is a method for keeping track of how many carbohydrates you eat. Counting carbohydrates is important to keep your blood glucose at a healthy level, especially if you use insulin or take certain oral diabetes medicines. It is important to know how many carbohydrates you can safely have in each meal. This is different for every person. Your dietitian can help you calculate how many carbohydrates you should have at each meal and for snack. Foods that contain carbohydrates include:  Bread, cereal, rice, pasta, and crackers.  Potatoes and corn.  Peas, beans, and lentils.  Milk and yogurt.  Fruit and juice.  Desserts, such as cakes, cookies, ice cream, and candy.  How does alcohol affect me? Alcohol can cause a sudden decrease in blood  glucose (hypoglycemia), especially if you use insulin or take certain oral diabetes medicines. Hypoglycemia can be a life-threatening condition. Symptoms of hypoglycemia (sleepiness, dizziness, and confusion) are similar to symptoms of having too much alcohol. If your health care provider says that alcohol is safe for you, follow these guidelines:  Limit alcohol intake to no more than 1 drink per day for nonpregnant women and 2 drinks per day for men. One drink equals 12 oz of beer, 5 oz of wine, or 1 oz of hard liquor.  Do not drink on an empty stomach.  Keep yourself hydrated with water, diet soda, or unsweetened iced tea.  Keep in mind that regular soda, juice, and other mixers may contain a lot of sugar and must be counted as carbohydrates.  What are tips for following this plan? Reading food labels  Start by checking the serving size on the label. The amount of calories, carbohydrates, fats, and other nutrients listed on the label are based on one serving of the food. Many foods contain more than one serving per package.  Check the total grams (g) of carbohydrates in one serving. You can calculate the number of servings of carbohydrates in one serving by dividing the total carbohydrates by 15. For example, if a food has 30 g of total carbohydrates, it would be equal to 2 servings of carbohydrates.  Check the number of grams (g) of saturated and trans fats in one serving. Choose foods that have low or no amount of these fats.  Check the number of milligrams (mg) of sodium in one serving. Most people   should limit total sodium intake to less than 2,300 mg per day.  Always check the nutrition information of foods labeled as "low-fat" or "nonfat". These foods may be higher in added sugar or refined carbohydrates and should be avoided.  Talk to your dietitian to identify your daily goals for nutrients listed on the label. Shopping  Avoid buying canned, premade, or processed foods. These  foods tend to be high in fat, sodium, and added sugar.  Shop around the outside edge of the grocery store. This includes fresh fruits and vegetables, bulk grains, fresh meats, and fresh dairy. Cooking  Use low-heat cooking methods, such as baking, instead of high-heat cooking methods like deep frying.  Cook using healthy oils, such as olive, canola, or sunflower oil.  Avoid cooking with butter, cream, or high-fat meats. Meal planning  Eat meals and snacks regularly, preferably at the same times every day. Avoid going long periods of time without eating.  Eat foods high in fiber, such as fresh fruits, vegetables, beans, and whole grains. Talk to your dietitian about how many servings of carbohydrates you can eat at each meal.  Eat 4-6 ounces of lean protein each day, such as lean meat, chicken, fish, eggs, or tofu. 1 ounce is equal to 1 ounce of meat, chicken, or fish, 1 egg, or 1/4 cup of tofu.  Eat some foods each day that contain healthy fats, such as avocado, nuts, seeds, and fish. Lifestyle   Check your blood glucose regularly.  Exercise at least 30 minutes 5 or more days each week, or as told by your health care provider.  Take medicines as told by your health care provider.  Do not use any products that contain nicotine or tobacco, such as cigarettes and e-cigarettes. If you need help quitting, ask your health care provider.  Work with a counselor or diabetes educator to identify strategies to manage stress and any emotional and social challenges. What are some questions to ask my health care provider?  Do I need to meet with a diabetes educator?  Do I need to meet with a dietitian?  What number can I call if I have questions?  When are the best times to check my blood glucose? Where to find more information:  American Diabetes Association: diabetes.org/food-and-fitness/food  Academy of Nutrition and Dietetics:  www.eatright.org/resources/health/diseases-and-conditions/diabetes  National Institute of Diabetes and Digestive and Kidney Diseases (NIH): www.niddk.nih.gov/health-information/diabetes/overview/diet-eating-physical-activity Summary  A healthy meal plan will help you control your blood glucose and maintain a healthy lifestyle.  Working with a diet and nutrition specialist (dietitian) can help you make a meal plan that is best for you.  Keep in mind that carbohydrates and alcohol have immediate effects on your blood glucose levels. It is important to count carbohydrates and to use alcohol carefully. This information is not intended to replace advice given to you by your health care provider. Make sure you discuss any questions you have with your health care provider. Document Released: 09/09/2005 Document Revised: 01/17/2017 Document Reviewed: 01/17/2017 Elsevier Interactive Patient Education  2018 Elsevier Inc.  

## 2018-08-22 LAB — BMP8+EGFR
BUN / CREAT RATIO: 16 (ref 12–28)
BUN: 14 mg/dL (ref 8–27)
CO2: 23 mmol/L (ref 20–29)
CREATININE: 0.89 mg/dL (ref 0.57–1.00)
Calcium: 10.1 mg/dL (ref 8.7–10.3)
Chloride: 102 mmol/L (ref 96–106)
GFR calc Af Amer: 77 mL/min/{1.73_m2} (ref >59)
GFR, EST NON AFRICAN AMERICAN: 67 mL/min/{1.73_m2} (ref >59)
GLUCOSE: 134 mg/dL — AB (ref 65–99)
Potassium: 4.8 mmol/L (ref 3.5–5.2)
Sodium: 141 mmol/L (ref 134–144)

## 2018-09-07 ENCOUNTER — Other Ambulatory Visit: Payer: Self-pay | Admitting: *Deleted

## 2018-09-07 MED ORDER — GLUCOSE BLOOD VI STRP: ORAL_STRIP | 3 refills | Status: DC

## 2018-09-14 ENCOUNTER — Ambulatory Visit (INDEPENDENT_AMBULATORY_CARE_PROVIDER_SITE_OTHER): Payer: Medicare Other | Admitting: Family

## 2018-09-14 ENCOUNTER — Encounter: Payer: Self-pay | Admitting: Family

## 2018-09-14 VITALS — BP 134/67 | HR 68 | Temp 96.9°F | Ht 66.0 in | Wt 179.0 lb

## 2018-09-14 DIAGNOSIS — B372 Candidiasis of skin and nail: Secondary | ICD-10-CM | POA: Diagnosis not present

## 2018-09-14 MED ORDER — FLUCONAZOLE 150 MG PO TABS: 150.0000 mg | ORAL_TABLET | ORAL | 0 refills | Status: DC | PRN

## 2018-09-14 MED ORDER — NYSTATIN 100000 UNIT/GM EX POWD: Freq: Four times a day (QID) | CUTANEOUS | 0 refills | Status: DC

## 2018-09-14 MED ORDER — NYSTATIN 100000 UNIT/GM EX CREA: 1.0000 "application " | TOPICAL_CREAM | Freq: Two times a day (BID) | CUTANEOUS | 0 refills | Status: DC

## 2018-09-14 NOTE — Patient Instructions (Signed)
Skin Yeast Infection Skin yeast infection is a condition in which there is an overgrowth of yeast (candida) that normally lives on the skin. This condition usually occurs in areas of the skin that are constantly warm and moist, such as the armpits or the groin. What are the causes? This condition is caused by a change in the normal balance of the yeast and bacteria that live on the skin. What increases the risk? This condition is more likely to develop in:  People who are obese.  Pregnant women.  Women who take birth control pills.  People who have diabetes.  People who take antibiotic medicines.  People who take steroid medicines.  People who are malnourished.  People who have a weak defense (immune) system.  People who are 65 years of age or older.  What are the signs or symptoms? Symptoms of this condition include:  A red, swollen area of the skin.  Bumps on the skin.  Itchiness.  How is this diagnosed? This condition is diagnosed with a medical history and physical exam. Your health care provider may check for yeast by taking light scrapings of the skin to be viewed under a microscope. How is this treated? This condition is treated with medicine. Medicines may be prescribed or be available over-the-counter. The medicines may be:  Taken by mouth (orally).  Applied as a cream.  Follow these instructions at home:  Take or apply over-the-counter and prescription medicines only as told by your health care provider.  Eat more yogurt. This may help to keep your yeast infection from returning.  Maintain a healthy weight. If you need help losing weight, talk with your health care provider.  Keep your skin clean and dry.  If you have diabetes, keep your blood sugar under control. Contact a health care provider if:  Your symptoms go away and then return.  Your symptoms do not get better with treatment.  Your symptoms get worse.  Your rash spreads.  You have a  fever or chills.  You have new symptoms.  You have new warmth or redness of your skin. This information is not intended to replace advice given to you by your health care provider. Make sure you discuss any questions you have with your health care provider. Document Released: 08/31/2011 Document Revised: 08/08/2016 Document Reviewed: 06/16/2015 Elsevier Interactive Patient Education  2018 Elsevier Inc.  

## 2018-09-14 NOTE — Progress Notes (Signed)
   Subjective:    Patient ID: Renee [name], female    DOB: [birth year], 67 y.o.   MRN: [id number]  Chief Complaint  Patient presents with  . Rash    pt here today c/o itching all over body and "broke out" on lower stomach and private area    Rash  This is a new problem. The current episode started yesterday. The problem is unchanged. The affected locations include the groin and abdomen. The rash is characterized by itchiness and redness. She was exposed to nothing. Pertinent negatives include no congestion, cough, diarrhea, joint pain, rhinorrhea or shortness of breath. Past treatments include nothing. The treatment provided no relief.      Review of Systems  HENT: Negative for congestion and rhinorrhea.   Respiratory: Negative for cough and shortness of breath.   Gastrointestinal: Negative for diarrhea.  Musculoskeletal: Negative for joint pain.  Skin: Positive for rash.  All other systems reviewed and are negative.      Objective:   Physical Exam  Constitutional: She is oriented to person, place, and time. She appears well-developed and well-nourished. No distress.  HENT:  Head: Normocephalic and atraumatic.  Eyes: Pupils are equal, round, and reactive to light.  Neck: Normal range of motion. Neck supple. No thyromegaly present.  Cardiovascular: Normal rate, regular rhythm, normal heart sounds and intact distal pulses.  No murmur heard. Pulmonary/Chest: Effort normal and breath sounds normal. No respiratory distress. She has no wheezes.  Abdominal: Soft. Bowel sounds are normal. She exhibits no distension. There is no tenderness.  Musculoskeletal: Normal range of motion. She exhibits no edema or tenderness.  Neurological: She is alert and oriented to person, place, and time. She has normal reflexes. No cranial nerve deficit.  Skin: Skin is warm and dry. Rash noted. There is erythema.  Generalized rash in lower abdomen and bilateral groin  Psychiatric: She has a normal mood  and affect. Her behavior is normal. Judgment and thought content normal.  Vitals reviewed.     BP 134/67   Pulse 68   Temp (!) 96.9 F (36.1 C) (Oral)   Ht 5\' 6"  (1.676 m)   Wt 179 lb (81.2 kg)   BMI 28.89 kg/m      Assessment & Plan:  Renee [name] comes in today with chief complaint of Rash (pt here today c/o itching all over body and "broke out" on lower stomach and private area)   Diagnosis and orders addressed:  1. Candidal skin infection Rest Do not scratch Warm compresses RTO if symptoms worsen or do not improve  - nystatin (MYCOSTATIN/NYSTOP) powder; Apply topically 4 (four) times daily.  Dispense: 15 g; Refill: 0 - nystatin cream (MYCOSTATIN); Apply 1 application topically 2 (two) times daily.  Dispense: 30 g; Refill: 0 - fluconazole (DIFLUCAN) 150 MG tablet; Take 1 tablet (150 mg total) by mouth every three (3) days as needed.  Dispense: 3 tablet; Refill: 0   Evelina Dun, FNP

## 2018-09-18 ENCOUNTER — Telehealth: Payer: Self-pay | Admitting: Family

## 2018-09-18 NOTE — Telephone Encounter (Signed)
One month of samples up front pt aware

## 2018-09-21 LAB — HM DIABETES EYE EXAM

## 2018-10-19 ENCOUNTER — Other Ambulatory Visit: Payer: Self-pay | Admitting: Family

## 2018-10-19 NOTE — Telephone Encounter (Signed)
No samples, pt aware 

## 2018-10-26 ENCOUNTER — Telehealth: Payer: Self-pay | Admitting: Family

## 2018-10-26 ENCOUNTER — Other Ambulatory Visit: Payer: Self-pay

## 2018-10-26 NOTE — Patient Outreach (Signed)
[place]) Care Management  10/26/2018  Renee [name] 07-Sep-1951 [id number]   Referral Date: 10/26/18 Referral Source: MD referral Referral Reason: trouble affording medication   Outreach Attempt: no answer.  Unable to leave a message.   Plan: RN CM will attempt again within 4 business days and send a letter.   Jone Baseman, RN, MSN [facility] Care Management Care Management Coordinator Direct Line [phone] Toll Free: [phone]  Fax: [phone]

## 2018-10-26 NOTE — Telephone Encounter (Signed)
We will do referral to [facility] to help with medication costs. Is she taking her [place] daily? We may need to increase this.

## 2018-10-26 NOTE — Telephone Encounter (Signed)
FYI She is taking tresiba daily.

## 2018-10-30 ENCOUNTER — Other Ambulatory Visit: Payer: Self-pay

## 2018-10-30 ENCOUNTER — Telehealth: Payer: Self-pay | Admitting: *Deleted

## 2018-10-30 MED ORDER — ONETOUCH VERIO FLEX SYSTEM W/DEVICE KIT: PACK | 0 refills | Status: DC

## 2018-10-30 NOTE — Patient Outreach (Signed)
[facility]) Care Management  [place]   10/30/2018  FLORABEL FAULKS 1951/09/14 [id number]   Reason for referral: medication assitance  Referral medication(s):Januvia Current insurance:BCBS  PMHx: hypertension, GERD, hyperlipidemia, type 2 diabetes mellitus, peripheral neuropathy and h/o CVA.   HPI: Ms. Abshier reports that she cannot afford her Januvia.  She states it cost ~$60 while the [place] she reports costs ~$24.99, while insurance paid $606.08 for five pens.  She reports getting Tyler Aas filled once this year on 07/18/18.  She thinks she has enough to get through to the end of the year because she is only using 18 units/day.   She is only requesting help with Januvia assistance.     Objective: Allergies  Allergen Reactions  . Metformin And Related Other (See Comments)    seizures    Medications Reviewed Today    Reviewed by Dionne Milo, [facility] (Pharmacist) on 10/30/18 at [place] List Status: <None>  Medication Order Taking? Sig Documenting Provider Last Dose Status Informant  aspirin 81 MG tablet [id number] Yes Take 81 mg by mouth daily. [provider] Taking Active Self  bisoprolol-hydrochlorothiazide [facility]) 2.5-6.25 MG tablet [id number] Yes TAKE 1 TABLET BY MOUTH ONCE DAILY Sharion Balloon, FNP Taking Active   Blood Glucose Calibration (ONETOUCH VERIO) SOLN [id number] Yes Use as needed to calibrate glucometer Cherre Robins, PharmD Taking Active   Blood Glucose Monitoring Suppl (ONETOUCH VERIO FLEX SYSTEM) w/Device KIT [id number] Yes Use to check blood sugar twice daily Evelina Dun A, FNP Taking Active   Crisaborole (EUCRISA) 2 % OINT [id number] No Apply 1 Applicatorful topically 2 (two) times daily as needed.  Patient not taking:  Reported on 09/14/2018   Sharion Balloon, FNP Not Taking Active   docusate sodium (COLACE) 100 MG capsule [id number] Yes Take 100 mg by mouth daily. [provider] Taking Active Self  fluconazole  (DIFLUCAN) 150 MG tablet [id number] No Take 1 tablet (150 mg total) by mouth every three (3) days as needed.  Patient not taking:  Reported on 10/30/2018   Sharion Balloon, FNP Not Taking Active   gabapentin (NEURONTIN) 300 MG capsule [id number] Yes Take 1 capsule (300 mg total) by mouth 3 (three) times daily. Sharion Balloon, FNP Taking Active   glipiZIDE (GLUCOTROL) 10 MG tablet [id number] Yes TAKE 1 TABLET BY MOUTH ONCE DAILY IN THE MORNING BEFORE BREAKFAST Sharion Balloon, FNP Taking Active   glucose blood (ONETOUCH VERIO) test strip [id number] Yes Use to check blood sugar twice daily Evelina Dun A, FNP Taking Active   insulin degludec (TRESIBA FLEXTOUCH) 100 UNIT/ML SOPN FlexTouch Pen [id number] Yes Inject 0.15 mLs (15 Units total) into the skin daily at 10 pm.  Patient taking differently:  Inject 18 Units into the skin daily at 10 pm.    Sharion Balloon, FNP Taking Active   Multiple Vitamin (MULTIVITAMIN WITH MINERALS) TABS tablet [id number] Yes Take 1 tablet by mouth daily. [provider] Taking Active Self  nystatin (MYCOSTATIN/NYSTOP) powder [id number]  Apply topically 4 (four) times daily. Evelina Dun A, FNP  Active   nystatin cream (MYCOSTATIN) [id number] No Apply 1 application topically 2 (two) times daily.  Patient not taking:  Reported on 10/30/2018   Sharion Balloon, FNP Not Taking Active   omeprazole (PRILOSEC) 20 MG capsule [id number] Yes Take 1 capsule (20 mg total) by mouth daily. Sharion Balloon, FNP Taking Active   [facility] LANCETS 09G MISC [id number] Yes Use to check BG  up to bid.  Dx: E11.65 type 2 DM with insulin therapy, uncontrolled Eckard, Tammy, PharmD Taking Active   simvastatin (ZOCOR) 40 MG tablet [id number] Yes TAKE 1 TABLET (40 MG TOTAL) BY MOUTH AT BEDTIME. Sharion Balloon, FNP Taking Active   sitaGLIPtin (JANUVIA) 100 MG tablet [id number] Yes Take 1 tablet (100 mg total) by mouth daily. Sharion Balloon, FNP Taking Active            Assessment:  Drugs sorted by system:  Cardiovascular: aspirin, bisoprolol/hydrochlorothiazide, simvastatin  Gastrointestinal: omeprazole  Endocrine: glipizide, insulin degludec  Topical: crisaborole  Pain: gabapentin   Infectious Diseases: fluconazole, nystatin powder, nystatin cream  Vitamins/Minerals/Supplements: MVI   Medication Assistance Findings:  Extra Help:   '[]'  Already receiving Full Extra Help  '[]'  Already receiving Partial Extra Help  '[]'  Eligible based on reported income and assets  '[x]'  Not Eligible based on reported income and assets  Patient Assistance Programs: 1) Januvia made by [employer] o Income requirement met: '[x]'  Yes '[]'  No '[]'  Unknown o Out-of-pocket prescription expenditure met:    '[]'  Yes '[]'  No  '[]'  Unknown  '[x]'  Not applicable  Plan: I will route patient assistance letter to [place] technician who will coordinate patient assistance program application process for medications listed above.  [facility] pharmacy technician will assist with obtaining all required documents from both patient and provider(s) and submit application(s) once completed.    Joetta Manners, PharmD Clinical Pharmacist [place] [phone]

## 2018-10-30 NOTE — Patient Outreach (Addendum)
[facility]) Care Management  10/30/2018  Renee [name] October 27, 1951 [id number]   Referral Date: 10/26/18 Referral Source: MD referral Referral Reason: trouble affording medication   Outreach Attempt: no answer.  Unable to leave a message.   Plan: RN CM will attempt again within 4 business days.  Incoming call from patient.  She is able to verify HIPAA.  Discussed reason for referral.  Patient states that her Januvia is $60.00 and she cannot afford that.  Discussed [facility] services with patient.  She is agreeable to pharmacy only at this time.    Patient lives alone but is independent with care.  She does not drive but she states her family takes her to appointments etc.  Patient reports that she tries to follow her diet and stay healthy.  She takes her medications as prescribed but just problems affording her Januvia.    Plan: RN CM will refer to pharmacy for patient assistance.    Jone Baseman, RN, MSN [place] Management Care Management Coordinator Direct Line [phone] Cell [phone] Toll Free: [phone]  Fax: [phone]

## 2018-10-30 NOTE — Telephone Encounter (Signed)
Pt aware Rx for meter sent to [facility]

## 2018-10-31 ENCOUNTER — Other Ambulatory Visit: Payer: Self-pay | Admitting: Pharmacy Technician

## 2018-10-31 NOTE — Patient Outreach (Signed)
[place] [facility]) Care Management  10/31/2018  ITZABELLA SORRELS 06-21-1951 [id number]   Received [employer] patient assistance referral from [place] for [place] for 2020. Prepared patient portion to be mailed, will mail provider portion to Dr. Lenna Gilford mid November.  Will follow up with patient in 5-7 business days to confirm application has been received.  Maud Deed Chana Bode [place] Certified Pharmacy Technician [place] Management Direct Dial:[phone]

## 2018-11-07 ENCOUNTER — Other Ambulatory Visit: Payer: Self-pay | Admitting: Pharmacy Technician

## 2018-11-07 DIAGNOSIS — L84 Corns and callosities: Secondary | ICD-10-CM | POA: Diagnosis not present

## 2018-11-07 DIAGNOSIS — E1151 Type 2 diabetes mellitus with diabetic peripheral angiopathy without gangrene: Secondary | ICD-10-CM | POA: Diagnosis not present

## 2018-11-07 DIAGNOSIS — B351 Tinea unguium: Secondary | ICD-10-CM | POA: Diagnosis not present

## 2018-11-07 DIAGNOSIS — M79676 Pain in unspecified toe(s): Secondary | ICD-10-CM | POA: Diagnosis not present

## 2018-11-07 NOTE — Patient Outreach (Signed)
[facility]) Care Management  11/07/2018  LEAR CARSTENS 11-29-1951 [id number]   Successful outreach, HIPAA identifiers verified. Ms. Erck confirms that she received the [employer] patient assistance application that was mailed to her, she stated that she has already mailed it back. She also states that she understands that the application will not be submitted until December and that I will contact her when she should expect the [place] letter to be mailed to her.  Will follow up with patient in 60-90 days. Confirmed patient has my phone number in case she has any concerns or addidtional questions in the mean time.  Maud Deed Chana Bode [place] Certified Pharmacy Technician [place] Management Direct Dial:[phone]

## 2018-11-11 ENCOUNTER — Emergency Department (HOSPITAL_COMMUNITY)
Admission: EM | Admit: 2018-11-11 | Discharge: 2018-11-11 | Disposition: A | Discharge status: Home / Self Care | Payer: Medicare Other | Attending: Emergency Medicine | Admitting: Emergency Medicine

## 2018-11-11 ENCOUNTER — Encounter (HOSPITAL_COMMUNITY): Payer: Self-pay

## 2018-11-11 DIAGNOSIS — E119 Type 2 diabetes mellitus without complications: Secondary | ICD-10-CM | POA: Insufficient documentation

## 2018-11-11 DIAGNOSIS — Z794 Long term (current) use of insulin: Secondary | ICD-10-CM | POA: Insufficient documentation

## 2018-11-11 DIAGNOSIS — Y929 Unspecified place or not applicable: Secondary | ICD-10-CM | POA: Insufficient documentation

## 2018-11-11 DIAGNOSIS — S56911A Strain of unspecified muscles, fascia and tendons at forearm level, right arm, initial encounter: Secondary | ICD-10-CM | POA: Diagnosis not present

## 2018-11-11 DIAGNOSIS — Y939 Activity, unspecified: Secondary | ICD-10-CM | POA: Diagnosis not present

## 2018-11-11 DIAGNOSIS — I10 Essential (primary) hypertension: Secondary | ICD-10-CM | POA: Insufficient documentation

## 2018-11-11 DIAGNOSIS — Z7982 Long term (current) use of aspirin: Secondary | ICD-10-CM | POA: Insufficient documentation

## 2018-11-11 DIAGNOSIS — Y999 Unspecified external cause status: Secondary | ICD-10-CM | POA: Insufficient documentation

## 2018-11-11 DIAGNOSIS — S59912A Unspecified injury of left forearm, initial encounter: Secondary | ICD-10-CM | POA: Diagnosis present

## 2018-11-11 DIAGNOSIS — X58XXXA Exposure to other specified factors, initial encounter: Secondary | ICD-10-CM | POA: Insufficient documentation

## 2018-11-11 DIAGNOSIS — Z79899 Other long term (current) drug therapy: Secondary | ICD-10-CM | POA: Insufficient documentation

## 2018-11-11 DIAGNOSIS — S56912A Strain of unspecified muscles, fascia and tendons at forearm level, left arm, initial encounter: Secondary | ICD-10-CM | POA: Diagnosis not present

## 2018-11-11 NOTE — Discharge Instructions (Addendum)
The discomfort in your left forearm is likely a muscle strain.  We are giving her a sling to use to help at rest.  Use the sling for several days.  Also, use heat on the sore area 3 or 4 times a day.  For pain use Tylenol every 4 hours or Motrin every 6 hours.  Return here, if needed for problems.  Your blood pressure was mildly elevated today.  Make sure that you take your medicine as usual.  Ask your doctor to check your blood pressure the next time you see her.

## 2018-11-11 NOTE — ED Triage Notes (Signed)
Pt reports that left lower arm began hurting last night. Swelling noted this morning. No injury

## 2018-11-11 NOTE — ED Provider Notes (Signed)
[facility] EMERGENCY DEPARTMENT Provider Note   CSN: [id number] Arrival date & time: 11/11/18  0930     History   Chief Complaint Chief Complaint  Patient presents with  . Arm Pain    HPI Renee [name] is a 67 y.o. female.  HPI  The patient noticed pain in her left forearm with swelling, yesterday evening.  The pain persisted today so she decided to come here for a checkup.  She is right-handed.  She denies trauma.  She does not currently work.  No similar problems in the past.  She denies fever, chills, neck pain, left upper arm pain, nausea or vomiting.  There are no other known modifying factors.   Past Medical History:  Diagnosis Date  . Arthritis   . Breast cancer ([place]) 2001   right mastectomy  . CVA (cerebral infarction) 2003, 2005  . DM (diabetes mellitus) ([place])   . GERD (gastroesophageal reflux disease)   . Heart murmur   . Hyperlipidemia   . Hypertension   . Seizures ([place])   . Vitamin D deficiency     Patient Active Problem List   Diagnosis Date Noted  . Overweight (BMI 25.0-29.9) 01/03/2017  . Constipation 01/03/2017  . Diabetic peripheral neuropathy ([place]) 09/30/2016  . Uncontrolled type 2 diabetes mellitus, without long-term current use of insulin ([place]) 07/05/2016  . Genetic testing 05/09/2016  . History of breast cancer in female 04/02/2016  . Family history of breast cancer in sister 04/02/2016  . GERD (gastroesophageal reflux disease) 02/05/2014  . Hyperlipidemia associated with type 2 diabetes mellitus ([place]) 02/05/2014  . Hypertension associated with diabetes ([place]) 02/05/2014  . History of CVA (cerebrovascular accident) 02/05/2014    Past Surgical History:  Procedure Laterality Date  . ABDOMINAL HYSTERECTOMY  2000  . GANGLION CYST EXCISION  2010   right (2010)  and left (before 2010) hand  . MASTECTOMY  2001   right  . TONSILECTOMY/ADENOIDECTOMY WITH MYRINGOTOMY Bilateral    age 55's     OB History   None      Home Medications     Prior to Admission medications   Medication Sig Start Date End Date Taking? Authorizing Provider  aspirin 81 MG tablet Take 81 mg by mouth daily.   Yes [provider]  bisoprolol-hydrochlorothiazide [facility]) 2.5-6.25 MG tablet TAKE 1 TABLET BY MOUTH ONCE DAILY 07/24/18  Yes Hawks, Christy A, FNP  Blood Glucose Calibration (ONETOUCH VERIO) SOLN Use as needed to calibrate glucometer 01/05/16  Yes Eckard, Tammy, PharmD  Blood Glucose Monitoring Suppl ([place]) w/Device KIT Use to check blood sugar twice daily 10/30/18  Yes Hawks, Christy A, FNP  docusate sodium (COLACE) 100 MG capsule Take 100 mg by mouth daily.   Yes [provider]  gabapentin (NEURONTIN) 300 MG capsule Take 1 capsule (300 mg total) by mouth 3 (three) times daily. 07/18/18  Yes Hawks, Christy A, FNP  glipiZIDE (GLUCOTROL) 10 MG tablet TAKE 1 TABLET BY MOUTH ONCE DAILY IN THE MORNING BEFORE BREAKFAST 07/18/18  Yes Hawks, Christy A, FNP  glucose blood (ONETOUCH VERIO) test strip Use to check blood sugar twice daily 09/07/18  Yes Hawks, Christy A, FNP  insulin degludec (TRESIBA FLEXTOUCH) 100 UNIT/ML SOPN FlexTouch Pen Inject 0.15 mLs (15 Units total) into the skin daily at 10 pm. Patient taking differently: Inject 18 Units into the skin daily at 10 pm.  07/20/18  Yes Evelina Dun A, FNP  Multiple Vitamin (MULTIVITAMIN WITH MINERALS) TABS tablet Take  1 tablet by mouth daily.   Yes [provider]  omeprazole (PRILOSEC) 20 MG capsule Take 1 capsule (20 mg total) by mouth daily. 07/18/18  Yes Hawks, Christy A, FNP  ONETOUCH DELICA LANCETS 54S MISC Use to check BG up to bid.  Dx: E11.65 type 2 DM with insulin therapy, uncontrolled 01/05/16  Yes Eckard, Tammy, PharmD  simvastatin (ZOCOR) 40 MG tablet TAKE 1 TABLET (40 MG TOTAL) BY MOUTH AT BEDTIME. 07/18/18  Yes Hawks, Christy A, FNP  fluconazole (DIFLUCAN) 150 MG tablet Take 1 tablet (150 mg total) by mouth every three (3) days as needed. Patient not  taking: Reported on 10/30/2018 09/14/18   Evelina Dun A, FNP  nystatin (MYCOSTATIN/NYSTOP) powder Apply topically 4 (four) times daily. Patient not taking: Reported on 11/11/2018 09/14/18   Evelina Dun A, FNP  nystatin cream (MYCOSTATIN) Apply 1 application topically 2 (two) times daily. Patient not taking: Reported on 10/30/2018 09/14/18   Evelina Dun A, FNP  sitaGLIPtin (JANUVIA) 100 MG tablet Take 1 tablet (100 mg total) by mouth daily. Patient not taking: Reported on 11/11/2018 07/18/18   Sharion Balloon, FNP    Family History Family History  Problem Relation Age of Onset  . Alzheimer's disease Mother   . Parkinson's disease Mother   . Heart Problems Father   . Kidney disease Father        +dialysis  . Heart disease Father   . Other Sister        +TAH for fibroids  . Cancer Paternal Aunt        NOS cancer  . Breast cancer Sister        dx. 27-56  . Renal cancer Sister        dx. 62-63; not a smoker  . Early death Brother   . Diabetes Brother   . Colon cancer Neg Hx   . Stomach cancer Neg Hx     Social History Social History   Tobacco Use  . Smoking status: Never Smoker  . Smokeless tobacco: Never Used  Substance Use Topics  . Alcohol use: Not Currently    Alcohol/week: 1.0 standard drinks    Types: 1 Glasses of wine per week  . Drug use: No     Allergies   Metformin and related   Review of Systems Review of Systems  All other systems reviewed and are negative.    Physical Exam Updated Vital Signs BP (!) 176/93 (BP Location: Right Arm)   Pulse 71   Temp 97.8 F (36.6 C) (Oral)   Resp 16   Wt 81.2 kg   SpO2 100%   BMI 28.89 kg/m   Physical Exam  Constitutional: She is oriented to person, place, and time. She appears well-developed and well-nourished. No distress.  HENT:  Head: Normocephalic and atraumatic.  Eyes: Pupils are equal, round, and reactive to light. Conjunctivae and EOM are normal.  Neck: Normal range of motion and phonation  normal. Neck supple.  Cardiovascular: Normal rate and regular rhythm.  Intact left brachial, and left radial pulses, normal pulsations.  Pulmonary/Chest: Effort normal.  Musculoskeletal: Normal range of motion.  Mild tenderness without significant swelling of the left proximal forearm, over the proximal brachioradialis muscle bundle.  There is no deformity or significant swelling at this site.  Neurological: She is alert and oriented to person, place, and time. She exhibits normal muscle tone. Coordination normal.  Normal sensation, fingers of left hand.  Normal strength left arm.  Skin: Skin is  warm and dry.  Psychiatric: She has a normal mood and affect. Her behavior is normal. Judgment and thought content normal.  Nursing note and vitals reviewed.    ED Treatments / Results  Labs (all labs ordered are listed, but only abnormal results are displayed) Labs Reviewed - No data to display  EKG None  Radiology No results found.  Procedures Procedures (including critical care time)  Medications Ordered in ED Medications - No data to display   Initial Impression / Assessment and Plan / ED Course  I have reviewed the triage vital signs and the nursing notes.  Pertinent labs & imaging results that were available during my care of the patient were reviewed by me and considered in my medical decision making (see chart for details).      Patient Vitals for the past 24 hrs:  BP Temp Temp src Pulse Resp SpO2 Weight  11/11/18 0938 (!) 176/93 97.8 F (36.6 C) Oral 71 16 100 % 81.2 kg    10:05 AM Reevaluation with update and discussion. After initial assessment and treatment, an updated evaluation reveals no change in clinical status.  Findings discussed with the patient and all questions were answered. Daleen Bo   Medical Decision Making: Nonspecific muscle strain, without evidence for tendinopathy.  Doubt brachial plexopathy.  Doubt fracture.  Doubt cervical  radiculopathy.  CRITICAL CARE-no  Nursing Notes Reviewed/ Care Coordinated Applicable Imaging Reviewed Interpretation of Laboratory Data incorporated into ED treatment  The patient appears reasonably screened and/or stabilized for discharge and I doubt any other medical condition or other [facility] requiring further screening, evaluation, or treatment in the ED at this time prior to discharge.  Plan: Home Medications-OTC analgesic of choice, continue routine medications; Home Treatments-symptomatic treatment for left arm discomfort with arm sling and heat applications; return here if the recommended treatment, does not improve the symptoms; Recommended follow up-PCP checkup 1 week and as needed.    Final Clinical Impressions(s) / ED Diagnoses   Final diagnoses:  Muscle strain of left forearm, initial encounter    ED Discharge Orders    None       Daleen Bo, MD 11/11/18 1006

## 2018-11-20 ENCOUNTER — Encounter: Payer: Self-pay | Admitting: Family

## 2018-11-20 ENCOUNTER — Ambulatory Visit (INDEPENDENT_AMBULATORY_CARE_PROVIDER_SITE_OTHER): Payer: Medicare Other | Admitting: Family

## 2018-11-20 VITALS — BP 125/71 | HR 67 | Temp 96.8°F | Ht 66.0 in | Wt 175.0 lb

## 2018-11-20 DIAGNOSIS — E1142 Type 2 diabetes mellitus with diabetic polyneuropathy: Secondary | ICD-10-CM

## 2018-11-20 DIAGNOSIS — E785 Hyperlipidemia, unspecified: Secondary | ICD-10-CM

## 2018-11-20 DIAGNOSIS — E663 Overweight: Secondary | ICD-10-CM

## 2018-11-20 DIAGNOSIS — M79632 Pain in left forearm: Secondary | ICD-10-CM | POA: Diagnosis not present

## 2018-11-20 DIAGNOSIS — E1165 Type 2 diabetes mellitus with hyperglycemia: Secondary | ICD-10-CM | POA: Diagnosis not present

## 2018-11-20 DIAGNOSIS — E1159 Type 2 diabetes mellitus with other circulatory complications: Secondary | ICD-10-CM

## 2018-11-20 DIAGNOSIS — Z8673 Personal history of transient ischemic attack (TIA), and cerebral infarction without residual deficits: Secondary | ICD-10-CM

## 2018-11-20 DIAGNOSIS — I10 Essential (primary) hypertension: Secondary | ICD-10-CM | POA: Diagnosis not present

## 2018-11-20 DIAGNOSIS — K2100 Gastro-esophageal reflux disease with esophagitis, without bleeding: Secondary | ICD-10-CM

## 2018-11-20 DIAGNOSIS — E1169 Type 2 diabetes mellitus with other specified complication: Secondary | ICD-10-CM | POA: Diagnosis not present

## 2018-11-20 DIAGNOSIS — IMO0002 Reserved for concepts with insufficient information to code with codable children: Secondary | ICD-10-CM

## 2018-11-20 LAB — BAYER DCA HB A1C WAIVED: HB A1C (BAYER DCA - WAIVED): 8.7 % — ABNORMAL HIGH (ref <7.0)

## 2018-11-20 MED ORDER — GABAPENTIN 400 MG PO CAPS: 400.0000 mg | ORAL_CAPSULE | Freq: Three times a day (TID) | ORAL | 1 refills | Status: DC

## 2018-11-20 NOTE — Patient Instructions (Signed)
Diabetes Mellitus and Nutrition When you have diabetes (diabetes mellitus), it is very important to have healthy eating habits because your blood sugar (glucose) levels are greatly affected by what you eat and drink. Eating healthy foods in the appropriate amounts, at about the same times every day, can help you:  Control your blood glucose.  Lower your risk of heart disease.  Improve your blood pressure.  Reach or maintain a healthy weight.  Every person with diabetes is different, and each person has different needs for a meal plan. Your health care provider may recommend that you work with a diet and nutrition specialist (dietitian) to make a meal plan that is best for you. Your meal plan may vary depending on factors such as:  The calories you need.  The medicines you take.  Your weight.  Your blood glucose, blood pressure, and cholesterol levels.  Your activity level.  Other health conditions you have, such as heart or kidney disease.  How do carbohydrates affect me? Carbohydrates affect your blood glucose level more than any other type of food. Eating carbohydrates naturally increases the amount of glucose in your blood. Carbohydrate counting is a method for keeping track of how many carbohydrates you eat. Counting carbohydrates is important to keep your blood glucose at a healthy level, especially if you use insulin or take certain oral diabetes medicines. It is important to know how many carbohydrates you can safely have in each meal. This is different for every person. Your dietitian can help you calculate how many carbohydrates you should have at each meal and for snack. Foods that contain carbohydrates include:  Bread, cereal, rice, pasta, and crackers.  Potatoes and corn.  Peas, beans, and lentils.  Milk and yogurt.  Fruit and juice.  Desserts, such as cakes, cookies, ice cream, and candy.  How does alcohol affect me? Alcohol can cause a sudden decrease in blood  glucose (hypoglycemia), especially if you use insulin or take certain oral diabetes medicines. Hypoglycemia can be a life-threatening condition. Symptoms of hypoglycemia (sleepiness, dizziness, and confusion) are similar to symptoms of having too much alcohol. If your health care provider says that alcohol is safe for you, follow these guidelines:  Limit alcohol intake to no more than 1 drink per day for nonpregnant women and 2 drinks per day for men. One drink equals 12 oz of beer, 5 oz of wine, or 1 oz of hard liquor.  Do not drink on an empty stomach.  Keep yourself hydrated with water, diet soda, or unsweetened iced tea.  Keep in mind that regular soda, juice, and other mixers may contain a lot of sugar and must be counted as carbohydrates.  What are tips for following this plan? Reading food labels  Start by checking the serving size on the label. The amount of calories, carbohydrates, fats, and other nutrients listed on the label are based on one serving of the food. Many foods contain more than one serving per package.  Check the total grams (g) of carbohydrates in one serving. You can calculate the number of servings of carbohydrates in one serving by dividing the total carbohydrates by 15. For example, if a food has 30 g of total carbohydrates, it would be equal to 2 servings of carbohydrates.  Check the number of grams (g) of saturated and trans fats in one serving. Choose foods that have low or no amount of these fats.  Check the number of milligrams (mg) of sodium in one serving. Most people   should limit total sodium intake to less than 2,300 mg per day.  Always check the nutrition information of foods labeled as "low-fat" or "nonfat". These foods may be higher in added sugar or refined carbohydrates and should be avoided.  Talk to your dietitian to identify your daily goals for nutrients listed on the label. Shopping  Avoid buying canned, premade, or processed foods. These  foods tend to be high in fat, sodium, and added sugar.  Shop around the outside edge of the grocery store. This includes fresh fruits and vegetables, bulk grains, fresh meats, and fresh dairy. Cooking  Use low-heat cooking methods, such as baking, instead of high-heat cooking methods like deep frying.  Cook using healthy oils, such as olive, canola, or sunflower oil.  Avoid cooking with butter, cream, or high-fat meats. Meal planning  Eat meals and snacks regularly, preferably at the same times every day. Avoid going long periods of time without eating.  Eat foods high in fiber, such as fresh fruits, vegetables, beans, and whole grains. Talk to your dietitian about how many servings of carbohydrates you can eat at each meal.  Eat 4-6 ounces of lean protein each day, such as lean meat, chicken, fish, eggs, or tofu. 1 ounce is equal to 1 ounce of meat, chicken, or fish, 1 egg, or 1/4 cup of tofu.  Eat some foods each day that contain healthy fats, such as avocado, nuts, seeds, and fish. Lifestyle   Check your blood glucose regularly.  Exercise at least 30 minutes 5 or more days each week, or as told by your health care provider.  Take medicines as told by your health care provider.  Do not use any products that contain nicotine or tobacco, such as cigarettes and e-cigarettes. If you need help quitting, ask your health care provider.  Work with a counselor or diabetes educator to identify strategies to manage stress and any emotional and social challenges. What are some questions to ask my health care provider?  Do I need to meet with a diabetes educator?  Do I need to meet with a dietitian?  What number can I call if I have questions?  When are the best times to check my blood glucose? Where to find more information:  American Diabetes Association: diabetes.org/food-and-fitness/food  Academy of Nutrition and Dietetics:  www.eatright.org/resources/health/diseases-and-conditions/diabetes  National Institute of Diabetes and Digestive and Kidney Diseases (NIH): www.niddk.nih.gov/health-information/diabetes/overview/diet-eating-physical-activity Summary  A healthy meal plan will help you control your blood glucose and maintain a healthy lifestyle.  Working with a diet and nutrition specialist (dietitian) can help you make a meal plan that is best for you.  Keep in mind that carbohydrates and alcohol have immediate effects on your blood glucose levels. It is important to count carbohydrates and to use alcohol carefully. This information is not intended to replace advice given to you by your health care provider. Make sure you discuss any questions you have with your health care provider. Document Released: 09/09/2005 Document Revised: 01/17/2017 Document Reviewed: 01/17/2017 Elsevier Interactive Patient Education  2018 Elsevier Inc.  

## 2018-11-20 NOTE — Progress Notes (Signed)
Subjective:    Patient ID: Renee [name], female    DOB: November 08, 1951, 67 y.o.   MRN: [id number]  Chief Complaint  Patient presents with  . Medical Management of Chronic Issues   PT presents to the office today for chronic follow up. She went to the ED on 11/11/18 with left forearm pain and swelling. She was diagnosed with muscle strain of forearm. She has taken tylenol and ice with mild relief.  Diabetes  She presents for her follow-up diabetic visit. She has type 2 diabetes mellitus. Her disease course has been worsening. There are no hypoglycemic associated symptoms. Associated symptoms include foot paresthesias. Pertinent negatives for diabetes include no blurred vision and no visual change. Symptoms are stable. Diabetic complications include peripheral neuropathy. Pertinent negatives for diabetic complications include no CVA or heart disease. Risk factors for coronary artery disease include diabetes mellitus, dyslipidemia, hypertension and stress. She is following a generally healthy diet. Her overall blood glucose range is 140-180 mg/dl. Eye exam is current.  Hypertension  This is a chronic problem. The current episode started more than 1 year ago. The problem has been waxing and waning since onset. The problem is uncontrolled. Pertinent negatives include no blurred vision, peripheral edema or shortness of breath. Risk factors for coronary artery disease include dyslipidemia, diabetes mellitus, obesity and sedentary lifestyle. The current treatment provides mild improvement. There is no history of CVA.  Hyperlipidemia  This is a chronic problem. The current episode started more than 1 year ago. The problem is controlled. Recent lipid tests were reviewed and are normal. Pertinent negatives include no shortness of breath. Current antihyperlipidemic treatment includes statins. The current treatment provides moderate improvement of lipids. Risk factors for coronary artery disease include diabetes  mellitus, dyslipidemia, hypertension and post-menopausal.  Diabetic Neuropathy  PT states she has intermittent aching, burning pain of 9 out 10. Gabapentin 300 mg TID with mild relief.     Review of Systems  Eyes: Negative for blurred vision.  Respiratory: Negative for shortness of breath.   Musculoskeletal:       Left arm forearm  All other systems reviewed and are negative.      Objective:   Physical Exam  Constitutional: She is oriented to person, place, and time. She appears well-developed and well-nourished. No distress.  HENT:  Head: Normocephalic and atraumatic.  Right Ear: External ear normal.  Mouth/Throat: Oropharynx is clear and moist.  Eyes: Pupils are equal, round, and reactive to light.  Neck: Normal range of motion. Neck supple. No thyromegaly present.  Cardiovascular: Normal rate, regular rhythm, normal heart sounds and intact distal pulses.  No murmur heard. Pulmonary/Chest: Effort normal and breath sounds normal. No respiratory distress. She has no wheezes.  Abdominal: Soft. Bowel sounds are normal. She exhibits no distension. There is no tenderness.  Musculoskeletal: Normal range of motion. She exhibits tenderness. She exhibits no edema.  Small cyst-like nodule on left forearm. Tenderness with palpation.   Neurological: She is alert and oriented to person, place, and time. She has normal reflexes. No cranial nerve deficit.  Skin: Skin is warm and dry.     Psychiatric: She has a normal mood and affect. Her behavior is normal. Judgment and thought content normal.  Vitals reviewed.    BP (!) 147/90   Pulse 67   Temp (!) 96.8 F (36 C) (Oral)   Ht 5' 6" (1.676 m)   Wt 175 lb (79.4 kg)   BMI 28.25 kg/m  Assessment & Plan:  Isolde C Byus comes in today with chief complaint of Medical Management of Chronic Issues   Diagnosis and orders addressed:  1. Hypertension associated with diabetes (HCC) - CMP14+EGFR - CBC with  Differential/Platelet  2. Gastroesophageal reflux disease with esophagitis - CMP14+EGFR - CBC with Differential/Platelet  3. Hyperlipidemia associated with type 2 diabetes mellitus (HCC) - CMP14+EGFR - CBC with Differential/Platelet  4. Uncontrolled type 2 diabetes mellitus, without long-term current use of insulin (HCC) - Bayer DCA Hb A1c Waived - CMP14+EGFR - CBC with Differential/Platelet  5. Diabetic peripheral neuropathy (HCC) We will increase gabapentin to 400 mg TID form 300 mg TID - gabapentin (NEURONTIN) 400 MG capsule; Take 1 capsule (400 mg total) by mouth 3 (three) times daily.  Dispense: 90 capsule; Refill: 1 - CMP14+EGFR - CBC with Differential/Platelet  6. History of CVA (cerebrovascular accident) - CMP14+EGFR - CBC with Differential/Platelet  7. Overweight (BMI 25.0-29.9) - CMP14+EGFR - CBC with Differential/Platelet  8. Left forearm pain I believe this is a cyst Start motrin TID prn - CMP14+EGFR - CBC with Differential/Platelet   Labs pending Health Maintenance reviewed Diet and exercise encouraged  Follow up plan: 1 month recheck on DM and arm pain   Christy Hawks, FNP  

## 2018-11-21 ENCOUNTER — Other Ambulatory Visit: Payer: Self-pay | Admitting: Family

## 2018-11-21 ENCOUNTER — Telehealth: Payer: Self-pay

## 2018-11-21 LAB — CMP14+EGFR
ALK PHOS: 84 IU/L (ref 39–117)
ALT: 23 IU/L (ref 0–32)
AST: 27 IU/L (ref 0–40)
Albumin/Globulin Ratio: 1.4 (ref 1.2–2.2)
Albumin: 4.4 g/dL (ref 3.6–4.8)
BUN/Creatinine Ratio: 15 (ref 12–28)
BUN: 15 mg/dL (ref 8–27)
Bilirubin Total: 0.4 mg/dL (ref 0.0–1.2)
CALCIUM: 10.1 mg/dL (ref 8.7–10.3)
CO2: 24 mmol/L (ref 20–29)
CREATININE: 1.03 mg/dL — AB (ref 0.57–1.00)
Chloride: 103 mmol/L (ref 96–106)
GFR calc Af Amer: 65 mL/min/{1.73_m2} (ref >59)
GFR, EST NON AFRICAN AMERICAN: 56 mL/min/{1.73_m2} — AB (ref >59)
Globulin, Total: 3.1 g/dL (ref 1.5–4.5)
Glucose: 123 mg/dL — ABNORMAL HIGH (ref 65–99)
Potassium: 4.8 mmol/L (ref 3.5–5.2)
Sodium: 142 mmol/L (ref 134–144)
Total Protein: 7.5 g/dL (ref 6.0–8.5)

## 2018-11-21 LAB — CBC WITH DIFFERENTIAL/PLATELET
BASOS: 1 %
Basophils Absolute: 0.1 10*3/uL (ref 0.0–0.2)
EOS (ABSOLUTE): 0.1 10*3/uL (ref 0.0–0.4)
EOS: 1 %
HEMATOCRIT: 40.3 % (ref 34.0–46.6)
HEMOGLOBIN: 13.4 g/dL (ref 11.1–15.9)
IMMATURE GRANS (ABS): 0 10*3/uL (ref 0.0–0.1)
IMMATURE GRANULOCYTES: 0 %
LYMPHS: 33 %
Lymphocytes Absolute: 2.2 10*3/uL (ref 0.7–3.1)
MCH: 29.1 pg (ref 26.6–33.0)
MCHC: 33.3 g/dL (ref 31.5–35.7)
MCV: 88 fL (ref 79–97)
Monocytes Absolute: 0.6 10*3/uL (ref 0.1–0.9)
Monocytes: 8 %
NEUTROS PCT: 57 %
Neutrophils Absolute: 3.6 10*3/uL (ref 1.4–7.0)
Platelets: 348 10*3/uL (ref 150–450)
RBC: 4.6 x10E6/uL (ref 3.77–5.28)
RDW: 12.4 % (ref 12.3–15.4)
WBC: 6.5 10*3/uL (ref 3.4–10.8)

## 2018-11-21 MED ORDER — DULAGLUTIDE 0.75 MG/0.5ML ~~LOC~~ SOAJ: 0.7500 mg | SUBCUTANEOUS | 3 refills | Status: DC

## 2018-11-21 NOTE — Addendum Note (Signed)
Addended by: Nigel Berthold C on: 11/21/2018 02:55 PM   Modules accepted: Orders

## 2018-11-21 NOTE — Telephone Encounter (Signed)
Patient states that the Trulicity is $494- please send in something else.

## 2018-11-24 NOTE — Telephone Encounter (Signed)
Is she taking her [place] daily? If so we will increase this to 23 units from 18 units.

## 2018-11-24 NOTE — Telephone Encounter (Signed)
Patient states she is taking [place] regularly, she has missed 2-3 doses.  Advised her per Evelina Dun, FNP to increase from 18 units to 23 units of [place].  Patient agreeable.

## 2018-12-11 ENCOUNTER — Other Ambulatory Visit: Payer: Self-pay | Admitting: Pharmacy Technician

## 2018-12-11 NOTE — Patient Outreach (Addendum)
[facility]) Care Management  12/11/2018  Renee [name] [birth year] [id number]    Follow up call placed to [employer] regarding patient assistance application, Abran Richard confirmed application has benn received and stated that attestatiom form was mailed out to patient on 12/12.    Unsuccessful call #1 placed to patient regarding patient assistance update for [employer], HIPAA compliant voicemail left. Contacted patient to inform attestation form has been mailed to her from [employer].  Will make 2nd call in 2-3 business days if call has not been returned.  Maud Deed Chana Bode [place] Certified Pharmacy Technician [place] Management Direct Dial:[phone]    ADDENDUM 4:04pm  Incoming call from patient, HIPAA identifiers verified. Informed patient of Designer, jewellery form and requested that patient contact once form has been received. Patient stated understanding.  Will follow up with patient in 4-5 business days if call has not been returned.  Maud Deed Chana Bode [place] Certified Pharmacy Technician [place] Management Direct Dial:[phone]

## 2018-12-13 ENCOUNTER — Ambulatory Visit: Payer: BC Managed Care – PPO | Admitting: Pharmacy Technician

## 2018-12-15 ENCOUNTER — Ambulatory Visit: Payer: Self-pay | Admitting: Pharmacy Technician

## 2018-12-21 ENCOUNTER — Telehealth: Payer: Self-pay | Admitting: Family

## 2018-12-21 ENCOUNTER — Encounter: Payer: Self-pay | Admitting: Family

## 2018-12-21 ENCOUNTER — Ambulatory Visit (INDEPENDENT_AMBULATORY_CARE_PROVIDER_SITE_OTHER): Payer: Medicare Other | Admitting: Family

## 2018-12-21 VITALS — BP 129/74 | HR 76 | Temp 97.2°F | Ht 66.0 in | Wt 177.6 lb

## 2018-12-21 DIAGNOSIS — D367 Benign neoplasm of other specified sites: Secondary | ICD-10-CM

## 2018-12-21 DIAGNOSIS — E1165 Type 2 diabetes mellitus with hyperglycemia: Secondary | ICD-10-CM | POA: Diagnosis not present

## 2018-12-21 DIAGNOSIS — E1159 Type 2 diabetes mellitus with other circulatory complications: Secondary | ICD-10-CM | POA: Diagnosis not present

## 2018-12-21 DIAGNOSIS — E663 Overweight: Secondary | ICD-10-CM | POA: Diagnosis not present

## 2018-12-21 DIAGNOSIS — I10 Essential (primary) hypertension: Secondary | ICD-10-CM | POA: Diagnosis not present

## 2018-12-21 DIAGNOSIS — IMO0002 Reserved for concepts with insufficient information to code with codable children: Secondary | ICD-10-CM

## 2018-12-21 LAB — BAYER DCA HB A1C WAIVED: HB A1C: 8.8 % — AB (ref <7.0)

## 2018-12-21 NOTE — Telephone Encounter (Signed)
Ok to do Handicap form?

## 2018-12-21 NOTE — Progress Notes (Signed)
Subjective:    Patient ID: GER NICKS, female    DOB: March 07, 1951, 67 y.o.   MRN: [id number]  Chief Complaint  Patient presents with  . Hypertension    one month recheck  . Arm Pain    Hypertension  This is a chronic problem. The current episode started more than 1 year ago. The problem has been resolved since onset. The problem is controlled. Pertinent negatives include no blurred vision, headaches, malaise/fatigue, peripheral edema or shortness of breath. Risk factors for coronary artery disease include dyslipidemia, diabetes mellitus and obesity. The current treatment provides moderate improvement. Hypertensive end-organ damage includes CAD/MI and CVA. There is no history of heart failure.  Diabetes  She presents for her follow-up diabetic visit. She has type 2 diabetes mellitus. Her disease course has been worsening. There are no hypoglycemic associated symptoms. Pertinent negatives for hypoglycemia include no headaches. Pertinent negatives for diabetes include no blurred vision, no foot paresthesias and no visual change. There are no hypoglycemic complications. Symptoms are stable. Diabetic complications include a CVA and heart disease. Risk factors for coronary artery disease include dyslipidemia, diabetes mellitus and hypertension. She is following a generally healthy diet.  Cyst on Arm PT states she no longer can feel the "bump" and denies any pain.    Review of Systems  Constitutional: Negative for malaise/fatigue.  Eyes: Negative for blurred vision.  Respiratory: Negative for shortness of breath.   Neurological: Negative for headaches.  All other systems reviewed and are negative.      Objective:   Physical Exam Vitals signs reviewed.  Constitutional:      General: She is not in acute distress.    Appearance: She is well-developed.  HENT:     Head: Normocephalic and atraumatic.  Eyes:     Pupils: Pupils are equal, round, and reactive to light.  Neck:   Musculoskeletal: Normal range of motion and neck supple.     Thyroid: No thyromegaly.  Cardiovascular:     Rate and Rhythm: Normal rate and regular rhythm.     Heart sounds: Normal heart sounds. No murmur.  Pulmonary:     Effort: Pulmonary effort is normal. No respiratory distress.     Breath sounds: Normal breath sounds. No wheezing.  Abdominal:     General: Bowel sounds are normal. There is no distension.     Palpations: Abdomen is soft.     Tenderness: There is no abdominal tenderness.  Musculoskeletal: Normal range of motion.        General: No tenderness.  Skin:    General: Skin is warm and dry.  Neurological:     Mental Status: She is alert and oriented to person, place, and time.     Cranial Nerves: No cranial nerve deficit.     Deep Tendon Reflexes: Reflexes are normal and symmetric.  Psychiatric:        Behavior: Behavior normal.        Thought Content: Thought content normal.        Judgment: Judgment normal.       BP 129/74   Pulse 76   Temp (!) 97.2 F (36.2 C) (Oral)   Ht '5\' 6"'  (1.676 m)   Wt 177 lb 9.6 oz (80.6 kg)   BMI 28.67 kg/m      Assessment & Plan:  LASHALA LASER comes in today with chief complaint of Hypertension (one month recheck) and Arm Pain   Diagnosis and orders addressed:  1. Hypertension associated with diabetes (  [place]) - CMP14+EGFR  2. Overweight (BMI 25.0-29.9) - CMP14+EGFR  3. Uncontrolled type 2 diabetes mellitus, without long-term current use of insulin ([facility]) - Bayer DCA Hb A1c Waived - CMP14+EGFR  4. Cyst, dermoid, arm, left - CMP14+EGFR   Labs pending Continue all medications and strict low carb diet Health Maintenance reviewed Diet and exercise encouraged  Follow up plan: 3 months    Evelina Dun, FNP

## 2018-12-21 NOTE — Patient Instructions (Signed)
Diabetes Mellitus and Nutrition, Adult  When you have diabetes (diabetes mellitus), it is very important to have healthy eating habits because your blood sugar (glucose) levels are greatly affected by what you eat and drink. Eating healthy foods in the appropriate amounts, at about the same times every day, can help you:  · Control your blood glucose.  · Lower your risk of heart disease.  · Improve your blood pressure.  · Reach or maintain a healthy weight.  Every person with diabetes is different, and each person has different needs for a meal plan. Your health care provider may recommend that you work with a diet and nutrition specialist (dietitian) to make a meal plan that is best for you. Your meal plan may vary depending on factors such as:  · The calories you need.  · The medicines you take.  · Your weight.  · Your blood glucose, blood pressure, and cholesterol levels.  · Your activity level.  · Other health conditions you have, such as heart or kidney disease.  How do carbohydrates affect me?  Carbohydrates, also called carbs, affect your blood glucose level more than any other type of food. Eating carbs naturally raises the amount of glucose in your blood. Carb counting is a method for keeping track of how many carbs you eat. Counting carbs is important to keep your blood glucose at a healthy level, especially if you use insulin or take certain oral diabetes medicines.  It is important to know how many carbs you can safely have in each meal. This is different for every person. Your dietitian can help you calculate how many carbs you should have at each meal and for each snack.  Foods that contain carbs include:  · Bread, cereal, rice, pasta, and crackers.  · Potatoes and corn.  · Peas, beans, and lentils.  · Milk and yogurt.  · Fruit and juice.  · Desserts, such as cakes, cookies, ice cream, and candy.  How does alcohol affect me?  Alcohol can cause a sudden decrease in blood glucose (hypoglycemia),  especially if you use insulin or take certain oral diabetes medicines. Hypoglycemia can be a life-threatening condition. Symptoms of hypoglycemia (sleepiness, dizziness, and confusion) are similar to symptoms of having too much alcohol.  If your health care provider says that alcohol is safe for you, follow these guidelines:  · Limit alcohol intake to no more than 1 drink per day for nonpregnant women and 2 drinks per day for men. One drink equals 12 oz of beer, 5 oz of wine, or 1½ oz of hard liquor.  · Do not drink on an empty stomach.  · Keep yourself hydrated with water, diet soda, or unsweetened iced tea.  · Keep in mind that regular soda, juice, and other mixers may contain a lot of sugar and must be counted as carbs.  What are tips for following this plan?    Reading food labels  · Start by checking the serving size on the "Nutrition Facts" label of packaged foods and drinks. The amount of calories, carbs, fats, and other nutrients listed on the label is based on one serving of the item. Many items contain more than one serving per package.  · Check the total grams (g) of carbs in one serving. You can calculate the number of servings of carbs in one serving by dividing the total carbs by 15. For example, if a food has 30 g of total carbs, it would be equal to 2   servings of carbs.  · Check the number of grams (g) of saturated and trans fats in one serving. Choose foods that have low or no amount of these fats.  · Check the number of milligrams (mg) of salt (sodium) in one serving. Most people should limit total sodium intake to less than 2,300 mg per day.  · Always check the nutrition information of foods labeled as "low-fat" or "nonfat". These foods may be higher in added sugar or refined carbs and should be avoided.  · Talk to your dietitian to identify your daily goals for nutrients listed on the label.  Shopping  · Avoid buying canned, premade, or processed foods. These foods tend to be high in fat, sodium,  and added sugar.  · Shop around the outside edge of the grocery store. This includes fresh fruits and vegetables, bulk grains, fresh meats, and fresh dairy.  Cooking  · Use low-heat cooking methods, such as baking, instead of high-heat cooking methods like deep frying.  · Cook using healthy oils, such as olive, canola, or sunflower oil.  · Avoid cooking with butter, cream, or high-fat meats.  Meal planning  · Eat meals and snacks regularly, preferably at the same times every day. Avoid going long periods of time without eating.  · Eat foods high in fiber, such as fresh fruits, vegetables, beans, and whole grains. Talk to your dietitian about how many servings of carbs you can eat at each meal.  · Eat 4-6 ounces (oz) of lean protein each day, such as lean meat, chicken, fish, eggs, or tofu. One oz of lean protein is equal to:  ? 1 oz of meat, chicken, or fish.  ? 1 egg.  ? ¼ cup of tofu.  · Eat some foods each day that contain healthy fats, such as avocado, nuts, seeds, and fish.  Lifestyle  · Check your blood glucose regularly.  · Exercise regularly as told by your health care provider. This may include:  ? 150 minutes of moderate-intensity or vigorous-intensity exercise each week. This could be brisk walking, biking, or water aerobics.  ? Stretching and doing strength exercises, such as yoga or weightlifting, at least 2 times a week.  · Take medicines as told by your health care provider.  · Do not use any products that contain nicotine or tobacco, such as cigarettes and e-cigarettes. If you need help quitting, ask your health care provider.  · Work with a counselor or diabetes educator to identify strategies to manage stress and any emotional and social challenges.  Questions to ask a health care provider  · Do I need to meet with a diabetes educator?  · Do I need to meet with a dietitian?  · What number can I call if I have questions?  · When are the best times to check my blood glucose?  Where to find more  information:  · American Diabetes Association: diabetes.org  · Academy of Nutrition and Dietetics: www.eatright.org  · National Institute of Diabetes and Digestive and Kidney Diseases (NIH): www.niddk.nih.gov  Summary  · A healthy meal plan will help you control your blood glucose and maintain a healthy lifestyle.  · Working with a diet and nutrition specialist (dietitian) can help you make a meal plan that is best for you.  · Keep in mind that carbohydrates (carbs) and alcohol have immediate effects on your blood glucose levels. It is important to count carbs and to use alcohol carefully.  This information is not intended to   replace advice given to you by your health care provider. Make sure you discuss any questions you have with your health care provider.  Document Released: 09/09/2005 Document Revised: 07/13/2017 Document Reviewed: 01/17/2017  Elsevier Interactive Patient Education © 2019 Elsevier Inc.

## 2018-12-22 ENCOUNTER — Other Ambulatory Visit: Payer: Self-pay | Admitting: Family

## 2018-12-22 DIAGNOSIS — IMO0002 Reserved for concepts with insufficient information to code with codable children: Secondary | ICD-10-CM

## 2018-12-22 DIAGNOSIS — E1165 Type 2 diabetes mellitus with hyperglycemia: Secondary | ICD-10-CM

## 2018-12-22 LAB — CMP14+EGFR
ALBUMIN: 4.4 g/dL (ref 3.6–4.8)
ALT: 23 IU/L (ref 0–32)
AST: 26 IU/L (ref 0–40)
Albumin/Globulin Ratio: 1.6 (ref 1.2–2.2)
Alkaline Phosphatase: 86 IU/L (ref 39–117)
BUN / CREAT RATIO: 15 (ref 12–28)
BUN: 15 mg/dL (ref 8–27)
Bilirubin Total: 0.4 mg/dL (ref 0.0–1.2)
CO2: 24 mmol/L (ref 20–29)
CREATININE: 1.01 mg/dL — AB (ref 0.57–1.00)
Calcium: 10.1 mg/dL (ref 8.7–10.3)
Chloride: 102 mmol/L (ref 96–106)
GFR calc non Af Amer: 57 mL/min/{1.73_m2} — ABNORMAL LOW (ref >59)
GFR, EST AFRICAN AMERICAN: 66 mL/min/{1.73_m2} (ref >59)
Globulin, Total: 2.7 g/dL (ref 1.5–4.5)
Glucose: 143 mg/dL — ABNORMAL HIGH (ref 65–99)
Potassium: 4.1 mmol/L (ref 3.5–5.2)
Sodium: 141 mmol/L (ref 134–144)
TOTAL PROTEIN: 7.1 g/dL (ref 6.0–8.5)

## 2018-12-22 NOTE — Telephone Encounter (Signed)
Handicap form completed and sent to [facility] to sign.  Informed patient we would call her when form is ready to pick up.

## 2018-12-22 NOTE — Telephone Encounter (Signed)
Yes, this is fine. Please do not charge her as she was here yesterday. If needed I will complete form to avoid charge.

## 2018-12-25 ENCOUNTER — Other Ambulatory Visit: Payer: Self-pay | Admitting: Pharmacy Technician

## 2018-12-25 NOTE — Patient Outreach (Signed)
[place] [facility]) Care Management  12/25/2018  Renee [name] 08/24/1951 [id number]    Follow up call placed to [employer] regarding patient assistance application(s) for Renee [name] confirms patient has been approved as of 12/27 and should receive medication in 10-14 business days.   Will follow up with patient in 14-16 business days to confirm medication has been received.   Maud Deed Chana Bode [place] Certified Pharmacy Technician [place] Management Direct Dial:[phone]

## 2019-01-02 ENCOUNTER — Telehealth: Payer: Self-pay | Admitting: Family

## 2019-01-02 MED ORDER — VITAMIN D (ERGOCALCIFEROL) 1.25 MG (50000 UNIT) PO CAPS: 50000.0000 [IU] | ORAL_CAPSULE | ORAL | 3 refills | Status: DC

## 2019-01-02 NOTE — Telephone Encounter (Signed)
Pharmacy [facility] [place]   PT states that she recently saw Alyse Low and that Alyse Low was suppose to send her vitamin medicine to pharmacy and she states that [facility] and they have not got anything on it yet. Pt is not sure of the name states that her and Alyse Low had talked about it in her OV and that she was going to send it in for her so she wouldn't have to drive to [place] to get it?

## 2019-01-02 NOTE — Telephone Encounter (Signed)
Vit D Prescription sent to pharmacy

## 2019-01-02 NOTE — Telephone Encounter (Signed)
Patient aware.

## 2019-01-15 ENCOUNTER — Other Ambulatory Visit: Payer: Self-pay | Admitting: Pharmacy Technician

## 2019-01-15 NOTE — Patient Outreach (Addendum)
[place] [facility]) Care Management  01/15/2019  Renee [name] [birth year] [id number]    Unsuccessful call #1 placed to patient regarding patient assistance medication receipt from [employer], HIPAA compliant voicemail left.   Will make 2nd call attempt in 2-3 business days if call has not been returned.  Maud Deed Chana Bode [place] Certified Pharmacy Technician [place] Management Direct Dial:[phone]   ADDENDUM 3:15pm  Incoming call from patient HIPAA identifiers verified. Patient states she has not received her Januvia from [employer] patient assistance. Patient states that she would prefer that medication be delivered to providers office.  Contact [facility] and spoke to Baird who states that medication ha been shipped out and it usually takes 7-10 business days for it to arrive. Sara Chu states that patient would need to call in when refill is due and inform them that she would like the next fill to be mailed to her providers office.  Contacted Renee [name] back and informed her of information from [employer].   Will follow up with patient in 5-7 business days if she has not contacted me to inform me of receiving medication.  Maud Deed Chana Bode [place] Certified Pharmacy Technician [place] Management Direct Dial:[phone]

## 2019-01-18 ENCOUNTER — Other Ambulatory Visit: Payer: Self-pay

## 2019-01-18 ENCOUNTER — Other Ambulatory Visit: Payer: Self-pay | Admitting: Pharmacy Technician

## 2019-01-18 NOTE — Patient Outreach (Signed)
[place] [facility]) Care Management  01/18/2019  Renee [name] 10/27/1951 [id number]    Successful call placed to patient regarding patient assistance medication receipt from [place], HIPAA identifiers verified. [name] confirms that she received her Januvia and that she doesn't have any questions about it currently. Reviewed with her how to obtain refills and requested she save my number in case she runs into any issues.  Will route note to [facility] for case closure.  Maud Deed Chana Bode [place] Certified Pharmacy Technician [place] Management Direct [phone]

## 2019-01-18 NOTE — Patient Outreach (Signed)
[place] [facility]) Care Management [place]  01/18/2019  MELANEE CORDIAL 08/27/1951 [id number]  Reason for referral: medication assistance  [facility] pharmacy case is being closed due to the following reasons:  Goals have been met.  Patient assistance obtained for Januvia.   Patient has been provided [facility] CM contact information if assistance needed in the future.    Thank you for allowing [facility] pharmacy to be involved in this patient's care.   Joetta Manners, PharmD Clinical Pharmacist [place] [phone]

## 2019-01-21 ENCOUNTER — Other Ambulatory Visit: Payer: Self-pay | Admitting: Family

## 2019-01-21 DIAGNOSIS — I10 Essential (primary) hypertension: Principal | ICD-10-CM

## 2019-01-21 DIAGNOSIS — E1159 Type 2 diabetes mellitus with other circulatory complications: Secondary | ICD-10-CM

## 2019-02-06 DIAGNOSIS — M79676 Pain in unspecified toe(s): Secondary | ICD-10-CM | POA: Diagnosis not present

## 2019-02-06 DIAGNOSIS — L84 Corns and callosities: Secondary | ICD-10-CM | POA: Diagnosis not present

## 2019-02-06 DIAGNOSIS — B351 Tinea unguium: Secondary | ICD-10-CM | POA: Diagnosis not present

## 2019-02-06 DIAGNOSIS — E1151 Type 2 diabetes mellitus with diabetic peripheral angiopathy without gangrene: Secondary | ICD-10-CM | POA: Diagnosis not present

## 2019-02-07 ENCOUNTER — Other Ambulatory Visit: Payer: Self-pay | Admitting: Family

## 2019-02-07 DIAGNOSIS — E1169 Type 2 diabetes mellitus with other specified complication: Secondary | ICD-10-CM

## 2019-02-07 DIAGNOSIS — E785 Hyperlipidemia, unspecified: Principal | ICD-10-CM

## 2019-02-14 ENCOUNTER — Other Ambulatory Visit: Payer: Self-pay | Admitting: Family

## 2019-02-14 DIAGNOSIS — E1165 Type 2 diabetes mellitus with hyperglycemia: Secondary | ICD-10-CM

## 2019-02-14 DIAGNOSIS — IMO0002 Reserved for concepts with insufficient information to code with codable children: Secondary | ICD-10-CM

## 2019-02-15 ENCOUNTER — Ambulatory Visit (INDEPENDENT_AMBULATORY_CARE_PROVIDER_SITE_OTHER): Payer: Medicare Other | Admitting: "Endocrinology

## 2019-02-15 ENCOUNTER — Encounter: Payer: Self-pay | Admitting: "Endocrinology

## 2019-02-15 VITALS — BP 137/83 | HR 71 | Ht 66.0 in | Wt 176.0 lb

## 2019-02-15 DIAGNOSIS — E782 Mixed hyperlipidemia: Secondary | ICD-10-CM | POA: Insufficient documentation

## 2019-02-15 DIAGNOSIS — I10 Essential (primary) hypertension: Secondary | ICD-10-CM | POA: Diagnosis not present

## 2019-02-15 DIAGNOSIS — IMO0002 Reserved for concepts with insufficient information to code with codable children: Secondary | ICD-10-CM

## 2019-02-15 DIAGNOSIS — E1165 Type 2 diabetes mellitus with hyperglycemia: Secondary | ICD-10-CM | POA: Diagnosis not present

## 2019-02-15 MED ORDER — INSULIN DEGLUDEC 100 UNIT/ML ~~LOC~~ SOPN: 20.0000 [IU] | PEN_INJECTOR | Freq: Every day | SUBCUTANEOUS | 1 refills | Status: DC

## 2019-02-15 MED ORDER — GLIPIZIDE 5 MG PO TABS: 5.0000 mg | ORAL_TABLET | Freq: Two times a day (BID) | ORAL | 3 refills | Status: DC

## 2019-02-15 NOTE — Patient Instructions (Signed)

## 2019-02-15 NOTE — Progress Notes (Signed)
Endocrinology Consult Note       02/15/2019, 10:43 AM   Subjective:    Patient ID: Renee [name], female    DOB: 1951-09-02.  Renee [name] is being seen in consultation for management of currently uncontrolled symptomatic diabetes requested by  Sharion Balloon, FNP.   Past Medical History:  Diagnosis Date  . Arthritis   . Breast cancer ([place]) 2001   right mastectomy  . CVA (cerebral infarction) 2003, 2005  . DM (diabetes mellitus) ([place])   . GERD (gastroesophageal reflux disease)   . Heart murmur   . Hyperlipidemia   . Hypertension   . Seizures ([place])   . Vitamin D deficiency    Past Surgical History:  Procedure Laterality Date  . ABDOMINAL HYSTERECTOMY  2000  . GANGLION CYST EXCISION  2010   right (2010)  and left (before 2010) hand  . MASTECTOMY  2001   right  . TONSILECTOMY/ADENOIDECTOMY WITH MYRINGOTOMY Bilateral    age 31's   Social History   Socioeconomic History  . Marital status: Single    Spouse name: Not on file  . Number of children: Not on file  . Years of education: Not on file  . Highest education level: Not on file  Occupational History  . Not on file  Social Needs  . Financial resource strain: Not on file  . Food insecurity:    Worry: Not on file    Inability: Not on file  . Transportation needs:    Medical: Not on file    Non-medical: Not on file  Tobacco Use  . Smoking status: Never Smoker  . Smokeless tobacco: Never Used  Substance and Sexual Activity  . Alcohol use: Not Currently    Alcohol/week: 1.0 standard drinks    Types: 1 Glasses of wine per week  . Drug use: No  . Sexual activity: Yes    Birth control/protection: Post-menopausal  Lifestyle  . Physical activity:    Days per week: Not on file    Minutes per session: Not on file  . Stress: Not on file  Relationships  . Social connections:    Talks on phone: Not on file    Gets together: Not  on file    Attends religious service: Not on file    Active member of club or organization: Not on file    Attends meetings of clubs or organizations: Not on file    Relationship status: Not on file  Other Topics Concern  . Not on file  Social History Narrative  . Not on file   Outpatient Encounter Medications as of 02/15/2019  Medication Sig  . aspirin 81 MG tablet Take 81 mg by mouth daily.  . bisoprolol-hydrochlorothiazide (ZIAC) 2.5-6.25 MG tablet TAKE 1 TABLET BY MOUTH ONCE DAILY  . Blood Glucose Calibration (ONETOUCH VERIO) SOLN Use as needed to calibrate glucometer  . Blood Glucose Monitoring Suppl ([place]) w/Device KIT Use to check blood sugar twice daily  . docusate sodium (COLACE) 100 MG capsule Take 100 mg by mouth daily.  Marland Kitchen gabapentin (NEURONTIN) 400 MG capsule Take 1 capsule (400 mg  total) by mouth 3 (three) times daily.  Marland Kitchen glipiZIDE (GLUCOTROL) 5 MG tablet Take 1 tablet (5 mg total) by mouth 2 (two) times daily before a meal.  . glucose blood (ONETOUCH VERIO) test strip Use to check blood sugar twice daily  . insulin degludec (TRESIBA FLEXTOUCH) 100 UNIT/ML SOPN FlexTouch Pen Inject 0.2 mLs (20 Units total) into the skin at bedtime.  . Multiple Vitamin (MULTIVITAMIN WITH MINERALS) TABS tablet Take 1 tablet by mouth daily.  Marland Kitchen omeprazole (PRILOSEC) 20 MG capsule Take 1 capsule (20 mg total) by mouth daily.  [name] DELICA LANCETS 69V MISC Use to check BG up to bid.  Dx: E11.65 type 2 DM with insulin therapy, uncontrolled  . simvastatin (ZOCOR) 40 MG tablet TAKE 1 TABLET BY MOUTH AT BEDTIME  . Vitamin D, Ergocalciferol, (DRISDOL) 1.25 MG (50000 UT) CAPS capsule Take 1 capsule (50,000 Units total) by mouth every 7 (seven) days.  . [DISCONTINUED] Dulaglutide (TRULICITY) 9.48 AX/6.5VV SOPN Inject 0.75 mg into the skin once a week.  . [DISCONTINUED] glipiZIDE (GLUCOTROL) 10 MG tablet TAKE 1 TABLET BY MOUTH IN THE MORNING BEFORE BREAKFAST  . [DISCONTINUED]  insulin degludec (TRESIBA FLEXTOUCH) 100 UNIT/ML SOPN FlexTouch Pen Inject 0.15 mLs (15 Units total) into the skin daily at 10 pm. (Patient taking differently: Inject 23 Units into the skin daily at 10 pm. )   No facility-administered encounter medications on file as of 02/15/2019.     ALLERGIES: Allergies  Allergen Reactions  . Metformin And Related Other (See Comments)    seizures  . Penicillins     VACCINATION STATUS:  There is no immunization history on file for this patient.  Diabetes  She presents for her initial diabetic visit. She has type 2 diabetes mellitus. Onset time: Patient says she was diagnosed last year, however her A1c has been high enough for diabetes at least since 2015.  Have A1c is as high as greater than 14%. Her disease course has been fluctuating. There are no hypoglycemic associated symptoms. Pertinent negatives for hypoglycemia include no confusion, headaches, pallor or seizures. Associated symptoms include fatigue, polydipsia and polyuria. Pertinent negatives for diabetes include no chest pain and no polyphagia. There are no hypoglycemic complications. Symptoms are worsening. Diabetic complications include peripheral neuropathy. Risk factors for coronary artery disease include dyslipidemia, diabetes mellitus, family history, hypertension, sedentary lifestyle and post-menopausal. Current diabetic treatments: She is currently on Tresiba 23 units nightly, glipizide 10 mg p.o. twice daily, Januvia 100 mg p.o. once a day.  She does not tolerate metformin. She is compliant with treatment most of the time. Her weight is fluctuating minimally. She is following a generally unhealthy diet. When asked about meal planning, she reported none. She has not had a previous visit with a dietitian. She never participates in exercise. Her home blood glucose trend is fluctuating minimally. Her overall blood glucose range is 180-200 mg/dl. (Her most recent labs show A1c of 8.8%.  She did have  A1c as high as > 14% on separate occasions in the past.) An ACE inhibitor/angiotensin II receptor blocker is not being taken.  Hyperlipidemia  This is a chronic problem. The current episode started more than 1 year ago. The problem is controlled. Exacerbating diseases include diabetes. Pertinent negatives include no chest pain, myalgias or shortness of breath. Current antihyperlipidemic treatment includes statins. Risk factors for coronary artery disease include diabetes mellitus, dyslipidemia, hypertension, a sedentary lifestyle and post-menopausal.  Hypertension  This is a chronic problem. The current episode started more  than 1 year ago. The problem is controlled. Pertinent negatives include no chest pain, headaches, palpitations or shortness of breath. Risk factors for coronary artery disease include dyslipidemia, diabetes mellitus, sedentary lifestyle, family history and post-menopausal state. Past treatments include beta blockers.      Review of Systems  Constitutional: Positive for fatigue. Negative for chills, fever and unexpected weight change.  HENT: Negative for trouble swallowing and voice change.   Eyes: Negative for visual disturbance.  Respiratory: Negative for cough, shortness of breath and wheezing.   Cardiovascular: Negative for chest pain, palpitations and leg swelling.  Gastrointestinal: Negative for diarrhea, nausea and vomiting.  Endocrine: Positive for polydipsia and polyuria. Negative for cold intolerance, heat intolerance and polyphagia.  Musculoskeletal: Negative for arthralgias and myalgias.  Skin: Negative for color change, pallor, rash and wound.  Neurological: Negative for seizures and headaches.  Psychiatric/Behavioral: Negative for confusion and suicidal ideas.    Objective:    BP 137/83   Pulse 71   Ht '5\' 6"'  (1.676 m)   Wt 176 lb (79.8 kg)   BMI 28.41 kg/m   Wt Readings from Last 3 Encounters:  02/15/19 176 lb (79.8 kg)  12/21/18 177 lb 9.6 oz (80.6  kg)  11/20/18 175 lb (79.4 kg)     Physical Exam Constitutional:      Appearance: She is well-developed.  HENT:     Head: Normocephalic and atraumatic.  Neck:     Musculoskeletal: Normal range of motion and neck supple.     Thyroid: No thyromegaly.     Trachea: No tracheal deviation.  Cardiovascular:     Rate and Rhythm: Normal rate and regular rhythm.  Pulmonary:     Effort: Pulmonary effort is normal.     Breath sounds: Normal breath sounds.  Abdominal:     General: Bowel sounds are normal.     Palpations: Abdomen is soft.     Tenderness: There is no abdominal tenderness. There is no guarding.  Musculoskeletal: Normal range of motion.  Skin:    General: Skin is warm and dry.     Coloration: Skin is not pale.     Findings: No erythema or rash.  Neurological:     Mental Status: She is alert and oriented to person, place, and time.     Cranial Nerves: No cranial nerve deficit.     Coordination: Coordination normal.     Deep Tendon Reflexes: Reflexes are normal and symmetric.  Psychiatric:        Judgment: Judgment normal.       CMP ( most recent) CMP     Component Value Date/Time   NA 141 12/21/2018 1034   K 4.1 12/21/2018 1034   CL 102 12/21/2018 1034   CO2 24 12/21/2018 1034   GLUCOSE 143 (H) 12/21/2018 1034   GLUCOSE 185 (H) 01/15/2016 0046   BUN 15 12/21/2018 1034   CREATININE 1.01 (H) 12/21/2018 1034   CALCIUM 10.1 12/21/2018 1034   PROT 7.1 12/21/2018 1034   ALBUMIN 4.4 12/21/2018 1034   AST 26 12/21/2018 1034   ALT 23 12/21/2018 1034   ALKPHOS 86 12/21/2018 1034   BILITOT 0.4 12/21/2018 1034   GFRNONAA 57 (L) 12/21/2018 1034   GFRAA 66 12/21/2018 1034     Diabetic Labs (most recent): Lab Results  Component Value Date   HGBA1C 8.8 (H) 12/21/2018   HGBA1C 8.7 (H) 11/20/2018   HGBA1C 8.2 (H) 08/21/2018     Lipid Panel ( most recent) Lipid Panel  Component Value Date/Time   CHOL 135 07/18/2018 1103   TRIG 125 07/18/2018 1103   HDL 43  07/18/2018 1103   CHOLHDL 3.1 07/18/2018 1103   LDLCALC 67 07/18/2018 1103      Lab Results  Component Value Date   TSH 2.710 07/05/2016       Assessment & Plan:   1. Uncontrolled type 2 diabetes mellitus, without long-term current use of insulin (Renee [name])  - Renee [name] has currently uncontrolled symptomatic type 2 DM since approximately age of 68 years of age,  with most recent A1c of 8.8 %. Recent labs reviewed.  She believes she only had prediabetes until last year, however her records show A1c high enough for diabetes at least from 2015.  - I had a long discussion with her about the progressive nature of diabetes and the pathology behind its complications. -her diabetes is complicated by peripheral neuropathy and she remains at a high risk for more acute and chronic complications which include CAD, CVA, CKD, retinopathy, and neuropathy. These are all discussed in detail with her.  - I have counseled her on diet management by adopting a carbohydrate restricted/protein rich diet. - she admits that there is a room for improvement in her food and drink choices. - Suggestion is made for her to avoid simple carbohydrates  from her diet including Cakes, Sweet Desserts, Ice Cream, Soda (diet and regular), Sweet Tea, Candies, Chips, Cookies, Store Bought Juices, Alcohol in Excess of  1-2 drinks a day, Artificial Sweeteners,  Coffee Creamer, and "Sugar-free" Products. This will help patient to have more stable blood glucose profile and potentially avoid unintended weight gain.  - I encouraged her to switch to  unprocessed or minimally processed complex starch and increased protein intake (animal or plant source), fruits, and vegetables.  - she is advised to stick to a routine mealtimes to eat 3 meals  a day and avoid unnecessary snacks ( to snack only to correct hypoglycemia).   - she will be scheduled with Jearld Fenton, RDN, CDE for individualized diabetes education.  - I have  approached her with the following individualized plan to manage diabetes and patient agrees:   -After her recent initiation with basal insulin, her glycemic profile is improving towards target. -She will not require prandial insulin for now. -I discussed and lowered her Tyler Aas to 20 units nightly, initiate strict monitoring of blood glucose 4 times a day-before meals and at bedtime and return in 1 week with her meter and logs for reevaluation. - she is warned not to take insulin without proper monitoring per orders. - Adjustment parameters are given to her for hypo and hyperglycemia in writing. - she is encouraged to call clinic for blood glucose levels less than 70 or above 300 mg /dl. -She reports significant intolerance to metformin and does not wish to resume. -I advised her to continue Januvia 100 mg p.o. daily with breakfast, and advised her to lower her glipizide to 5 mg p.o. twice daily with breakfast and supper. -She is not a candidate for SGLT2 inhibitors nor GLP-1 receptor agonist.   - Patient specific target  A1c;  LDL, HDL, Triglycerides, and  Waist Circumference were discussed in detail.  2) Blood Pressure /Hypertension:  her blood pressure is controlled to target.   she is advised to continue her current medications including bisoprolol-hydrochlorothiazide 12.5-6.25 p.o. twice daily.   3) Lipids/Hyperlipidemia:   Review of her recent lipid panel showed  controlled  LDL at 67 .  she  is advised to continue    simvastatin 40 mg daily at bedtime.  Side effects and precautions discussed with her.  4)  Weight/Diet:  Body mass index is 28.41 kg/m.  -She is not a candidate for weight loss.  I discussed with her the fact that loss of 5 - 10% of her  current body weight will have the most impact on her diabetes management.  CDE Consult will be initiated . Exercise, and detailed carbohydrates information provided  -  detailed on discharge instructions.  5) Chronic Care/Health  Maintenance:  -she  is on statin medications and  is encouraged to initiate and continue to follow up with Ophthalmology, Dentist,  Podiatrist at least yearly or according to recommendations, and advised to  stay away from smoking. I have recommended yearly flu vaccine and pneumonia vaccine at least every 5 years; moderate intensity exercise for up to 150 minutes weekly; and  sleep for at least 7 hours a day.  - she is  advised to maintain close follow up with Sharion Balloon, FNP for primary care needs, as well as her other providers for optimal and coordinated care.  - Time spent with the patient: 35 minutes, of which >50% was spent in obtaining information about her symptoms, reviewing her previous labs/studies, evaluations, and treatments, counseling her about her currently uncontrolled type 2 diabetes, hyperlipidemia, and developing plans for long term treatment based on the latest standards of care/guidelines.    [name] participated in the discussions, expressed understanding, and voiced agreement with the above plans.  All questions were answered to her satisfaction. she is encouraged to contact clinic should she have any questions or concerns prior to her return visit.  Follow up plan: - Return in about 1 week (around 02/22/2019) for Follow up with Meter and Logs Only - no Labs.  Glade Lloyd, MD [facility] [address] [place],  [address] Phone: [phone]  Fax: [phone]    02/15/2019, 10:43 AM  This note was partially dictated with voice recognition software. Similar sounding words can be transcribed inadequately or may not  be corrected upon review.

## 2019-02-21 ENCOUNTER — Encounter: Payer: Self-pay | Admitting: "Endocrinology

## 2019-02-21 ENCOUNTER — Ambulatory Visit (INDEPENDENT_AMBULATORY_CARE_PROVIDER_SITE_OTHER): Payer: Medicare Other | Admitting: "Endocrinology

## 2019-02-21 VITALS — BP 131/80 | HR 72 | Ht 66.0 in | Wt 173.0 lb

## 2019-02-21 DIAGNOSIS — I10 Essential (primary) hypertension: Secondary | ICD-10-CM | POA: Diagnosis not present

## 2019-02-21 DIAGNOSIS — E782 Mixed hyperlipidemia: Secondary | ICD-10-CM | POA: Diagnosis not present

## 2019-02-21 DIAGNOSIS — E1165 Type 2 diabetes mellitus with hyperglycemia: Secondary | ICD-10-CM

## 2019-02-21 DIAGNOSIS — IMO0002 Reserved for concepts with insufficient information to code with codable children: Secondary | ICD-10-CM

## 2019-02-21 NOTE — Progress Notes (Signed)
Endocrinology follow-up Note       02/21/2019, 11:16 AM   Subjective:    Patient ID: Renee [name], female    DOB: 09-Feb-1951.  Renee [name] is being seen in follow-up for the management of her currently uncontrolled type 2 diabetes, hyperlipidemia, hypertension. PCP:   Sharion Balloon, FNP.   Past Medical History:  Diagnosis Date  . Arthritis   . Breast cancer ([place]) 2001   right mastectomy  . CVA (cerebral infarction) 2003, 2005  . DM (diabetes mellitus) ([place])   . GERD (gastroesophageal reflux disease)   . Heart murmur   . Hyperlipidemia   . Hypertension   . Seizures ([place])   . Vitamin D deficiency    Past Surgical History:  Procedure Laterality Date  . ABDOMINAL HYSTERECTOMY  2000  . GANGLION CYST EXCISION  2010   right (2010)  and left (before 2010) hand  . MASTECTOMY  2001   right  . TONSILECTOMY/ADENOIDECTOMY WITH MYRINGOTOMY Bilateral    age 68's   Social History   Socioeconomic History  . Marital status: Single    Spouse name: Not on file  . Number of children: Not on file  . Years of education: Not on file  . Highest education level: Not on file  Occupational History  . Not on file  Social Needs  . Financial resource strain: Not on file  . Food insecurity:    Worry: Not on file    Inability: Not on file  . Transportation needs:    Medical: Not on file    Non-medical: Not on file  Tobacco Use  . Smoking status: Never Smoker  . Smokeless tobacco: Never Used  Substance and Sexual Activity  . Alcohol use: Not Currently    Alcohol/week: 1.0 standard drinks    Types: 1 Glasses of wine per week  . Drug use: No  . Sexual activity: Yes    Birth control/protection: Post-menopausal  Lifestyle  . Physical activity:    Days per week: Not on file    Minutes per session: Not on file  . Stress: Not on file  Relationships  . Social connections:    Talks on phone: Not on  file    Gets together: Not on file    Attends religious service: Not on file    Active member of club or organization: Not on file    Attends meetings of clubs or organizations: Not on file    Relationship status: Not on file  Other Topics Concern  . Not on file  Social History Narrative  . Not on file   Outpatient Encounter Medications as of 02/21/2019  Medication Sig  . sitaGLIPtin (JANUVIA) 100 MG tablet Take 100 mg by mouth daily.  Marland Kitchen aspirin 81 MG tablet Take 81 mg by mouth daily.  . bisoprolol-hydrochlorothiazide (ZIAC) 2.5-6.25 MG tablet TAKE 1 TABLET BY MOUTH ONCE DAILY  . Blood Glucose Calibration (ONETOUCH VERIO) SOLN Use as needed to calibrate glucometer  . Blood Glucose Monitoring Suppl ([place]) w/Device KIT Use to check blood sugar twice daily  . docusate sodium (COLACE) 100 MG capsule  Take 100 mg by mouth daily.  Marland Kitchen gabapentin (NEURONTIN) 400 MG capsule Take 1 capsule (400 mg total) by mouth 3 (three) times daily.  Marland Kitchen glucose blood (ONETOUCH VERIO) test strip Use to check blood sugar twice daily  . insulin degludec (TRESIBA FLEXTOUCH) 100 UNIT/ML SOPN FlexTouch Pen Inject 0.2 mLs (20 Units total) into the skin at bedtime.  . Multiple Vitamin (MULTIVITAMIN WITH MINERALS) TABS tablet Take 1 tablet by mouth daily.  Marland Kitchen omeprazole (PRILOSEC) 20 MG capsule Take 1 capsule (20 mg total) by mouth daily.  Renee [name] DELICA LANCETS 68L MISC Use to check BG up to bid.  Dx: E11.65 type 2 DM with insulin therapy, uncontrolled  . simvastatin (ZOCOR) 40 MG tablet TAKE 1 TABLET BY MOUTH AT BEDTIME  . Vitamin D, Ergocalciferol, (DRISDOL) 1.25 MG (50000 UT) CAPS capsule Take 1 capsule (50,000 Units total) by mouth every 7 (seven) days.  . [DISCONTINUED] glipiZIDE (GLUCOTROL) 5 MG tablet Take 1 tablet (5 mg total) by mouth 2 (two) times daily before a meal.   No facility-administered encounter medications on file as of 02/21/2019.     ALLERGIES: Allergies  Allergen Reactions   . Metformin And Related Other (See Comments)    seizures  . Penicillins     VACCINATION STATUS:  There is no immunization history on file for this patient.  Diabetes  She presents for her follow-up diabetic visit. She has type 2 diabetes mellitus. Onset time: Patient says she was diagnosed last year, however her A1c has been high enough for diabetes at least since 2015.  Have A1c is as high as greater than 14%. Her disease course has been improving. There are no hypoglycemic associated symptoms. Pertinent negatives for hypoglycemia include no confusion, headaches, pallor or seizures. Pertinent negatives for diabetes include no chest pain, no fatigue, no polydipsia, no polyphagia and no polyuria. There are no hypoglycemic complications. Symptoms are worsening. Diabetic complications include peripheral neuropathy. Risk factors for coronary artery disease include dyslipidemia, diabetes mellitus, family history, hypertension, sedentary lifestyle and post-menopausal. Current diabetic treatments: She is currently on Tresiba 23 units nightly, glipizide 10 mg p.o. twice daily, Januvia 100 mg p.o. once a day.  She does not tolerate metformin. She is compliant with treatment most of the time. Her weight is fluctuating minimally. She is following a generally unhealthy diet. When asked about meal planning, she reported none. She has not had a previous visit with a dietitian. She never participates in exercise. Her home blood glucose trend is fluctuating minimally. Her breakfast blood glucose range is generally 140-180 mg/dl. Her lunch blood glucose range is generally 140-180 mg/dl. Her dinner blood glucose range is generally 140-180 mg/dl. Her bedtime blood glucose range is generally 140-180 mg/dl. Her overall blood glucose range is 140-180 mg/dl. (Her most recent labs show A1c of 8.8%.  She returns with improved glycemic profile both fasting and postprandial.  She did have A1c as high as > 14% on separate occasions  in the past.) An ACE inhibitor/angiotensin II receptor blocker is not being taken.  Hyperlipidemia  This is a chronic problem. The current episode started more than 1 year ago. The problem is controlled. Exacerbating diseases include diabetes. Pertinent negatives include no chest pain, myalgias or shortness of breath. Current antihyperlipidemic treatment includes statins. Risk factors for coronary artery disease include diabetes mellitus, dyslipidemia, hypertension, a sedentary lifestyle and post-menopausal.  Hypertension  This is a chronic problem. The current episode started more than 1 year ago. The problem is controlled. Pertinent  negatives include no chest pain, headaches, palpitations or shortness of breath. Risk factors for coronary artery disease include dyslipidemia, diabetes mellitus, sedentary lifestyle, family history and post-menopausal state. Past treatments include beta blockers.     Review of Systems  Constitutional: Negative for chills, fatigue, fever and unexpected weight change.  HENT: Negative for trouble swallowing and voice change.   Eyes: Negative for visual disturbance.  Respiratory: Negative for cough, shortness of breath and wheezing.   Cardiovascular: Negative for chest pain, palpitations and leg swelling.  Gastrointestinal: Negative for diarrhea, nausea and vomiting.  Endocrine: Negative for cold intolerance, heat intolerance, polydipsia, polyphagia and polyuria.  Musculoskeletal: Negative for arthralgias and myalgias.  Skin: Negative for color change, pallor, rash and wound.  Neurological: Negative for seizures and headaches.  Psychiatric/Behavioral: Negative for confusion and suicidal ideas.    Objective:    BP 131/80   Pulse 72   Ht 5' 6" (1.676 m)   Wt 173 lb (78.5 kg)   BMI 27.92 kg/m   Wt Readings from Last 3 Encounters:  02/21/19 173 lb (78.5 kg)  02/15/19 176 lb (79.8 kg)  12/21/18 177 lb 9.6 oz (80.6 kg)     Physical Exam Constitutional:       Appearance: She is well-developed.  HENT:     Head: Normocephalic and atraumatic.  Neck:     Musculoskeletal: Normal range of motion and neck supple.     Thyroid: No thyromegaly.     Trachea: No tracheal deviation.  Cardiovascular:     Rate and Rhythm: Normal rate and regular rhythm.  Pulmonary:     Effort: Pulmonary effort is normal.     Breath sounds: Normal breath sounds.  Abdominal:     General: Bowel sounds are normal.     Palpations: Abdomen is soft.     Tenderness: There is no abdominal tenderness. There is no guarding.  Musculoskeletal: Normal range of motion.  Skin:    General: Skin is warm and dry.     Coloration: Skin is not pale.     Findings: No erythema or rash.  Neurological:     Mental Status: She is alert and oriented to person, place, and time.     Cranial Nerves: No cranial nerve deficit.     Coordination: Coordination normal.     Deep Tendon Reflexes: Reflexes are normal and symmetric.  Psychiatric:        Judgment: Judgment normal.       CMP ( most recent) CMP     Component Value Date/Time   NA 141 12/21/2018 1034   K 4.1 12/21/2018 1034   CL 102 12/21/2018 1034   CO2 24 12/21/2018 1034   GLUCOSE 143 (H) 12/21/2018 1034   GLUCOSE 185 (H) 01/15/2016 0046   BUN 15 12/21/2018 1034   CREATININE 1.01 (H) 12/21/2018 1034   CALCIUM 10.1 12/21/2018 1034   PROT 7.1 12/21/2018 1034   ALBUMIN 4.4 12/21/2018 1034   AST 26 12/21/2018 1034   ALT 23 12/21/2018 1034   ALKPHOS 86 12/21/2018 1034   BILITOT 0.4 12/21/2018 1034   GFRNONAA 57 (L) 12/21/2018 1034   GFRAA 66 12/21/2018 1034     Diabetic Labs (most recent): Lab Results  Component Value Date   HGBA1C 8.8 (H) 12/21/2018   HGBA1C 8.7 (H) 11/20/2018   HGBA1C 8.2 (H) 08/21/2018     Lipid Panel ( most recent) Lipid Panel     Component Value Date/Time   CHOL 135 07/18/2018 1103   TRIG  125 07/18/2018 1103   HDL 43 07/18/2018 1103   CHOLHDL 3.1 07/18/2018 1103   LDLCALC 67 07/18/2018 1103       Lab Results  Component Value Date   TSH 2.710 07/05/2016       Assessment & Plan:   1. Uncontrolled type 2 diabetes mellitus, without long-term current use of insulin ([place])  - Renee [name] has currently uncontrolled symptomatic type 2 DM since approximately age of 68 years of age,  with most recent A1c of 8.8 %. Recent labs reviewed.  -She returns with improved glycemic profile both fasting and postprandial.   -her diabetes is complicated by peripheral neuropathy and she remains at a high risk for more acute and chronic complications which include CAD, CVA, CKD, retinopathy, and neuropathy. These are all discussed in detail with her.  - I have counseled her on diet management by adopting a carbohydrate restricted/protein rich diet.  - Patient admits there is a room for improvement in her diet and drink choices. -  Suggestion is made for her to avoid simple carbohydrates  from her diet including Cakes, Sweet Desserts / Pastries, Ice Cream, Soda (diet and regular), Sweet Tea, Candies, Chips, Cookies, Store Bought Juices, Alcohol in Excess of  1-2 drinks a day, Artificial Sweeteners, and "Sugar-free" Products. This will help patient to have stable blood glucose profile and potentially avoid unintended weight gain.  - I encouraged her to switch to  unprocessed or minimally processed complex starch and increased protein intake (animal or plant source), fruits, and vegetables.  - she is advised to stick to a routine mealtimes to eat 3 meals  a day and avoid unnecessary snacks ( to snack only to correct hypoglycemia).   - she will be scheduled with Renee [name], RDN, CDE for individualized diabetes education.  - I have approached her with the following individualized plan to manage diabetes and patient agrees:   -After her recent initiation with basal insulin, her glycemic profile continues to improve towards target.  -She would not require prandial insulin for now.   -She is  advised to continue Tresiba 20 units nightly, continue to monitor blood glucose 2 times a day-daily before breakfast and at bedtime.   - she is encouraged to call clinic for blood glucose levels less than 70 or above 300 mg /dl. -She reports significant intolerance to metformin and does not wish to resume. -She is advised to continue Januvia 100 mg p.o. daily with breakfast, and advised to discontinue glipizide at this time.   -She is not a candidate for SGLT2 inhibitors nor GLP-1 receptor agonist.   - Patient specific target  A1c;  LDL, HDL, Triglycerides, and  Waist Circumference were discussed in detail.  2) Blood Pressure /Hypertension:  her blood pressure is controlled to target.  She is advised to continue  her current medications including bisoprolol-hydrochlorothiazide 12.5-6.25 p.o. twice daily.   3) Lipids/Hyperlipidemia:   Review of her recent lipid panel showed  controlled  LDL at 67 .  she  is advised to continue simvastatin 40 mg p.o. daily at bedtime.  Side effects and precautions discussed with her.  4)  Weight/Diet:  Body mass index is 27.92 kg/m.  -She is not a candidate for weight loss.  I discussed with her the fact that loss of 5 - 10% of her  current body weight will have the most impact on her diabetes management.  CDE Consult will be initiated . Exercise, and detailed carbohydrates information provided  -  detailed on discharge instructions.  5) Chronic Care/Health Maintenance:  -she  is on statin medications and  is encouraged to initiate and continue to follow up with Ophthalmology, Dentist,  Podiatrist at least yearly or according to recommendations, and advised to  stay away from smoking. I have recommended yearly flu vaccine and pneumonia vaccine at least every 5 years; moderate intensity exercise for up to 150 minutes weekly; and  sleep for at least 7 hours a day.  - she is  advised to maintain close follow up with Sharion Balloon, FNP for primary care needs, as  well as her other providers for optimal and coordinated care. - Time spent with the patient: 25 min, of which >50% was spent in reviewing her blood glucose logs , discussing her hypoglycemia and hyperglycemia episodes, reviewing her current and  previous labs / studies and medications  doses and developing a plan to avoid hypoglycemia and hyperglycemia. Please refer to Patient Instructions for Blood Glucose Monitoring and Insulin/Medications Dosing Guide"  in media tab for additional information. [name] participated in the discussions, expressed understanding, and voiced agreement with the above plans.  All questions were answered to her satisfaction. she is encouraged to contact clinic should she have any questions or concerns prior to her return visit.   Follow up plan: - Return in about 3 months (around 05/22/2019) for Meter, and Logs.  Glade Lloyd, MD [facility] [address] [place], [place] [address] Phone: [phone]  Fax: [phone]    02/21/2019, 11:16 AM  This note was partially dictated with voice recognition software. Similar sounding words can be transcribed inadequately or may not  be corrected upon review.

## 2019-02-21 NOTE — Patient Instructions (Signed)

## 2019-02-23 ENCOUNTER — Other Ambulatory Visit: Payer: Self-pay | Admitting: Family

## 2019-02-23 DIAGNOSIS — E1142 Type 2 diabetes mellitus with diabetic polyneuropathy: Secondary | ICD-10-CM

## 2019-03-14 ENCOUNTER — Encounter: Payer: Self-pay | Admitting: Nutrition

## 2019-03-14 ENCOUNTER — Encounter: Payer: Medicare Other | Attending: Family | Admitting: Nutrition

## 2019-03-14 VITALS — Ht 66.0 in | Wt 172.0 lb

## 2019-03-14 DIAGNOSIS — E1165 Type 2 diabetes mellitus with hyperglycemia: Secondary | ICD-10-CM | POA: Insufficient documentation

## 2019-03-14 DIAGNOSIS — E118 Type 2 diabetes mellitus with unspecified complications: Secondary | ICD-10-CM | POA: Diagnosis not present

## 2019-03-14 DIAGNOSIS — IMO0002 Reserved for concepts with insufficient information to code with codable children: Secondary | ICD-10-CM

## 2019-03-14 NOTE — Progress Notes (Signed)
  Medical Nutrition Therapy:  Appt start time: 1000 end time:  1100.   Assessment:  Primary concerns today: Diabetes Type 2. Lives by herself. Is a [employer]. See Dr. Dorris Fetch, Endocrinologist.  FBS. 140-160's in am. 140-160 mg/dl at night. Eats three meals Tresiba 20 units, Januvia 100 mg once a day Walks for exercise.  Eats most of meals at home. She is motivated to continue to eat better and exercise to improve her DM.  Lab Results  Component Value Date   HGBA1C 8.8 (H) 12/21/2018   CMP Latest Ref Rng & Units 12/21/2018 11/20/2018 08/21/2018  Glucose 65 - 99 mg/dL 143(H) 123(H) 134(H)  BUN 8 - 27 mg/dL 15 15 14   Creatinine 0.57 - 1.00 mg/dL 1.01(H) 1.03(H) 0.89  Sodium 134 - 144 mmol/L 141 142 141  Potassium 3.5 - 5.2 mmol/L 4.1 4.8 4.8  Chloride 96 - 106 mmol/L 102 103 102  CO2 20 - 29 mmol/L 24 24 23   Calcium 8.7 - 10.3 mg/dL 10.1 10.1 10.1  Total Protein 6.0 - 8.5 g/dL 7.1 7.5 -  Total Bilirubin 0.0 - 1.2 mg/dL 0.4 0.4 -  Alkaline Phos 39 - 117 IU/L 86 84 -  AST 0 - 40 IU/L 26 27 -  ALT 0 - 32 IU/L 23 23 -   Lipid Panel     Component Value Date/Time   CHOL 135 07/18/2018 1103   TRIG 125 07/18/2018 1103   HDL 43 07/18/2018 1103   CHOLHDL 3.1 07/18/2018 1103   LDLCALC 67 07/18/2018 1103     Preferred Learning Style:   Auditory  No preference indicated   Learning Readiness:     Ready  Change in progress   MEDICATIONS:    DIETARY INTAKE:    24-hr recall:  B ( AM): Boiled egg and 1 slice toast, water Snk ( AM):  Peanuts L ( PM): [place] sandwich, toss salad, water Snk ( PM):  D ( PM): Baked pork chop, roll, cornbread, mixed vegetables, pinto beans, water Snk ( PM): peanuts Beverages: water  Usual physical activity: walks  Estimated energy needs: 1500  calories 170 g carbohydrates 112 g protein 42 g fat  Progress Towards Goal(s):  In progress.   Nutritional Diagnosis:  NB-1.1 Food and nutrition-related knowledge deficit As related to  Diabetes.  As evidenced by A1C 8.8%.    Intervention:  Nutrition and Diabetes education provided on My Plate, CHO counting, meal planning, portion sizes, timing of meals, avoiding snacks between meals unless having a low blood sugar, target ranges for A1C and blood sugars, signs/symptoms and treatment of hyper/hypoglycemia, monitoring blood sugars, taking medications as prescribed, benefits of exercising 30 minutes per day and prevention of complications of DM. Marland KitchenGoals Follow My Plate  - 2-3 carb choices per meal. Continue to eat fresh fruits and vegetables Keep walking Keep drinking water Get A1C to 7% or less   Teaching Method Utilized:  Visual Auditory Hands on  Handouts given during visit include:  The Plate Method   Meal Plan Card  Diabetes Instructions.   Barriers to learning/adherence to lifestyle change: none  Demonstrated degree of understanding via:  Teach Back   Monitoring/Evaluation:  Dietary intake, exercise, and body weight in 1 month(s).

## 2019-03-14 NOTE — Patient Instructions (Addendum)
Goals Follow My Plate  - 2-3 carb choices per meal. Continue to eat fresh fruits and vegetables Keep walking Keep drinking water Get A1C to 7% or less 

## 2019-03-22 ENCOUNTER — Other Ambulatory Visit: Payer: Self-pay

## 2019-03-23 ENCOUNTER — Ambulatory Visit (INDEPENDENT_AMBULATORY_CARE_PROVIDER_SITE_OTHER): Payer: Medicare Other | Admitting: Family

## 2019-03-23 ENCOUNTER — Encounter: Payer: Self-pay | Admitting: Family

## 2019-03-23 VITALS — BP 129/69 | HR 65 | Temp 98.4°F | Ht 66.0 in | Wt 172.0 lb

## 2019-03-23 DIAGNOSIS — K2100 Gastro-esophageal reflux disease with esophagitis, without bleeding: Secondary | ICD-10-CM

## 2019-03-23 DIAGNOSIS — E663 Overweight: Secondary | ICD-10-CM

## 2019-03-23 DIAGNOSIS — I10 Essential (primary) hypertension: Secondary | ICD-10-CM | POA: Diagnosis not present

## 2019-03-23 DIAGNOSIS — Z8673 Personal history of transient ischemic attack (TIA), and cerebral infarction without residual deficits: Secondary | ICD-10-CM

## 2019-03-23 DIAGNOSIS — E1165 Type 2 diabetes mellitus with hyperglycemia: Secondary | ICD-10-CM

## 2019-03-23 DIAGNOSIS — E1142 Type 2 diabetes mellitus with diabetic polyneuropathy: Secondary | ICD-10-CM | POA: Diagnosis not present

## 2019-03-23 DIAGNOSIS — E1169 Type 2 diabetes mellitus with other specified complication: Secondary | ICD-10-CM

## 2019-03-23 DIAGNOSIS — E1159 Type 2 diabetes mellitus with other circulatory complications: Secondary | ICD-10-CM | POA: Diagnosis not present

## 2019-03-23 DIAGNOSIS — K5900 Constipation, unspecified: Secondary | ICD-10-CM

## 2019-03-23 DIAGNOSIS — I152 Hypertension secondary to endocrine disorders: Secondary | ICD-10-CM

## 2019-03-23 DIAGNOSIS — E785 Hyperlipidemia, unspecified: Secondary | ICD-10-CM | POA: Diagnosis not present

## 2019-03-23 DIAGNOSIS — IMO0002 Reserved for concepts with insufficient information to code with codable children: Secondary | ICD-10-CM

## 2019-03-23 LAB — BAYER DCA HB A1C WAIVED: HB A1C (BAYER DCA - WAIVED): 7.2 % — ABNORMAL HIGH (ref <7.0)

## 2019-03-23 NOTE — Patient Instructions (Signed)
Diabetes Mellitus and Nutrition, Adult  When you have diabetes (diabetes mellitus), it is very important to have healthy eating habits because your blood sugar (glucose) levels are greatly affected by what you eat and drink. Eating healthy foods in the appropriate amounts, at about the same times every day, can help you:  · Control your blood glucose.  · Lower your risk of heart disease.  · Improve your blood pressure.  · Reach or maintain a healthy weight.  Every person with diabetes is different, and each person has different needs for a meal plan. Your health care provider may recommend that you work with a diet and nutrition specialist (dietitian) to make a meal plan that is best for you. Your meal plan may vary depending on factors such as:  · The calories you need.  · The medicines you take.  · Your weight.  · Your blood glucose, blood pressure, and cholesterol levels.  · Your activity level.  · Other health conditions you have, such as heart or kidney disease.  How do carbohydrates affect me?  Carbohydrates, also called carbs, affect your blood glucose level more than any other type of food. Eating carbs naturally raises the amount of glucose in your blood. Carb counting is a method for keeping track of how many carbs you eat. Counting carbs is important to keep your blood glucose at a healthy level, especially if you use insulin or take certain oral diabetes medicines.  It is important to know how many carbs you can safely have in each meal. This is different for every person. Your dietitian can help you calculate how many carbs you should have at each meal and for each snack.  Foods that contain carbs include:  · Bread, cereal, rice, pasta, and crackers.  · Potatoes and corn.  · Peas, beans, and lentils.  · Milk and yogurt.  · Fruit and juice.  · Desserts, such as cakes, cookies, ice cream, and candy.  How does alcohol affect me?  Alcohol can cause a sudden decrease in blood glucose (hypoglycemia),  especially if you use insulin or take certain oral diabetes medicines. Hypoglycemia can be a life-threatening condition. Symptoms of hypoglycemia (sleepiness, dizziness, and confusion) are similar to symptoms of having too much alcohol.  If your health care provider says that alcohol is safe for you, follow these guidelines:  · Limit alcohol intake to no more than 1 drink per day for nonpregnant women and 2 drinks per day for men. One drink equals 12 oz of beer, 5 oz of wine, or 1½ oz of hard liquor.  · Do not drink on an empty stomach.  · Keep yourself hydrated with water, diet soda, or unsweetened iced tea.  · Keep in mind that regular soda, juice, and other mixers may contain a lot of sugar and must be counted as carbs.  What are tips for following this plan?    Reading food labels  · Start by checking the serving size on the "Nutrition Facts" label of packaged foods and drinks. The amount of calories, carbs, fats, and other nutrients listed on the label is based on one serving of the item. Many items contain more than one serving per package.  · Check the total grams (g) of carbs in one serving. You can calculate the number of servings of carbs in one serving by dividing the total carbs by 15. For example, if a food has 30 g of total carbs, it would be equal to 2   servings of carbs.  · Check the number of grams (g) of saturated and trans fats in one serving. Choose foods that have low or no amount of these fats.  · Check the number of milligrams (mg) of salt (sodium) in one serving. Most people should limit total sodium intake to less than 2,300 mg per day.  · Always check the nutrition information of foods labeled as "low-fat" or "nonfat". These foods may be higher in added sugar or refined carbs and should be avoided.  · Talk to your dietitian to identify your daily goals for nutrients listed on the label.  Shopping  · Avoid buying canned, premade, or processed foods. These foods tend to be high in fat, sodium,  and added sugar.  · Shop around the outside edge of the grocery store. This includes fresh fruits and vegetables, bulk grains, fresh meats, and fresh dairy.  Cooking  · Use low-heat cooking methods, such as baking, instead of high-heat cooking methods like deep frying.  · Cook using healthy oils, such as olive, canola, or sunflower oil.  · Avoid cooking with butter, cream, or high-fat meats.  Meal planning  · Eat meals and snacks regularly, preferably at the same times every day. Avoid going long periods of time without eating.  · Eat foods high in fiber, such as fresh fruits, vegetables, beans, and whole grains. Talk to your dietitian about how many servings of carbs you can eat at each meal.  · Eat 4-6 ounces (oz) of lean protein each day, such as lean meat, chicken, fish, eggs, or tofu. One oz of lean protein is equal to:  ? 1 oz of meat, chicken, or fish.  ? 1 egg.  ? ¼ cup of tofu.  · Eat some foods each day that contain healthy fats, such as avocado, nuts, seeds, and fish.  Lifestyle  · Check your blood glucose regularly.  · Exercise regularly as told by your health care provider. This may include:  ? 150 minutes of moderate-intensity or vigorous-intensity exercise each week. This could be brisk walking, biking, or water aerobics.  ? Stretching and doing strength exercises, such as yoga or weightlifting, at least 2 times a week.  · Take medicines as told by your health care provider.  · Do not use any products that contain nicotine or tobacco, such as cigarettes and e-cigarettes. If you need help quitting, ask your health care provider.  · Work with a counselor or diabetes educator to identify strategies to manage stress and any emotional and social challenges.  Questions to ask a health care provider  · Do I need to meet with a diabetes educator?  · Do I need to meet with a dietitian?  · What number can I call if I have questions?  · When are the best times to check my blood glucose?  Where to find more  information:  · American Diabetes Association: diabetes.org  · Academy of Nutrition and Dietetics: www.eatright.org  · National Institute of Diabetes and Digestive and Kidney Diseases (NIH): www.niddk.nih.gov  Summary  · A healthy meal plan will help you control your blood glucose and maintain a healthy lifestyle.  · Working with a diet and nutrition specialist (dietitian) can help you make a meal plan that is best for you.  · Keep in mind that carbohydrates (carbs) and alcohol have immediate effects on your blood glucose levels. It is important to count carbs and to use alcohol carefully.  This information is not intended to   replace advice given to you by your health care provider. Make sure you discuss any questions you have with your health care provider.  Document Released: 09/09/2005 Document Revised: 07/13/2017 Document Reviewed: 01/17/2017  Elsevier Interactive Patient Education © 2019 Elsevier Inc.

## 2019-03-23 NOTE — Progress Notes (Signed)
Subjective:    Patient ID: Renee [name], female    DOB: 01/09/1951, 68 y.o.   MRN: [id number]  Chief Complaint  Patient presents with  . Medical Management of Chronic Issues    diabetes   PT presents to the office today for chronic follow up. Pt has hx CVA. She is followed by Diabetic Educator that is helping with her low carb diet.  Hypertension  This is a chronic problem. The current episode started more than 1 year ago. The problem has been resolved since onset. The problem is controlled. Pertinent negatives include no blurred vision, headaches, malaise/fatigue, peripheral edema or shortness of breath. Risk factors for coronary artery disease include sedentary lifestyle, dyslipidemia, obesity and diabetes mellitus. The current treatment provides moderate improvement. Hypertensive end-organ damage includes CVA. There is no history of CAD/MI or heart failure.  Diabetes  She presents for her follow-up diabetic visit. She has type 2 diabetes mellitus. Her disease course has been stable. Pertinent negatives for hypoglycemia include no headaches. Associated symptoms include foot paresthesias. Pertinent negatives for diabetes include no blurred vision and no visual change. There are no hypoglycemic complications. Symptoms are stable. Diabetic complications include a CVA, heart disease and peripheral neuropathy. Pertinent negatives for diabetic complications include no nephropathy. Risk factors for coronary artery disease include dyslipidemia, diabetes mellitus, obesity, hypertension, sedentary lifestyle and post-menopausal. She is following a generally healthy diet. Her overall blood glucose range is 180-200 mg/dl. Eye exam is current.  Gastroesophageal Reflux  She reports no belching, no coughing or no heartburn. This is a chronic problem. The current episode started more than 1 year ago. The problem occurs occasionally. The problem has been waxing and waning. The symptoms are aggravated by certain  foods. She has tried a PPI for the symptoms. The treatment provided moderate relief.  Diabetic Neuropathy  PT complaining of intermittent aching, burning pain of 5 out 10. States the gabapentin helps with this.     Review of Systems  Constitutional: Negative for malaise/fatigue.  Eyes: Negative for blurred vision.  Respiratory: Negative for cough and shortness of breath.   Gastrointestinal: Negative for heartburn.  Neurological: Negative for headaches.  All other systems reviewed and are negative.      Objective:   Physical Exam Vitals signs reviewed.  Constitutional:      General: She is not in acute distress.    Appearance: She is well-developed.  HENT:     Head: Normocephalic and atraumatic.     Right Ear: Tympanic membrane normal.     Left Ear: Tympanic membrane normal.  Eyes:     Pupils: Pupils are equal, round, and reactive to light.  Neck:     Musculoskeletal: Normal range of motion and neck supple.     Thyroid: No thyromegaly.  Cardiovascular:     Rate and Rhythm: Normal rate and regular rhythm.     Heart sounds: Normal heart sounds. No murmur.  Pulmonary:     Effort: Pulmonary effort is normal. No respiratory distress.     Breath sounds: Normal breath sounds. No wheezing.  Abdominal:     General: Bowel sounds are normal. There is no distension.     Palpations: Abdomen is soft.     Tenderness: There is no abdominal tenderness.  Musculoskeletal: Normal range of motion.        General: No tenderness.  Skin:    General: Skin is warm and dry.  Neurological:     Mental Status: She is alert and oriented  to person, place, and time.     Cranial Nerves: No cranial nerve deficit.     Deep Tendon Reflexes: Reflexes are normal and symmetric.  Psychiatric:        Behavior: Behavior normal.        Thought Content: Thought content normal.        Judgment: Judgment normal.       BP 129/69   Pulse 65   Temp 98.4 F (36.9 C) (Oral)   Ht _0  (1.676 m)   Wt 172 lb  (78 kg)   BMI 27.76 kg/m      Assessment & Plan:  Renee [name] comes in today with chief complaint of Medical Management of Chronic Issues (diabetes)   Diagnosis and orders addressed:  1. Hypertension associated with diabetes ([place]) - CMP14+EGFR - CBC with Differential/Platelet  2. Gastroesophageal reflux disease with esophagitis - CMP14+EGFR - CBC with Differential/Platelet  3. Hyperlipidemia associated with type 2 diabetes mellitus ([place]) - CMP14+EGFR - CBC with Differential/Platelet - Lipid panel  4. Uncontrolled type 2 diabetes mellitus, without long-term current use of insulin ([facility]) - Bayer DCA Hb A1c Waived - CMP14+EGFR - CBC with Differential/Platelet  5. Diabetic peripheral neuropathy ([facility]) - CMP14+EGFR - CBC with Differential/Platelet  6. Constipation, unspecified constipation type - CMP14+EGFR - CBC with Differential/Platelet  7. History of CVA (cerebrovascular accident) - CMP14+EGFR - CBC with Differential/Platelet  8. Overweight (BMI 25.0-29.9) - CMP14+EGFR - CBC with Differential/Platelet   Labs pending Health Maintenance reviewed Diet and exercise encouraged  Follow up plan: 3 months    Evelina Dun, FNP

## 2019-03-24 LAB — CMP14+EGFR
ALT: 21 IU/L (ref 0–32)
AST: 26 IU/L (ref 0–40)
Albumin/Globulin Ratio: 1.5 (ref 1.2–2.2)
Albumin: 4.3 g/dL (ref 3.8–4.8)
Alkaline Phosphatase: 84 IU/L (ref 39–117)
BUN/Creatinine Ratio: 17 (ref 12–28)
BUN: 17 mg/dL (ref 8–27)
Bilirubin Total: 0.3 mg/dL (ref 0.0–1.2)
CO2: 21 mmol/L (ref 20–29)
Calcium: 9.8 mg/dL (ref 8.7–10.3)
Chloride: 103 mmol/L (ref 96–106)
Creatinine, Ser: 0.98 mg/dL (ref 0.57–1.00)
GFR calc Af Amer: 68 mL/min/{1.73_m2} (ref >59)
GFR calc non Af Amer: 59 mL/min/{1.73_m2} — ABNORMAL LOW (ref >59)
Globulin, Total: 2.9 g/dL (ref 1.5–4.5)
Glucose: 113 mg/dL — ABNORMAL HIGH (ref 65–99)
Potassium: 4.5 mmol/L (ref 3.5–5.2)
Sodium: 140 mmol/L (ref 134–144)
TOTAL PROTEIN: 7.2 g/dL (ref 6.0–8.5)

## 2019-03-24 LAB — CBC WITH DIFFERENTIAL/PLATELET
BASOS ABS: 0.1 10*3/uL (ref 0.0–0.2)
Basos: 1 %
EOS (ABSOLUTE): 0.1 10*3/uL (ref 0.0–0.4)
Eos: 2 %
HEMOGLOBIN: 13.1 g/dL (ref 11.1–15.9)
Hematocrit: 40.6 % (ref 34.0–46.6)
IMMATURE GRANS (ABS): 0 10*3/uL (ref 0.0–0.1)
Immature Granulocytes: 0 %
LYMPHS: 31 %
Lymphocytes Absolute: 1.7 10*3/uL (ref 0.7–3.1)
MCH: 28.9 pg (ref 26.6–33.0)
MCHC: 32.3 g/dL (ref 31.5–35.7)
MCV: 90 fL (ref 79–97)
MONOCYTES: 10 %
Monocytes Absolute: 0.5 10*3/uL (ref 0.1–0.9)
NEUTROS ABS: 3.1 10*3/uL (ref 1.4–7.0)
Neutrophils: 56 %
Platelets: 361 10*3/uL (ref 150–450)
RBC: 4.53 x10E6/uL (ref 3.77–5.28)
RDW: 12.3 % (ref 11.7–15.4)
WBC: 5.4 10*3/uL (ref 3.4–10.8)

## 2019-03-24 LAB — LIPID PANEL
Chol/HDL Ratio: 2.9 ratio (ref 0.0–4.4)
Cholesterol, Total: 120 mg/dL (ref 100–199)
HDL: 41 mg/dL (ref >39)
LDL Calculated: 69 mg/dL (ref 0–99)
Triglycerides: 52 mg/dL (ref 0–149)
VLDL Cholesterol Cal: 10 mg/dL (ref 5–40)

## 2019-03-27 ENCOUNTER — Other Ambulatory Visit: Payer: Self-pay | Admitting: Family

## 2019-03-27 DIAGNOSIS — E1142 Type 2 diabetes mellitus with diabetic polyneuropathy: Secondary | ICD-10-CM

## 2019-03-28 ENCOUNTER — Telehealth: Payer: Self-pay | Admitting: Family

## 2019-03-28 NOTE — Telephone Encounter (Signed)
Aware.  Two boxes  of samples of baby aspirin are ready for patient at front.

## 2019-03-28 NOTE — Telephone Encounter (Signed)
Patient calling back wants a reply today. Her PCP is off.

## 2019-04-26 ENCOUNTER — Other Ambulatory Visit: Payer: Self-pay | Admitting: Family

## 2019-04-26 DIAGNOSIS — E1142 Type 2 diabetes mellitus with diabetic polyneuropathy: Secondary | ICD-10-CM

## 2019-05-08 DIAGNOSIS — B351 Tinea unguium: Secondary | ICD-10-CM | POA: Diagnosis not present

## 2019-05-08 DIAGNOSIS — M79676 Pain in unspecified toe(s): Secondary | ICD-10-CM | POA: Diagnosis not present

## 2019-05-08 DIAGNOSIS — L84 Corns and callosities: Secondary | ICD-10-CM | POA: Diagnosis not present

## 2019-05-08 DIAGNOSIS — E1151 Type 2 diabetes mellitus with diabetic peripheral angiopathy without gangrene: Secondary | ICD-10-CM | POA: Diagnosis not present

## 2019-05-11 ENCOUNTER — Other Ambulatory Visit: Payer: Self-pay | Admitting: Family

## 2019-05-11 DIAGNOSIS — E785 Hyperlipidemia, unspecified: Secondary | ICD-10-CM

## 2019-05-11 DIAGNOSIS — E1169 Type 2 diabetes mellitus with other specified complication: Secondary | ICD-10-CM

## 2019-05-15 ENCOUNTER — Other Ambulatory Visit: Payer: Self-pay

## 2019-05-15 ENCOUNTER — Other Ambulatory Visit: Payer: BC Managed Care – PPO

## 2019-05-15 ENCOUNTER — Other Ambulatory Visit: Payer: Self-pay | Admitting: "Endocrinology

## 2019-05-18 ENCOUNTER — Telehealth: Payer: Self-pay | Admitting: Family

## 2019-05-18 MED ORDER — GLUCOSE BLOOD VI STRP: ORAL_STRIP | 3 refills | Status: DC

## 2019-05-18 NOTE — Telephone Encounter (Signed)
Resent Prescription to pharmacy

## 2019-05-22 ENCOUNTER — Encounter: Payer: BC Managed Care – PPO | Attending: Family | Admitting: Nutrition

## 2019-05-22 ENCOUNTER — Ambulatory Visit (INDEPENDENT_AMBULATORY_CARE_PROVIDER_SITE_OTHER): Payer: Medicare Other | Admitting: "Endocrinology

## 2019-05-22 ENCOUNTER — Encounter: Payer: Self-pay | Admitting: Nutrition

## 2019-05-22 ENCOUNTER — Encounter: Payer: Self-pay | Admitting: "Endocrinology

## 2019-05-22 ENCOUNTER — Other Ambulatory Visit: Payer: Self-pay

## 2019-05-22 VITALS — Ht 66.0 in | Wt 173.0 lb

## 2019-05-22 VITALS — BP 144/77 | HR 68 | Ht 66.0 in | Wt 173.0 lb

## 2019-05-22 DIAGNOSIS — E782 Mixed hyperlipidemia: Secondary | ICD-10-CM | POA: Diagnosis not present

## 2019-05-22 DIAGNOSIS — E1165 Type 2 diabetes mellitus with hyperglycemia: Secondary | ICD-10-CM | POA: Diagnosis not present

## 2019-05-22 DIAGNOSIS — E118 Type 2 diabetes mellitus with unspecified complications: Secondary | ICD-10-CM | POA: Insufficient documentation

## 2019-05-22 DIAGNOSIS — I10 Essential (primary) hypertension: Secondary | ICD-10-CM

## 2019-05-22 DIAGNOSIS — IMO0002 Reserved for concepts with insufficient information to code with codable children: Secondary | ICD-10-CM

## 2019-05-22 NOTE — Patient Instructions (Signed)

## 2019-05-22 NOTE — Progress Notes (Signed)
Endocrinology follow-up Note       05/22/2019, 1:12 PM   Subjective:    Patient ID: Renee [name], female    DOB: Feb 10, 1951.  Renee [name] is being seen in follow-up for the management of her currently uncontrolled type 2 diabetes, hyperlipidemia, hypertension. PCP:   Sharion Balloon, FNP.   Past Medical History:  Diagnosis Date  . Arthritis   . Breast cancer ([place]) 2001   right mastectomy  . CVA (cerebral infarction) 2003, 2005  . DM (diabetes mellitus) ([place])   . GERD (gastroesophageal reflux disease)   . Heart murmur   . Hyperlipidemia   . Hypertension   . Seizures ([place])   . Vitamin D deficiency    Past Surgical History:  Procedure Laterality Date  . ABDOMINAL HYSTERECTOMY  2000  . GANGLION CYST EXCISION  2010   right (2010)  and left (before 2010) hand  . MASTECTOMY  2001   right  . TONSILECTOMY/ADENOIDECTOMY WITH MYRINGOTOMY Bilateral    age 2's   Social History   Socioeconomic History  . Marital status: Single    Spouse name: Not on file  . Number of children: Not on file  . Years of education: Not on file  . Highest education level: Not on file  Occupational History  . Not on file  Social Needs  . Financial resource strain: Not on file  . Food insecurity:    Worry: Not on file    Inability: Not on file  . Transportation needs:    Medical: Not on file    Non-medical: Not on file  Tobacco Use  . Smoking status: Never Smoker  . Smokeless tobacco: Never Used  Substance and Sexual Activity  . Alcohol use: Not Currently    Alcohol/week: 1.0 standard drinks    Types: 1 Glasses of wine per week  . Drug use: No  . Sexual activity: Yes    Birth control/protection: Post-menopausal  Lifestyle  . Physical activity:    Days per week: Not on file    Minutes per session: Not on file  . Stress: Not on file  Relationships  . Social connections:    Talks on phone: Not on  file    Gets together: Not on file    Attends religious service: Not on file    Active member of club or organization: Not on file    Attends meetings of clubs or organizations: Not on file    Relationship status: Not on file  Other Topics Concern  . Not on file  Social History Narrative  . Not on file   Outpatient Encounter Medications as of 05/22/2019  Medication Sig  . aspirin 81 MG tablet Take 81 mg by mouth daily.  . bisoprolol-hydrochlorothiazide (ZIAC) 2.5-6.25 MG tablet TAKE 1 TABLET BY MOUTH ONCE DAILY  . Blood Glucose Calibration (ONETOUCH VERIO) SOLN Use as needed to calibrate glucometer  . Blood Glucose Monitoring Suppl ([place]) w/Device KIT Use to check blood sugar twice daily  . docusate sodium (COLACE) 100 MG capsule Take 100 mg by mouth daily.  Marland Kitchen gabapentin (NEURONTIN) 400 MG capsule  TAKE 1 CAPSULE BY MOUTH THREE TIMES DAILY  . glucose blood (ONETOUCH VERIO) test strip Use to check blood sugar twice daily  . insulin degludec (TRESIBA FLEXTOUCH) 100 UNIT/ML SOPN FlexTouch Pen Inject 0.2 mLs (20 Units total) into the skin at bedtime.  . Multiple Vitamin (MULTIVITAMIN WITH MINERALS) TABS tablet Take 1 tablet by mouth daily.  Marland Kitchen omeprazole (PRILOSEC) 20 MG capsule Take 1 capsule (20 mg total) by mouth daily.  [name] DELICA LANCETS 03E MISC Use to check BG up to bid.  Dx: E11.65 type 2 DM with insulin therapy, uncontrolled  . simvastatin (ZOCOR) 40 MG tablet TAKE 1 TABLET BY MOUTH AT BEDTIME  . sitaGLIPtin (JANUVIA) 100 MG tablet Take 100 mg by mouth daily.  . Vitamin D, Ergocalciferol, (DRISDOL) 1.25 MG (50000 UT) CAPS capsule Take 1 capsule (50,000 Units total) by mouth every 7 (seven) days.   No facility-administered encounter medications on file as of 05/22/2019.     ALLERGIES: Allergies  Allergen Reactions  . Metformin And Related Other (See Comments)    seizures  . Penicillins     VACCINATION STATUS:  There is no immunization history on  file for this patient.  Diabetes  She presents for her follow-up diabetic visit. She has type 2 diabetes mellitus. Onset time: Patient says she was diagnosed last year, however her A1c has been high enough for diabetes at least since 2015.  Have A1c is as high as greater than 14%. Her disease course has been improving. There are no hypoglycemic associated symptoms. Pertinent negatives for hypoglycemia include no confusion, headaches, pallor or seizures. Pertinent negatives for diabetes include no chest pain, no fatigue, no polydipsia, no polyphagia and no polyuria. There are no hypoglycemic complications. Symptoms are improving. Diabetic complications include peripheral neuropathy. Risk factors for coronary artery disease include dyslipidemia, diabetes mellitus, family history, hypertension, sedentary lifestyle and post-menopausal. Current diabetic treatments: She is currently on Tresiba 23 units nightly, glipizide 10 mg p.o. twice daily, Januvia 100 mg p.o. once a day.  She does not tolerate metformin. She is compliant with treatment most of the time. Her weight is stable. She is following a generally unhealthy diet. When asked about meal planning, she reported none. She has not had a previous visit with a dietitian. She never participates in exercise. Her home blood glucose trend is fluctuating minimally. Her breakfast blood glucose range is generally 140-180 mg/dl. Her bedtime blood glucose range is generally 140-180 mg/dl. Her overall blood glucose range is 140-180 mg/dl. An ACE inhibitor/angiotensin II receptor blocker is not being taken.  Hyperlipidemia  This is a chronic problem. The current episode started more than 1 year ago. The problem is controlled. Exacerbating diseases include diabetes. Pertinent negatives include no chest pain, myalgias or shortness of breath. Current antihyperlipidemic treatment includes statins. Risk factors for coronary artery disease include diabetes mellitus, dyslipidemia,  hypertension, a sedentary lifestyle and post-menopausal.  Hypertension  This is a chronic problem. The current episode started more than 1 year ago. The problem is controlled. Pertinent negatives include no chest pain, headaches, palpitations or shortness of breath. Risk factors for coronary artery disease include dyslipidemia, diabetes mellitus, sedentary lifestyle, family history and post-menopausal state. Past treatments include beta blockers.     Review of Systems  Constitutional: Negative for chills, fatigue, fever and unexpected weight change.  HENT: Negative for trouble swallowing and voice change.   Eyes: Negative for visual disturbance.  Respiratory: Negative for cough, shortness of breath and wheezing.   Cardiovascular: Negative  for chest pain, palpitations and leg swelling.  Gastrointestinal: Negative for diarrhea, nausea and vomiting.  Endocrine: Negative for cold intolerance, heat intolerance, polydipsia, polyphagia and polyuria.  Musculoskeletal: Negative for arthralgias and myalgias.  Skin: Negative for color change, pallor, rash and wound.  Neurological: Negative for seizures and headaches.  Psychiatric/Behavioral: Negative for confusion and suicidal ideas.    Objective:    BP (!) 144/77   Pulse 68   Ht '5\' 6"'  (1.676 m)   Wt 173 lb (78.5 kg)   BMI 27.92 kg/m   Wt Readings from Last 3 Encounters:  05/22/19 173 lb (78.5 kg)  05/22/19 173 lb (78.5 kg)  03/23/19 172 lb (78 kg)     Physical Exam Constitutional:      Appearance: She is well-developed.  HENT:     Head: Normocephalic and atraumatic.  Neck:     Musculoskeletal: Normal range of motion and neck supple.     Thyroid: No thyromegaly.     Trachea: No tracheal deviation.  Pulmonary:     Effort: Pulmonary effort is normal.  Abdominal:     Palpations: Abdomen is soft.     Tenderness: There is no abdominal tenderness. There is no guarding.  Musculoskeletal: Normal range of motion.  Skin:    General: Skin  is warm and dry.     Coloration: Skin is not pale.     Findings: No erythema or rash.  Neurological:     Mental Status: She is alert and oriented to person, place, and time.     Cranial Nerves: No cranial nerve deficit.     Coordination: Coordination normal.     Deep Tendon Reflexes: Reflexes are normal and symmetric.  Psychiatric:        Judgment: Judgment normal.       CMP ( most recent) CMP     Component Value Date/Time   NA 140 03/23/2019 1113   K 4.5 03/23/2019 1113   CL 103 03/23/2019 1113   CO2 21 03/23/2019 1113   GLUCOSE 113 (H) 03/23/2019 1113   GLUCOSE 185 (H) 01/15/2016 0046   BUN 17 03/23/2019 1113   CREATININE 0.98 03/23/2019 1113   CALCIUM 9.8 03/23/2019 1113   PROT 7.2 03/23/2019 1113   ALBUMIN 4.3 03/23/2019 1113   AST 26 03/23/2019 1113   ALT 21 03/23/2019 1113   ALKPHOS 84 03/23/2019 1113   BILITOT 0.3 03/23/2019 1113   GFRNONAA 59 (L) 03/23/2019 1113   GFRAA 68 03/23/2019 1113     Diabetic Labs (most recent): Lab Results  Component Value Date   HGBA1C 7.2 (H) 03/23/2019   HGBA1C 8.8 (H) 12/21/2018   HGBA1C 8.7 (H) 11/20/2018     Lipid Panel ( most recent) Lipid Panel     Component Value Date/Time   CHOL 120 03/23/2019 1113   TRIG 52 03/23/2019 1113   HDL 41 03/23/2019 1113   CHOLHDL 2.9 03/23/2019 1113   LDLCALC 69 03/23/2019 1113      Lab Results  Component Value Date   TSH 2.710 07/05/2016       Assessment & Plan:   1. Uncontrolled type 2 diabetes mellitus, without long-term current use of insulin ([place])  - Renee [name] has currently uncontrolled symptomatic type 2 DM since approximately age of 68 years of age. -Her previsit labs show A1c of 7.2% improving from 8.8%. - Recent labs reviewed.  -She returns with improved glycemic profile both fasting and postprandial.   -her diabetes is complicated by peripheral neuropathy  and she remains at a high risk for more acute and chronic complications which include CAD, CVA, CKD,  retinopathy, and neuropathy. These are all discussed in detail with her.  - I have counseled her on diet management by adopting a carbohydrate restricted/protein rich diet.  - Patient admits there is a room for improvement in her diet and drink choices. - Patient admits there is a room for improvement in her diet and drink choices. -  Suggestion is made for her to avoid simple carbohydrates  from her diet including Cakes, Sweet Desserts / Pastries, Ice Cream, Soda (diet and regular), Sweet Tea, Candies, Chips, Cookies, Store Bought Juices, Alcohol in Excess of  1-2 drinks a day, Artificial Sweeteners, and "Sugar-free" Products. This will help patient to have stable blood glucose profile and potentially avoid unintended weight gain.   - I encouraged her to switch to  unprocessed or minimally processed complex starch and increased protein intake (animal or plant source), fruits, and vegetables.  - she is advised to stick to a routine mealtimes to eat 3 meals  a day and avoid unnecessary snacks ( to snack only to correct hypoglycemia).   - she has been scheduled  with Jearld Fenton, RDN, CDE for individualized diabetes education.  - I have approached her with the following individualized plan to manage diabetes and patient agrees:   -After her recent initiation with basal insulin, her glycemic profile continues to improve towards target.   -She is advised to continue Tresiba 20 units nightly, continue to monitor blood glucose 2 times a day-daily before breakfast and at bedtime.   - she is encouraged to call clinic for blood glucose levels less than 70 or above 300 mg /dl. -She reports significant intolerance to metformin and does not wish to resume. -She is advised to continue Januvia 100 mg p.o. daily with breakfast   -She is not a candidate for SGLT2 inhibitors nor GLP-1 receptor agonist.   - Patient specific target  A1c;  LDL, HDL, Triglycerides, and  Waist Circumference were discussed  in detail.  2) Blood Pressure /Hypertension:  her blood pressure is not controlled to target.  She is advised to continue  her current medications including bisoprolol-hydrochlorothiazide 12.5-6.25 p.o. twice daily.   3) Lipids/Hyperlipidemia:   Review of her recent lipid panel showed  controlled  LDL at 67 .  she  is advised to continue simvastatin 40 mg p.o. daily at bedtime.  Side effects and precautions discussed with her.  4)  Weight/Diet:  Body mass index is 27.92 kg/m.  -She is not a candidate for weight loss.  I discussed with her the fact that loss of 5 - 10% of her  current body weight will have the most impact on her diabetes management.  CDE Consult will be initiated . Exercise, and detailed carbohydrates information provided  -  detailed on discharge instructions.  5) Chronic Care/Health Maintenance:  -she  is on statin medications and  is encouraged to initiate and continue to follow up with Ophthalmology, Dentist,  Podiatrist at least yearly or according to recommendations, and advised to  stay away from smoking. I have recommended yearly flu vaccine and pneumonia vaccine at least every 5 years; moderate intensity exercise for up to 150 minutes weekly; and  sleep for at least 7 hours a day.  - she is  advised to maintain close follow up with Sharion Balloon, FNP for primary care needs, as well as her other providers for optimal and coordinated  care. - Time spent with the patient: 25 min, of which >50% was spent in reviewing her blood glucose logs , discussing her hypoglycemia and hyperglycemia episodes, reviewing her current and  previous labs / studies and medications  doses and developing a plan to avoid hypoglycemia and hyperglycemia. Please refer to Patient Instructions for Blood Glucose Monitoring and Insulin/Medications Dosing Guide"  in media tab for additional information. Please  also refer to " Patient Self Inventory" in the Media  tab for reviewed elements of pertinent  patient history.  [name] participated in the discussions, expressed understanding, and voiced agreement with the above plans.  All questions were answered to her satisfaction. she is encouraged to contact clinic should she have any questions or concerns prior to her return visit.   Follow up plan: - Return in about 4 months (around 09/22/2019) for Follow up with Pre-visit Labs, Meter, and Logs.  Glade Lloyd, MD [facility] [address] [place], [place] [address] Phone: [phone]  Fax: [phone]    05/22/2019, 1:12 PM  This note was partially dictated with voice recognition software. Similar sounding words can be transcribed inadequately or may not  be corrected upon review.

## 2019-05-22 NOTE — Patient Instructions (Signed)
Goals Follow My Plate  - 2-3 carb choices per meal. Continue to eat fresh fruits and vegetables Keep walking Keep drinking water Get A1C to 7% or less

## 2019-05-22 NOTE — Progress Notes (Signed)
Telephone visit. Medical Nutrition Therapy:  Appt start time: 1130 end time:  3329.  Assessment:  Primary concerns today: Diabetes Type 2. Lives by herself. Is a [employer]. See Dr. Dorris Fetch, Endocrinologist. A1C improved from 8.8% down to 7.2%. No low blood sugars reported.  FBS. 140-160's in am. 140-160 mg/dl at night.Drinking water. Avoiding snacks. Eats three meals Tresiba 20 units, Januvia 100 mg once a day Walks inside and walks in [place]. Eats most of meals at home. She is motivated to continue to eat better and exercise to improve her DM. Making excellent progress..  Lab Results  Component Value Date   HGBA1C 7.2 (H) 03/23/2019   CMP Latest Ref Rng & Units 03/23/2019 12/21/2018 11/20/2018  Glucose 65 - 99 mg/dL 113(H) 143(H) 123(H)  BUN 8 - 27 mg/dL 17 15 15   Creatinine 0.57 - 1.00 mg/dL 0.98 1.01(H) 1.03(H)  Sodium 134 - 144 mmol/L 140 141 142  Potassium 3.5 - 5.2 mmol/L 4.5 4.1 4.8  Chloride 96 - 106 mmol/L 103 102 103  CO2 20 - 29 mmol/L 21 24 24   Calcium 8.7 - 10.3 mg/dL 9.8 10.1 10.1  Total Protein 6.0 - 8.5 g/dL 7.2 7.1 7.5  Total Bilirubin 0.0 - 1.2 mg/dL 0.3 0.4 0.4  Alkaline Phos 39 - 117 IU/L 84 86 84  AST 0 - 40 IU/L 26 26 27   ALT 0 - 32 IU/L 21 23 23    Lipid Panel     Component Value Date/Time   CHOL 120 03/23/2019 1113   TRIG 52 03/23/2019 1113   HDL 41 03/23/2019 1113   CHOLHDL 2.9 03/23/2019 1113   LDLCALC 69 03/23/2019 1113     Preferred Learning Style:   Auditory  No preference indicated   Learning Readiness:     Ready  Change in progress   MEDICATIONS:    DIETARY INTAKE:    24-hr recall:  B ( AM): Boiled egg and 1 slice toast, fruit or yogurt ,water Snk ( AM):  Peanuts L ( PM): Grilled chicken sandwich, onion rings, water, slaw 1/2 c Snk ( PM):  D ( PM): Chicken, toss salad, water, yogurt Snk ( PM): Beverages: water  Usual physical activity: walks  Estimated energy needs: 1500  calories 170 g carbohydrates 112 g  protein 42 g fat  Progress Towards Goal(s):  In progress.   Nutritional Diagnosis:  NB-1.1 Food and nutrition-related knowledge deficit As related to Diabetes.  As evidenced by A1C 8.8%.    Intervention:  Nutrition and Diabetes education provided on My Plate, CHO counting, meal planning, portion sizes, timing of meals, avoiding snacks between meals unless having a low blood sugar, target ranges for A1C and blood sugars, signs/symptoms and treatment of hyper/hypoglycemia, monitoring blood sugars, taking medications as prescribed, benefits of exercising 30 minutes per day and prevention of complications of DM. Marland KitchenGoals Follow My Plate  - 2-3 carb choices per meal. Continue to eat fresh fruits and vegetables Keep walking Keep drinking water Get A1C to 7% or less   Teaching Method Utilized:  Visual Auditory Hands on  Handouts given during visit include:  The Plate Method   Meal Plan Card  Diabetes Instructions.   Barriers to learning/adherence to lifestyle change: none  Demonstrated degree of understanding via:  Teach Back   Monitoring/Evaluation:  Dietary intake, exercise, and body weight .

## 2019-05-30 ENCOUNTER — Telehealth: Payer: Self-pay | Admitting: Family

## 2019-06-05 ENCOUNTER — Ambulatory Visit (INDEPENDENT_AMBULATORY_CARE_PROVIDER_SITE_OTHER): Payer: Medicare Other | Admitting: *Deleted

## 2019-06-05 ENCOUNTER — Encounter: Payer: Self-pay | Admitting: *Deleted

## 2019-06-05 ENCOUNTER — Other Ambulatory Visit: Payer: Self-pay

## 2019-06-05 DIAGNOSIS — Z0000 Encounter for general adult medical examination without abnormal findings: Secondary | ICD-10-CM | POA: Diagnosis not present

## 2019-06-05 NOTE — Patient Instructions (Signed)
  Ms. Birchard , Thank you for taking time to talk with me for your Medicare Wellness Visit. I appreciate your ongoing commitment to your health goals. Please review the following plan we discussed and let me know if I can assist you in the future.   These are the goals we discussed: Goals    . HEMOGLOBIN A1C < 7.0       This is a list of the screening recommended for you and due dates:  Health Maintenance  Topic Date Due  . Pneumonia vaccines (1 of 2 - PCV13) 03/02/2015  . Complete foot exam   07/19/2019  . Eye exam for diabetics  09/22/2019  . Hemoglobin A1C  09/23/2019  . Mammogram  04/25/2020  . Colon Cancer Screening  08/17/2022  . Tetanus Vaccine  12/27/2022  . DEXA scan (bone density measurement)  Completed  .  Hepatitis C: One time screening is recommended by Center for Disease Control  (CDC) for  adults born from [birth year] through 1966.   Completed  . Flu Shot  Discontinued

## 2019-06-05 NOTE — Progress Notes (Addendum)
MEDICARE ANNUAL WELLNESS VISIT  06/05/2019  Telephone Visit Disclaimer This Medicare AWV was conducted by telephone due to national recommendations for restrictions regarding the COVID-19 Pandemic (e.g. social distancing).  I verified, using two identifiers, that I am speaking with Renee [name] or their authorized healthcare agent. I discussed the limitations, risks, security, and privacy concerns of performing an evaluation and management service by telephone and the potential availability of an in-person appointment in the future. The patient expressed understanding and agreed to proceed.   Subjective:  Renee [name] is a 68 y.o. female patient of Hawks, Theador Hawthorne, FNP who had a Medicare Annual Wellness Visit today via telephone. Chinara is retired from [employer], and lives alone. She has never been married, and does not have children. she reports that she is socially active and does interact with friends/family regularly. she is moderately physically active walking about 3-4 days per week.  She enjoys being involved in her church, cooking, shopping, and listening to music.  Patient Care Team: Sharion Balloon, FNP as PCP - General (Nurse Practitioner) Tat, Eustace Quail, DO as Consulting Physician (Neurology) Pyrtle, Lajuan Lines, MD as Consulting Physician (Gastroenterology) Cassandria Anger, MD as Consulting Physician (Endocrinology)  Advanced Directives 06/05/2019 10/30/2018 07/14/2017 07/05/2016 01/14/2016  Does Patient Have a Medical Advance Directive? No No Yes No No  Type of Advance Directive - - [place] - -  Does patient want to make changes to medical advance directive? - - No - Patient declined - -  Copy of [place] in Chart? - - Yes - -  Would patient like information on creating a medical advance directive? Yes (MAU/Ambulatory/Procedural Areas - Information given) No - Patient declined - Yes -  Educational materials given No - patient declined information    Hospital Utilization Over the Past 12 Months: # of hospitalizations or ER visits: 1 ER visit for muscle strain # of surgeries: 0  Review of Systems    Patient reports that her overall health is better compared to last year.    Review of Systems:   All systems negative - reported by patient.  Pain Assessment Pain Score: 0-No pain     Current Medications & Allergies (verified) Allergies as of 06/05/2019      Reactions   Metformin And Related Other (See Comments)   seizures   Penicillins       Medication List       Accurate as of June 05, 2019 11:23 AM. If you have any questions, ask your nurse or doctor.        aspirin 81 MG tablet Take 81 mg by mouth daily.   bisoprolol-hydrochlorothiazide 2.5-6.25 MG tablet Commonly known as:  ZIAC TAKE 1 TABLET BY MOUTH ONCE DAILY   docusate sodium 100 MG capsule Commonly known as:  COLACE Take 100 mg by mouth daily.   gabapentin 400 MG capsule Commonly known as:  NEURONTIN TAKE 1 CAPSULE BY MOUTH THREE TIMES DAILY   glucose blood test strip Commonly known as:  OneTouch Verio Use to check blood sugar twice daily   insulin degludec 100 UNIT/ML Sopn FlexTouch Pen Commonly known as:  Tyler Aas FlexTouch Inject 0.2 mLs (20 Units total) into the skin at bedtime.   multivitamin with minerals Tabs tablet Take 1 tablet by mouth daily.   omeprazole 20 MG capsule Commonly known as:  PRILOSEC Take 1 capsule (20 mg total) by mouth daily.  OneTouch Delica Lancets 66Y Misc Use to check BG up to bid.  Dx: E11.65 type 2 DM with insulin therapy, uncontrolled   OneTouch Verio Flex System w/Device Kit Use to check blood sugar twice daily   OneTouch Verio Soln Use as needed to calibrate glucometer   simvastatin 40 MG tablet Commonly known as:  ZOCOR TAKE 1 TABLET BY MOUTH AT BEDTIME   sitaGLIPtin 100 MG tablet Commonly known as:  JANUVIA Take 100 mg by mouth  daily.   Vitamin D (Ergocalciferol) 1.25 MG (50000 UT) Caps capsule Commonly known as:  DRISDOL Take 1 capsule (50,000 Units total) by mouth every 7 (seven) days.       History (reviewed): Past Medical History:  Diagnosis Date  . Arthritis   . Breast cancer ([place]) 2001   right mastectomy  . CVA (cerebral infarction) 2003, 2005  . DM (diabetes mellitus) ([place])   . GERD (gastroesophageal reflux disease)   . Heart murmur   . Hyperlipidemia   . Hypertension   . Seizures ([place])   . Vitamin D deficiency    Past Surgical History:  Procedure Laterality Date  . ABDOMINAL HYSTERECTOMY  2000  . GANGLION CYST EXCISION  2010   right (2010)  and left (before 2010) hand  . MASTECTOMY  2001   right  . TONSILECTOMY/ADENOIDECTOMY WITH MYRINGOTOMY Bilateral    age 35's   Family History  Problem Relation Age of Onset  . Alzheimer's disease Mother   . Parkinson's disease Mother   . Heart Problems Father   . Kidney disease Father        +dialysis  . Heart disease Father   . Other Sister        +TAH for fibroids  . Cancer Paternal Aunt        NOS cancer  . Breast cancer Sister        dx. 66-56  . Renal cancer Sister        dx. 62-63; not a smoker  . Early death Brother   . Diabetes Brother   . Colon cancer Neg Hx   . Stomach cancer Neg Hx    Social History   Socioeconomic History  . Marital status: Single    Spouse name: Not on file  . Number of children: Not on file  . Years of education: 47  . Highest education level: Associate degree: academic program  Occupational History  . Occupation: Retired    Fish farm manager: [employer]   Social Needs  . Financial resource strain: Not hard at all  . Food insecurity:    Worry: Never true    Inability: Never true  . Transportation needs:    Medical: No    Non-medical: No  Tobacco Use  . Smoking status: Never Smoker  . Smokeless tobacco: Never Used  Substance and Sexual  Activity  . Alcohol use: Not Currently    Alcohol/week: 1.0 standard drinks    Types: 1 Glasses of wine per week  . Drug use: No  . Sexual activity: Yes    Birth control/protection: Post-menopausal  Lifestyle  . Physical activity:    Days per week: 3 days    Minutes per session: 60 min  . Stress: Only a little  Relationships  . Social connections:    Talks on phone: More than three times a week    Gets together: More than three times a week    Attends religious service: More  than 4 times per year    Active member of club or organization: Yes    Attends meetings of clubs or organizations: More than 4 times per year    Relationship status: Never married  Other Topics Concern  . Not on file  Social History Narrative  . Not on file    Activities of Daily Living In your present state of health, do you have any difficulty performing the following activities: 06/05/2019  Hearing? N  Vision? N  Difficulty concentrating or making decisions? N  Walking or climbing stairs? N  Dressing or bathing? N  Doing errands, shopping? 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 N  Using the Toilet? N  In the past six months, have you accidently leaked urine? N  Do you have problems with loss of bowel control? N  Managing your Medications? N  Managing your Finances? N  Housekeeping or managing your Housekeeping? N  Some recent data might be hidden        Exercise    Patient walks 3-4 days per week for 1 hour  Diet Patient reports consuming 3 meals a day and 1 snack(s) a day Patient reports that her primary diet is: Diabetic Patient reports that she does have regular access to food.   Depression Screen PHQ 2/9 Scores 06/05/2019 05/22/2019 03/23/2019 03/14/2019 11/20/2018 10/30/2018 10/14/2017  PHQ - 2 Score 0 0 0 0 0 0 0     Fall Risk Fall Risk  06/05/2019 05/22/2019 03/23/2019 03/14/2019 02/15/2019  Falls in the past year? 0 0 0 0 0  Follow up Falls  prevention discussed - - - Falls evaluation completed     Objective:  MONQUE HAGGAR seemed alert and oriented and she participated appropriately during our telephone visit.  Blood Pressure Weight BMI  BP Readings from Last 3 Encounters:  05/22/19 (!) 144/77  03/23/19 129/69  02/21/19 131/80   Wt Readings from Last 3 Encounters:  05/22/19 173 lb (78.5 kg)  05/22/19 173 lb (78.5 kg)  03/23/19 172 lb (78 kg)   BMI Readings from Last 1 Encounters:  05/22/19 27.92 kg/m    *Unable to obtain current vital signs, weight, and BMI due to telephone visit type  Hearing/Vision  . Lainee did not seem to have difficulty with hearing/understanding during the telephone conversation . Reports that she has had a formal eye exam by an eye care professional within the past year . Reports that she has not had a formal hearing evaluation within the past year *Unable to fully assess hearing and vision during telephone visit type  Cognitive Function: 6CIT Screen 06/05/2019  What Year? 0 points  What month? 0 points  What time? 0 points  Count back from 20 2 points  Months in reverse 0 points  Repeat phrase 0 points  Total Score 2    Normal Cognitive Function Screening: Yes (Normal:0-7, Significant for Dysfunction: >8)  Immunization & Health Maintenance Record  There is no immunization history on file for this patient.  Health Maintenance  Topic Date Due  . PNA vac Low Risk Adult (1 of 2 - PCV13) 03/02/2015  . FOOT EXAM  07/19/2019  . OPHTHALMOLOGY EXAM  09/22/2019  . HEMOGLOBIN A1C  09/23/2019  . MAMMOGRAM  04/25/2020  . COLONOSCOPY  08/17/2022  . TETANUS/TDAP  12/27/2022  . DEXA SCAN  Completed  . Hepatitis C Screening  Completed  . INFLUENZA VACCINE  Discontinued  Assessment  This is a routine wellness examination for MALAYASIA MIRKIN.  Health Maintenance: Due or Overdue Health Maintenance Due  Topic Date Due  . PNA vac Low Risk Adult (1 of 2 - PCV13) 03/02/2015     Renee [name] does not need a referral for Community Assistance: Care Management:   no Social Work:    no Prescription Assistance:  no Nutrition/Diabetes Education:  no   Plan:  Personalized Goals Goals Addressed            This Visit's Progress   . HEMOGLOBIN A1C < 7.0        Personalized Health Maintenance & Screening Recommendations  Pneumococcal vaccine   Shingrix vaccine series  Lung Cancer Screening Recommended: no (Low Dose CT Chest recommended if Age 6-80 years, 30 pack-year currently smoking OR have quit w/in past 15 years) Hepatitis C Screening recommended: completed 12/26/2015   Advanced Directives: Written information was prepared per patient's request.  Referrals & Orders N/A  Follow-up Plan . Follow-up with Sharion Balloon, FNP as planned . Please work on your goal of maintaining a hemoglobin A1c less than 7% . At your convenience, please bring a copy of your Advance Directives (Healthcare Power of Attorney and Living Will) to our office to be filed in your medical record.      I have personally reviewed and noted the following in the patient's chart:   . Medical and social history . Use of alcohol, tobacco or illicit drugs  . Current medications and supplements . Functional ability and status . Nutritional status . Physical activity . Advanced directives . List of other physicians . Hospitalizations, surgeries, and ER visits in previous 12 months . Vitals . Screenings to include cognitive, depression, and falls . Referrals and appointments  In addition, I have reviewed and discussed with Renee [name] certain preventive protocols, quality metrics, and best practice recommendations. A written personalized care plan for preventive services as well as general preventive health recommendations is available and can be mailed to the patient at her request.      Nolberto Hanlon, RN  06/05/2019  I have reviewed and agree with the above AWV  documentation.   Evelina Dun, FNP

## 2019-07-12 ENCOUNTER — Telehealth: Payer: Self-pay | Admitting: Family

## 2019-07-12 NOTE — Telephone Encounter (Signed)
Aware. No januvia medication has arrived with her name on it.

## 2019-07-13 NOTE — Telephone Encounter (Signed)
Pt notified Januvia is ready for pick up Placed at front desk

## 2019-07-18 ENCOUNTER — Other Ambulatory Visit: Payer: Self-pay | Admitting: Family

## 2019-07-18 DIAGNOSIS — E1159 Type 2 diabetes mellitus with other circulatory complications: Secondary | ICD-10-CM

## 2019-07-29 ENCOUNTER — Other Ambulatory Visit: Payer: Self-pay | Admitting: Family

## 2019-07-29 DIAGNOSIS — K2100 Gastro-esophageal reflux disease with esophagitis, without bleeding: Secondary | ICD-10-CM

## 2019-08-01 ENCOUNTER — Other Ambulatory Visit: Payer: Self-pay | Admitting: Family

## 2019-08-01 DIAGNOSIS — E1142 Type 2 diabetes mellitus with diabetic polyneuropathy: Secondary | ICD-10-CM

## 2019-08-07 DIAGNOSIS — L84 Corns and callosities: Secondary | ICD-10-CM | POA: Diagnosis not present

## 2019-08-07 DIAGNOSIS — B351 Tinea unguium: Secondary | ICD-10-CM | POA: Diagnosis not present

## 2019-08-07 DIAGNOSIS — M79676 Pain in unspecified toe(s): Secondary | ICD-10-CM | POA: Diagnosis not present

## 2019-08-07 DIAGNOSIS — E1151 Type 2 diabetes mellitus with diabetic peripheral angiopathy without gangrene: Secondary | ICD-10-CM | POA: Diagnosis not present

## 2019-08-22 ENCOUNTER — Other Ambulatory Visit: Payer: Self-pay | Admitting: Family

## 2019-08-22 DIAGNOSIS — E1165 Type 2 diabetes mellitus with hyperglycemia: Secondary | ICD-10-CM

## 2019-08-22 DIAGNOSIS — IMO0002 Reserved for concepts with insufficient information to code with codable children: Secondary | ICD-10-CM

## 2019-08-29 ENCOUNTER — Other Ambulatory Visit: Payer: Self-pay | Admitting: Family

## 2019-08-29 DIAGNOSIS — IMO0002 Reserved for concepts with insufficient information to code with codable children: Secondary | ICD-10-CM

## 2019-08-29 DIAGNOSIS — E1165 Type 2 diabetes mellitus with hyperglycemia: Secondary | ICD-10-CM

## 2019-08-30 ENCOUNTER — Telehealth: Payer: Self-pay | Admitting: "Endocrinology

## 2019-08-30 ENCOUNTER — Other Ambulatory Visit: Payer: Self-pay

## 2019-08-30 DIAGNOSIS — E1165 Type 2 diabetes mellitus with hyperglycemia: Secondary | ICD-10-CM

## 2019-08-30 DIAGNOSIS — IMO0002 Reserved for concepts with insufficient information to code with codable children: Secondary | ICD-10-CM

## 2019-08-30 MED ORDER — TRESIBA FLEXTOUCH 100 UNIT/ML ~~LOC~~ SOPN: 20.0000 [IU] | PEN_INJECTOR | Freq: Every day | SUBCUTANEOUS | 1 refills | Status: DC

## 2019-08-30 NOTE — Telephone Encounter (Signed)
Rx Sent  

## 2019-08-30 NOTE — Telephone Encounter (Signed)
Patient needs refill on Tresiba Flextouch pen. [facility] in [place]

## 2019-08-31 ENCOUNTER — Telehealth: Payer: Self-pay

## 2019-08-31 DIAGNOSIS — E1165 Type 2 diabetes mellitus with hyperglycemia: Secondary | ICD-10-CM

## 2019-08-31 DIAGNOSIS — IMO0002 Reserved for concepts with insufficient information to code with codable children: Secondary | ICD-10-CM

## 2019-08-31 MED ORDER — TRESIBA FLEXTOUCH 100 UNIT/ML ~~LOC~~ SOPN: 20.0000 [IU] | PEN_INJECTOR | Freq: Every day | SUBCUTANEOUS | 1 refills | Status: DC

## 2019-08-31 NOTE — Telephone Encounter (Signed)
LeighAnn Laisa Larrick, CMA  

## 2019-09-04 NOTE — Telephone Encounter (Signed)
LeighAnn Alean Kromer, CMA  

## 2019-09-18 ENCOUNTER — Other Ambulatory Visit: Payer: Self-pay

## 2019-09-18 ENCOUNTER — Telehealth: Payer: Self-pay

## 2019-09-18 ENCOUNTER — Other Ambulatory Visit: Payer: BC Managed Care – PPO

## 2019-09-18 DIAGNOSIS — I10 Essential (primary) hypertension: Secondary | ICD-10-CM

## 2019-09-18 DIAGNOSIS — E782 Mixed hyperlipidemia: Secondary | ICD-10-CM

## 2019-09-18 DIAGNOSIS — IMO0002 Reserved for concepts with insufficient information to code with codable children: Secondary | ICD-10-CM

## 2019-09-18 DIAGNOSIS — E1165 Type 2 diabetes mellitus with hyperglycemia: Secondary | ICD-10-CM

## 2019-09-18 NOTE — Telephone Encounter (Signed)
LeighAnn Sequoia Witz, CMA  

## 2019-09-19 LAB — COMPREHENSIVE METABOLIC PANEL
ALT: 20 IU/L (ref 0–32)
AST: 20 IU/L (ref 0–40)
Albumin/Globulin Ratio: 1.5 (ref 1.2–2.2)
Albumin: 4.5 g/dL (ref 3.8–4.8)
Alkaline Phosphatase: 128 IU/L — ABNORMAL HIGH (ref 39–117)
BUN/Creatinine Ratio: 19 (ref 12–28)
BUN: 23 mg/dL (ref 8–27)
Bilirubin Total: <0.2 mg/dL (ref 0.0–1.2)
CO2: 23 mmol/L (ref 20–29)
Calcium: 10.3 mg/dL (ref 8.7–10.3)
Chloride: 104 mmol/L (ref 96–106)
Creatinine, Ser: 1.24 mg/dL — ABNORMAL HIGH (ref 0.57–1.00)
GFR calc Af Amer: 51 mL/min/{1.73_m2} — ABNORMAL LOW (ref >59)
GFR calc non Af Amer: 44 mL/min/{1.73_m2} — ABNORMAL LOW (ref >59)
Globulin, Total: 3 g/dL (ref 1.5–4.5)
Glucose: 161 mg/dL — ABNORMAL HIGH (ref 65–99)
Potassium: 4.5 mmol/L (ref 3.5–5.2)
Sodium: 142 mmol/L (ref 134–144)
Total Protein: 7.5 g/dL (ref 6.0–8.5)

## 2019-09-19 LAB — HEMOGLOBIN A1C
Est. average glucose Bld gHb Est-mCnc: 223 mg/dL
Hgb A1c MFr Bld: 9.4 % — ABNORMAL HIGH (ref 4.8–5.6)

## 2019-09-24 ENCOUNTER — Other Ambulatory Visit: Payer: Self-pay

## 2019-09-24 ENCOUNTER — Encounter: Payer: Self-pay | Admitting: "Endocrinology

## 2019-09-24 ENCOUNTER — Ambulatory Visit (INDEPENDENT_AMBULATORY_CARE_PROVIDER_SITE_OTHER): Payer: Medicare Other | Admitting: "Endocrinology

## 2019-09-24 ENCOUNTER — Ambulatory Visit: Payer: BC Managed Care – PPO | Admitting: Nutrition

## 2019-09-24 DIAGNOSIS — I10 Essential (primary) hypertension: Secondary | ICD-10-CM | POA: Diagnosis not present

## 2019-09-24 DIAGNOSIS — E782 Mixed hyperlipidemia: Secondary | ICD-10-CM | POA: Diagnosis not present

## 2019-09-24 DIAGNOSIS — IMO0002 Reserved for concepts with insufficient information to code with codable children: Secondary | ICD-10-CM

## 2019-09-24 DIAGNOSIS — E1165 Type 2 diabetes mellitus with hyperglycemia: Secondary | ICD-10-CM | POA: Diagnosis not present

## 2019-09-24 MED ORDER — GLIPIZIDE ER 5 MG PO TB24: 5.0000 mg | ORAL_TABLET | Freq: Every day | ORAL | 3 refills | Status: DC

## 2019-09-24 MED ORDER — TRESIBA FLEXTOUCH 100 UNIT/ML ~~LOC~~ SOPN: 30.0000 [IU] | PEN_INJECTOR | Freq: Every day | SUBCUTANEOUS | 1 refills | Status: DC

## 2019-09-24 NOTE — Progress Notes (Signed)
09/24/2019, 10:08 AM                                                    Endocrinology Telehealth Visit Follow up Note -During COVID -19 Pandemic  This visit type was conducted due to national recommendations for restrictions regarding the COVID-19 Pandemic  in an effort to limit this patient's exposure and mitigate transmission of the corona virus.  Due to her co-morbid illnesses, Renee [name] is at  moderate to high risk for complications without adequate follow up.  This format is felt to be most appropriate for her at this time.  I connected with this patient on 09/24/2019   by telephone and verified that I am speaking with the correct person using two identifiers. Renee [name], 01/02/1951. she has verbally consented to this visit. All issues noted in this document were discussed and addressed. The format was not optimal for physical exam.    Subjective:    Patient ID: Renee [name], female    DOB: July 06, 1951.  Renee [name] is being engaged in telehealth via telephone  in follow-up for the management of her currently uncontrolled type 2 diabetes, hyperlipidemia, hypertension. PCP:   Sharion Balloon, FNP.   Past Medical History:  Diagnosis Date  . Arthritis   . Breast cancer ([place]) 2001   right mastectomy  . CVA (cerebral infarction) 2003, 2005  . DM (diabetes mellitus) ([place])   . GERD (gastroesophageal reflux disease)   . Heart murmur   . Hyperlipidemia   . Hypertension   . Seizures ([place])   . Vitamin D deficiency    Past Surgical History:  Procedure Laterality Date  . ABDOMINAL HYSTERECTOMY  2000  . GANGLION CYST EXCISION  2010   right (2010)  and left (before 2010) hand  . MASTECTOMY  2001   right  . TONSILECTOMY/ADENOIDECTOMY WITH MYRINGOTOMY Bilateral    age 16's   Social History   Socioeconomic History  . Marital status: Single    Spouse name: Not on file  . Number of children: Not on  file  . Years of education: 62  . Highest education level: Associate degree: academic program  Occupational History  . Occupation: Retired    Fish farm manager: [employer]: [place]   Social Needs  . Financial resource strain: Not hard at all  . Food insecurity    Worry: Never true    Inability: Never true  . Transportation needs    Medical: No    Non-medical: No  Tobacco Use  . Smoking status: Never Smoker  . Smokeless tobacco: Never Used  Substance and Sexual Activity  . Alcohol use: Not Currently    Alcohol/week: 1.0 standard drinks    Types: 1 Glasses of wine per week  . Drug use: No  . Sexual activity: Yes    Birth control/protection: Post-menopausal  Lifestyle  . Physical activity    Days per week: 3 days    Minutes per session: 60  min  . Stress: Only a little  Relationships  . Social connections    Talks on phone: More than three times a week    Gets together: More than three times a week    Attends religious service: More than 4 times per year    Active member of club or organization: Yes    Attends meetings of clubs or organizations: More than 4 times per year    Relationship status: Never married  Other Topics Concern  . Not on file  Social History Narrative  . Not on file   Outpatient Encounter Medications as of 09/24/2019  Medication Sig  . aspirin 81 MG tablet Take 81 mg by mouth daily.  . bisoprolol-hydrochlorothiazide (ZIAC) 2.5-6.25 MG tablet Take 1 tablet by mouth once daily  . Blood Glucose Calibration (ONETOUCH VERIO) SOLN Use as needed to calibrate glucometer  . Blood Glucose Monitoring Suppl ([place]) w/Device KIT Use to check blood sugar twice daily  . docusate sodium (COLACE) 100 MG capsule Take 100 mg by mouth daily.  Marland Kitchen gabapentin (NEURONTIN) 400 MG capsule TAKE 1 CAPSULE BY MOUTH THREE TIMES DAILY  . glipiZIDE (GLUCOTROL XL) 5 MG 24 hr tablet Take 1 tablet (5 mg total) by mouth  daily with breakfast.  . glucose blood (ONETOUCH VERIO) test strip Use to check blood sugar twice daily  . insulin degludec (TRESIBA FLEXTOUCH) 100 UNIT/ML SOPN FlexTouch Pen Inject 0.3 mLs (30 Units total) into the skin at bedtime.  . Multiple Vitamin (MULTIVITAMIN WITH MINERALS) TABS tablet Take 1 tablet by mouth daily.  Marland Kitchen omeprazole (PRILOSEC) 20 MG capsule Take 1 capsule by mouth once daily  . ONETOUCH DELICA LANCETS 14H MISC Use to check BG up to bid.  Dx: E11.65 type 2 DM with insulin therapy, uncontrolled  . simvastatin (ZOCOR) 40 MG tablet TAKE 1 TABLET BY MOUTH AT BEDTIME  . sitaGLIPtin (JANUVIA) 100 MG tablet Take 100 mg by mouth daily.  . Vitamin D, Ergocalciferol, (DRISDOL) 1.25 MG (50000 UT) CAPS capsule Take 1 capsule (50,000 Units total) by mouth every 7 (seven) days.  . [DISCONTINUED] insulin degludec (TRESIBA FLEXTOUCH) 100 UNIT/ML SOPN FlexTouch Pen Inject 0.2 mLs (20 Units total) into the skin at bedtime.   No facility-administered encounter medications on file as of 09/24/2019.     ALLERGIES: Allergies  Allergen Reactions  . Metformin And Related Other (See Comments)    seizures  . Penicillins     VACCINATION STATUS:  There is no immunization history on file for this patient.  Diabetes She presents for her follow-up diabetic visit. She has type 2 diabetes mellitus. Onset time: Patient says she was diagnosed last year, however her A1c has been high enough for diabetes at least since 2015.  Have A1c is as high as greater than 14%. Her disease course has been worsening. There are no hypoglycemic associated symptoms. Pertinent negatives for hypoglycemia include no confusion, headaches, pallor or seizures. Associated symptoms include polydipsia and polyuria. Pertinent negatives for diabetes include no chest pain, no fatigue and no polyphagia. There are no hypoglycemic complications. Symptoms are worsening. Diabetic complications include peripheral neuropathy. Risk factors for  coronary artery disease include dyslipidemia, diabetes mellitus, family history, hypertension, sedentary lifestyle and post-menopausal. Current diabetic treatments: She is currently on Tresiba 23 units nightly, glipizide 10 mg p.o. twice daily, Januvia 100 mg p.o. once a day.  She does not tolerate metformin. She is compliant with treatment most of the time. Her weight is stable. She is  following a generally unhealthy diet. When asked about meal planning, she reported none. She has not had a previous visit with a dietitian. She never participates in exercise. Her home blood glucose trend is increasing steadily. Her breakfast blood glucose range is generally 140-180 mg/dl. Her bedtime blood glucose range is generally 180-200 mg/dl. Her overall blood glucose range is 180-200 mg/dl. An ACE inhibitor/angiotensin II receptor blocker is not being taken.  Hyperlipidemia This is a chronic problem. The current episode started more than 1 year ago. The problem is controlled. Exacerbating diseases include diabetes. Pertinent negatives include no chest pain, myalgias or shortness of breath. Current antihyperlipidemic treatment includes statins. Risk factors for coronary artery disease include diabetes mellitus, dyslipidemia, hypertension, a sedentary lifestyle and post-menopausal.  Hypertension This is a chronic problem. The current episode started more than 1 year ago. The problem is controlled. Pertinent negatives include no chest pain, headaches, palpitations or shortness of breath. Risk factors for coronary artery disease include dyslipidemia, diabetes mellitus, sedentary lifestyle, family history and post-menopausal state. Past treatments include beta blockers.     Objective:    There were no vitals taken for this visit.  Wt Readings from Last 3 Encounters:  05/22/19 173 lb (78.5 kg)  05/22/19 173 lb (78.5 kg)  03/23/19 172 lb (78 kg)        CMP ( most recent) CMP     Component Value Date/Time   NA  142 09/18/2019 0850   K 4.5 09/18/2019 0850   CL 104 09/18/2019 0850   CO2 23 09/18/2019 0850   GLUCOSE 161 (H) 09/18/2019 0850   GLUCOSE 185 (H) 01/15/2016 0046   BUN 23 09/18/2019 0850   CREATININE 1.24 (H) 09/18/2019 0850   CALCIUM 10.3 09/18/2019 0850   PROT 7.5 09/18/2019 0850   ALBUMIN 4.5 09/18/2019 0850   AST 20 09/18/2019 0850   ALT 20 09/18/2019 0850   ALKPHOS 128 (H) 09/18/2019 0850   BILITOT <0.2 09/18/2019 0850   GFRNONAA 44 (L) 09/18/2019 0850   GFRAA 51 (L) 09/18/2019 0850     Diabetic Labs (most recent): Lab Results  Component Value Date   HGBA1C 9.4 (H) 09/18/2019   HGBA1C 7.2 (H) 03/23/2019   HGBA1C 8.8 (H) 12/21/2018     Lipid Panel ( most recent) Lipid Panel     Component Value Date/Time   CHOL 120 03/23/2019 1113   TRIG 52 03/23/2019 1113   HDL 41 03/23/2019 1113   CHOLHDL 2.9 03/23/2019 1113   LDLCALC 69 03/23/2019 1113      Lab Results  Component Value Date   TSH 2.710 07/05/2016       Assessment & Plan:   1. Uncontrolled type 2 diabetes mellitus, without long-term current use of insulin ([place])  - Renee [name] has currently uncontrolled symptomatic type 2 DM since approximately age of 68 years of age. -She reports significantly above target glycemic profile and A1c of 9.4% increasing from 7.2%.  He denies hypoglycemia.    -Recent labs are reviewed with her.     -her diabetes is complicated by peripheral neuropathy and she remains at a high risk for more acute and chronic complications which include CAD, CVA, CKD, retinopathy, and neuropathy. These are all discussed in detail with her.  - I have counseled her on diet management by adopting a carbohydrate restricted/protein rich diet.  - she  admits there is a room for improvement in her diet and drink choices. -  Suggestion is made for her to avoid  simple carbohydrates  from her diet including Cakes, Sweet Desserts / Pastries, Ice Cream, Soda (diet and regular), Sweet Tea, Candies,  Chips, Cookies, Sweet Pastries,  Store Bought Juices, Alcohol in Excess of  1-2 drinks a day, Artificial Sweeteners, Coffee Creamer, and "Sugar-free" Products. This will help patient to have stable blood glucose profile and potentially avoid unintended weight gain.  - I encouraged her to switch to  unprocessed or minimally processed complex starch and increased protein intake (animal or plant source), fruits, and vegetables.  - she is advised to stick to a routine mealtimes to eat 3 meals  a day and avoid unnecessary snacks ( to snack only to correct hypoglycemia).   - she has been scheduled  with Jearld Fenton, RDN, CDE for individualized diabetes education.  - I have approached her with the following individualized plan to manage diabetes and patient agrees:   -She will require higher dose of insulin in order for her to achieve and maintain control of diabetes to target.  -She is advised to increase her Tresiba to 30 units nightly,  continue to monitor blood glucose 2 times a day-daily before breakfast and at bedtime.   - she is encouraged to call clinic for blood glucose levels less than 70 or above 300 mg /dl. -She reports significant intolerance to metformin and does not wish to resume. -She is advised to continue Januvia 100 mg p.o. daily with breakfast  -She will benefit from a low dose glipizide treatment.  I discussed and added glipizide 5 mg XL p.o. daily at breakfast. -She is not a candidate for SGLT2 inhibitors nor GLP-1 receptor agonist.   - Patient specific target  A1c;  LDL, HDL, Triglycerides, and  Waist Circumference were discussed in detail.  2) Blood Pressure /Hypertension:  her blood pressure is not controlled to target.  She is advised to continue  her current medications including bisoprolol-hydrochlorothiazide 12.5-6.25 p.o. twice daily.   3) Lipids/Hyperlipidemia:   Review of her recent lipid panel showed  controlled  LDL at 67 .  she  is advised to continue  simvastatin 40 mg p.o. daily at bedtime.    Side effects and precautions discussed with her.  4)  Weight/Diet: -She is not a candidate for weight loss.  I discussed with her the fact that loss of 5 - 10% of her  current body weight will have the most impact on her diabetes management.  CDE Consult will be initiated . Exercise, and detailed carbohydrates information provided  -  detailed on discharge instructions.  5) Chronic Care/Health Maintenance:  -she  is on statin medications and  is encouraged to initiate and continue to follow up with Ophthalmology, Dentist,  Podiatrist at least yearly or according to recommendations, and advised to  stay away from smoking. I have recommended yearly flu vaccine and pneumonia vaccine at least every 5 years; moderate intensity exercise for up to 150 minutes weekly; and  sleep for at least 7 hours a day.  - she is  advised to maintain close follow up with Sharion Balloon, FNP for primary care needs, as well as her other providers for optimal and coordinated care.  - Patient Care Time Today:  25 min, of which >50% was spent in  counseling and the rest reviewing her  current and  previous labs/studies, previous treatments, her blood glucose readings, and medications' doses and developing a plan for long-term care based on the latest recommendations for standards of care.   Renee [name]  participated in the discussions, expressed understanding, and voiced agreement with the above plans.  All questions were answered to her satisfaction. she is encouraged to contact clinic should she have any questions or concerns prior to her return visit.  Follow up plan: - Return in about 4 months (around 01/24/2020) for Bring Meter and Logs- A1c in Office, Include 8 log sheets.  Glade Lloyd, MD [facility] [address] [place], [place] [address] Phone: [phone]  Fax: [phone]    09/24/2019, 10:08 AM  This note  was partially dictated with voice recognition software. Similar sounding words can be transcribed inadequately or may not  be corrected upon review.

## 2019-10-16 ENCOUNTER — Other Ambulatory Visit: Payer: Self-pay | Admitting: Family

## 2019-10-16 DIAGNOSIS — I152 Hypertension secondary to endocrine disorders: Secondary | ICD-10-CM

## 2019-10-16 DIAGNOSIS — E1159 Type 2 diabetes mellitus with other circulatory complications: Secondary | ICD-10-CM

## 2019-10-16 DIAGNOSIS — I10 Essential (primary) hypertension: Secondary | ICD-10-CM

## 2019-10-31 ENCOUNTER — Other Ambulatory Visit: Payer: Self-pay | Admitting: Family

## 2019-10-31 DIAGNOSIS — K2100 Gastro-esophageal reflux disease with esophagitis, without bleeding: Secondary | ICD-10-CM

## 2019-11-05 ENCOUNTER — Other Ambulatory Visit: Payer: Self-pay

## 2019-11-05 ENCOUNTER — Encounter: Payer: Self-pay | Admitting: Family

## 2019-11-05 ENCOUNTER — Ambulatory Visit (INDEPENDENT_AMBULATORY_CARE_PROVIDER_SITE_OTHER): Payer: Medicare Other | Admitting: Family

## 2019-11-05 ENCOUNTER — Other Ambulatory Visit: Payer: Self-pay | Admitting: Family

## 2019-11-05 VITALS — BP 129/75 | HR 63 | Temp 97.5°F | Ht 66.0 in | Wt 177.4 lb

## 2019-11-05 DIAGNOSIS — K5900 Constipation, unspecified: Secondary | ICD-10-CM

## 2019-11-05 DIAGNOSIS — I152 Hypertension secondary to endocrine disorders: Secondary | ICD-10-CM

## 2019-11-05 DIAGNOSIS — K2100 Gastro-esophageal reflux disease with esophagitis, without bleeding: Secondary | ICD-10-CM

## 2019-11-05 DIAGNOSIS — E785 Hyperlipidemia, unspecified: Secondary | ICD-10-CM | POA: Diagnosis not present

## 2019-11-05 DIAGNOSIS — Z853 Personal history of malignant neoplasm of breast: Secondary | ICD-10-CM | POA: Diagnosis not present

## 2019-11-05 DIAGNOSIS — E1169 Type 2 diabetes mellitus with other specified complication: Secondary | ICD-10-CM | POA: Diagnosis not present

## 2019-11-05 DIAGNOSIS — E1142 Type 2 diabetes mellitus with diabetic polyneuropathy: Secondary | ICD-10-CM

## 2019-11-05 DIAGNOSIS — E1159 Type 2 diabetes mellitus with other circulatory complications: Secondary | ICD-10-CM | POA: Diagnosis not present

## 2019-11-05 DIAGNOSIS — E1165 Type 2 diabetes mellitus with hyperglycemia: Secondary | ICD-10-CM

## 2019-11-05 DIAGNOSIS — E663 Overweight: Secondary | ICD-10-CM | POA: Diagnosis not present

## 2019-11-05 DIAGNOSIS — Z8673 Personal history of transient ischemic attack (TIA), and cerebral infarction without residual deficits: Secondary | ICD-10-CM | POA: Diagnosis not present

## 2019-11-05 DIAGNOSIS — I10 Essential (primary) hypertension: Secondary | ICD-10-CM

## 2019-11-05 DIAGNOSIS — IMO0002 Reserved for concepts with insufficient information to code with codable children: Secondary | ICD-10-CM

## 2019-11-05 LAB — BAYER DCA HB A1C WAIVED: HB A1C (BAYER DCA - WAIVED): 8.2 % — ABNORMAL HIGH (ref <7.0)

## 2019-11-05 MED ORDER — OMEPRAZOLE 20 MG PO CPDR: 20.0000 mg | DELAYED_RELEASE_CAPSULE | Freq: Every day | ORAL | 3 refills | Status: DC

## 2019-11-05 MED ORDER — SIMVASTATIN 40 MG PO TABS: 40.0000 mg | ORAL_TABLET | Freq: Every day | ORAL | 3 refills | Status: DC

## 2019-11-05 MED ORDER — GLIPIZIDE ER 5 MG PO TB24: 5.0000 mg | ORAL_TABLET | Freq: Every day | ORAL | 3 refills | Status: DC

## 2019-11-05 MED ORDER — BISOPROLOL-HYDROCHLOROTHIAZIDE 2.5-6.25 MG PO TABS: 1.0000 | ORAL_TABLET | Freq: Every day | ORAL | 3 refills | Status: DC

## 2019-11-05 MED ORDER — TRESIBA FLEXTOUCH 100 UNIT/ML ~~LOC~~ SOPN: 30.0000 [IU] | PEN_INJECTOR | Freq: Every day | SUBCUTANEOUS | 3 refills | Status: DC

## 2019-11-05 MED ORDER — GABAPENTIN 400 MG PO CAPS: 400.0000 mg | ORAL_CAPSULE | Freq: Three times a day (TID) | ORAL | 3 refills | Status: DC

## 2019-11-05 NOTE — Progress Notes (Signed)
Subjective:    Patient ID: Renee [name], female    DOB: 13-Jun-1951, 68 y.o.   MRN: [id number]  Chief Complaint  Patient presents with  . Medical Management of Chronic Issues    PT presents to the office today for chronic follow up. Pt has hx CVA. She is followed by Diabetic Educator that is helping with her low carb diet.  Hypertension This is a chronic problem. The current episode started more than 1 year ago. The problem has been resolved since onset. The problem is controlled. Pertinent negatives include no blurred vision, headaches, malaise/fatigue, peripheral edema or shortness of breath. Risk factors for coronary artery disease include dyslipidemia, diabetes mellitus, obesity and sedentary lifestyle. The current treatment provides moderate improvement. Hypertensive end-organ damage includes CVA.  Diabetes She presents for her follow-up diabetic visit. She has type 2 diabetes mellitus. There are no hypoglycemic associated symptoms. Pertinent negatives for hypoglycemia include no headaches. Pertinent negatives for diabetes include no blurred vision, no foot paresthesias and no visual change. Symptoms are stable. Diabetic complications include a CVA. Risk factors for coronary artery disease include dyslipidemia, diabetes mellitus, hypertension, sedentary lifestyle and post-menopausal. Her overall blood glucose range is 180-200 mg/dl. Eye exam is current.  Hyperlipidemia This is a chronic problem. The current episode started more than 1 year ago. The problem is controlled. Recent lipid tests were reviewed and are normal. Pertinent negatives include no shortness of breath. Current antihyperlipidemic treatment includes statins. The current treatment provides moderate improvement of lipids. Risk factors for coronary artery disease include dyslipidemia, diabetes mellitus, hypertension, a sedentary lifestyle and post-menopausal.  Gastroesophageal Reflux She complains of belching and heartburn.  This is a chronic problem. The current episode started more than 1 year ago. The problem occurs occasionally. She has tried a PPI for the symptoms. The treatment provided moderate relief.  Constipation This is a chronic problem. The current episode started more than 1 year ago. The problem has been waxing and waning since onset. Her stool frequency is 1 time per day. She has tried laxatives for the symptoms. The treatment provided mild relief.      Review of Systems  Constitutional: Negative for malaise/fatigue.  Eyes: Negative for blurred vision.  Respiratory: Negative for shortness of breath.   Gastrointestinal: Positive for constipation and heartburn.  Neurological: Negative for headaches.  All other systems reviewed and are negative.      Objective:   Physical Exam Vitals signs reviewed.  Constitutional:      General: She is not in acute distress.    Appearance: She is well-developed.  HENT:     Head: Normocephalic and atraumatic.     Right Ear: Tympanic membrane normal.     Left Ear: Tympanic membrane normal.  Eyes:     Pupils: Pupils are equal, round, and reactive to light.  Neck:     Musculoskeletal: Normal range of motion and neck supple.     Thyroid: No thyromegaly.  Cardiovascular:     Rate and Rhythm: Normal rate and regular rhythm.     Heart sounds: Normal heart sounds. No murmur.  Pulmonary:     Effort: Pulmonary effort is normal. No respiratory distress.     Breath sounds: Normal breath sounds. No wheezing.  Abdominal:     General: Bowel sounds are normal. There is no distension.     Palpations: Abdomen is soft.     Tenderness: There is no abdominal tenderness.  Musculoskeletal: Normal range of motion.  General: No tenderness.  Skin:    General: Skin is warm and dry.  Neurological:     Mental Status: She is alert and oriented to person, place, and time.     Cranial Nerves: No cranial nerve deficit.     Deep Tendon Reflexes: Reflexes are normal and  symmetric.  Psychiatric:        Behavior: Behavior normal.        Thought Content: Thought content normal.        Judgment: Judgment normal.    Diabetic Foot Exam - Simple   No data filed      BP 129/75   Pulse 63   Temp (!) 97.5 F (36.4 C) (Temporal)   Ht _0  (1.676 m)   Wt 177 lb 6.4 oz (80.5 kg)   SpO2 99%   BMI 28.63 kg/m      Assessment & Plan:  Renee [name] comes in today with chief complaint of Medical Management of Chronic Issues   Diagnosis and orders addressed:  1. Hypertension associated with diabetes ([place]) - bisoprolol-hydrochlorothiazide (ZIAC) 2.5-6.25 MG tablet; Take 1 tablet by mouth daily. (Needs to be seen before next refill)  Dispense: 90 tablet; Refill: 3 - CMP14+EGFR - CBC with Differential/Platelet  2. Diabetic peripheral neuropathy ([facility]) - gabapentin (NEURONTIN) 400 MG capsule; Take 1 capsule (400 mg total) by mouth 3 (three) times daily.  Dispense: 270 capsule; Refill: 3 - CMP14+EGFR - CBC with Differential/Platelet  3. Uncontrolled type 2 diabetes mellitus, without long-term current use of insulin ([facility]) - glipiZIDE (GLUCOTROL XL) 5 MG 24 hr tablet; Take 1 tablet (5 mg total) by mouth daily with breakfast.  Dispense: 90 tablet; Refill: 3 - insulin degludec (TRESIBA FLEXTOUCH) 100 UNIT/ML SOPN FlexTouch Pen; Inject 0.3 mLs (30 Units total) into the skin at bedtime.  Dispense: 15 mL; Refill: 3 - CMP14+EGFR - CBC with Differential/Platelet - hgba1c - Microalbumin / creatinine urine ratio  4. Hyperlipidemia associated with type 2 diabetes mellitus ([facility]) - simvastatin (ZOCOR) 40 MG tablet; Take 1 tablet (40 mg total) by mouth at bedtime.  Dispense: 90 tablet; Refill: 3 - CMP14+EGFR - CBC with Differential/Platelet - Lipid panel  5. Gastroesophageal reflux disease with esophagitis - omeprazole (PRILOSEC) 20 MG capsule; Take 1 capsule (20 mg total) by mouth daily.  Dispense: 90 capsule; Refill: 3 - CMP14+EGFR - CBC with  Differential/Platelet  6. Gastroesophageal reflux disease with esophagitis, unspecified whether hemorrhage - CMP14+EGFR - CBC with Differential/Platelet  7. History of CVA (cerebrovascular accident) - CMP14+EGFR - CBC with Differential/Platelet  8. History of breast cancer in female - CMP14+EGFR - CBC with Differential/Platelet  9. Overweight (BMI 25.0-29.9) - CMP14+EGFR - CBC with Differential/Platelet  10. Constipation, unspecified constipation type - CMP14+EGFR - CBC with Differential/Platelet   Labs pending Health Maintenance reviewed Diet and exercise encouraged  Follow up plan: 3 months    Evelina Dun, FNP

## 2019-11-05 NOTE — Addendum Note (Signed)
Addended by: Earlene Plater on: 11/05/2019 12:43 PM   Modules accepted: Orders

## 2019-11-05 NOTE — Patient Instructions (Signed)

## 2019-11-05 NOTE — Addendum Note (Signed)
Addended by: Shelbie Ammons on: 11/05/2019 12:43 PM   Modules accepted: Orders

## 2019-11-06 ENCOUNTER — Other Ambulatory Visit: Payer: Self-pay | Admitting: Family

## 2019-11-06 DIAGNOSIS — E1151 Type 2 diabetes mellitus with diabetic peripheral angiopathy without gangrene: Secondary | ICD-10-CM | POA: Diagnosis not present

## 2019-11-06 DIAGNOSIS — M79676 Pain in unspecified toe(s): Secondary | ICD-10-CM | POA: Diagnosis not present

## 2019-11-06 DIAGNOSIS — B351 Tinea unguium: Secondary | ICD-10-CM | POA: Diagnosis not present

## 2019-11-06 DIAGNOSIS — L84 Corns and callosities: Secondary | ICD-10-CM | POA: Diagnosis not present

## 2019-11-06 LAB — CBC WITH DIFFERENTIAL/PLATELET
Basophils Absolute: 0.1 10*3/uL (ref 0.0–0.2)
Basos: 1 %
EOS (ABSOLUTE): 0.1 10*3/uL (ref 0.0–0.4)
Eos: 2 %
Hematocrit: 40.8 % (ref 34.0–46.6)
Hemoglobin: 13.6 g/dL (ref 11.1–15.9)
Immature Grans (Abs): 0 10*3/uL (ref 0.0–0.1)
Immature Granulocytes: 0 %
Lymphocytes Absolute: 1.9 10*3/uL (ref 0.7–3.1)
Lymphs: 30 %
MCH: 27.9 pg (ref 26.6–33.0)
MCHC: 33.3 g/dL (ref 31.5–35.7)
MCV: 84 fL (ref 79–97)
Monocytes Absolute: 0.6 10*3/uL (ref 0.1–0.9)
Monocytes: 9 %
Neutrophils Absolute: 3.8 10*3/uL (ref 1.4–7.0)
Neutrophils: 58 %
Platelets: 322 10*3/uL (ref 150–450)
RBC: 4.87 x10E6/uL (ref 3.77–5.28)
RDW: 12.7 % (ref 11.7–15.4)
WBC: 6.5 10*3/uL (ref 3.4–10.8)

## 2019-11-06 LAB — CMP14+EGFR
ALT: 23 IU/L (ref 0–32)
AST: 20 IU/L (ref 0–40)
Albumin/Globulin Ratio: 1.5 (ref 1.2–2.2)
Albumin: 4.3 g/dL (ref 3.8–4.8)
Alkaline Phosphatase: 105 IU/L (ref 39–117)
BUN/Creatinine Ratio: 15 (ref 12–28)
BUN: 14 mg/dL (ref 8–27)
Bilirubin Total: 0.3 mg/dL (ref 0.0–1.2)
CO2: 22 mmol/L (ref 20–29)
Calcium: 10.1 mg/dL (ref 8.7–10.3)
Chloride: 104 mmol/L (ref 96–106)
Creatinine, Ser: 0.92 mg/dL (ref 0.57–1.00)
GFR calc Af Amer: 73 mL/min/{1.73_m2} (ref >59)
GFR calc non Af Amer: 64 mL/min/{1.73_m2} (ref >59)
Globulin, Total: 2.9 g/dL (ref 1.5–4.5)
Glucose: 164 mg/dL — ABNORMAL HIGH (ref 65–99)
Potassium: 4.5 mmol/L (ref 3.5–5.2)
Sodium: 139 mmol/L (ref 134–144)
Total Protein: 7.2 g/dL (ref 6.0–8.5)

## 2019-11-06 LAB — LIPID PANEL
Chol/HDL Ratio: 3.1 ratio (ref 0.0–4.4)
Cholesterol, Total: 146 mg/dL (ref 100–199)
HDL: 47 mg/dL (ref >39)
LDL Chol Calc (NIH): 82 mg/dL (ref 0–99)
Triglycerides: 87 mg/dL (ref 0–149)
VLDL Cholesterol Cal: 17 mg/dL (ref 5–40)

## 2019-11-06 LAB — MICROALBUMIN / CREATININE URINE RATIO
Creatinine, Urine: 61.4 mg/dL
Microalb/Creat Ratio: 6 mg/g creat (ref 0–29)
Microalbumin, Urine: 3.9 ug/mL

## 2019-11-06 MED ORDER — ATORVASTATIN CALCIUM 40 MG PO TABS: 40.0000 mg | ORAL_TABLET | Freq: Every day | ORAL | 1 refills | Status: DC

## 2019-11-07 ENCOUNTER — Telehealth: Payer: Self-pay | Admitting: "Endocrinology

## 2019-11-07 NOTE — Telephone Encounter (Signed)
Pt left a VM that her PMD increased her Tresbia to 35 units instead of 30, she wants to know if that is ok with you.

## 2019-11-07 NOTE — Telephone Encounter (Signed)
Ok

## 2019-11-12 ENCOUNTER — Other Ambulatory Visit: Payer: Self-pay

## 2019-11-12 ENCOUNTER — Ambulatory Visit (INDEPENDENT_AMBULATORY_CARE_PROVIDER_SITE_OTHER): Payer: Medicare Other | Admitting: Family Medicine

## 2019-11-12 ENCOUNTER — Encounter: Payer: Self-pay | Admitting: Family Medicine

## 2019-11-12 DIAGNOSIS — J069 Acute upper respiratory infection, unspecified: Secondary | ICD-10-CM

## 2019-11-12 MED ORDER — DOXYCYCLINE HYCLATE 100 MG PO TABS: 100.0000 mg | ORAL_TABLET | Freq: Two times a day (BID) | ORAL | 0 refills | Status: AC

## 2019-11-12 MED ORDER — ALBUTEROL SULFATE HFA 108 (90 BASE) MCG/ACT IN AERS: 2.0000 | INHALATION_SPRAY | Freq: Four times a day (QID) | RESPIRATORY_TRACT | 0 refills | Status: DC | PRN

## 2019-11-12 MED ORDER — BENZONATATE 100 MG PO CAPS: 100.0000 mg | ORAL_CAPSULE | Freq: Three times a day (TID) | ORAL | 0 refills | Status: DC | PRN

## 2019-11-12 NOTE — Progress Notes (Signed)
Virtual Visit via telephone Note Due to COVID-19 pandemic this visit was conducted virtually. This visit type was conducted due to national recommendations for restrictions regarding the COVID-19 Pandemic (e.g. social distancing, sheltering in place) in an effort to limit this patient's exposure and mitigate transmission in our community. All issues noted in this document were discussed and addressed.  A physical exam was not performed with this format.   I connected with [name] on 11/12/2019 at 0830 by telephone and verified that I am speaking with the correct person using two identifiers. Renee [name] is currently located at home and no one is currently with them during visit. The provider, Monia Pouch, FNP is located in their office at time of visit.  I discussed the limitations, risks, security and privacy concerns of performing an evaluation and management service by telephone and the availability of in person appointments. I also discussed with the patient that there may be a patient responsible charge related to this service. The patient expressed understanding and agreed to proceed.  Subjective:  Patient ID: Renee [name], female    DOB: 01/29/1951, 68 y.o.   MRN: [id number]  Chief Complaint:  URI   HPI: Renee [name] is a 68 y.o. female presenting on 11/12/2019 for URI   Pt reports several days of cough, congestion, chills, malaise, and sputum production. Pt states she has not been anywhere or around anyone sick. States she has been sheltering in place. States she started feeling bad about a week ago and the symptoms got worse on Saturday. States when she coughs she has pain in her sides due to the coughing. She denies abdominal pain, rash, or chest pain. States she does have shortness of breath with her coughing spells. She has not taken anything for the symptoms because she does not have anything to take.   URI  This is a new problem. The current episode started 1  to 4 weeks ago. The problem has been gradually worsening. Associated symptoms include congestion, coughing and wheezing. Pertinent negatives include no abdominal pain, chest pain, diarrhea, dysuria, ear pain, headaches, joint pain, joint swelling, nausea, neck pain, plugged ear sensation, rash, rhinorrhea, sinus pain, sneezing, sore throat, swollen glands or vomiting. She has tried nothing for the symptoms.     Relevant past medical, surgical, family, and social history reviewed and updated as indicated.  Allergies and medications reviewed and updated.   Past Medical History:  Diagnosis Date  . Arthritis   . Breast cancer ([place]) 2001   right mastectomy  . CVA (cerebral infarction) 2003, 2005  . DM (diabetes mellitus) ([place])   . GERD (gastroesophageal reflux disease)   . Heart murmur   . Hyperlipidemia   . Hypertension   . Seizures ([place])   . Vitamin D deficiency     Past Surgical History:  Procedure Laterality Date  . ABDOMINAL HYSTERECTOMY  2000  . GANGLION CYST EXCISION  2010   right (2010)  and left (before 2010) hand  . MASTECTOMY  2001   right  . TONSILECTOMY/ADENOIDECTOMY WITH MYRINGOTOMY Bilateral    age 69's    Social History   Socioeconomic History  . Marital status: Single    Spouse name: Not on file  . Number of children: Not on file  . Years of education: 25  . Highest education level: Associate degree: academic program  Occupational History  . Occupation: Retired    Fish farm manager: [employer]: [employer]   Social Needs  . Financial resource strain: Not hard at all  . Food insecurity    Worry: Never true    Inability: Never true  . Transportation needs    Medical: No    Non-medical: No  Tobacco Use  . Smoking status: Never Smoker  . Smokeless tobacco: Never Used  Substance and Sexual Activity  . Alcohol use: Not Currently    Alcohol/week: 1.0 standard drinks    Types: 1 Glasses of wine per week  . Drug  use: No  . Sexual activity: Yes    Birth control/protection: Post-menopausal  Lifestyle  . Physical activity    Days per week: 3 days    Minutes per session: 60 min  . Stress: Only a little  Relationships  . Social connections    Talks on phone: More than three times a week    Gets together: More than three times a week    Attends religious service: More than 4 times per year    Active member of club or organization: Yes    Attends meetings of clubs or organizations: More than 4 times per year    Relationship status: Never married  . Intimate partner violence    Fear of current or ex partner: No    Emotionally abused: No    Physically abused: No    Forced sexual activity: No  Other Topics Concern  . Not on file  Social History Narrative  . Not on file    Outpatient Encounter Medications as of 11/12/2019  Medication Sig  . albuterol (VENTOLIN HFA) 108 (90 Base) MCG/ACT inhaler Inhale 2 puffs into the lungs every 6 (six) hours as needed for wheezing or shortness of breath.  Marland Kitchen aspirin 81 MG tablet Take 81 mg by mouth daily.  Marland Kitchen atorvastatin (LIPITOR) 40 MG tablet Take 1 tablet (40 mg total) by mouth daily.  . benzonatate (TESSALON PERLES) 100 MG capsule Take 1 capsule (100 mg total) by mouth 3 (three) times daily as needed for cough.  . bisoprolol-hydrochlorothiazide (ZIAC) 2.5-6.25 MG tablet Take 1 tablet by mouth daily. (Needs to be seen before next refill)  . Blood Glucose Calibration (ONETOUCH VERIO) SOLN Use as needed to calibrate glucometer  . Blood Glucose Monitoring Suppl ([place]) w/Device KIT Use to check blood sugar twice daily  . docusate sodium (COLACE) 100 MG capsule Take 100 mg by mouth daily.  Marland Kitchen doxycycline (VIBRA-TABS) 100 MG tablet Take 1 tablet (100 mg total) by mouth 2 (two) times daily for 10 days. 1 po bid  . gabapentin (NEURONTIN) 400 MG capsule Take 1 capsule (400 mg total) by mouth 3 (three) times daily.  Marland Kitchen glipiZIDE (GLUCOTROL XL) 5 MG  24 hr tablet Take 1 tablet (5 mg total) by mouth daily with breakfast.  . glucose blood (ONETOUCH VERIO) test strip Use to check blood sugar twice daily  . insulin degludec (TRESIBA FLEXTOUCH) 100 UNIT/ML SOPN FlexTouch Pen Inject 0.3 mLs (30 Units total) into the skin at bedtime.  . Multiple Vitamin (MULTIVITAMIN WITH MINERALS) TABS tablet Take 1 tablet by mouth daily.  Marland Kitchen omeprazole (PRILOSEC) 20 MG capsule Take 1 capsule (20 mg total) by mouth daily.  [name] DELICA LANCETS 62I MISC Use to check BG up to bid.  Dx: E11.65 type 2 DM with insulin therapy, uncontrolled  . sitaGLIPtin (JANUVIA) 100 MG tablet Take 100 mg by mouth daily.  . Vitamin D, Ergocalciferol, (DRISDOL) 1.25 MG (50000 UT) CAPS capsule Take  1 capsule (50,000 Units total) by mouth every 7 (seven) days.   No facility-administered encounter medications on file as of 11/12/2019.     Allergies  Allergen Reactions  . Metformin And Related Other (See Comments)    seizures  . Penicillins     Review of Systems  Constitutional: Positive for activity change, appetite change and chills. Negative for diaphoresis, fatigue, fever and unexpected weight change.  HENT: Positive for congestion. Negative for ear pain, postnasal drip, rhinorrhea, sinus pressure, sinus pain, sneezing and sore throat.   Eyes: Negative.   Respiratory: Positive for cough, shortness of breath and wheezing. Negative for apnea, choking, chest tightness and stridor.   Cardiovascular: Negative for chest pain, palpitations and leg swelling.  Gastrointestinal: Negative for abdominal pain, blood in stool, constipation, diarrhea, nausea and vomiting.  Endocrine: Negative.   Genitourinary: Negative for decreased urine volume, difficulty urinating, dysuria, frequency and urgency.  Musculoskeletal: Negative for arthralgias, joint pain, myalgias and neck pain.  Skin: Negative.  Negative for rash.  Allergic/Immunologic: Negative.   Neurological: Negative for dizziness,  weakness and headaches.  Hematological: Negative.   Psychiatric/Behavioral: Negative for confusion, hallucinations, sleep disturbance and suicidal ideas.  All other systems reviewed and are negative.        Observations/Objective: No vital signs or physical exam, this was a telephone or virtual health encounter.  Pt alert and oriented, answers all questions appropriately, and able to speak in full sentences.    Assessment and Plan: Storm was seen today for uri.  Diagnoses and all orders for this visit:  URI with cough and congestion Reported symptoms concerning for bacterial URI and due to pts history of uncontrolled DM, will initiate doxycycline along with tessalon and albuterol inhaler. Pt aware of symptoms that require emergent evaluation and treatment. Pt aware to report any new or worsening symptoms. Medications as prescribed.  -     benzonatate (TESSALON PERLES) 100 MG capsule; Take 1 capsule (100 mg total) by mouth 3 (three) times daily as needed for cough. -     doxycycline (VIBRA-TABS) 100 MG tablet; Take 1 tablet (100 mg total) by mouth 2 (two) times daily for 10 days. 1 po bid -     albuterol (VENTOLIN HFA) 108 (90 Base) MCG/ACT inhaler; Inhale 2 puffs into the lungs every 6 (six) hours as needed for wheezing or shortness of breath.     Follow Up Instructions: Return if symptoms worsen or fail to improve.    I discussed the assessment and treatment plan with the patient. The patient was provided an opportunity to ask questions and all were answered. The patient agreed with the plan and demonstrated an understanding of the instructions.   The patient was advised to call back or seek an in-person evaluation if the symptoms worsen or if the condition fails to improve as anticipated.  The above assessment and management plan was discussed with the patient. The patient verbalized understanding of and has agreed to the management plan. Patient is aware to call the clinic if  they develop any new symptoms or if symptoms persist or worsen. Patient is aware when to return to the clinic for a follow-up visit. Patient educated on when it is appropriate to go to the emergency department.    I provided 15 minutes of non-face-to-face time during this encounter. The call started at 0830. The call ended at [place]. The other time was used for coordination of care.    Monia Pouch, FNP-C [facility] [phone]  [address] [place], [place] [address] [phone] 11/12/2019

## 2019-11-24 ENCOUNTER — Other Ambulatory Visit: Payer: Self-pay | Admitting: Family

## 2020-01-03 ENCOUNTER — Telehealth: Payer: Self-pay | Admitting: Family

## 2020-01-03 NOTE — Telephone Encounter (Signed)
Pt will be in on Monday d/t impending weather to sign pt assistance form and to call & get her income information faxed while she is here

## 2020-01-07 ENCOUNTER — Other Ambulatory Visit: Payer: Self-pay | Admitting: Family

## 2020-01-25 ENCOUNTER — Encounter: Payer: Self-pay | Admitting: "Endocrinology

## 2020-01-25 ENCOUNTER — Other Ambulatory Visit: Payer: Self-pay

## 2020-01-25 ENCOUNTER — Ambulatory Visit (INDEPENDENT_AMBULATORY_CARE_PROVIDER_SITE_OTHER): Payer: Medicare Other | Admitting: "Endocrinology

## 2020-01-25 VITALS — BP 133/76 | HR 66 | Ht 66.0 in | Wt 176.6 lb

## 2020-01-25 DIAGNOSIS — E782 Mixed hyperlipidemia: Secondary | ICD-10-CM

## 2020-01-25 DIAGNOSIS — E1165 Type 2 diabetes mellitus with hyperglycemia: Secondary | ICD-10-CM | POA: Diagnosis not present

## 2020-01-25 DIAGNOSIS — I10 Essential (primary) hypertension: Secondary | ICD-10-CM

## 2020-01-25 DIAGNOSIS — IMO0002 Reserved for concepts with insufficient information to code with codable children: Secondary | ICD-10-CM

## 2020-01-25 LAB — POCT GLYCOSYLATED HEMOGLOBIN (HGB A1C): Hemoglobin A1C: 8.1 % — AB (ref 4.0–5.6)

## 2020-01-25 MED ORDER — TRESIBA FLEXTOUCH 100 UNIT/ML ~~LOC~~ SOPN: 40.0000 [IU] | PEN_INJECTOR | Freq: Every day | SUBCUTANEOUS | 2 refills | Status: DC

## 2020-01-25 NOTE — Progress Notes (Signed)
01/25/2020, 12:12 PM   Endocrinology follow-up note   Subjective:    Patient ID: Renee [name], female    DOB: [birth year].  Renee [name] is being seen in  follow-up for the management of her currently uncontrolled type 2 diabetes, hyperlipidemia, hypertension.  PCP:   Sharion Balloon, FNP.   Past Medical History:  Diagnosis Date  . Arthritis   . Breast cancer ([place]) 2001   right mastectomy  . CVA (cerebral infarction) 2003, 2005  . DM (diabetes mellitus) ([place])   . GERD (gastroesophageal reflux disease)   . Heart murmur   . Hyperlipidemia   . Hypertension   . Seizures ([place])   . Vitamin D deficiency    Past Surgical History:  Procedure Laterality Date  . ABDOMINAL HYSTERECTOMY  2000  . GANGLION CYST EXCISION  2010   right (2010)  and left (before 2010) hand  . MASTECTOMY  2001   right  . TONSILECTOMY/ADENOIDECTOMY WITH MYRINGOTOMY Bilateral    age 64's   Social History   Socioeconomic History  . Marital status: Single    Spouse name: Not on file  . Number of children: Not on file  . Years of education: 81  . Highest education level: Associate degree: academic program  Occupational History  . Occupation: Retired    Fish farm manager: [employer]    Comment: [employer] [facility] [employer]   Tobacco Use  . Smoking status: Never Smoker  . Smokeless tobacco: Never Used  Substance and Sexual Activity  . Alcohol use: Not Currently    Alcohol/week: 1.0 standard drinks    Types: 1 Glasses of wine per week  . Drug use: No  . Sexual activity: Yes    Birth control/protection: Post-menopausal  Other Topics Concern  . Not on file  Social History Narrative  . Not on file   Social Determinants of Health   Financial Resource Strain: Low Risk   . Difficulty of Paying Living Expenses: Not hard at all  Food Insecurity: No Food Insecurity  . Worried About Charity fundraiser in the  Last Year: Never true  . Ran Out of Food in the Last Year: Never true  Transportation Needs: No Transportation Needs  . Lack of Transportation (Medical): No  . Lack of Transportation (Non-Medical): No  Physical Activity: Sufficiently Active  . Days of Exercise per Week: 3 days  . Minutes of Exercise per Session: 60 min  Stress: No Stress Concern Present  . Feeling of Stress : Only a little  Social Connections: Slightly Isolated  . Frequency of Communication with Friends and Family: More than three times a week  . Frequency of Social Gatherings with Friends and Family: More than three times a week  . Attends Religious Services: More than 4 times per year  . Active Member of Clubs or Organizations: Yes  . Attends Archivist Meetings: More than 4 times per year  . Marital Status: Never married   Outpatient Encounter Medications as of 01/25/2020  Medication Sig  . aspirin 81 MG tablet Take 81 mg by mouth daily.  Marland Kitchen atorvastatin (LIPITOR) 40 MG tablet Take 1 tablet (40 mg total) by  mouth daily.  . bisoprolol-hydrochlorothiazide (ZIAC) 2.5-6.25 MG tablet Take 1 tablet by mouth daily. (Needs to be seen before next refill)  . Blood Glucose Calibration (ONETOUCH VERIO) SOLN Use as needed to calibrate glucometer  . Blood Glucose Monitoring Suppl ([place]) w/Device KIT Use to check blood sugar twice daily  . docusate sodium (COLACE) 100 MG capsule Take 100 mg by mouth daily.  Marland Kitchen gabapentin (NEURONTIN) 400 MG capsule Take 1 capsule (400 mg total) by mouth 3 (three) times daily.  Marland Kitchen glipiZIDE (GLUCOTROL XL) 5 MG 24 hr tablet Take 1 tablet (5 mg total) by mouth daily with breakfast.  . glucose blood (ONETOUCH VERIO) test strip Test glucose twice daily Dx E11.65  . insulin degludec (TRESIBA FLEXTOUCH) 100 UNIT/ML SOPN FlexTouch Pen Inject 0.4 mLs (40 Units total) into the skin at bedtime.  . Multiple Vitamin (MULTIVITAMIN WITH MINERALS) TABS tablet Take 1 tablet by mouth  daily.  Marland Kitchen omeprazole (PRILOSEC) 20 MG capsule Take 1 capsule (20 mg total) by mouth daily.  [name] DELICA LANCETS 71I MISC Use to check BG up to bid.  Dx: E11.65 type 2 DM with insulin therapy, uncontrolled  . sitaGLIPtin (JANUVIA) 100 MG tablet Take 100 mg by mouth daily.  . Vitamin D, Ergocalciferol, (DRISDOL) 1.25 MG (50000 UT) CAPS capsule Take 1 capsule (50,000 Units total) by mouth every 7 (seven) days.  . [DISCONTINUED] albuterol (VENTOLIN HFA) 108 (90 Base) MCG/ACT inhaler Inhale 2 puffs into the lungs every 6 (six) hours as needed for wheezing or shortness of breath.  . [DISCONTINUED] benzonatate (TESSALON PERLES) 100 MG capsule Take 1 capsule (100 mg total) by mouth 3 (three) times daily as needed for cough.  . [DISCONTINUED] insulin degludec (TRESIBA FLEXTOUCH) 100 UNIT/ML SOPN FlexTouch Pen Inject 0.3 mLs (30 Units total) into the skin at bedtime.   No facility-administered encounter medications on file as of 01/25/2020.    ALLERGIES: Allergies  Allergen Reactions  . Metformin And Related Other (See Comments)    seizures  . Penicillins     VACCINATION STATUS:  There is no immunization history on file for this patient.  Diabetes She presents for her follow-up diabetic visit. She has type 2 diabetes mellitus. Onset time: Patient says she was diagnosed last year, however her A1c has been high enough for diabetes at least since 2015.  Have A1c is as high as greater than 14%. Her disease course has been improving. There are no hypoglycemic associated symptoms. Pertinent negatives for hypoglycemia include no confusion, headaches, pallor or seizures. Associated symptoms include polydipsia and polyuria. Pertinent negatives for diabetes include no chest pain, no fatigue and no polyphagia. There are no hypoglycemic complications. Symptoms are improving. Diabetic complications include peripheral neuropathy. Risk factors for coronary artery disease include dyslipidemia, diabetes mellitus,  family history, hypertension, sedentary lifestyle and post-menopausal. Current diabetic treatments: She is currently on Tresiba 23 units nightly, glipizide 10 mg p.o. twice daily, Januvia 100 mg p.o. once a day.  She does not tolerate metformin. She is compliant with treatment most of the time. Her weight is fluctuating minimally. She is following a generally unhealthy diet. When asked about meal planning, she reported none. She has not had a previous visit with a dietitian. She never participates in exercise. Her home blood glucose trend is decreasing steadily. Her breakfast blood glucose range is generally 140-180 mg/dl. Her bedtime blood glucose range is generally 180-200 mg/dl. Her overall blood glucose range is 180-200 mg/dl. An ACE inhibitor/angiotensin II receptor blocker  is not being taken.  Hyperlipidemia This is a chronic problem. The current episode started more than 1 year ago. The problem is controlled. Exacerbating diseases include diabetes. Pertinent negatives include no chest pain, myalgias or shortness of breath. Current antihyperlipidemic treatment includes statins. Risk factors for coronary artery disease include diabetes mellitus, dyslipidemia, hypertension, a sedentary lifestyle and post-menopausal.  Hypertension This is a chronic problem. The current episode started more than 1 year ago. The problem is controlled. Pertinent negatives include no chest pain, headaches, palpitations or shortness of breath. Risk factors for coronary artery disease include dyslipidemia, diabetes mellitus, sedentary lifestyle, family history and post-menopausal state. Past treatments include beta blockers.   Review of systems Constitutional: + Steady weight,  no fatigue, no subjective hyperthermia, no subjective hypothermia Eyes: no blurry vision, no xerophthalmia ENT: no sore throat, no nodules palpated in throat, no dysphagia/odynophagia, no hoarseness Cardiovascular: no Chest Pain, no Shortness of  Breath, no palpitations, no leg swelling Respiratory: no cough, no SOB Gastrointestinal: no Nausea/Vomiting/Diarhhea Musculoskeletal: no muscle/joint aches Skin: no rashes Neurological: no tremors, no numbness, no tingling, no dizziness Psychiatric: no depression, no anxiety   Objective:    BP 133/76   Pulse 66   Ht '5\' 6"'  (1.676 m)   Wt 176 lb 9.6 oz (80.1 kg)   BMI 28.50 kg/m   Wt Readings from Last 3 Encounters:  01/25/20 176 lb 9.6 oz (80.1 kg)  11/05/19 177 lb 6.4 oz (80.5 kg)  05/22/19 173 lb (78.5 kg)     Physical Exam- Limited  Constitutional:  Body mass index is 28.5 kg/m. , not in acute distress, normal state of mind Eyes:  EOMI, no exophthalmos Neck: Supple Respiratory: Adequate breathing efforts Musculoskeletal: no gross deformities, strength intact in all four extremities, no gross restriction of joint movements Skin:  no rashes, no hyperemia Neurological: no tremor with outstretched hands.   CMP ( most recent) CMP     Component Value Date/Time   NA 139 11/05/2019 1236   K 4.5 11/05/2019 1236   CL 104 11/05/2019 1236   CO2 22 11/05/2019 1236   GLUCOSE 164 (H) 11/05/2019 1236   GLUCOSE 185 (H) 01/15/2016 0046   BUN 14 11/05/2019 1236   CREATININE 0.92 11/05/2019 1236   CALCIUM 10.1 11/05/2019 1236   PROT 7.2 11/05/2019 1236   ALBUMIN 4.3 11/05/2019 1236   AST 20 11/05/2019 1236   ALT 23 11/05/2019 1236   ALKPHOS 105 11/05/2019 1236   BILITOT 0.3 11/05/2019 1236   GFRNONAA 64 11/05/2019 1236   GFRAA 73 11/05/2019 1236     Diabetic Labs (most recent): Lab Results  Component Value Date   HGBA1C 8.1 (A) 01/25/2020   HGBA1C 8.2 (H) 11/05/2019   HGBA1C 9.4 (H) 09/18/2019     Lipid Panel ( most recent) Lipid Panel     Component Value Date/Time   CHOL 146 11/05/2019 1236   TRIG 87 11/05/2019 1236   HDL 47 11/05/2019 1236   CHOLHDL 3.1 11/05/2019 1236   LDLCALC 82 11/05/2019 1236      Lab Results  Component Value Date   TSH 2.710  07/05/2016       Assessment & Plan:   1. Uncontrolled type 2 diabetes mellitus, without long-term current use of insulin ([place])  - Renee [name] has currently uncontrolled symptomatic type 2 DM since approximately age of 69 years of age. -She presents with improving, still slightly above target glycemic profile both fasting and postprandial.  Her point-of-care A1c is 8.1%,  improving from 9.4%.    She has no documented or reported hypoglycemia.     -Recent labs are reviewed with her.     -her diabetes is complicated by peripheral neuropathy and she remains at a high risk for more acute and chronic complications which include CAD, CVA, CKD, retinopathy, and neuropathy. These are all discussed in detail with her.  - I have counseled her on diet management by adopting a carbohydrate restricted/protein rich diet.  - she  admits there is a room for improvement in her diet and drink choices. -  Suggestion is made for her to avoid simple carbohydrates  from her diet including Cakes, Sweet Desserts / Pastries, Ice Cream, Soda (diet and regular), Sweet Tea, Candies, Chips, Cookies, Sweet Pastries,  Store Bought Juices, Alcohol in Excess of  1-2 drinks a day, Artificial Sweeteners, Coffee Creamer, and "Sugar-free" Products. This will help patient to have stable blood glucose profile and potentially avoid unintended weight gain.   - I encouraged her to switch to  unprocessed or minimally processed complex starch and increased protein intake (animal or plant source), fruits, and vegetables.  - she is advised to stick to a routine mealtimes to eat 3 meals  a day and avoid unnecessary snacks ( to snack only to correct hypoglycemia).   - she has been scheduled  with Jearld Fenton, RDN, CDE for individualized diabetes education.  - I have approached her with the following individualized plan to manage diabetes and patient agrees:   -She will require higher dose of insulin in order for her to  achieve and maintain control of diabetes to target.  -She is advised to increase her Tresiba to 40 units nightly,   continue to monitor blood glucose 2 times a day-daily before breakfast and at bedtime.   - she is encouraged to call clinic for blood glucose levels less than 70 or above 300 mg /dl. -She reports significant intolerance to metformin and does not wish to resume. -She is advised to continue Januvia 100 mg daily p.o. with breakfast, along with glipizide 5 mg XL p.o. daily at breakfast.    -She is not a candidate for SGLT2 inhibitors nor GLP-1 receptor agonist.   - Patient specific target  A1c;  LDL, HDL, Triglycerides,  were discussed in detail.  2) Blood Pressure /Hypertension:  her blood pressure is not controlled to target.  She is advised to continue  her current medications including bisoprolol-hydrochlorothiazide 12.5-6.25 p.o. twice daily.   3) Lipids/Hyperlipidemia:   Review of her recent lipid panel showed  controlled  LDL at 67 .  she  is advised to continue simvastatin 40 mg p.o. daily at bedtime.     Side effects and precautions discussed with her.  4)  Weight/Diet: Her BMI is 28.6--She is not a candidate for major weight loss.  I discussed with her the fact that loss of 5 - 10% of her  current body weight will have the most impact on her diabetes management.  CDE Consult will be initiated . Exercise, and detailed carbohydrates information provided  -  detailed on discharge instructions.  5) Chronic Care/Health Maintenance:  -she  is on statin medications and  is encouraged to initiate and continue to follow up with Ophthalmology, Dentist,  Podiatrist at least yearly or according to recommendations, and advised to  stay away from smoking. I have recommended yearly flu vaccine and pneumonia vaccine at least every 5 years; moderate intensity exercise for up to 150 minutes weekly; and  sleep for at least 7 hours a day.  - she is  advised to maintain close follow up with  Sharion Balloon, FNP for primary care needs, as well as her other providers for optimal and coordinated care.  - Time spent on this patient care encounter:  35 min, of which > 50% was spent in  counseling and the rest reviewing her blood glucose logs , discussing her hypoglycemia and hyperglycemia episodes, reviewing her current and  previous labs / studies  ( including abstraction from other facilities) and medications  doses and developing a  long term treatment plan and documenting her care.   Please refer to Patient Instructions for Blood Glucose Monitoring and Insulin/Medications Dosing Guide"  in media tab for additional information. Please  also refer to " Patient Self Inventory" in the Media  tab for reviewed elements of pertinent patient history.  [name] participated in the discussions, expressed understanding, and voiced agreement with the above plans.  All questions were answered to her satisfaction. she is encouraged to contact clinic should she have any questions or concerns prior to her return visit.   Follow up plan: - Return in about 3 months (around 04/24/2020) for Bring Meter and Logs- A1c in Office.  Glade Lloyd, MD [facility] [address] [place], [place] [address] Phone: [phone]  Fax: [phone]    01/25/2020, 12:12 PM  This note was partially dictated with voice recognition software. Similar sounding words can be transcribed inadequately or may not  be corrected upon review.

## 2020-01-25 NOTE — Patient Instructions (Signed)
                                     Advice for Weight Management  -For most of us the best way to lose weight is by diet management. Generally speaking, diet management means consuming less calories intentionally which over time brings about progressive weight loss.  This can be achieved more effectively by restricting carbohydrate consumption to the minimum possible.  So, it is critically important to know your numbers: how much calorie you are consuming and how much calorie you need. More importantly, our carbohydrates sources should be unprocessed or minimally processed complex starch food items.   Sometimes, it is important to balance nutrition by increasing protein intake (animal or plant source), fruits, and vegetables.  -Sticking to a routine mealtime to eat 3 meals a day and avoiding unnecessary snacks is shown to have a big role in weight control. Under normal circumstances, the only time we lose real weight is when we are hungry, so allow hunger to take place- hunger means no food between meal times, only water.  It is not advisable to starve.   -It is better to avoid simple carbohydrates including: Cakes, Sweet Desserts, Ice Cream, Soda (diet and regular), Sweet Tea, Candies, Chips, Cookies, Store Bought Juices, Alcohol in Excess of  1-2 drinks a day, Artificial Sweeteners, Doughnuts, Coffee Creamers, "Sugar-free" Products, etc, etc.  This is not a complete list.....    -Consulting with certified diabetes educators is proven to provide you with the most accurate and current information on diet.  Also, you may be  interested in discussing diet options/exchanges , we can schedule a visit with Penny Crumpton, RDN, CDE for individualized nutrition education.  -Exercise: If you are able: 30 -60 minutes a day ,4 days a week, or 150 minutes a week.  The longer the better.  Combine stretch, strength, and aerobic activities.  If you were told in the past that you  have high risk for cardiovascular diseases, you may seek evaluation by your heart doctor prior to initiating moderate to intense exercise programs.                                  Additional Care Considerations for Diabetes   -Diabetes  is a chronic disease.  The most important care consideration is regular follow-up with your diabetes care provider with the goal being avoiding or delaying its complications and to take advantage of advances in medications and technology.    -Type 2 diabetes is known to coexist with other important comorbidities such as high blood pressure and high cholesterol.  It is critical to control not only the diabetes but also the high blood pressure and high cholesterol to minimize and delay the risk of complications including coronary artery disease, stroke, amputations, blindness, etc.    - Studies showed that people with diabetes will benefit from a class of medications known as ACE inhibitors and statins.  Unless there are specific reasons not to be on these medications, the standard of care is to consider getting one from these groups of medications at an optimal doses.  These medications are generally considered safe and proven to help protect the heart and the kidneys.    - People with diabetes are encouraged to initiate and maintain regular follow-up with eye doctors, foot doctors, dentists ,   and if necessary heart and kidney doctors.     - It is highly recommended that people with diabetes quit smoking or stay away from smoking, and get yearly  flu vaccine and pneumonia vaccine at least every 5 years.  One other important lifestyle recommendation is to ensure adequate sleep - at least 6-7 hours of uninterrupted sleep at night.  -Exercise: If you are able: 30 -60 minutes a day, 4 days a week, or 150 minutes a week.  The longer the better.  Combine stretch, strength, and aerobic activities.  If you were told in the past that you have high risk for cardiovascular  diseases, you may seek evaluation by your heart doctor prior to initiating moderate to intense exercise programs.     COVID-19 Vaccine Information can be found at: [email or web address] For questions related to vaccine distribution or appointments, please email [email or web address] or call [phone].        

## 2020-01-30 ENCOUNTER — Telehealth: Payer: Self-pay | Admitting: Family

## 2020-01-30 NOTE — Telephone Encounter (Signed)
Explained to pt that the medication would come to her and not our office

## 2020-01-30 NOTE — Telephone Encounter (Signed)
Pt aware that I called [employer], they have not received the enrollment form. They have asked me to call back by Monday

## 2020-02-04 ENCOUNTER — Other Ambulatory Visit: Payer: Self-pay

## 2020-02-05 ENCOUNTER — Ambulatory Visit: Payer: Medicare Other | Admitting: Family

## 2020-02-05 DIAGNOSIS — L84 Corns and callosities: Secondary | ICD-10-CM | POA: Diagnosis not present

## 2020-02-05 DIAGNOSIS — E1151 Type 2 diabetes mellitus with diabetic peripheral angiopathy without gangrene: Secondary | ICD-10-CM | POA: Diagnosis not present

## 2020-02-05 DIAGNOSIS — B351 Tinea unguium: Secondary | ICD-10-CM | POA: Diagnosis not present

## 2020-02-05 DIAGNOSIS — M79676 Pain in unspecified toe(s): Secondary | ICD-10-CM | POA: Diagnosis not present

## 2020-02-13 ENCOUNTER — Telehealth: Payer: Self-pay | Admitting: Family

## 2020-02-14 ENCOUNTER — Encounter: Payer: Self-pay | Admitting: Family

## 2020-02-14 ENCOUNTER — Other Ambulatory Visit: Payer: Self-pay

## 2020-02-14 ENCOUNTER — Ambulatory Visit (INDEPENDENT_AMBULATORY_CARE_PROVIDER_SITE_OTHER): Payer: Medicare Other | Admitting: Family

## 2020-02-14 VITALS — BP 133/76

## 2020-02-14 DIAGNOSIS — I152 Hypertension secondary to endocrine disorders: Secondary | ICD-10-CM

## 2020-02-14 DIAGNOSIS — Z8673 Personal history of transient ischemic attack (TIA), and cerebral infarction without residual deficits: Secondary | ICD-10-CM

## 2020-02-14 DIAGNOSIS — E663 Overweight: Secondary | ICD-10-CM | POA: Diagnosis not present

## 2020-02-14 DIAGNOSIS — E782 Mixed hyperlipidemia: Secondary | ICD-10-CM | POA: Diagnosis not present

## 2020-02-14 DIAGNOSIS — E1142 Type 2 diabetes mellitus with diabetic polyneuropathy: Secondary | ICD-10-CM | POA: Diagnosis not present

## 2020-02-14 DIAGNOSIS — E1169 Type 2 diabetes mellitus with other specified complication: Secondary | ICD-10-CM

## 2020-02-14 DIAGNOSIS — K5900 Constipation, unspecified: Secondary | ICD-10-CM | POA: Diagnosis not present

## 2020-02-14 DIAGNOSIS — I10 Essential (primary) hypertension: Secondary | ICD-10-CM | POA: Diagnosis not present

## 2020-02-14 DIAGNOSIS — K2100 Gastro-esophageal reflux disease with esophagitis, without bleeding: Secondary | ICD-10-CM | POA: Diagnosis not present

## 2020-02-14 DIAGNOSIS — E1165 Type 2 diabetes mellitus with hyperglycemia: Secondary | ICD-10-CM | POA: Diagnosis not present

## 2020-02-14 DIAGNOSIS — E1159 Type 2 diabetes mellitus with other circulatory complications: Secondary | ICD-10-CM | POA: Diagnosis not present

## 2020-02-14 DIAGNOSIS — E785 Hyperlipidemia, unspecified: Secondary | ICD-10-CM

## 2020-02-14 DIAGNOSIS — IMO0002 Reserved for concepts with insufficient information to code with codable children: Secondary | ICD-10-CM

## 2020-02-14 NOTE — Progress Notes (Signed)
Virtual Visit via telephone Note Due to COVID-19 pandemic this visit was conducted virtually. This visit type was conducted due to national recommendations for restrictions regarding the COVID-19 Pandemic (e.g. social distancing, sheltering in place) in an effort to limit this patient's exposure and mitigate transmission in our community. All issues noted in this document were discussed and addressed.  A physical exam was not performed with this format.  I connected with Renee [name] on 02/14/20 at 11:00 pm by telephone and verified that I am speaking with the correct person using two identifiers. Renee [name] is currently located at Home and no one  is currently with her during visit. The provider, Evelina Dun, FNP is located in their office at time of visit.  I discussed the limitations, risks, security and privacy concerns of performing an evaluation and management service by telephone and the availability of in person appointments. I also discussed with the patient that there may be a patient responsible charge related to this service. The patient expressed understanding and agreed to proceed.   History and Present Illness:  PT presents to the office today for chronic follow up.Pt has hx CVA. She is followed by Diabetic Educator that is helping with her low carb diet. Diabetes She presents for her follow-up diabetic visit. She has type 2 diabetes mellitus. Her disease course has been stable. There are no hypoglycemic associated symptoms. Associated symptoms include foot paresthesias. Pertinent negatives for diabetes include no blurred vision. There are no hypoglycemic complications. Symptoms are stable. Diabetic complications include a CVA and heart disease. Risk factors for coronary artery disease include dyslipidemia, diabetes mellitus, hypertension, family history, obesity, post-menopausal and sedentary lifestyle. She is following a generally unhealthy diet. Her overall blood glucose  range is >200 mg/dl. An ACE inhibitor/angiotensin II receptor blocker is being taken.  Hypertension This is a chronic problem. The current episode started more than 1 year ago. The problem has been resolved since onset. The problem is controlled. Pertinent negatives include no blurred vision, malaise/fatigue, peripheral edema or shortness of breath. Risk factors for coronary artery disease include obesity, dyslipidemia, diabetes mellitus and family history. The current treatment provides moderate improvement. Hypertensive end-organ damage includes CVA.  Hyperlipidemia This is a chronic problem. The current episode started more than 1 year ago. The problem is controlled. Recent lipid tests were reviewed and are normal. Exacerbating diseases include obesity. Pertinent negatives include no shortness of breath. Current antihyperlipidemic treatment includes statins. The current treatment provides moderate improvement of lipids. Risk factors for coronary artery disease include dyslipidemia, hypertension, diabetes mellitus, a sedentary lifestyle and post-menopausal.  Gastroesophageal Reflux She complains of belching and heartburn. She reports no coughing. This is a chronic problem. The current episode started more than 1 year ago. The problem occurs occasionally. Risk factors include obesity. She has tried a PPI for the symptoms. The treatment provided moderate relief.  Constipation This is a chronic problem. The current episode started more than 1 year ago. The problem has been waxing and waning since onset. Her stool frequency is 4 to 5 times per week. She has tried stool softeners for the symptoms. The treatment provided moderate relief.  Diabetic Neuropathy Pt taking taking Gabapentin 400 mg tid. She reports intermittent burning pain in bilateral feet.     Review of Systems  Constitutional: Negative for malaise/fatigue.  Eyes: Negative for blurred vision.  Respiratory: Negative for cough and shortness  of breath.   Gastrointestinal: Positive for constipation and heartburn.  All other systems  reviewed and are negative.    Observations/Objective: No SOB or distress noted   Assessment and Plan: [name] comes in today with chief complaint of No chief complaint on file.   Diagnosis and orders addressed:  1. Essential hypertension, benign - CMP14+EGFR; Future - CBC with Differential/Platelet; Future  2. Hypertension associated with diabetes ([place]) - CMP14+EGFR; Future - CBC with Differential/Platelet; Future  3. Gastroesophageal reflux disease with esophagitis, unspecified whether hemorrhage - CMP14+EGFR; Future - CBC with Differential/Platelet; Future  4. Hyperlipidemia associated with type 2 diabetes mellitus (HCC) - CMP14+EGFR; Future - CBC with Differential/Platelet; Future  5. Uncontrolled type 2 diabetes mellitus, without long-term current use of insulin (HCC) - CMP14+EGFR; Future - CBC with Differential/Platelet; Future - Bayer DCA Hb A1c Waived; Future  6. Diabetic peripheral neuropathy (HCC) - CMP14+EGFR; Future - CBC with Differential/Platelet; Future - Bayer DCA Hb A1c Waived; Future  7. Constipation, unspecified constipation type - CMP14+EGFR; Future - CBC with Differential/Platelet; Future  8. History of CVA (cerebrovascular accident) - CMP14+EGFR; Future - CBC with Differential/Platelet; Future  9. Mixed hyperlipidemia  - CMP14+EGFR; Future - CBC with Differential/Platelet; Future  10. Overweight (BMI 25.0-29.9) - CMP14+EGFR; Future - CBC with Differential/Platelet; Future   Labs pending Health Maintenance reviewed Diet and exercise encouraged  Follow up plan: 3 months       I discussed the assessment and treatment plan with the patient. The patient was provided an opportunity to ask questions and all were answered. The patient agreed with the plan and demonstrated an understanding of the instructions.   The patient was advised  to call back or seek an in-person evaluation if the symptoms worsen or if the condition fails to improve as anticipated.  The above assessment and management plan was discussed with the patient. The patient verbalized understanding of and has agreed to the management plan. Patient is aware to call the clinic if symptoms persist or worsen. Patient is aware when to return to the clinic for a follow-up visit. Patient educated on when it is appropriate to go to the emergency department.   Time call ended: 22:25 AM   I provided 25 minutes of non-face-to-face time during this encounter.    Evelina Dun, FNP

## 2020-02-19 ENCOUNTER — Other Ambulatory Visit: Payer: Self-pay

## 2020-02-19 ENCOUNTER — Other Ambulatory Visit: Payer: BC Managed Care – PPO

## 2020-02-19 DIAGNOSIS — E663 Overweight: Secondary | ICD-10-CM | POA: Diagnosis not present

## 2020-02-19 DIAGNOSIS — E1165 Type 2 diabetes mellitus with hyperglycemia: Secondary | ICD-10-CM | POA: Diagnosis not present

## 2020-02-19 DIAGNOSIS — E1159 Type 2 diabetes mellitus with other circulatory complications: Secondary | ICD-10-CM | POA: Diagnosis not present

## 2020-02-19 DIAGNOSIS — K5900 Constipation, unspecified: Secondary | ICD-10-CM

## 2020-02-19 DIAGNOSIS — K2100 Gastro-esophageal reflux disease with esophagitis, without bleeding: Secondary | ICD-10-CM

## 2020-02-19 DIAGNOSIS — E785 Hyperlipidemia, unspecified: Secondary | ICD-10-CM

## 2020-02-19 DIAGNOSIS — E1142 Type 2 diabetes mellitus with diabetic polyneuropathy: Secondary | ICD-10-CM

## 2020-02-19 DIAGNOSIS — I10 Essential (primary) hypertension: Secondary | ICD-10-CM

## 2020-02-19 DIAGNOSIS — Z8673 Personal history of transient ischemic attack (TIA), and cerebral infarction without residual deficits: Secondary | ICD-10-CM | POA: Diagnosis not present

## 2020-02-19 DIAGNOSIS — E1169 Type 2 diabetes mellitus with other specified complication: Secondary | ICD-10-CM

## 2020-02-19 DIAGNOSIS — E782 Mixed hyperlipidemia: Secondary | ICD-10-CM

## 2020-02-19 DIAGNOSIS — IMO0002 Reserved for concepts with insufficient information to code with codable children: Secondary | ICD-10-CM

## 2020-02-19 LAB — CMP14+EGFR
ALT: 26 IU/L (ref 0–32)
AST: 25 IU/L (ref 0–40)
Albumin/Globulin Ratio: 1.5 (ref 1.2–2.2)
Albumin: 4.3 g/dL (ref 3.8–4.8)
Alkaline Phosphatase: 110 IU/L (ref 39–117)
BUN/Creatinine Ratio: 16 (ref 12–28)
BUN: 15 mg/dL (ref 8–27)
Bilirubin Total: 0.4 mg/dL (ref 0.0–1.2)
CO2: 21 mmol/L (ref 20–29)
Calcium: 10 mg/dL (ref 8.7–10.3)
Chloride: 100 mmol/L (ref 96–106)
Creatinine, Ser: 0.94 mg/dL (ref 0.57–1.00)
GFR calc Af Amer: 72 mL/min/{1.73_m2} (ref >59)
GFR calc non Af Amer: 62 mL/min/{1.73_m2} (ref >59)
Globulin, Total: 2.8 g/dL (ref 1.5–4.5)
Glucose: 203 mg/dL — ABNORMAL HIGH (ref 65–99)
Potassium: 4.8 mmol/L (ref 3.5–5.2)
Sodium: 139 mmol/L (ref 134–144)
Total Protein: 7.1 g/dL (ref 6.0–8.5)

## 2020-02-19 LAB — CBC WITH DIFFERENTIAL/PLATELET
Basophils Absolute: 0 10*3/uL (ref 0.0–0.2)
Basos: 1 %
EOS (ABSOLUTE): 0.1 10*3/uL (ref 0.0–0.4)
Eos: 2 %
Hematocrit: 40.1 % (ref 34.0–46.6)
Hemoglobin: 13.5 g/dL (ref 11.1–15.9)
Immature Grans (Abs): 0 10*3/uL (ref 0.0–0.1)
Immature Granulocytes: 0 %
Lymphocytes Absolute: 1.9 10*3/uL (ref 0.7–3.1)
Lymphs: 34 %
MCH: 29 pg (ref 26.6–33.0)
MCHC: 33.7 g/dL (ref 31.5–35.7)
MCV: 86 fL (ref 79–97)
Monocytes Absolute: 0.5 10*3/uL (ref 0.1–0.9)
Monocytes: 9 %
Neutrophils Absolute: 3 10*3/uL (ref 1.4–7.0)
Neutrophils: 54 %
Platelets: 314 10*3/uL (ref 150–450)
RBC: 4.65 x10E6/uL (ref 3.77–5.28)
RDW: 13.3 % (ref 11.7–15.4)
WBC: 5.5 10*3/uL (ref 3.4–10.8)

## 2020-02-19 LAB — BAYER DCA HB A1C WAIVED: HB A1C (BAYER DCA - WAIVED): 10.3 % — ABNORMAL HIGH (ref <7.0)

## 2020-02-21 ENCOUNTER — Other Ambulatory Visit: Payer: Self-pay | Admitting: Family

## 2020-02-21 DIAGNOSIS — E1165 Type 2 diabetes mellitus with hyperglycemia: Secondary | ICD-10-CM

## 2020-02-21 DIAGNOSIS — IMO0002 Reserved for concepts with insufficient information to code with codable children: Secondary | ICD-10-CM

## 2020-02-21 MED ORDER — TRESIBA FLEXTOUCH 100 UNIT/ML ~~LOC~~ SOPN: 45.0000 [IU] | PEN_INJECTOR | Freq: Every day | SUBCUTANEOUS | 2 refills | Status: DC

## 2020-03-11 ENCOUNTER — Telehealth: Payer: Self-pay | Admitting: Family

## 2020-03-11 NOTE — Chronic Care Management (AMB) (Signed)
  Chronic Care Management   Note  03/11/2020 Name: Renee [name] MRN: [id number] DOB: 1951-11-29  Renee [name] is a 70 y.o. year old female who is a primary care patient of Sharion Balloon, FNP. I reached out to [name] by phone today in response to a referral sent by [name] health plan.     [name] was given information about Chronic Care Management services today including:  1. CCM service includes personalized support from designated clinical staff supervised by her physician, including individualized plan of care and coordination with other care providers 2. 24/7 contact phone numbers for assistance for urgent and routine care needs. 3. Service will only be billed when office clinical staff spend 20 minutes or more in a month to coordinate care. 4. Only one practitioner may furnish and bill the service in a calendar month. 5. The patient may stop CCM services at any time (effective at the end of the month) by phone call to the office staff. 6. The patient will be responsible for cost sharing (co-pay) of up to 20% of the service fee (after annual deductible is met).  Patient did not agree to enrollment in care management services and does not wish to consider at this time.  Follow up plan: The patient has been provided with contact information for the care management team and has been advised to call with any health related questions or concerns.   Noreene Larsson, [address], [place], [place] [address] Direct Dial: [phone] [email or web address] Website: [email or web address]

## 2020-03-27 ENCOUNTER — Encounter: Payer: Self-pay | Admitting: Family

## 2020-03-27 ENCOUNTER — Ambulatory Visit (INDEPENDENT_AMBULATORY_CARE_PROVIDER_SITE_OTHER): Payer: BC Managed Care – PPO | Admitting: Family

## 2020-03-27 DIAGNOSIS — E1165 Type 2 diabetes mellitus with hyperglycemia: Secondary | ICD-10-CM | POA: Diagnosis not present

## 2020-03-27 DIAGNOSIS — I10 Essential (primary) hypertension: Secondary | ICD-10-CM

## 2020-03-27 DIAGNOSIS — E1142 Type 2 diabetes mellitus with diabetic polyneuropathy: Secondary | ICD-10-CM

## 2020-03-27 DIAGNOSIS — K2100 Gastro-esophageal reflux disease with esophagitis, without bleeding: Secondary | ICD-10-CM | POA: Diagnosis not present

## 2020-03-27 DIAGNOSIS — IMO0002 Reserved for concepts with insufficient information to code with codable children: Secondary | ICD-10-CM

## 2020-03-27 LAB — CMP14+EGFR
ALT: 22 IU/L (ref 0–32)
AST: 22 IU/L (ref 0–40)
Albumin/Globulin Ratio: 1.6 (ref 1.2–2.2)
Albumin: 4.4 g/dL (ref 3.8–4.8)
Alkaline Phosphatase: 107 IU/L (ref 39–117)
BUN/Creatinine Ratio: 16 (ref 12–28)
BUN: 15 mg/dL (ref 8–27)
Bilirubin Total: 0.2 mg/dL (ref 0.0–1.2)
CO2: 21 mmol/L (ref 20–29)
Calcium: 9.7 mg/dL (ref 8.7–10.3)
Chloride: 102 mmol/L (ref 96–106)
Creatinine, Ser: 0.92 mg/dL (ref 0.57–1.00)
GFR calc Af Amer: 73 mL/min/{1.73_m2} (ref >59)
GFR calc non Af Amer: 63 mL/min/{1.73_m2} (ref >59)
Globulin, Total: 2.8 g/dL (ref 1.5–4.5)
Glucose: 148 mg/dL — ABNORMAL HIGH (ref 65–99)
Potassium: 4.5 mmol/L (ref 3.5–5.2)
Sodium: 139 mmol/L (ref 134–144)
Total Protein: 7.2 g/dL (ref 6.0–8.5)

## 2020-03-27 LAB — BAYER DCA HB A1C WAIVED: HB A1C (BAYER DCA - WAIVED): 10.4 % — ABNORMAL HIGH (ref <7.0)

## 2020-03-27 NOTE — Progress Notes (Signed)
Virtual Visit via telephone Note Due to COVID-19 pandemic this visit was conducted virtually. This visit type was conducted due to national recommendations for restrictions regarding the COVID-19 Pandemic (e.g. social distancing, sheltering in place) in an effort to limit this patient's exposure and mitigate transmission in our community. All issues noted in this document were discussed and addressed.  A physical exam was not performed with this format.  I connected with Renee [name] on 03/27/20 at 9:31 AM by telephone and verified that I am speaking with the correct person using two identifiers. Renee [name] is currently located at home and no one is currently with her during visit. The provider, Evelina Dun, FNP is located in their office at time of visit.  I discussed the limitations, risks, security and privacy concerns of performing an evaluation and management service by telephone and the availability of in person appointments. I also discussed with the patient that there may be a patient responsible charge related to this service. The patient expressed understanding and agreed to proceed.     History and Present Illness:  Pt calls the office today for follow up. Her DM is uncontrolled and is followed by Podiatry every 8 weeks.  Diabetes She presents for her follow-up diabetic visit. She has type 2 diabetes mellitus. Her disease course has been stable. There are no hypoglycemic associated symptoms. Associated symptoms include foot paresthesias. Pertinent negatives for diabetes include no blurred vision. Symptoms are stable. Diabetic complications include peripheral neuropathy. Pertinent negatives for diabetic complications include no CVA or heart disease. Risk factors for coronary artery disease include dyslipidemia, diabetes mellitus, hypertension, sedentary lifestyle and post-menopausal. She is following a generally unhealthy diet. Her overall blood glucose range is >200 mg/dl. Eye  exam is not current.  Hypertension This is a chronic problem. The current episode started more than 1 year ago. The problem has been resolved since onset. The problem is controlled. Associated symptoms include malaise/fatigue. Pertinent negatives include no blurred vision, peripheral edema or shortness of breath. Risk factors for coronary artery disease include dyslipidemia, obesity and sedentary lifestyle. The current treatment provides moderate improvement. There is no history of CVA.  Gastroesophageal Reflux She complains of belching and heartburn. This is a chronic problem. The current episode started more than 1 year ago. The problem occurs occasionally. The problem has been waxing and waning. The symptoms are aggravated by certain foods. Risk factors include obesity. She has tried a PPI for the symptoms. The treatment provided moderate relief.  Diabetic Neuropathy Pt states if she takes her Gabapentin her pain is a 0.    Review of Systems  Constitutional: Positive for malaise/fatigue.  Eyes: Negative for blurred vision.  Respiratory: Negative for shortness of breath.   Gastrointestinal: Positive for heartburn.     Observations/Objective: No SOB or distress noted   Assessment and Plan: 1. Essential hypertension, benign - CMP14+EGFR; Future  2. Uncontrolled type 2 diabetes mellitus, without long-term current use of insulin (HCC) - CMP14+EGFR; Future - Bayer DCA Hb A1c Waived; Future  3. Gastroesophageal reflux disease with esophagitis, unspecified whether hemorrhage - CMP14+EGFR; Future  4. Diabetic peripheral neuropathy (HCC) - CMP14+EGFR; Future  Labs pending Will adjust medications once we have A1C Encouraged healthy diet and exercise  RTO in 3 months    I discussed the assessment and treatment plan with the patient. The patient was provided an opportunity to ask questions and all were answered. The patient agreed with the plan and demonstrated an understanding of the  instructions.   The patient was advised to call back or seek an in-person evaluation if the symptoms worsen or if the condition fails to improve as anticipated.  The above assessment and management plan was discussed with the patient. The patient verbalized understanding of and has agreed to the management plan. Patient is aware to call the clinic if symptoms persist or worsen. Patient is aware when to return to the clinic for a follow-up visit. Patient educated on when it is appropriate to go to the emergency department.   Time call ended: 9:52 Am   I provided  21 minutes of non-face-to-face time during this encounter.    Evelina Dun, FNP

## 2020-03-27 NOTE — Addendum Note (Signed)
Addended by: Earlene Plater on: 03/27/2020 10:49 AM   Modules accepted: Orders

## 2020-04-04 ENCOUNTER — Other Ambulatory Visit: Payer: Self-pay | Admitting: Family

## 2020-04-04 DIAGNOSIS — E1165 Type 2 diabetes mellitus with hyperglycemia: Secondary | ICD-10-CM

## 2020-04-04 DIAGNOSIS — IMO0002 Reserved for concepts with insufficient information to code with codable children: Secondary | ICD-10-CM

## 2020-04-17 DIAGNOSIS — E1151 Type 2 diabetes mellitus with diabetic peripheral angiopathy without gangrene: Secondary | ICD-10-CM | POA: Diagnosis not present

## 2020-04-17 DIAGNOSIS — B351 Tinea unguium: Secondary | ICD-10-CM | POA: Diagnosis not present

## 2020-04-17 DIAGNOSIS — L84 Corns and callosities: Secondary | ICD-10-CM | POA: Diagnosis not present

## 2020-04-17 DIAGNOSIS — M79676 Pain in unspecified toe(s): Secondary | ICD-10-CM | POA: Diagnosis not present

## 2020-04-25 ENCOUNTER — Encounter: Payer: Self-pay | Admitting: "Endocrinology

## 2020-04-25 ENCOUNTER — Ambulatory Visit (INDEPENDENT_AMBULATORY_CARE_PROVIDER_SITE_OTHER): Payer: BC Managed Care – PPO | Admitting: "Endocrinology

## 2020-04-25 ENCOUNTER — Other Ambulatory Visit: Payer: Self-pay

## 2020-04-25 VITALS — BP 152/78 | HR 72 | Ht 66.0 in | Wt 179.8 lb

## 2020-04-25 DIAGNOSIS — E782 Mixed hyperlipidemia: Secondary | ICD-10-CM

## 2020-04-25 DIAGNOSIS — I10 Essential (primary) hypertension: Secondary | ICD-10-CM

## 2020-04-25 DIAGNOSIS — E1165 Type 2 diabetes mellitus with hyperglycemia: Secondary | ICD-10-CM

## 2020-04-25 DIAGNOSIS — IMO0002 Reserved for concepts with insufficient information to code with codable children: Secondary | ICD-10-CM

## 2020-04-25 MED ORDER — TRESIBA FLEXTOUCH 100 UNIT/ML ~~LOC~~ SOPN: 60.0000 [IU] | PEN_INJECTOR | Freq: Every day | SUBCUTANEOUS | 2 refills | Status: DC

## 2020-04-25 NOTE — Patient Instructions (Signed)

## 2020-04-25 NOTE — Progress Notes (Signed)
04/25/2020, 11:03 AM   Endocrinology follow-up note   Subjective:    Patient ID: NYONNA [name], female    DOB: [birth year].  Renee [name] is being seen in  follow-up for the management of her currently uncontrolled type 2 diabetes, hyperlipidemia, hypertension.  PCP:   Sharion Balloon, FNP.   Past Medical History:  Diagnosis Date  . Arthritis   . Breast cancer ([place]) 2001   right mastectomy  . CVA (cerebral infarction) 2003, 2005  . DM (diabetes mellitus) ([place])   . GERD (gastroesophageal reflux disease)   . Heart murmur   . Hyperlipidemia   . Hypertension   . Seizures ([place])   . Vitamin D deficiency    Past Surgical History:  Procedure Laterality Date  . ABDOMINAL HYSTERECTOMY  2000  . GANGLION CYST EXCISION  2010   right (2010)  and left (before 2010) hand  . MASTECTOMY  2001   right  . TONSILECTOMY/ADENOIDECTOMY WITH MYRINGOTOMY Bilateral    age 69's   Social History   Socioeconomic History  . Marital status: Single    Spouse name: Not on file  . Number of children: Not on file  . Years of education: 70  . Highest education level: Associate degree: academic program  Occupational History  . Occupation: Retired    Fish farm manager: [employer]    Comment: [employer]   Tobacco Use  . Smoking status: Never Smoker  . Smokeless tobacco: Never Used  Substance and Sexual Activity  . Alcohol use: Not Currently    Alcohol/week: 1.0 standard drinks    Types: 1 Glasses of wine per week  . Drug use: No  . Sexual activity: Yes    Birth control/protection: Post-menopausal  Other Topics Concern  . Not on file  Social History Narrative  . Not on file   Social Determinants of Health   Financial Resource Strain: Low Risk   . Difficulty of Paying Living Expenses: Not hard at all  Food Insecurity: No Food Insecurity  . Worried About Charity fundraiser in the  Last Year: Never true  . Ran Out of Food in the Last Year: Never true  Transportation Needs: No Transportation Needs  . Lack of Transportation (Medical): No  . Lack of Transportation (Non-Medical): No  Physical Activity: Sufficiently Active  . Days of Exercise per Week: 3 days  . Minutes of Exercise per Session: 60 min  Stress: No Stress Concern Present  . Feeling of Stress : Only a little  Social Connections: Slightly Isolated  . Frequency of Communication with Friends and Family: More than three times a week  . Frequency of Social Gatherings with Friends and Family: More than three times a week  . Attends Religious Services: More than 4 times per year  . Active Member of Clubs or Organizations: Yes  . Attends Archivist Meetings: More than 4 times per year  . Marital Status: Never married   Outpatient Encounter Medications as of 04/25/2020  Medication Sig  . aspirin 81 MG tablet Take 81 mg by mouth daily.  Marland Kitchen atorvastatin (LIPITOR) 40 MG tablet Take 1 tablet (40 mg total) by  mouth daily.  . bisoprolol-hydrochlorothiazide (ZIAC) 2.5-6.25 MG tablet Take 1 tablet by mouth daily. (Needs to be seen before next refill)  . Blood Glucose Calibration (ONETOUCH VERIO) SOLN Use as needed to calibrate glucometer  . Blood Glucose Monitoring Suppl ([place]) w/Device KIT Use to check blood sugar twice daily  . docusate sodium (COLACE) 100 MG capsule Take 100 mg by mouth daily.  Marland Kitchen gabapentin (NEURONTIN) 400 MG capsule Take 1 capsule (400 mg total) by mouth 3 (three) times daily.  Marland Kitchen glipiZIDE (GLUCOTROL XL) 5 MG 24 hr tablet Take 1 tablet (5 mg total) by mouth daily with breakfast.  . glucose blood (ONETOUCH VERIO) test strip Test glucose twice daily Dx E11.65  . insulin degludec (TRESIBA FLEXTOUCH) 100 UNIT/ML FlexTouch Pen Inject 0.6 mLs (60 Units total) into the skin at bedtime.  . Multiple Vitamin (MULTIVITAMIN WITH MINERALS) TABS tablet Take 1 tablet by mouth daily.  Marland Kitchen  omeprazole (PRILOSEC) 20 MG capsule Take 1 capsule (20 mg total) by mouth daily.  [name] DELICA LANCETS 84Y MISC Use to check BG up to bid.  Dx: E11.65 type 2 DM with insulin therapy, uncontrolled  . sitaGLIPtin (JANUVIA) 100 MG tablet Take 100 mg by mouth daily.  . Vitamin D, Ergocalciferol, (DRISDOL) 1.25 MG (50000 UT) CAPS capsule Take 1 capsule (50,000 Units total) by mouth every 7 (seven) days.  . [DISCONTINUED] insulin degludec (TRESIBA FLEXTOUCH) 100 UNIT/ML SOPN FlexTouch Pen Inject 0.45 mLs (45 Units total) into the skin at bedtime.   No facility-administered encounter medications on file as of 04/25/2020.    ALLERGIES: Allergies  Allergen Reactions  . Metformin And Related Other (See Comments)    seizures  . Penicillins     VACCINATION STATUS:  There is no immunization history on file for this patient.  Diabetes She presents for her follow-up diabetic visit. She has type 2 diabetes mellitus. Onset time: Patient says she was diagnosed last year, however her A1c has been high enough for diabetes at least since 2015.  Have A1c is as high as greater than 14%. Her disease course has been worsening. There are no hypoglycemic associated symptoms. Pertinent negatives for hypoglycemia include no confusion, headaches, pallor or seizures. Associated symptoms include polydipsia and polyuria. Pertinent negatives for diabetes include no chest pain, no fatigue and no polyphagia. There are no hypoglycemic complications. Symptoms are worsening. Diabetic complications include peripheral neuropathy. Risk factors for coronary artery disease include dyslipidemia, diabetes mellitus, family history, hypertension, sedentary lifestyle and post-menopausal. Current diabetic treatments: She is currently on Tresiba 40 units nightly, glipizide 5 mg daily, Januvia 100 mg p.o. once a day.    She does not tolerate metformin. She is compliant with treatment most of the time. Her weight is fluctuating minimally. She  is following a generally unhealthy diet. When asked about meal planning, she reported none. She has not had a previous visit with a dietitian. She never participates in exercise. Her home blood glucose trend is increasing steadily. Her breakfast blood glucose range is generally >200 mg/dl. Her bedtime blood glucose range is generally >200 mg/dl. Her overall blood glucose range is >200 mg/dl. (She presents with significant above target glycemic profile averaging greater than 200 mg per DL both fasting and postprandial.) An ACE inhibitor/angiotensin II receptor blocker is not being taken.  Hyperlipidemia This is a chronic problem. The current episode started more than 1 year ago. The problem is controlled. Exacerbating diseases include diabetes. Pertinent negatives include no chest pain, myalgias or  shortness of breath. Current antihyperlipidemic treatment includes statins. Risk factors for coronary artery disease include diabetes mellitus, dyslipidemia, hypertension, a sedentary lifestyle and post-menopausal.  Hypertension This is a chronic problem. The current episode started more than 1 year ago. The problem is controlled. Pertinent negatives include no chest pain, headaches, palpitations or shortness of breath. Risk factors for coronary artery disease include dyslipidemia, diabetes mellitus, sedentary lifestyle, family history and post-menopausal state. Past treatments include beta blockers.    Review of systems  Constitutional: + Minimally fluctuating body weight,  current  Body mass index is 29.02 kg/m. , no fatigue, no subjective hyperthermia, no subjective hypothermia Eyes: no blurry vision, no xerophthalmia ENT: no sore throat, no nodules palpated in throat, no dysphagia/odynophagia, no hoarseness Cardiovascular: no Chest Pain, no Shortness of Breath, no palpitations, no leg swelling Respiratory: no cough, no shortness of breath Gastrointestinal: no Nausea/Vomiting/Diarhhea Musculoskeletal: no  muscle/joint aches Skin: no rashes, no hyperemia Neurological: no tremors, no numbness, no tingling, no dizziness Psychiatric: no depression, no anxiety    Objective:    BP (!) 152/78   Pulse 72   Ht '5\' 6"'  (1.676 m)   Wt 179 lb 12.8 oz (81.6 kg)   BMI 29.02 kg/m   Wt Readings from Last 3 Encounters:  04/25/20 179 lb 12.8 oz (81.6 kg)  01/25/20 176 lb 9.6 oz (80.1 kg)  11/05/19 177 lb 6.4 oz (80.5 kg)      Physical Exam- Limited  Constitutional:  Body mass index is 29.02 kg/m. , not in acute distress, normal state of mind Eyes:  EOMI, no exophthalmos Neck: Supple Thyroid: No gross goiter Respiratory: Adequate breathing efforts Musculoskeletal: no gross deformities, strength intact in all four extremities, no gross restriction of joint movements Skin:  no rashes, no hyperemia Neurological: no tremor with outstretched hands,    CMP ( most recent) CMP     Component Value Date/Time   NA 139 03/27/2020 1050   K 4.5 03/27/2020 1050   CL 102 03/27/2020 1050   CO2 21 03/27/2020 1050   GLUCOSE 148 (H) 03/27/2020 1050   GLUCOSE 185 (H) 01/15/2016 0046   BUN 15 03/27/2020 1050   CREATININE 0.92 03/27/2020 1050   CALCIUM 9.7 03/27/2020 1050   PROT 7.2 03/27/2020 1050   ALBUMIN 4.4 03/27/2020 1050   AST 22 03/27/2020 1050   ALT 22 03/27/2020 1050   ALKPHOS 107 03/27/2020 1050   BILITOT 0.2 03/27/2020 1050   GFRNONAA 63 03/27/2020 1050   GFRAA 73 03/27/2020 1050     Diabetic Labs (most recent): Lab Results  Component Value Date   HGBA1C 10.4 (H) 03/27/2020   HGBA1C 10.3 (H) 02/19/2020   HGBA1C 8.1 (A) 01/25/2020     Lipid Panel ( most recent) Lipid Panel     Component Value Date/Time   CHOL 146 11/05/2019 1236   TRIG 87 11/05/2019 1236   HDL 47 11/05/2019 1236   CHOLHDL 3.1 11/05/2019 1236   LDLCALC 82 11/05/2019 1236      Lab Results  Component Value Date   TSH 2.710 07/05/2016       Assessment & Plan:   1. Uncontrolled type 2 diabetes  mellitus, without long-term current use of insulin ([place])  - CAGNEY STEENSON has currently uncontrolled symptomatic type 2 DM since approximately age of 69 years of age. -She presents with loss of control of glycemia averaging greater than 200 mg per DL and recent A1c of 10.4% increasing from 8.2%.   She has no documented  or reported hypoglycemia.    -Recent labs are reviewed with her.     -her diabetes is complicated by peripheral neuropathy and she remains at a high risk for more acute and chronic complications which include CAD, CVA, CKD, retinopathy, and neuropathy. These are all discussed in detail with her.  - I have counseled her on diet management by adopting a carbohydrate restricted/protein rich diet.  - she  admits there is a room for improvement in her diet and drink choices. -  Suggestion is made for her to avoid simple carbohydrates  from her diet including Cakes, Sweet Desserts / Pastries, Ice Cream, Soda (diet and regular), Sweet Tea, Candies, Chips, Cookies, Sweet Pastries,  Store Bought Juices, Alcohol in Excess of  1-2 drinks a day, Artificial Sweeteners, Coffee Creamer, and "Sugar-free" Products. This will help patient to have stable blood glucose profile and potentially avoid unintended weight gain.  - I encouraged her to switch to  unprocessed or minimally processed complex starch and increased protein intake (animal or plant source), fruits, and vegetables.  - she is advised to stick to a routine mealtimes to eat 3 meals  a day and avoid unnecessary snacks ( to snack only to correct hypoglycemia).   - she has been scheduled  with Jearld Fenton, RDN, CDE for individualized diabetes education.  - I have approached her with the following individualized plan to manage diabetes and patient agrees:   -She did have dietary indiscretions since last visit.  She presents with loss of control of glycemia with A1c of 10.4%.  -This patient gets overwhelmed with complex treatment  regiment.  She will be advised to maximize her basal insulin before considering prandial insulin.   -She is advised to increase her Tresiba to 60 units nightly, continue monitoring blood glucose at least twice daily-daily before breakfast and at bedtime and return in 4 weeks for reevaluation.   - she is encouraged to call clinic for blood glucose levels less than 70 or above 200 mg /dl. -She reports significant intolerance to metformin and does not wish to resume. -She is advised to continue Januvia 100 mg p.o. daily at breakfast, and glipizide 5 mg XL daily at breakfast.   -She is not a candidate for SGLT2 inhibitors nor GLP-1 receptor agonist.   - Patient specific target  A1c;  LDL, HDL, Triglycerides,  were discussed in detail.  2) Blood Pressure /Hypertension: Her blood pressure is not controlled to target.  She is advised to continue  her current medications including bisoprolol-hydrochlorothiazide 12.5-6.25 p.o. twice daily.   3) Lipids/Hyperlipidemia:   Review of her recent lipid panel showed  controlled  LDL at 67 .  she  is advised to continue simvastatin 40 mg p.o. daily at bedtime.      Side effects and precautions discussed with her.  4)  Weight/Diet: Her BMI is 29-she would benefit from some weight loss.    I discussed with her the fact that loss of 5 - 10% of her  current body weight will have the most impact on her diabetes management.  CDE Consult will be initiated . Exercise, and detailed carbohydrates information provided  -  detailed on discharge instructions.  5) Chronic Care/Health Maintenance:  -she  is on statin medications and  is encouraged to initiate and continue to follow up with Ophthalmology, Dentist,  Podiatrist at least yearly or according to recommendations, and advised to  stay away from smoking. I have recommended yearly flu vaccine and pneumonia vaccine at  least every 5 years; moderate intensity exercise for up to 150 minutes weekly; and  sleep for at least 7  hours a day.  - she is  advised to maintain close follow up with Sharion Balloon, FNP for primary care needs, as well as her other providers for optimal and coordinated care.  - Time spent on this patient care encounter:  35 min, of which > 50% was spent in  counseling and the rest reviewing her blood glucose logs , discussing her hypoglycemia and hyperglycemia episodes, reviewing her current and  previous labs / studies  ( including abstraction from other facilities) and medications  doses and developing a  long term treatment plan and documenting her care.   Please refer to Patient Instructions for Blood Glucose Monitoring and Insulin/Medications Dosing Guide"  in media tab for additional information. Please  also refer to " Patient Self Inventory" in the Media  tab for reviewed elements of pertinent patient history.  [name] participated in the discussions, expressed understanding, and voiced agreement with the above plans.  All questions were answered to her satisfaction. she is encouraged to contact clinic should she have any questions or concerns prior to her return visit.   Follow up plan: - Return in about 4 weeks (around 05/23/2020) for Follow up with Meter and Logs Only - no Labs.  Glade Lloyd, MD [facility] [address] [place], [place] [address] Phone: [phone]  Fax: [phone]    04/25/2020, 11:03 AM  This note was partially dictated with voice recognition software. Similar sounding words can be transcribed inadequately or may not  be corrected upon review.

## 2020-05-06 ENCOUNTER — Other Ambulatory Visit: Payer: Self-pay | Admitting: Family

## 2020-05-27 ENCOUNTER — Encounter: Payer: Self-pay | Admitting: "Endocrinology

## 2020-05-27 ENCOUNTER — Ambulatory Visit (INDEPENDENT_AMBULATORY_CARE_PROVIDER_SITE_OTHER): Payer: BC Managed Care – PPO | Admitting: "Endocrinology

## 2020-05-27 ENCOUNTER — Other Ambulatory Visit: Payer: Self-pay

## 2020-05-27 VITALS — BP 153/80 | HR 70 | Ht 66.0 in | Wt 183.0 lb

## 2020-05-27 DIAGNOSIS — E1165 Type 2 diabetes mellitus with hyperglycemia: Secondary | ICD-10-CM | POA: Diagnosis not present

## 2020-05-27 DIAGNOSIS — E782 Mixed hyperlipidemia: Secondary | ICD-10-CM | POA: Diagnosis not present

## 2020-05-27 DIAGNOSIS — I10 Essential (primary) hypertension: Secondary | ICD-10-CM | POA: Diagnosis not present

## 2020-05-27 DIAGNOSIS — IMO0002 Reserved for concepts with insufficient information to code with codable children: Secondary | ICD-10-CM

## 2020-05-27 MED ORDER — TRESIBA FLEXTOUCH 200 UNIT/ML ~~LOC~~ SOPN: 70.0000 [IU] | PEN_INJECTOR | Freq: Every day | SUBCUTANEOUS | 2 refills | Status: DC

## 2020-05-27 NOTE — Patient Instructions (Signed)

## 2020-05-27 NOTE — Progress Notes (Signed)
05/27/2020, 11:10 AM   Endocrinology follow-up note   Subjective:    Patient ID: Renee [name], female    DOB: 05-Jan-1951.  Renee [name] is being seen in  follow-up for the management of her currently uncontrolled type 2 diabetes, hyperlipidemia, hypertension.  PCP:   Sharion Balloon, FNP.   Past Medical History:  Diagnosis Date  . Arthritis   . Breast cancer ([place]) 2001   right mastectomy  . CVA (cerebral infarction) 2003, 2005  . DM (diabetes mellitus) ([place])   . GERD (gastroesophageal reflux disease)   . Heart murmur   . Hyperlipidemia   . Hypertension   . Seizures ([place])   . Vitamin D deficiency    Past Surgical History:  Procedure Laterality Date  . ABDOMINAL HYSTERECTOMY  2000  . GANGLION CYST EXCISION  2010   right (2010)  and left (before 2010) hand  . MASTECTOMY  2001   right  . TONSILECTOMY/ADENOIDECTOMY WITH MYRINGOTOMY Bilateral    age 45's   Social History   Socioeconomic History  . Marital status: Single    Spouse name: Not on file  . Number of children: Not on file  . Years of education: 36  . Highest education level: Associate degree: academic program  Occupational History  . Occupation: Retired    Fish farm manager: [employer]    Comment: [employer] [facility] [employer]   Tobacco Use  . Smoking status: Never Smoker  . Smokeless tobacco: Never Used  Substance and Sexual Activity  . Alcohol use: Not Currently    Alcohol/week: 1.0 standard drinks    Types: 1 Glasses of wine per week  . Drug use: No  . Sexual activity: Yes    Birth control/protection: Post-menopausal  Other Topics Concern  . Not on file  Social History Narrative  . Not on file   Social Determinants of Health   Financial Resource Strain: Low Risk   . Difficulty of Paying Living Expenses: Not hard at all  Food Insecurity: No Food Insecurity  . Worried About Charity fundraiser in the  Last Year: Never true  . Ran Out of Food in the Last Year: Never true  Transportation Needs: No Transportation Needs  . Lack of Transportation (Medical): No  . Lack of Transportation (Non-Medical): No  Physical Activity: Sufficiently Active  . Days of Exercise per Week: 3 days  . Minutes of Exercise per Session: 60 min  Stress: No Stress Concern Present  . Feeling of Stress : Only a little  Social Connections: Slightly Isolated  . Frequency of Communication with Friends and Family: More than three times a week  . Frequency of Social Gatherings with Friends and Family: More than three times a week  . Attends Religious Services: More than 4 times per year  . Active Member of Clubs or Organizations: Yes  . Attends Archivist Meetings: More than 4 times per year  . Marital Status: Never married   Outpatient Encounter Medications as of 05/27/2020  Medication Sig  . aspirin 81 MG tablet Take 81 mg by mouth daily.  Marland Kitchen atorvastatin (LIPITOR) 40 MG tablet Take 1 tablet by mouth once daily  .  bisoprolol-hydrochlorothiazide (ZIAC) 2.5-6.25 MG tablet Take 1 tablet by mouth daily. (Needs to be seen before next refill)  . Blood Glucose Calibration (ONETOUCH VERIO) SOLN Use as needed to calibrate glucometer  . Blood Glucose Monitoring Suppl ([place]) w/Device KIT Use to check blood sugar twice daily  . docusate sodium (COLACE) 100 MG capsule Take 100 mg by mouth daily.  Marland Kitchen gabapentin (NEURONTIN) 400 MG capsule Take 1 capsule (400 mg total) by mouth 3 (three) times daily.  Marland Kitchen glipiZIDE (GLUCOTROL XL) 5 MG 24 hr tablet Take 1 tablet (5 mg total) by mouth daily with breakfast.  . glucose blood (ONETOUCH VERIO) test strip Test glucose twice daily Dx E11.65  . insulin degludec (TRESIBA FLEXTOUCH) 200 UNIT/ML FlexTouch Pen Inject 70 Units into the skin at bedtime.  . Multiple Vitamin (MULTIVITAMIN WITH MINERALS) TABS tablet Take 1 tablet by mouth daily.  Marland Kitchen omeprazole (PRILOSEC) 20  MG capsule Take 1 capsule (20 mg total) by mouth daily.  [name] DELICA LANCETS 84X MISC Use to check BG up to bid.  Dx: E11.65 type 2 DM with insulin therapy, uncontrolled  . sitaGLIPtin (JANUVIA) 100 MG tablet Take 100 mg by mouth daily.  . Vitamin D, Ergocalciferol, (DRISDOL) 1.25 MG (50000 UT) CAPS capsule Take 1 capsule (50,000 Units total) by mouth every 7 (seven) days.  . [DISCONTINUED] insulin degludec (TRESIBA FLEXTOUCH) 100 UNIT/ML FlexTouch Pen Inject 0.6 mLs (60 Units total) into the skin at bedtime.   No facility-administered encounter medications on file as of 05/27/2020.    ALLERGIES: Allergies  Allergen Reactions  . Metformin And Related Other (See Comments)    seizures  . Penicillins     VACCINATION STATUS:  There is no immunization history on file for this patient.  Diabetes She presents for her follow-up diabetic visit. She has type 2 diabetes mellitus. Onset time: Patient says she was diagnosed last year, however her A1c has been high enough for diabetes at least since 2015.  Have A1c is as high as greater than 14%. Her disease course has been worsening. There are no hypoglycemic associated symptoms. Pertinent negatives for hypoglycemia include no confusion, headaches, pallor or seizures. Associated symptoms include polydipsia and polyuria. Pertinent negatives for diabetes include no chest pain, no fatigue and no polyphagia. There are no hypoglycemic complications. Symptoms are worsening. Diabetic complications include peripheral neuropathy. Risk factors for coronary artery disease include dyslipidemia, diabetes mellitus, family history, hypertension, sedentary lifestyle and post-menopausal. Current diabetic treatments: She is currently on Tresiba 40 units nightly, glipizide 5 mg daily, Januvia 100 mg p.o. once a day.    She does not tolerate metformin. She is compliant with treatment most of the time. Her weight is increasing steadily. She is following a generally  unhealthy diet. When asked about meal planning, she reported none. She has not had a previous visit with a dietitian. She never participates in exercise. Her home blood glucose trend is increasing steadily. Her breakfast blood glucose range is generally 180-200 mg/dl. Her bedtime blood glucose range is generally >200 mg/dl. Her overall blood glucose range is 180-200 mg/dl. (She presents with significantly above target glycemic profile both fasting and postprandial.  She has no documented or reported hypoglycemia.   ) An ACE inhibitor/angiotensin II receptor blocker is not being taken.  Hyperlipidemia This is a chronic problem. The current episode started more than 1 year ago. The problem is controlled. Exacerbating diseases include diabetes. Pertinent negatives include no chest pain, myalgias or shortness of breath. Current  antihyperlipidemic treatment includes statins. Risk factors for coronary artery disease include diabetes mellitus, dyslipidemia, hypertension, a sedentary lifestyle and post-menopausal.  Hypertension This is a chronic problem. The current episode started more than 1 year ago. The problem is controlled. Pertinent negatives include no chest pain, headaches, palpitations or shortness of breath. Risk factors for coronary artery disease include dyslipidemia, diabetes mellitus, sedentary lifestyle, family history and post-menopausal state. Past treatments include beta blockers.    Review of systems  Constitutional: + Minimally fluctuating body weight,  current  Body mass index is 29.54 kg/m. , no fatigue, no subjective hyperthermia, no subjective hypothermia Eyes: no blurry vision, no xerophthalmia ENT: no sore throat, no nodules palpated in throat, no dysphagia/odynophagia, no hoarseness Cardiovascular: no Chest Pain, no Shortness of Breath, no palpitations, no leg swelling Respiratory: no cough, no shortness of breath Gastrointestinal: no Nausea/Vomiting/Diarhhea Musculoskeletal: no  muscle/joint aches Skin: no rashes, no hyperemia Neurological: no tremors, no numbness, no tingling, no dizziness Psychiatric: no depression, no anxiety    Objective:    BP (!) 153/80   Pulse 70   Ht _0  (1.676 m)   Wt 183 lb (83 kg)   BMI 29.54 kg/m   Wt Readings from Last 3 Encounters:  05/27/20 183 lb (83 kg)  04/25/20 179 lb 12.8 oz (81.6 kg)  01/25/20 176 lb 9.6 oz (80.1 kg)      Physical Exam- Limited  Constitutional:  Body mass index is 29.54 kg/m. , not in acute distress, normal state of mind Eyes:  EOMI, no exophthalmos Neck: Supple Thyroid: No gross goiter Respiratory: Adequate breathing efforts Musculoskeletal: no gross deformities, strength intact in all four extremities, no gross restriction of joint movements Skin:  no rashes, no hyperemia Neurological: no tremor with outstretched hands,    CMP ( most recent) CMP     Component Value Date/Time   NA 139 03/27/2020 1050   K 4.5 03/27/2020 1050   CL 102 03/27/2020 1050   CO2 21 03/27/2020 1050   GLUCOSE 148 (H) 03/27/2020 1050   GLUCOSE 185 (H) 01/15/2016 0046   BUN 15 03/27/2020 1050   CREATININE 0.92 03/27/2020 1050   CALCIUM 9.7 03/27/2020 1050   PROT 7.2 03/27/2020 1050   ALBUMIN 4.4 03/27/2020 1050   AST 22 03/27/2020 1050   ALT 22 03/27/2020 1050   ALKPHOS 107 03/27/2020 1050   BILITOT 0.2 03/27/2020 1050   GFRNONAA 63 03/27/2020 1050   GFRAA 73 03/27/2020 1050     Diabetic Labs (most recent): Lab Results  Component Value Date   HGBA1C 10.4 (H) 03/27/2020   HGBA1C 10.3 (H) 02/19/2020   HGBA1C 8.1 (A) 01/25/2020     Lipid Panel ( most recent) Lipid Panel     Component Value Date/Time   CHOL 146 11/05/2019 1236   TRIG 87 11/05/2019 1236   HDL 47 11/05/2019 1236   CHOLHDL 3.1 11/05/2019 1236   LDLCALC 82 11/05/2019 1236      Lab Results  Component Value Date   TSH 2.710 07/05/2016       Assessment & Plan:   1. Uncontrolled type 2 diabetes mellitus, without  long-term current use of insulin ([place])  - Renee [name] has currently uncontrolled symptomatic type 2 DM since approximately age of 69 years of age. She presents with significantly above target glycemic profile both fasting and postprandial.  She has no documented or reported hypoglycemia.  Her recent A1c was 10.4% increasing from 8.2%.  -Recent labs are reviewed with her.     -  her diabetes is complicated by peripheral neuropathy and she remains at a high risk for more acute and chronic complications which include CAD, CVA, CKD, retinopathy, and neuropathy. These are all discussed in detail with her.  - I have counseled her on diet management by adopting a carbohydrate restricted/protein rich diet.  - she  admits there is a room for improvement in her diet and drink choices. -  Suggestion is made for her to avoid simple carbohydrates  from her diet including Cakes, Sweet Desserts / Pastries, Ice Cream, Soda (diet and regular), Sweet Tea, Candies, Chips, Cookies, Sweet Pastries,  Store Bought Juices, Alcohol in Excess of  1-2 drinks a day, Artificial Sweeteners, Coffee Creamer, and "Sugar-free" Products. This will help patient to have stable blood glucose profile and potentially avoid unintended weight gain.   - I encouraged her to switch to  unprocessed or minimally processed complex starch and increased protein intake (animal or plant source), fruits, and vegetables.  - she is advised to stick to a routine mealtimes to eat 3 meals  a day and avoid unnecessary snacks ( to snack only to correct hypoglycemia).   - she has been scheduled  with Jearld Fenton, RDN, CDE for individualized diabetes education.  - I have approached her with the following individualized plan to manage diabetes and patient agrees:    -This patient gets overwhelmed with complex treatment regiment.  She will be advised to maximize her basal insulin before considering prandial insulin.   -She is advised to increase  her Tresiba to 70 nightly, continue monitoring blood glucose at least twice daily-daily before breakfast and at bedtime and return in 4 weeks for reevaluation.   - she is encouraged to call clinic for blood glucose levels less than 70 or above 200 mg /dl. -She reports significant intolerance to metformin and does not wish to resume. -She is advised to continue Januvia 100 mg p.o. daily at breakfast, and glipizide 5 mg XL daily at breakfast.   -She is not a candidate for SGLT2 inhibitors nor GLP-1 receptor agonist.  - Patient specific target  A1c;  LDL, HDL, Triglycerides,  were discussed in detail.  2) Blood Pressure /Hypertension:  Her blood pressure is not controlled to target.  She is advised to continue  her current medications including bisoprolol-hydrochlorothiazide 12.5-6.25 p.o. twice daily.   3) Lipids/Hyperlipidemia:   Review of her recent lipid panel showed  controlled  LDL at 67 .  she  is advised to continue simvastatin 40 mg p.o. daily at bedtime.       Side effects and precautions discussed with her.  4)  Weight/Diet: Her BMI is 29.5-she would benefit from some weight loss.    I discussed with her the fact that loss of 5 - 10% of her  current body weight will have the most impact on her diabetes management.  CDE Consult will be initiated . Exercise, and detailed carbohydrates information provided  -  detailed on discharge instructions.  5) Chronic Care/Health Maintenance:  -she  is on statin medications and  is encouraged to initiate and continue to follow up with Ophthalmology, Dentist,  Podiatrist at least yearly or according to recommendations, and advised to  stay away from smoking. I have recommended yearly flu vaccine and pneumonia vaccine at least every 5 years; moderate intensity exercise for up to 150 minutes weekly; and  sleep for at least 7 hours a day.  - she is  advised to maintain close follow up with [facility],  Theador Hawthorne, FNP for primary care needs, as well as her other  providers for optimal and coordinated care.  - Time spent on this patient care encounter:  35 min, of which > 50% was spent in  counseling and the rest reviewing her blood glucose logs , discussing her hypoglycemia and hyperglycemia episodes, reviewing her current and  previous labs / studies  ( including abstraction from other facilities) and medications  doses and developing a  long term treatment plan and documenting her care.   Please refer to Patient Instructions for Blood Glucose Monitoring and Insulin/Medications Dosing Guide"  in media tab for additional information. Please  also refer to " Patient Self Inventory" in the Media  tab for reviewed elements of pertinent patient history.  [name] participated in the discussions, expressed understanding, and voiced agreement with the above plans.  All questions were answered to her satisfaction. she is encouraged to contact clinic should she have any questions or concerns prior to her return visit.   Follow up plan: - Return in about 5 weeks (around 07/01/2020) for Bring Meter and Logs- A1c in Office.  Glade Lloyd, MD [facility] [address] [place], [place] [address] Phone: [phone]  Fax: [phone]    05/27/2020, 11:10 AM  This note was partially dictated with voice recognition software. Similar sounding words can be transcribed inadequately or may not  be corrected upon review.

## 2020-07-09 ENCOUNTER — Ambulatory Visit: Payer: BC Managed Care – PPO | Admitting: "Endocrinology

## 2020-07-21 ENCOUNTER — Ambulatory Visit (INDEPENDENT_AMBULATORY_CARE_PROVIDER_SITE_OTHER): Payer: Medicare Other | Admitting: *Deleted

## 2020-07-21 DIAGNOSIS — Z0000 Encounter for general adult medical examination without abnormal findings: Secondary | ICD-10-CM

## 2020-07-21 NOTE — Progress Notes (Signed)
   MEDICARE ANNUAL WELLNESS VISIT  07/21/2020   Provider calling from [facility]. Patient participating in call from home.   Telephone Visit Disclaimer This Medicare AWV was conducted by telephone due to national recommendations for restrictions regarding the COVID-19 Pandemic (e.g. social distancing).  I verified, using two identifiers, that I am speaking with Renee [name] or their authorized healthcare agent. I discussed the limitations, risks, security, and privacy concerns of performing an evaluation and management service by telephone and the potential availability of an in-person appointment in the future. The patient expressed understanding and agreed to proceed.   Subjective:  Renee [name] is a 69 y.o. female patient of Hawks, Christy A, FNP who had a Medicare Annual Wellness Visit today via telephone. Renee [name] is retired and lives alone. She does not have any children. She reports that she is socially active and does interact with friends/family regularly. She is minimally physically active and enjoys reading and word puzzles.  Patient Care Team: Hawks, Christy A, FNP as PCP - General (Nurse Practitioner) Tat, Rebecca S, DO as Consulting Physician (Neurology) Pyrtle, Jay M, MD as Consulting Physician (Gastroenterology) Nida, Gebreselassie W, MD as Consulting Physician (Endocrinology) Drake, Cody, DPM as Consulting Physician (Podiatry)  Advanced Directives 07/21/2020 06/05/2019 10/30/2018 07/14/2017 07/05/2016 01/14/2016  Does Patient Have a Medical Advance Directive? Yes No No Yes No No  Type of Advance Directive Healthcare Power of Attorney - - Healthcare Power of Attorney - -  Does patient want to make changes to medical advance directive? No - Patient declined - - No - Patient declined - -  Copy of Healthcare Power of Attorney in Chart? Yes - validated most recent copy scanned in chart (See row information) - - Yes - -  Would patient like information on creating a medical advance  directive? - Yes (MAU/Ambulatory/Procedural Areas - Information given) No - Patient declined - Yes - Educational materials given No - patient declined information    Hospital Utilization Over the Past 12 Months: # of hospitalizations or ER visits: 0 # of surgeries: 0  Review of Systems    Patient reports that her overall health is better compared to last year.  History obtained from the patient and patient chart.   Patient Reported Readings (BP, Pulse, CBG, Weight, etc) none  Pain Assessment Pain : No/denies pain     Current Medications & Allergies (verified) Allergies as of 07/21/2020      Reactions   Metformin And Related Other (See Comments)   seizures   Penicillins       Medication List       Accurate as of July 21, 2020  9:47 AM. If you have any questions, ask your nurse or doctor.        aspirin 81 MG tablet Take 81 mg by mouth daily.   atorvastatin 40 MG tablet Commonly known as: LIPITOR Take 1 tablet by mouth once daily   bisoprolol-hydrochlorothiazide 2.5-6.25 MG tablet Commonly known as: ZIAC Take 1 tablet by mouth daily. (Needs to be seen before next refill)   docusate sodium 100 MG capsule Commonly known as: COLACE Take 100 mg by mouth daily.   gabapentin 400 MG capsule Commonly known as: NEURONTIN Take 1 capsule (400 mg total) by mouth 3 (three) times daily.   glipiZIDE 5 MG 24 hr tablet Commonly known as: GLUCOTROL XL Take 1 tablet (5 mg total) by mouth daily with breakfast.   multivitamin with minerals Tabs tablet Take 1 tablet by mouth daily.     omeprazole 20 MG capsule Commonly known as: PRILOSEC Take 1 capsule (20 mg total) by mouth daily.   OneTouch Delica Lancets 33G Misc Use to check BG up to bid.  Dx: E11.65 type 2 DM with insulin therapy, uncontrolled   OneTouch Verio Flex System w/Device Kit Use to check blood sugar twice daily   OneTouch Verio Soln Use as needed to calibrate glucometer   OneTouch Verio test  strip Generic drug: glucose blood Test glucose twice daily Dx E11.65   sitaGLIPtin 100 MG tablet Commonly known as: JANUVIA Take 100 mg by mouth daily.   Tresiba FlexTouch 200 UNIT/ML FlexTouch Pen Generic drug: insulin degludec Inject 70 Units into the skin at bedtime.   Vitamin D (Ergocalciferol) 1.25 MG (50000 UNIT) Caps capsule Commonly known as: DRISDOL Take 1 capsule (50,000 Units total) by mouth every 7 (seven) days.       History (reviewed): Past Medical History:  Diagnosis Date  . Arthritis   . Breast cancer (HCC) 2001   right mastectomy  . CVA (cerebral infarction) 2003, 2005  . DM (diabetes mellitus) (HCC)   . GERD (gastroesophageal reflux disease)   . Heart murmur   . Hyperlipidemia   . Hypertension   . Seizures (HCC)   . Vitamin D deficiency    Past Surgical History:  Procedure Laterality Date  . ABDOMINAL HYSTERECTOMY  2000  . GANGLION CYST EXCISION  2010   right (2010)  and left (before 2010) hand  . MASTECTOMY  2001   right  . TONSILECTOMY/ADENOIDECTOMY WITH MYRINGOTOMY Bilateral    age 39's   Family History  Problem Relation Age of Onset  . Alzheimer's disease Mother   . Parkinson's disease Mother   . Heart Problems Father   . Kidney disease Father        +dialysis  . Heart disease Father   . Other Sister        +TAH for fibroids  . Cancer Paternal Aunt        NOS cancer  . Breast cancer Sister        dx. 55-56  . Renal cancer Sister        dx. 62-63; not a smoker  . Early death Brother   . Diabetes Brother   . Colon cancer Neg Hx   . Stomach cancer Neg Hx    Social History   Socioeconomic History  . Marital status: Single    Spouse name: Not on file  . Number of children: Not on file  . Years of education: 12  . Highest education level: Associate degree: academic program  Occupational History  . Occupation: Retired    Employer: [employer]    Comment: [employer]   Tobacco Use  .  Smoking status: Never Smoker  . Smokeless tobacco: Never Used  Vaping Use  . Vaping Use: Never used  Substance and Sexual Activity  . Alcohol use: Not Currently    Alcohol/week: 1.0 standard drink    Types: 1 Glasses of wine per week  . Drug use: No  . Sexual activity: Yes    Birth control/protection: Post-menopausal  Other Topics Concern  . Not on file  Social History Narrative   Retired from school system, single, lives alone, no children. Enjoys reading and word puzzles.    Social Determinants of Health   Financial Resource Strain:   . [name] of Paying Living Expenses:   Food Insecurity:   . Worried About Running Out of Food   in the Last Year:   . [place] in the Last Year:   Transportation Needs:   . Film/video editor (Medical):   Marland Kitchen Lack of Transportation (Non-Medical):   Physical Activity:   . Days of Exercise per Week:   . Minutes of Exercise per Session:   Stress:   . Feeling of Stress :   Social Connections:   . Frequency of Communication with Friends and Family:   . Frequency of Social Gatherings with Friends and Family:   . Attends Religious Services:   . Active Member of Clubs or Organizations:   . Attends Archivist Meetings:   Marland Kitchen Marital Status:     Activities of Daily Living In your present state of health, do you have any [name] performing the following activities: 07/21/2020  Hearing? N  Vision? N  [name] concentrating or making decisions? N  Walking or climbing stairs? N  Dressing or bathing? N  Doing errands, shopping? N  Preparing Food and eating ? N  Using the Toilet? N  In the past six months, have you accidently leaked urine? N  Do you have problems with loss of bowel control? N  Managing your Medications? N  Managing your Finances? N  Housekeeping or managing your Housekeeping? N  Some recent data might be hidden    Patient Education/ Literacy How often do you need to have someone help you when you read  instructions, pamphlets, or other written materials from your doctor or pharmacy?: 1 - Never What is the last grade level you completed in school?: some college  Exercise Current Exercise Habits: The patient does not participate in regular exercise at present, Exercise limited by: None identified  Diet Patient reports consuming 3 meals a day and 0 snack(s) a day Patient reports that her primary diet is: Regular Patient reports that she does have regular access to food.   Depression Screen PHQ 2/9 Scores 07/21/2020 11/05/2019 06/05/2019 05/22/2019 03/23/2019 03/14/2019 11/20/2018  PHQ - 2 Score 0 0 0 0 0 0 0     Fall Risk Fall Risk  07/21/2020 11/05/2019 06/05/2019 05/22/2019 03/23/2019  Falls in the past year? 0 0 0 0 0  Follow up - - Falls prevention discussed - -     Objective:  Renee [name] seemed alert and oriented and she participated appropriately during our telephone visit.  Blood Pressure Weight BMI  BP Readings from Last 3 Encounters:  05/27/20 (!) 153/80  04/25/20 (!) 152/78  02/14/20 133/76   Wt Readings from Last 3 Encounters:  05/27/20 183 lb (83 kg)  04/25/20 179 lb 12.8 oz (81.6 kg)  01/25/20 176 lb 9.6 oz (80.1 kg)   BMI Readings from Last 1 Encounters:  05/27/20 29.54 kg/m    *Unable to obtain current vital signs, weight, and BMI due to telephone visit type  Hearing/Vision  . Renee [name] with hearing/understanding during the telephone conversation . Reports that she has not had a formal eye exam by an eye care professional within the past year . Reports that she has not had a formal hearing evaluation within the past year *Unable to fully assess hearing and vision during telephone visit type  Cognitive Function: 6CIT Screen 07/21/2020 06/05/2019  What Year? 0 points 0 points  What month? 0 points 0 points  What time? 0 points 0 points  Count back from 20 0 points 2 points  Months in reverse 4 points 0 points  Repeat  phrase 2 points 0  points  Total Score 6 2   (Normal:0-7, Significant for Dysfunction: >8)  Normal Cognitive Function Screening: Yes   Immunization & Health Maintenance Record  There is no immunization history on file for this patient.  Health Maintenance  Topic Date Due  . COVID-19 Vaccine (1) Never done  . OPHTHALMOLOGY EXAM  09/22/2019  . MAMMOGRAM  04/25/2020  . PNA vac Low Risk Adult (1 of 2 - PCV13) 11/04/2024 (Originally 03/02/2015)  . HEMOGLOBIN A1C  09/26/2020  . FOOT EXAM  11/04/2020  . COLONOSCOPY  08/17/2022  . TETANUS/TDAP  12/27/2022  . DEXA SCAN  Completed  . Hepatitis C Screening  Completed  . INFLUENZA VACCINE  Discontinued       Assessment  This is a routine wellness examination for Renee [name].  Health Maintenance: Due or Overdue Health Maintenance Due  Topic Date Due  . COVID-19 Vaccine (1) Never done  . OPHTHALMOLOGY EXAM  09/22/2019  . MAMMOGRAM  04/25/2020    Anouk C Landgrebe does not need a referral for Community Assistance: Care Management:   no Social Work:    no Prescription Assistance:  no Nutrition/Diabetes Education:  no   Plan:  Personalized Goals Goals Addressed            This Visit's Progress   . Patient Stated       07/21/2020 AWV Goal: Exercise for General Health   Patient will verbalize understanding of the benefits of increased physical activity:  Exercising regularly is important. It will improve your overall fitness, flexibility, and endurance.  Regular exercise also will improve your overall health. It can help you control your weight, reduce stress, and improve your bone density.  Over the next year, patient will increase physical activity as tolerated with a goal of at least 150 minutes of moderate physical activity per week.   You can tell that you are exercising at a moderate intensity if your heart starts beating faster and you start breathing faster but can still hold a conversation.  Moderate-intensity exercise ideas  include:  Walking 1 mile (1.6 km) in about 15 minutes  Biking  Hiking  Golfing  Dancing  Water aerobics  Patient will verbalize understanding of everyday activities that increase physical activity by providing examples like the following: ? Yard work, such as: ? Pushing a lawn mower ? Raking and bagging leaves ? Washing your car ? Pushing a stroller ? Shoveling snow ? Gardening ? Washing windows or floors  Patient will be able to explain general safety guidelines for exercising:   Before you start a new exercise program, talk with your health care provider.  Do not exercise so much that you hurt yourself, feel dizzy, or get very short of breath.  Wear comfortable clothes and wear shoes with good support.  Drink plenty of water while you exercise to prevent dehydration or heat stroke.  Work out until your breathing and your heartbeat get faster.       Personalized Health Maintenance & Screening Recommendations  Screening mammography  Lung Cancer Screening Recommended: no (Low Dose CT Chest recommended if Age 55-80 years, 30 pack-year currently smoking OR have quit w/in past 15 years) Hepatitis C Screening recommended: no HIV Screening recommended: no  Advanced Directives: Written information was not prepared per patient's request.  Referrals & Orders No orders of the defined types were placed in this encounter.   Follow-up Plan . Follow-up with Hawks, Christy A, FNP as planned . Schedule   mammogram and eye exam  .    I have personally reviewed and noted the following in the patient's chart:   . Medical and social history . Use of alcohol, tobacco or illicit drugs  . Current medications and supplements . Functional ability and status . Nutritional status . Physical activity . Advanced directives . List of other physicians . Hospitalizations, surgeries, and ER visits in previous 12 months . Vitals . Screenings to include cognitive, depression, and  falls . Referrals and appointments  In addition, I have reviewed and discussed with Renee [name] certain preventive protocols, quality metrics, and best practice recommendations. A written personalized care plan for preventive services as well as general preventive health recommendations is available and can be mailed to the patient at her request.      Baldomero Lamy, LPN  0/16/0109

## 2020-07-21 NOTE — Addendum Note (Signed)
Addended by: Humbert Morozov B on: 07/21/2020 10:49 AM   Modules accepted: Level of Service  

## 2020-07-22 ENCOUNTER — Other Ambulatory Visit: Payer: Self-pay | Admitting: "Endocrinology

## 2020-07-22 DIAGNOSIS — IMO0002 Reserved for concepts with insufficient information to code with codable children: Secondary | ICD-10-CM

## 2020-07-28 ENCOUNTER — Other Ambulatory Visit: Payer: Self-pay | Admitting: "Endocrinology

## 2020-07-28 DIAGNOSIS — E1165 Type 2 diabetes mellitus with hyperglycemia: Secondary | ICD-10-CM

## 2020-07-28 DIAGNOSIS — IMO0002 Reserved for concepts with insufficient information to code with codable children: Secondary | ICD-10-CM

## 2020-09-11 DIAGNOSIS — M79676 Pain in unspecified toe(s): Secondary | ICD-10-CM | POA: Diagnosis not present

## 2020-09-11 DIAGNOSIS — B351 Tinea unguium: Secondary | ICD-10-CM | POA: Diagnosis not present

## 2020-09-11 DIAGNOSIS — L84 Corns and callosities: Secondary | ICD-10-CM | POA: Diagnosis not present

## 2020-09-11 DIAGNOSIS — E1151 Type 2 diabetes mellitus with diabetic peripheral angiopathy without gangrene: Secondary | ICD-10-CM | POA: Diagnosis not present

## 2020-10-06 ENCOUNTER — Emergency Department (HOSPITAL_COMMUNITY)
Admission: EM | Admit: 2020-10-06 | Discharge: 2020-10-06 | Disposition: A | Discharge status: Home / Self Care | Payer: Medicare Other | Attending: Emergency Medicine | Admitting: Emergency Medicine

## 2020-10-06 ENCOUNTER — Encounter (HOSPITAL_COMMUNITY): Payer: Self-pay

## 2020-10-06 ENCOUNTER — Other Ambulatory Visit: Payer: Self-pay

## 2020-10-06 DIAGNOSIS — Z853 Personal history of malignant neoplasm of breast: Secondary | ICD-10-CM | POA: Diagnosis not present

## 2020-10-06 DIAGNOSIS — I10 Essential (primary) hypertension: Secondary | ICD-10-CM | POA: Insufficient documentation

## 2020-10-06 DIAGNOSIS — R519 Headache, unspecified: Secondary | ICD-10-CM | POA: Diagnosis present

## 2020-10-06 DIAGNOSIS — Z7984 Long term (current) use of oral hypoglycemic drugs: Secondary | ICD-10-CM | POA: Insufficient documentation

## 2020-10-06 DIAGNOSIS — E1140 Type 2 diabetes mellitus with diabetic neuropathy, unspecified: Secondary | ICD-10-CM | POA: Diagnosis not present

## 2020-10-06 DIAGNOSIS — M5481 Occipital neuralgia: Secondary | ICD-10-CM | POA: Insufficient documentation

## 2020-10-06 DIAGNOSIS — Z7982 Long term (current) use of aspirin: Secondary | ICD-10-CM | POA: Insufficient documentation

## 2020-10-06 LAB — BASIC METABOLIC PANEL
Anion gap: 9 (ref 5–15)
BUN: 14 mg/dL (ref 8–23)
CO2: 24 mmol/L (ref 22–32)
Calcium: 9.3 mg/dL (ref 8.9–10.3)
Chloride: 101 mmol/L (ref 98–111)
Creatinine, Ser: 0.89 mg/dL (ref 0.44–1.00)
GFR, Estimated: >60 mL/min (ref >60)
Glucose, Bld: 277 mg/dL — ABNORMAL HIGH (ref 70–99)
Potassium: 4 mmol/L (ref 3.5–5.1)
Sodium: 134 mmol/L — ABNORMAL LOW (ref 135–145)

## 2020-10-06 LAB — CBC WITH DIFFERENTIAL/PLATELET
Abs Immature Granulocytes: 0.02 10*3/uL (ref 0.00–0.07)
Basophils Absolute: 0.1 10*3/uL (ref 0.0–0.1)
Basophils Relative: 1 %
Eosinophils Absolute: 0.1 10*3/uL (ref 0.0–0.5)
Eosinophils Relative: 1 %
HCT: 37.6 % (ref 36.0–46.0)
Hemoglobin: 12.4 g/dL (ref 12.0–15.0)
Immature Granulocytes: 0 %
Lymphocytes Relative: 26 %
Lymphs Abs: 1.5 10*3/uL (ref 0.7–4.0)
MCH: 28.8 pg (ref 26.0–34.0)
MCHC: 33 g/dL (ref 30.0–36.0)
MCV: 87.2 fL (ref 80.0–100.0)
Monocytes Absolute: 0.5 10*3/uL (ref 0.1–1.0)
Monocytes Relative: 9 %
Neutro Abs: 3.7 10*3/uL (ref 1.7–7.7)
Neutrophils Relative %: 63 %
Platelets: 294 10*3/uL (ref 150–400)
RBC: 4.31 MIL/uL (ref 3.87–5.11)
RDW: 13.2 % (ref 11.5–15.5)
WBC: 5.8 10*3/uL (ref 4.0–10.5)
nRBC: 0 % (ref 0.0–0.2)

## 2020-10-06 MED ORDER — KETOROLAC TROMETHAMINE 30 MG/ML IJ SOLN
30.0000 mg | Freq: Once | INTRAMUSCULAR | Status: AC
  Administered 2020-10-06: 30 mg via INTRAMUSCULAR
  Filled 2020-10-06: qty 1

## 2020-10-06 MED ORDER — METOCLOPRAMIDE HCL 5 MG/ML IJ SOLN
10.0000 mg | Freq: Once | INTRAMUSCULAR | Status: AC
  Administered 2020-10-06: 10 mg via INTRAMUSCULAR
  Filled 2020-10-06: qty 2

## 2020-10-06 MED ORDER — BACLOFEN 10 MG PO TABS: 10.0000 mg | ORAL_TABLET | Freq: Three times a day (TID) | ORAL | 0 refills | Status: DC

## 2020-10-06 NOTE — ED Provider Notes (Signed)
[facility] EMERGENCY DEPARTMENT Provider Note   CSN: [id number] Arrival date & time: 10/06/20  0848     History Chief Complaint  Patient presents with  . Headache    Renee [name] is a 69 y.o. female.  HPI     Renee [name] is a 69 y.o. female who presents to the Emergency Department complaining of a localized area of pain and swelling to left scalp.  Symptoms have been present for 2 days.  She describes having intermittent sharp, stinging sensation to her left scalp.  When the pain is present, she states that it causes her to "twitch" and states it feels like a shock.  Nothing makes it better or worse.  He denies neck pain, pain or numbness of her left arm, jaw or chest.  No visual changes,  No recent illness, fever or chills, no neck stiffness or known injury    Past Medical History:  Diagnosis Date  . Arthritis   . Breast cancer ([place]) 2001   right mastectomy  . CVA (cerebral infarction) 2003, 2005  . DM (diabetes mellitus) ([place])   . GERD (gastroesophageal reflux disease)   . Heart murmur   . Hyperlipidemia   . Hypertension   . Seizures ([place])   . Vitamin D deficiency     Patient Active Problem List   Diagnosis Date Noted  . Essential hypertension, benign 02/15/2019  . Mixed hyperlipidemia 02/15/2019  . Overweight (BMI 25.0-29.9) 01/03/2017  . Constipation 01/03/2017  . Diabetic peripheral neuropathy ([place]) 09/30/2016  . Uncontrolled type 2 diabetes mellitus, without long-term current use of insulin ([place]) 07/05/2016  . Genetic testing 05/09/2016  . History of breast cancer in female 04/02/2016  . Family history of breast cancer in sister 04/02/2016  . GERD (gastroesophageal reflux disease) 02/05/2014  . Hyperlipidemia associated with type 2 diabetes mellitus ([place]) 02/05/2014  . Hypertension associated with diabetes ([place]) 02/05/2014  . History of CVA (cerebrovascular accident) 02/05/2014    Past Surgical History:  Procedure Laterality Date  . ABDOMINAL  HYSTERECTOMY  2000  . GANGLION CYST EXCISION  2010   right (2010)  and left (before 2010) hand  . MASTECTOMY  2001   right  . TONSILECTOMY/ADENOIDECTOMY WITH MYRINGOTOMY Bilateral    age 71's     OB History   No obstetric history on file.     Family History  Problem Relation Age of Onset  . Alzheimer's disease Mother   . Parkinson's disease Mother   . Heart Problems Father   . Kidney disease Father        +dialysis  . Heart disease Father   . Other Sister        +TAH for fibroids  . Cancer Paternal Aunt        NOS cancer  . Breast cancer Sister        dx. 28-56  . Renal cancer Sister        dx. 62-63; not a smoker  . Early death Brother   . Diabetes Brother   . Colon cancer Neg Hx   . Stomach cancer Neg Hx     Social History   Tobacco Use  . Smoking status: Never Smoker  . Smokeless tobacco: Never Used  Vaping Use  . Vaping Use: Never used  Substance Use Topics  . Alcohol use: Not Currently    Alcohol/week: 1.0 standard drink    Types: 1 Glasses of wine per week  . Drug use: No    Home  Medications Prior to Admission medications   Medication Sig Start Date End Date Taking? Authorizing Provider  aspirin 81 MG tablet Take 81 mg by mouth daily.   Yes [provider]  atorvastatin (LIPITOR) 40 MG tablet Take 1 tablet by mouth once daily 05/06/20  Yes Hawks, Christy A, FNP  bisoprolol-hydrochlorothiazide (ZIAC) 2.5-6.25 MG tablet Take 1 tablet by mouth daily. (Needs to be seen before next refill) 11/05/19  Yes Hawks, Christy A, FNP  docusate sodium (COLACE) 100 MG capsule Take 100 mg by mouth daily.   Yes [provider]  gabapentin (NEURONTIN) 400 MG capsule Take 1 capsule (400 mg total) by mouth 3 (three) times daily. 11/05/19  Yes Hawks, Christy A, FNP  glipiZIDE (GLUCOTROL XL) 5 MG 24 hr tablet Take 1 tablet (5 mg total) by mouth daily with breakfast. 11/05/19  Yes Hawks, Christy A, FNP  ibuprofen (ADVIL) 200 MG tablet Take 200 mg by mouth as  needed for mild pain or moderate pain.   Yes [provider]  Multiple Vitamin (MULTIVITAMIN WITH MINERALS) TABS tablet Take 1 tablet by mouth daily.   Yes [provider]  omeprazole (PRILOSEC) 20 MG capsule Take 1 capsule (20 mg total) by mouth daily. 11/05/19  Yes Hawks, Christy A, FNP  sitaGLIPtin (JANUVIA) 100 MG tablet Take 100 mg by mouth daily.   Yes [provider]  Vitamin D, Ergocalciferol, (DRISDOL) 1.25 MG (50000 UT) CAPS capsule Take 1 capsule (50,000 Units total) by mouth every 7 (seven) days. Patient taking differently: Take 50,000 Units by mouth every 14 (fourteen) days.  01/02/19  Yes Hawks, Christy A, FNP  Blood Glucose Calibration (ONETOUCH VERIO) SOLN Use as needed to calibrate glucometer 01/05/16   Cherre Robins, PharmD  Blood Glucose Monitoring Suppl ([place]) w/Device KIT Use to check blood sugar twice daily 10/30/18   Evelina Dun A, FNP  glucose blood (ONETOUCH VERIO) test strip Test glucose twice daily Dx E11.65 11/26/19   Evelina Dun A, FNP  insulin degludec (TRESIBA FLEXTOUCH) 200 UNIT/ML FlexTouch Pen Inject 70 Units into the skin at bedtime. Patient not taking: Reported on 10/06/2020 05/27/20   Cassandria Anger, MD  [facility] DELICA LANCETS 38S MISC Use to check BG up to bid.  Dx: E11.65 type 2 DM with insulin therapy, uncontrolled 01/05/16   Cherre Robins, PharmD    Allergies    Metformin and related and Penicillins  Review of Systems   Review of Systems  Constitutional: Negative for activity change, appetite change and fever.  HENT: Negative for facial swelling and trouble swallowing.   Eyes: Negative for photophobia, pain and visual disturbance.  Respiratory: Negative for shortness of breath.   Cardiovascular: Negative for chest pain.  Gastrointestinal: Negative for nausea and vomiting.  Musculoskeletal: Negative for neck pain and neck stiffness.  Skin: Negative for rash and wound.  Neurological: Positive for  headaches. Negative for dizziness, seizures, facial asymmetry, speech difficulty, weakness, light-headedness and numbness.  Psychiatric/Behavioral: Negative for confusion and decreased concentration.    Physical Exam Updated Vital Signs BP 133/67   Pulse 65   Temp 98.5 F (36.9 C) (Oral)   Resp 18   Ht 5' 6" (1.676 m)   Wt 75.8 kg   SpO2 99%   BMI 26.95 kg/m   Physical Exam Vitals and nursing note reviewed.  Constitutional:      General: She is not in acute distress.    Appearance: Normal appearance. She is well-developed. She is not ill-appearing.  HENT:  Head: Normocephalic and atraumatic.     Comments: No palpable masses, nodules or skin changes of the left scalp or neck Eyes:     Extraocular Movements: Extraocular movements intact.     Conjunctiva/sclera: Conjunctivae normal.     Pupils: Pupils are equal, round, and reactive to light.  Neck:     Trachea: Phonation normal.     Meningeal: Kernig's sign absent.  Cardiovascular:     Rate and Rhythm: Normal rate and regular rhythm.     Pulses: Normal pulses.  Pulmonary:     Effort: Pulmonary effort is normal. No respiratory distress.     Breath sounds: Normal breath sounds.  Abdominal:     Palpations: Abdomen is soft.     Tenderness: There is no abdominal tenderness.  Musculoskeletal:        General: Normal range of motion.     Cervical back: Full passive range of motion without pain, normal range of motion and neck supple. No rigidity. No spinous process tenderness or muscular tenderness.  Lymphadenopathy:     Head:     Left side of head: No occipital adenopathy.     Cervical: No cervical adenopathy.  Skin:    General: Skin is warm.     Capillary Refill: Capillary refill takes less than 2 seconds.     Findings: No rash.  Neurological:     General: No focal deficit present.     Mental Status: She is alert.     GCS: GCS eye subscore is 4. GCS verbal subscore is 5. GCS motor subscore is 6.     Cranial Nerves:  No cranial nerve deficit.     Sensory: Sensation is intact. No sensory deficit.     Motor: Motor function is intact. No abnormal muscle tone.     Coordination: Coordination is intact. Coordination normal.     Gait: Gait normal.     Deep Tendon Reflexes:     Reflex Scores:      Tricep reflexes are 2+ on the right side and 2+ on the left side.      Bicep reflexes are 2+ on the right side and 2+ on the left side.    Comments: CN III-XII  Intact.  Speech clear.  No pronator drift.  nml finger nose testing.    Psychiatric:        Thought Content: Thought content normal.     ED Results / Procedures / Treatments   Labs (all labs ordered are listed, but only abnormal results are displayed) Labs Reviewed  BASIC METABOLIC PANEL - Abnormal; Notable for the following components:      Result Value   Sodium 134 (*)    Glucose, Bld 277 (*)    All other components within normal limits  CBC WITH DIFFERENTIAL/PLATELET    EKG None  Radiology No results found.  Procedures Procedures (including critical care time)  Medications Ordered in ED Medications  ketorolac (TORADOL) 30 MG/ML injection 30 mg (30 mg Intramuscular Given 10/06/20 1013)  metoCLOPramide (REGLAN) injection 10 mg (10 mg Intramuscular Given 10/06/20 1013)    ED Course  I have reviewed the triage vital signs and the nursing notes.  Pertinent labs & imaging results that were available during my care of the patient were reviewed by me and considered in my medical decision making (see chart for details).    MDM Rules/Calculators/A&P  Pt here with left sided headache with intermittent "stinging and twitching" sensation.  Pain is very localized. No meningeal signs.  No focal neuro deficits.  No concerning symptoms for [facility].  No indication for imaging.   Symptoms likely related to occipital neuralgia.  On recheck she reports feeling better after medication given,  She agrees to close out patient f/u.   Strict return precautions discussed.     Final Clinical Impression(s) / ED Diagnoses Final diagnoses:  Occipital neuralgia of left side    Rx / DC Orders ED Discharge Orders    None       Kem Parkinson, PA-C 10/09/20 1445    Truddie Hidden, MD 10/09/20 [phone]

## 2020-10-06 NOTE — Discharge Instructions (Addendum)
Take the prescribed medication as directed.  This may cause some drowsiness.  Follow-up with your primary doctor for recheck.  Return emergency department if you develop any worsening symptoms.  Your blood sugar today was elevated.  Be sure to take your diabetes medication as directed every day and closely monitor your blood sugars at home.

## 2020-10-06 NOTE — ED Triage Notes (Signed)
Pt reports pain and swelling to left back of her head It started 2 days ago and has gotten worse. Described as a sharp pain that shoots up the side of her head. No injury

## 2020-10-07 ENCOUNTER — Telehealth: Payer: Self-pay

## 2020-10-07 NOTE — Telephone Encounter (Signed)
Appointment scheduled.

## 2020-10-10 ENCOUNTER — Ambulatory Visit: Payer: BC Managed Care – PPO | Admitting: Family Medicine

## 2020-10-16 ENCOUNTER — Other Ambulatory Visit: Payer: Self-pay

## 2020-10-16 ENCOUNTER — Encounter: Payer: Self-pay | Admitting: Nurse Practitioner

## 2020-10-16 ENCOUNTER — Ambulatory Visit (INDEPENDENT_AMBULATORY_CARE_PROVIDER_SITE_OTHER): Payer: Medicare Other | Admitting: Nurse Practitioner

## 2020-10-16 VITALS — BP 127/69 | HR 72 | Temp 96.4°F | Ht 66.0 in | Wt 174.4 lb

## 2020-10-16 DIAGNOSIS — I152 Hypertension secondary to endocrine disorders: Secondary | ICD-10-CM

## 2020-10-16 DIAGNOSIS — Z09 Encounter for follow-up examination after completed treatment for conditions other than malignant neoplasm: Secondary | ICD-10-CM | POA: Diagnosis not present

## 2020-10-16 DIAGNOSIS — IMO0002 Reserved for concepts with insufficient information to code with codable children: Secondary | ICD-10-CM

## 2020-10-16 DIAGNOSIS — E1159 Type 2 diabetes mellitus with other circulatory complications: Secondary | ICD-10-CM | POA: Diagnosis not present

## 2020-10-16 DIAGNOSIS — M5481 Occipital neuralgia: Secondary | ICD-10-CM

## 2020-10-16 DIAGNOSIS — E1165 Type 2 diabetes mellitus with hyperglycemia: Secondary | ICD-10-CM

## 2020-10-16 MED ORDER — GLIPIZIDE 10 MG PO TABS: 10.0000 mg | ORAL_TABLET | Freq: Two times a day (BID) | ORAL | 3 refills | Status: DC

## 2020-10-16 NOTE — Progress Notes (Signed)
Established Patient Office Visit  Subjective:  Patient ID: Renee [name], female    DOB: April 16, 1951  Age: 69 y.o. MRN: [id number]  CC:  Chief Complaint  Patient presents with  . Follow-up    ER    HPI Renee [name] presents for The patient presents with history of  type 2 diabetes mellitus without complications. Patient was diagnosed in 02/06/2016. Compliance with treatment has been good; the patient takes medication as directed , maintains a diabetic diet and an exercise regimen , follows up as directed , and is keeping a glucose diary. Sugars runs 200-300. Patient specifically denies associated symptoms, including blurred vision, fatigue, polydipsia, polyphagia and polyuria . Patient denies hypoglycemia. In regard to preventative care, the patient performs foot self-exams daily and last ophthalmology exam was in 39 19.  Patient discontinued her Tyler Aas due to side effects [blurred vision]  Occipital neuralgia:  ED follow-up for sympathetic neuralgia.  Patient went to the emergency department complaining of a localized area of pain and swelling to her left scalp.  Patient described pain as sharp tingling sensation in her left scalp, patient was treated and sent home.  Patient reports she is feeling better, no signs and symptoms of headache, tingling, visual changes fever or stiffness of neck.        Past Medical History:  Diagnosis Date  . Arthritis   . Breast cancer ([place]) 2001   right mastectomy  . CVA (cerebral infarction) 2003, 2005  . DM (diabetes mellitus) ([place])   . GERD (gastroesophageal reflux disease)   . Heart murmur   . Hyperlipidemia   . Hypertension   . Seizures ([place])   . Vitamin D deficiency     Past Surgical History:  Procedure Laterality Date  . ABDOMINAL HYSTERECTOMY  2000  . GANGLION CYST EXCISION  2010   right (2010)  and left (before 2010) hand  . MASTECTOMY  2001   right  . TONSILECTOMY/ADENOIDECTOMY WITH MYRINGOTOMY Bilateral    age 28's     Family History  Problem Relation Age of Onset  . Alzheimer's disease Mother   . Parkinson's disease Mother   . Heart Problems Father   . Kidney disease Father        +dialysis  . Heart disease Father   . Other Sister        +TAH for fibroids  . Cancer Paternal Aunt        NOS cancer  . Breast cancer Sister        dx. 71-56  . Renal cancer Sister        dx. 62-63; not a smoker  . Early death Brother   . Diabetes Brother   . Colon cancer Neg Hx   . Stomach cancer Neg Hx     Social History   Socioeconomic History  . Marital status: Single    Spouse name: Not on file  . Number of children: Not on file  . Years of education: 55  . Highest education level: Associate degree: academic program  Occupational History  . Occupation: Retired    Fish farm manager: [employer]    Comment: [employer]   Tobacco Use  . Smoking status: Never Smoker  . Smokeless tobacco: Never Used  Vaping Use  . Vaping Use: Never used  Substance and Sexual Activity  . Alcohol use: Not Currently    Alcohol/week: 1.0 standard drink    Types: 1 Glasses of wine per week  . Drug use:  No  . Sexual activity: Yes    Birth control/protection: Post-menopausal  Other Topics Concern  . Not on file  Social History Narrative   Retired from school system, single, lives alone, no children. Enjoys reading and word puzzles.    Social Determinants of Health   Financial Resource Strain:   . Difficulty of Paying Living Expenses: Not on file  Food Insecurity:   . Worried About Charity fundraiser in the Last Year: Not on file  . Ran Out of Food in the Last Year: Not on file  Transportation Needs:   . Lack of Transportation (Medical): Not on file  . Lack of Transportation (Non-Medical): Not on file  Physical Activity:   . Days of Exercise per Week: Not on file  . Minutes of Exercise per Session: Not on file  Stress:   . Feeling of Stress : Not on file  Social Connections:    . Frequency of Communication with Friends and Family: Not on file  . Frequency of Social Gatherings with Friends and Family: Not on file  . Attends Religious Services: Not on file  . Active Member of Clubs or Organizations: Not on file  . Attends Archivist Meetings: Not on file  . Marital Status: Not on file  Intimate Partner Violence:   . Fear of Current or Ex-Partner: Not on file  . Emotionally Abused: Not on file  . Physically Abused: Not on file  . Sexually Abused: Not on file    Outpatient Medications Prior to Visit  Medication Sig Dispense Refill  . aspirin 81 MG tablet Take 81 mg by mouth daily.    Marland Kitchen atorvastatin (LIPITOR) 40 MG tablet Take 1 tablet by mouth once daily 90 tablet 1  . baclofen (LIORESAL) 10 MG tablet Take 1 tablet (10 mg total) by mouth 3 (three) times daily. 15 tablet 0  . bisoprolol-hydrochlorothiazide (ZIAC) 2.5-6.25 MG tablet Take 1 tablet by mouth daily. (Needs to be seen before next refill) 90 tablet 3  . Blood Glucose Calibration (ONETOUCH VERIO) SOLN Use as needed to calibrate glucometer 1 each 0  . Blood Glucose Monitoring Suppl (ONETOUCH VERIO FLEX SYSTEM) w/Device KIT Use to check blood sugar twice daily 1 kit 0  . docusate sodium (COLACE) 100 MG capsule Take 100 mg by mouth daily.    Marland Kitchen gabapentin (NEURONTIN) 400 MG capsule Take 1 capsule (400 mg total) by mouth 3 (three) times daily. 270 capsule 3  . glucose blood (ONETOUCH VERIO) test strip Test glucose twice daily Dx E11.65 200 each 3  . ibuprofen (ADVIL) 200 MG tablet Take 200 mg by mouth as needed for mild pain or moderate pain.    . Multiple Vitamin (MULTIVITAMIN WITH MINERALS) TABS tablet Take 1 tablet by mouth daily.    Marland Kitchen omeprazole (PRILOSEC) 20 MG capsule Take 1 capsule (20 mg total) by mouth daily. 90 capsule 3  . ONETOUCH DELICA LANCETS 00T MISC Use to check BG up to bid.  Dx: E11.65 type 2 DM with insulin therapy, uncontrolled 100 each 2  . sitaGLIPtin (JANUVIA) 100 MG tablet  Take 100 mg by mouth daily.    . Vitamin D, Ergocalciferol, (DRISDOL) 1.25 MG (50000 UT) CAPS capsule Take 1 capsule (50,000 Units total) by mouth every 7 (seven) days. (Patient taking differently: Take 50,000 Units by mouth every 14 (fourteen) days. ) 12 capsule 3  . glipiZIDE (GLUCOTROL XL) 5 MG 24 hr tablet Take 1 tablet (5 mg total) by mouth daily with  breakfast. 90 tablet 3  . insulin degludec (TRESIBA FLEXTOUCH) 200 UNIT/ML FlexTouch Pen Inject 70 Units into the skin at bedtime. (Patient not taking: Reported on 10/16/2020) 6 pen 2   No facility-administered medications prior to visit.    Allergies  Allergen Reactions  . Metformin And Related Other (See Comments)    seizures  . Penicillins     ROS Review of Systems  Neurological: Negative for syncope, weakness, light-headedness, numbness and headaches.  All other systems reviewed and are negative.     Objective:    Physical Exam Vitals reviewed.  Constitutional:      Appearance: Normal appearance.  HENT:     Head: Normocephalic.     Nose: Nose normal.  Eyes:     Conjunctiva/sclera: Conjunctivae normal.  Cardiovascular:     Rate and Rhythm: Normal rate and regular rhythm.     Pulses: Normal pulses.     Heart sounds: Normal heart sounds.  Pulmonary:     Effort: Pulmonary effort is normal.     Breath sounds: Normal breath sounds.  Musculoskeletal:        General: No tenderness. Normal range of motion.     Cervical back: No rigidity.  Skin:    General: Skin is warm.  Neurological:     Mental Status: She is alert and oriented to person, place, and time.     Motor: No weakness.  Psychiatric:        Mood and Affect: Mood normal.        Behavior: Behavior normal.     BP 127/69   Pulse 72   Temp (!) 96.4 F (35.8 C)   Ht '5\' 6"'  (1.676 m)   Wt 174 lb 6.4 oz (79.1 kg)   SpO2 98%   BMI 28.15 kg/m  Wt Readings from Last 3 Encounters:  10/16/20 174 lb 6.4 oz (79.1 kg)  10/06/20 167 lb (75.8 kg)  05/27/20 183 lb  (83 kg)     Health Maintenance Due  Topic Date Due  . COVID-19 Vaccine (1) Never done  . OPHTHALMOLOGY EXAM  09/22/2019  . MAMMOGRAM  04/25/2020  . HEMOGLOBIN A1C  09/26/2020     Lab Results  Component Value Date   TSH 2.710 07/05/2016   Lab Results  Component Value Date   WBC 5.8 10/06/2020   HGB 12.4 10/06/2020   HCT 37.6 10/06/2020   MCV 87.2 10/06/2020   PLT 294 10/06/2020   Lab Results  Component Value Date   NA 134 (L) 10/06/2020   K 4.0 10/06/2020   CO2 24 10/06/2020   GLUCOSE 277 (H) 10/06/2020   BUN 14 10/06/2020   CREATININE 0.89 10/06/2020   BILITOT 0.2 03/27/2020   ALKPHOS 107 03/27/2020   AST 22 03/27/2020   ALT 22 03/27/2020   PROT 7.2 03/27/2020   ALBUMIN 4.4 03/27/2020   CALCIUM 9.3 10/06/2020   ANIONGAP 9 10/06/2020   Lab Results  Component Value Date   CHOL 146 11/05/2019   Lab Results  Component Value Date   HDL 47 11/05/2019   Lab Results  Component Value Date   LDLCALC 82 11/05/2019   Lab Results  Component Value Date   TRIG 87 11/05/2019   Lab Results  Component Value Date   CHOLHDL 3.1 11/05/2019   Lab Results  Component Value Date   HGBA1C 10.4 (H) 03/27/2020      Assessment & Plan:   Problem List Items Addressed This Visit      Cardiovascular  and Mediastinum   Hypertension associated with diabetes ([place]) - Primary   Relevant Medications   glipiZIDE (GLUCOTROL) 10 MG tablet     Endocrine   Uncontrolled type 2 diabetes mellitus, without long-term current use of insulin ([place])    Patient is following up today for type 2 diabetes mellitus.  Patient reported discontinuing Tyler Aas due to side effects with increased blurry vision.  Since June.  Provided education to patient with printed handouts given.  Changed glipizide from 5 mg to 10 mg by mouth daily with breakfast.  Continue Januvia 100 mg tablet by mouth daily.  Check blood sugars daily.  Follow-up in 3 months for repeat A1c.      Relevant Medications   glipiZIDE  (GLUCOTROL) 10 MG tablet     Other   Occipital neuralgia of right side    Patient's occipital neuralgia of the right side is resolved.  Patient is not reporting any blurred vision, headaches, tingling, fever or stiff neck. Patient knows to follow-up with any worsening or unresolved symptoms.  Completed ED discharge instructions.  Patient verbalized understanding.  Provided instruction to patient with printed handouts given.      Hospital discharge follow-up      Meds ordered this encounter  Medications  . glipiZIDE (GLUCOTROL) 10 MG tablet    Sig: Take 1 tablet (10 mg total) by mouth 2 (two) times daily before a meal.    Dispense:  60 tablet    Refill:  3    Order Specific Question:   Supervising Provider    Answer:   Caryl Pina A [[id number]]    Follow-up: Return in about 3 months (around 01/16/2021).    Ivy Lynn, NP

## 2020-10-16 NOTE — Assessment & Plan Note (Signed)
Patient is following up today for type 2 diabetes mellitus.  Patient reported discontinuing Tyler Aas due to side effects with increased blurry vision.  Since June.  Provided education to patient with printed handouts given.  Changed glipizide from 5 mg to 10 mg by mouth daily with breakfast.  Continue Januvia 100 mg tablet by mouth daily.  Check blood sugars daily.  Follow-up in 3 months for repeat A1c.

## 2020-10-16 NOTE — Patient Instructions (Signed)
Discontinued Tyler Aas. changed glipizide to 10 mg by mouth daily, follow up in 3 months. Monitor blood sugars at home and keep log.  Call with worsening or unresolved symptoms  Diabetes Basics  Diabetes (diabetes mellitus) is a long-term (chronic) disease. It occurs when the body does not properly use sugar (glucose) that is released from food after you eat. Diabetes may be caused by one or both of these problems:  Your pancreas does not make enough of a hormone called insulin.  Your body does not react in a normal way to insulin that it makes. Insulin lets sugars (glucose) go into cells in your body. This gives you energy. If you have diabetes, sugars cannot get into cells. This causes high blood sugar (hyperglycemia). Follow these instructions at home: How is diabetes treated? You may need to take insulin or other diabetes medicines daily to keep your blood sugar in balance. Take your diabetes medicines every day as told by your doctor. List your diabetes medicines here: Diabetes medicines  Name of medicine: ______________________________ ? Amount (dose): _______________ Time (a.m./p.m.): _______________ Notes: ___________________________________  Name of medicine: ______________________________ ? Amount (dose): _______________ Time (a.m./p.m.): _______________ Notes: ___________________________________  Name of medicine: ______________________________ ? Amount (dose): _______________ Time (a.m./p.m.): _______________ Notes: ___________________________________ If you use insulin, you will learn how to give yourself insulin by injection. You may need to adjust the amount based on the food that you eat. List the types of insulin you use here: Insulin  Insulin type: ______________________________ ? Amount (dose): _______________ Time (a.m./p.m.): _______________ Notes: ___________________________________  Insulin type: ______________________________ ? Amount (dose): _______________ Time  (a.m./p.m.): _______________ Notes: ___________________________________  Insulin type: ______________________________ ? Amount (dose): _______________ Time (a.m./p.m.): _______________ Notes: ___________________________________  Insulin type: ______________________________ ? Amount (dose): _______________ Time (a.m./p.m.): _______________ Notes: ___________________________________  Insulin type: ______________________________ ? Amount (dose): _______________ Time (a.m./p.m.): _______________ Notes: ___________________________________ How do I manage my blood sugar?  Check your blood sugar levels using a blood glucose monitor as directed by your doctor. Your doctor will set treatment goals for you. Generally, you should have these blood sugar levels:  Before meals (preprandial): 80-130 mg/dL (4.4-7.2 mmol/L).  After meals (postprandial): below 180 mg/dL (10 mmol/L).  A1c level: less than 7%. Write down the times that you will check your blood sugar levels: Blood sugar checks  Time: _______________ Notes: ___________________________________  Time: _______________ Notes: ___________________________________  Time: _______________ Notes: ___________________________________  Time: _______________ Notes: ___________________________________  Time: _______________ Notes: ___________________________________  Time: _______________ Notes: ___________________________________  What do I need to know about low blood sugar? Low blood sugar is called hypoglycemia. This is when blood sugar is at or below 70 mg/dL (3.9 mmol/L). Symptoms may include:  Feeling: ? Hungry. ? Worried or nervous (anxious). ? Sweaty and clammy. ? Confused. ? Dizzy. ? Sleepy. ? Sick to your stomach (nauseous).  Having: ? A fast heartbeat. ? A headache. ? A change in your vision. ? Tingling or no feeling (numbness) around the mouth, lips, or tongue. ? Jerky movements that you cannot control  (seizure).  Having trouble with: ? Moving (coordination). ? Sleeping. ? Passing out (fainting). ? Getting upset easily (irritability). Treating low blood sugar To treat low blood sugar, eat or drink something sugary right away. If you can think clearly and swallow safely, follow the 15:15 rule:  Take 15 grams of a fast-acting carb (carbohydrate). Talk with your doctor about how much you should take.  Some fast-acting carbs are: ? Sugar tablets (glucose pills). Take 3-4 glucose pills. ? 6-8  pieces of hard candy. ? 4-6 oz (120-150 mL) of fruit juice. ? 4-6 oz (120-150 mL) of regular (not diet) soda. ? 1 Tbsp (15 mL) honey or sugar.  Check your blood sugar 15 minutes after you take the carb.  If your blood sugar is still at or below 70 mg/dL (3.9 mmol/L), take 15 grams of a carb again.  If your blood sugar does not go above 70 mg/dL (3.9 mmol/L) after 3 tries, get help right away.  After your blood sugar goes back to normal, eat a meal or a snack within 1 hour. Treating very low blood sugar If your blood sugar is at or below 54 mg/dL (3 mmol/L), you have very low blood sugar (severe hypoglycemia). This is an emergency. Do not wait to see if the symptoms will go away. Get medical help right away. Call your local emergency services (911 in the [place] Do not drive yourself to the hospital. Questions to ask your health care provider  Do I need to meet with a diabetes educator?  What equipment will I need to care for myself at home?  What diabetes medicines do I need? When should I take them?  How often do I need to check my blood sugar?  What number can I call if I have questions?  When is my next doctor's visit?  Where can I find a support group for people with diabetes? Where to find more information  American Diabetes Association: www.diabetes.org  American Association of Diabetes Educators: www.diabeteseducator.org/patient-resources Contact a doctor if:  Your blood  sugar is at or above 240 mg/dL (13.3 mmol/L) for 2 days in a row.  You have been sick or have had a fever for 2 days or more, and you are not getting better.  You have any of these problems for more than 6 hours: ? You cannot eat or drink. ? You feel sick to your stomach (nauseous). ? You throw up (vomit). ? You have watery poop (diarrhea). Get help right away if:  Your blood sugar is lower than 54 mg/dL (3 mmol/L).  You get confused.  You have trouble: ? Thinking clearly. ? Breathing. Summary  Diabetes (diabetes mellitus) is a long-term (chronic) disease. It occurs when the body does not properly use sugar (glucose) that is released from food after digestion.  Take insulin and diabetes medicines as told.  Check your blood sugar every day, as often as told.  Keep all follow-up visits as told by your doctor. This is important. This information is not intended to replace advice given to you by your health care provider. Make sure you discuss any questions you have with your health care provider. Document Revised: 09/05/2019 Document Reviewed: 03/17/2018 Elsevier Patient Education  [place]. Occipital Neuralgia  Occipital neuralgia is a type of headache that causes brief episodes of very bad pain in the back of your head. Pain from occipital neuralgia may spread (radiate) to other parts of your head. These headaches may be caused by irritation of the nerves that leave your spinal cord high up in your neck, just below the base of your skull (occipital nerves). Your occipital nerves transmit sensations from the back of your head, the top of your head, and the areas behind your ears. What are the causes? This condition can occur without any known cause (primary headache syndrome). In other cases, this condition is caused by pressure on or irritation of one of the two occipital nerves. Pressure and irritation may be due  to:  Muscle spasm in the neck.  Neck injury.  Wear  and tear of the vertebrae in the neck (osteoarthritis).  Disease of the disks that separate the vertebrae.  Swollen blood vessels that put pressure on the occipital nerves.  Infections.  Tumors.  Diabetes. What are the signs or symptoms? This condition causes brief burning, stabbing, electric, shocking, or shooting pain which can radiate to the top of the head. It can happen on one side or both sides of the head. It can also cause:  Pain behind the eye.  Pain triggered by neck movement or hair brushing.  Scalp tenderness.  Aching in the back of the head between episodes of very bad pain.  Pain gets worse with exposure to bright lights. How is this diagnosed? There is no test that diagnoses this condition. Your health care provider may diagnose this condition based on a physical exam and your symptoms. Other tests may be done, such as:  Imaging studies of the brain and neck (cervical spine), such as an MRI or CT scan. These look for causes of pinched nerves.  Applying pressure to the nerves in the neck to try to re-create the pain.  Injection of numbing medicine into the occipital nerve areas to see if pain goes away (diagnostic nerve block). How is this treated? Treatment for this condition may begin with simple measures, such as:  Rest.  Massage.  Applying heat or cold on the area.  Over-the-counter pain relievers. If these measures do not work, you may need other treatments, including:  Medicines, such as: ? Prescription-strength anti-inflammatory medicines. ? Muscle relaxants. ? Anti-seizure medicines, which can relieve pain. ? Antidepressants, which can relieve pain. ? Injected medicines, such as medicines that numb the area (local anesthetic) and steroids.  Pulsed radiofrequency ablation. This is when wires are implanted to deliver electrical impulses that block pain signals from the occipital nerve.  Surgery to relieve nerve pressure.  Physical  therapy. Follow these instructions at home: Pain management      Avoid any activities that cause pain.  Rest when you have an attack of pain.  Try gentle massage to relieve pain.  Try a different pillow or sleeping position.  If directed, apply heat to the affected area as told by your health care provider. Use the heat source that your health care provider recommends, such as a moist heat pack or a heating pad. ? Place a towel between your skin and the heat source. ? Leave the heat on for 20-30 minutes. ? Remove the heat if your skin turns bright red. This is especially important if you are unable to feel pain, heat, or cold. You may have a greater risk of getting burned.  If directed, apply ice to the back of the head and neck area as told by your health care provider. ? Put ice in a plastic bag. ? Place a towel between your skin and the bag. ? Leave the ice on for 20 minutes, 2-3 times per day. General instructions  Take over-the-counter and prescription medicines only as told by your health care provider.  Avoid things that make your symptoms worse, such as bright lights.  Try to stay active. Get regular exercise that does not cause pain. Ask your health care provider to suggest safe exercises for you.  Work with a physical therapist to learn stretching exercises you can do at home.  Practice good posture.  Keep all follow-up visits as told by your health care provider. This  is important. Contact a health care provider if:  Your medicine is not working.  You have new or worsening symptoms. Get help right away if:  You have very bad head pain that does not go away.  You have a sudden change in vision, balance, or speech. Summary  Occipital neuralgia is a type of headache that causes brief episodes of very bad pain in the back of your head.  Pain from occipital neuralgia may spread (radiate) to other parts of your head.  Treatment for this condition includes  rest, massage, and medicines. This information is not intended to replace advice given to you by your health care provider. Make sure you discuss any questions you have with your health care provider. Document Revised: 11/29/2017 Document Reviewed: 02/17/2017 Elsevier Patient Education  [place].

## 2020-10-16 NOTE — Assessment & Plan Note (Signed)
Patient's occipital neuralgia of the right side is resolved.  Patient is not reporting any blurred vision, headaches, tingling, fever or stiff neck. Patient knows to follow-up with any worsening or unresolved symptoms.  Completed ED discharge instructions.  Patient verbalized understanding.  Provided instruction to patient with printed handouts given.

## 2020-10-26 ENCOUNTER — Other Ambulatory Visit: Payer: Self-pay | Admitting: Family

## 2020-11-01 ENCOUNTER — Other Ambulatory Visit: Payer: Self-pay | Admitting: Family

## 2020-11-11 ENCOUNTER — Telehealth: Payer: Self-pay

## 2020-11-11 NOTE — Telephone Encounter (Signed)
Pt says that Blood Glucose Monitoring Suppl ([place]) w/Device KIT is not working. She needs a new send to [place]

## 2020-11-12 ENCOUNTER — Other Ambulatory Visit: Payer: Self-pay | Admitting: Family

## 2020-11-12 ENCOUNTER — Other Ambulatory Visit: Payer: Self-pay | Admitting: *Deleted

## 2020-11-12 DIAGNOSIS — I152 Hypertension secondary to endocrine disorders: Secondary | ICD-10-CM

## 2020-11-12 MED ORDER — ONETOUCH VERIO FLEX SYSTEM W/DEVICE KIT: PACK | 0 refills | Status: DC

## 2020-11-12 NOTE — Telephone Encounter (Signed)
Ok to send

## 2020-11-12 NOTE — Telephone Encounter (Signed)
R/C to [facility] [facility] from message yesterday

## 2020-11-12 NOTE — Telephone Encounter (Signed)
SENT AS REQUESTED 

## 2020-11-13 DIAGNOSIS — L84 Corns and callosities: Secondary | ICD-10-CM | POA: Diagnosis not present

## 2020-11-13 DIAGNOSIS — E1151 Type 2 diabetes mellitus with diabetic peripheral angiopathy without gangrene: Secondary | ICD-10-CM | POA: Diagnosis not present

## 2020-11-13 DIAGNOSIS — M79676 Pain in unspecified toe(s): Secondary | ICD-10-CM | POA: Diagnosis not present

## 2020-11-13 DIAGNOSIS — B351 Tinea unguium: Secondary | ICD-10-CM | POA: Diagnosis not present

## 2020-11-21 ENCOUNTER — Emergency Department (HOSPITAL_COMMUNITY): Payer: Medicare Other

## 2020-11-21 ENCOUNTER — Encounter (HOSPITAL_COMMUNITY): Payer: Self-pay

## 2020-11-21 ENCOUNTER — Other Ambulatory Visit: Payer: Self-pay

## 2020-11-21 ENCOUNTER — Emergency Department (HOSPITAL_COMMUNITY)
Admission: EM | Admit: 2020-11-21 | Discharge: 2020-11-21 | Disposition: A | Discharge status: Home / Self Care | Payer: Medicare Other | Attending: Emergency Medicine | Admitting: Emergency Medicine

## 2020-11-21 DIAGNOSIS — Z7984 Long term (current) use of oral hypoglycemic drugs: Secondary | ICD-10-CM | POA: Diagnosis not present

## 2020-11-21 DIAGNOSIS — R42 Dizziness and giddiness: Secondary | ICD-10-CM | POA: Diagnosis not present

## 2020-11-21 DIAGNOSIS — Z79899 Other long term (current) drug therapy: Secondary | ICD-10-CM | POA: Diagnosis not present

## 2020-11-21 DIAGNOSIS — E1140 Type 2 diabetes mellitus with diabetic neuropathy, unspecified: Secondary | ICD-10-CM | POA: Insufficient documentation

## 2020-11-21 DIAGNOSIS — E1165 Type 2 diabetes mellitus with hyperglycemia: Secondary | ICD-10-CM | POA: Insufficient documentation

## 2020-11-21 DIAGNOSIS — I10 Essential (primary) hypertension: Secondary | ICD-10-CM | POA: Insufficient documentation

## 2020-11-21 DIAGNOSIS — R739 Hyperglycemia, unspecified: Secondary | ICD-10-CM

## 2020-11-21 DIAGNOSIS — Z853 Personal history of malignant neoplasm of breast: Secondary | ICD-10-CM | POA: Insufficient documentation

## 2020-11-21 DIAGNOSIS — Z7982 Long term (current) use of aspirin: Secondary | ICD-10-CM | POA: Diagnosis not present

## 2020-11-21 DIAGNOSIS — R404 Transient alteration of awareness: Secondary | ICD-10-CM | POA: Diagnosis not present

## 2020-11-21 DIAGNOSIS — R531 Weakness: Secondary | ICD-10-CM | POA: Diagnosis not present

## 2020-11-21 LAB — CBC WITH DIFFERENTIAL/PLATELET
Abs Immature Granulocytes: 0.03 10*3/uL (ref 0.00–0.07)
Basophils Absolute: 0.1 10*3/uL (ref 0.0–0.1)
Basophils Relative: 1 %
Eosinophils Absolute: 0.1 10*3/uL (ref 0.0–0.5)
Eosinophils Relative: 2 %
HCT: 40.4 % (ref 36.0–46.0)
Hemoglobin: 12.8 g/dL (ref 12.0–15.0)
Immature Granulocytes: 1 %
Lymphocytes Relative: 27 %
Lymphs Abs: 1.5 10*3/uL (ref 0.7–4.0)
MCH: 29 pg (ref 26.0–34.0)
MCHC: 31.7 g/dL (ref 30.0–36.0)
MCV: 91.6 fL (ref 80.0–100.0)
Monocytes Absolute: 0.5 10*3/uL (ref 0.1–1.0)
Monocytes Relative: 9 %
Neutro Abs: 3.4 10*3/uL (ref 1.7–7.7)
Neutrophils Relative %: 60 %
Platelets: 253 10*3/uL (ref 150–400)
RBC: 4.41 MIL/uL (ref 3.87–5.11)
RDW: 12.8 % (ref 11.5–15.5)
WBC: 5.5 10*3/uL (ref 4.0–10.5)
nRBC: 0 % (ref 0.0–0.2)

## 2020-11-21 LAB — URINALYSIS, ROUTINE W REFLEX MICROSCOPIC
Bacteria, UA: NONE SEEN
Bilirubin Urine: NEGATIVE
Glucose, UA: >=500 mg/dL — AB
Hgb urine dipstick: NEGATIVE
Ketones, ur: NEGATIVE mg/dL
Leukocytes,Ua: NEGATIVE
Nitrite: NEGATIVE
Protein, ur: NEGATIVE mg/dL
Specific Gravity, Urine: 1.001 — ABNORMAL LOW (ref 1.005–1.030)
pH: 6 (ref 5.0–8.0)

## 2020-11-21 LAB — CBG MONITORING, ED: Glucose-Capillary: 285 mg/dL — ABNORMAL HIGH (ref 70–99)

## 2020-11-21 LAB — COMPREHENSIVE METABOLIC PANEL
ALT: 24 U/L (ref 0–44)
AST: 21 U/L (ref 15–41)
Albumin: 3.5 g/dL (ref 3.5–5.0)
Alkaline Phosphatase: 90 U/L (ref 38–126)
Anion gap: 9 (ref 5–15)
BUN: 15 mg/dL (ref 8–23)
CO2: 22 mmol/L (ref 22–32)
Calcium: 8.8 mg/dL — ABNORMAL LOW (ref 8.9–10.3)
Chloride: 104 mmol/L (ref 98–111)
Creatinine, Ser: 0.88 mg/dL (ref 0.44–1.00)
GFR, Estimated: >60 mL/min (ref >60)
Glucose, Bld: 286 mg/dL — ABNORMAL HIGH (ref 70–99)
Potassium: 4 mmol/L (ref 3.5–5.1)
Sodium: 135 mmol/L (ref 135–145)
Total Bilirubin: 0.2 mg/dL — ABNORMAL LOW (ref 0.3–1.2)
Total Protein: 6.8 g/dL (ref 6.5–8.1)

## 2020-11-21 MED ORDER — SODIUM CHLORIDE 0.9 % IV BOLUS
1000.0000 mL | Freq: Once | INTRAVENOUS | Status: AC
  Administered 2020-11-21: 1000 mL via INTRAVENOUS

## 2020-11-21 NOTE — ED Triage Notes (Signed)
Pt went to be with high BS work up high BS and elevated BP pt has not had daily medications.

## 2020-11-21 NOTE — ED Notes (Signed)
Pt assisted to bedside commode

## 2020-11-21 NOTE — ED Provider Notes (Signed)
[facility] EMERGENCY DEPARTMENT Provider Note   CSN: [id number] Arrival date & time: 11/21/20  1950     History Chief Complaint  Patient presents with  . Hyperglycemia    Renee [name] is a 69 y.o. female.  Patient states that she has had some dizziness today and her blood pressure was a little bit up and her glucose was low elevated   Hyperglycemia      Past Medical History:  Diagnosis Date  . Arthritis   . Breast cancer ([place]) 2001   right mastectomy  . CVA (cerebral infarction) 2003, 2005  . DM (diabetes mellitus) ([place])   . GERD (gastroesophageal reflux disease)   . Heart murmur   . Hyperlipidemia   . Hypertension   . Seizures ([place])   . Vitamin D deficiency     Patient Active Problem List   Diagnosis Date Noted  . Occipital neuralgia of right side 10/16/2020  . Hospital discharge follow-up 10/16/2020  . Essential hypertension, benign 02/15/2019  . Mixed hyperlipidemia 02/15/2019  . Overweight (BMI 25.0-29.9) 01/03/2017  . Constipation 01/03/2017  . Diabetic peripheral neuropathy ([place]) 09/30/2016  . Uncontrolled type 2 diabetes mellitus, without long-term current use of insulin ([place]) 07/05/2016  . Genetic testing 05/09/2016  . History of breast cancer in female 04/02/2016  . Family history of breast cancer in sister 04/02/2016  . GERD (gastroesophageal reflux disease) 02/05/2014  . Hyperlipidemia associated with type 2 diabetes mellitus ([place]) 02/05/2014  . Hypertension associated with diabetes ([place]) 02/05/2014  . History of CVA (cerebrovascular accident) 02/05/2014    Past Surgical History:  Procedure Laterality Date  . ABDOMINAL HYSTERECTOMY  2000  . GANGLION CYST EXCISION  2010   right (2010)  and left (before 2010) hand  . MASTECTOMY  2001   right  . TONSILECTOMY/ADENOIDECTOMY WITH MYRINGOTOMY Bilateral    age 65's     OB History   No obstetric history on file.     Family History  Problem Relation Age of Onset  . Alzheimer's disease  Mother   . Parkinson's disease Mother   . Heart Problems Father   . Kidney disease Father        +dialysis  . Heart disease Father   . Other Sister        +TAH for fibroids  . Cancer Paternal Aunt        NOS cancer  . Breast cancer Sister        dx. 71-56  . Renal cancer Sister        dx. 62-63; not a smoker  . Early death Brother   . Diabetes Brother   . Colon cancer Neg Hx   . Stomach cancer Neg Hx     Social History   Tobacco Use  . Smoking status: Never Smoker  . Smokeless tobacco: Never Used  Vaping Use  . Vaping Use: Never used  Substance Use Topics  . Alcohol use: Not Currently    Alcohol/week: 1.0 standard drink    Types: 1 Glasses of wine per week  . Drug use: No    Home Medications Prior to Admission medications   Medication Sig Start Date End Date Taking? Authorizing Provider  aspirin 81 MG tablet Take 81 mg by mouth daily.    [provider]  atorvastatin (LIPITOR) 40 MG tablet Take 1 tablet by mouth once daily 11/03/20   Evelina Dun A, FNP  baclofen (LIORESAL) 10 MG tablet Take 1 tablet (10 mg total) by mouth 3 (  three) times daily. 10/06/20   Triplett, Tammy, PA-C  bisoprolol-hydrochlorothiazide (ZIAC) 2.5-6.25 MG tablet Take 1 tablet by mouth daily. 11/12/20   Evelina Dun A, FNP  Blood Glucose Calibration (ONETOUCH VERIO) SOLN Use as needed to calibrate glucometer 01/05/16   Cherre Robins, PharmD  Blood Glucose Monitoring Suppl ([place]) w/Device KIT Use to check blood sugar twice daily 11/12/20   Evelina Dun A, FNP  docusate sodium (COLACE) 100 MG capsule Take 100 mg by mouth daily.    [provider]  gabapentin (NEURONTIN) 400 MG capsule Take 1 capsule (400 mg total) by mouth 3 (three) times daily. 11/05/19   Sharion Balloon, FNP  glipiZIDE (GLUCOTROL) 10 MG tablet Take 1 tablet (10 mg total) by mouth 2 (two) times daily before a meal. 10/16/20   Ivy Lynn, NP  glucose blood (ONETOUCH VERIO) test strip  TEST GLUCOSE TWICE DAILY Dx E11.95 10/27/20   Evelina Dun A, FNP  ibuprofen (ADVIL) 200 MG tablet Take 200 mg by mouth as needed for mild pain or moderate pain.    [provider]  Multiple Vitamin (MULTIVITAMIN WITH MINERALS) TABS tablet Take 1 tablet by mouth daily.    [provider]  omeprazole (PRILOSEC) 20 MG capsule Take 1 capsule (20 mg total) by mouth daily. 11/05/19   Hawks, Theador Hawthorne, FNP  ONETOUCH DELICA LANCETS 60V MISC Use to check BG up to bid.  Dx: E11.65 type 2 DM with insulin therapy, uncontrolled 01/05/16   Eckard, Tammy, PharmD  sitaGLIPtin (JANUVIA) 100 MG tablet Take 100 mg by mouth daily.    [provider]  Vitamin D, Ergocalciferol, (DRISDOL) 1.25 MG (50000 UT) CAPS capsule Take 1 capsule (50,000 Units total) by mouth every 7 (seven) days. Patient taking differently: Take 50,000 Units by mouth every 14 (fourteen) days.  01/02/19   Sharion Balloon, FNP    Allergies    Metformin and related and Penicillins  Review of Systems   Review of Systems  Physical Exam Updated Vital Signs BP (!) 142/76   Pulse 62   Temp 97.7 F (36.5 C) (Oral)   Resp 10   Ht $R'5\' 6"'HY$  (1.676 m)   Wt 75.8 kg   SpO2 100%   BMI 26.95 kg/m   Physical Exam  ED Results / Procedures / Treatments   Labs (all labs ordered are listed, but only abnormal results are displayed) Labs Reviewed  URINALYSIS, ROUTINE W REFLEX MICROSCOPIC - Abnormal; Notable for the following components:      Result Value   Color, Urine COLORLESS (*)    Specific Gravity, Urine 1.001 (*)    Glucose, UA >=500 (*)    All other components within normal limits  COMPREHENSIVE METABOLIC PANEL - Abnormal; Notable for the following components:   Glucose, Bld 286 (*)    Calcium 8.8 (*)    Total Bilirubin 0.2 (*)    All other components within normal limits  CBG MONITORING, ED - Abnormal; Notable for the following components:   Glucose-Capillary 285 (*)    All other components within normal limits    CBC WITH DIFFERENTIAL/PLATELET    EKG None  Radiology CT Head Wo Contrast  Result Date: 11/21/2020 CLINICAL DATA:  Weakness, hypertension EXAM: CT HEAD WITHOUT CONTRAST TECHNIQUE: Contiguous axial images were obtained from the base of the skull through the vertex without intravenous contrast. COMPARISON:  01/15/2016 FINDINGS: Brain: No evidence of acute infarction, hemorrhage, hydrocephalus, extra-axial collection or mass lesion/mass effect. Vascular: No hyperdense  vessel or unexpected calcification. Skull: Normal. Negative for fracture or focal lesion. Sinuses/Orbits: No acute finding. Other: None. IMPRESSION: No acute intracranial findings. Electronically Signed   By: Davina Poke D.O.   On: 11/21/2020 10:35    Procedures Procedures (including critical care time)  Medications Ordered in ED Medications  sodium chloride 0.9 % bolus 1,000 mL (0 mLs Intravenous Stopped 11/21/20 1042)    ED Course  I have reviewed the triage vital signs and the nursing notes.  Pertinent labs & imaging results that were available during my care of the patient were reviewed by me and considered in my medical decision making (see chart for details).    MDM Rules/Calculators/A&P                          Patient with dizziness and mild elevated blood pressure mild elevated glucose.  Patient has improved now and she will follow-up with her PCP.  CT head negative Final Clinical Impression(s) / ED Diagnoses Final diagnoses:  None    Rx / DC Orders ED Discharge Orders    None       Milton Ferguson, MD 11/21/20 1310

## 2020-11-21 NOTE — ED Notes (Signed)
Pt taken home via [facility]

## 2020-11-21 NOTE — Discharge Instructions (Addendum)
Drink plenty of fluids.  Rest this weekend.  Follow-up with your family doctor next week for recheck

## 2020-11-26 ENCOUNTER — Telehealth: Payer: Self-pay

## 2020-11-26 NOTE — Telephone Encounter (Signed)
Pt scheduled with Dr Livia Snellen for ER follow up 11/28/20 at 10:10.

## 2020-11-28 ENCOUNTER — Ambulatory Visit (INDEPENDENT_AMBULATORY_CARE_PROVIDER_SITE_OTHER): Payer: BC Managed Care – PPO | Admitting: Family Medicine

## 2020-11-28 ENCOUNTER — Encounter: Payer: Self-pay | Admitting: Family Medicine

## 2020-11-28 ENCOUNTER — Other Ambulatory Visit: Payer: Self-pay

## 2020-11-28 VITALS — BP 137/88 | HR 60 | Temp 98.0°F | Ht 66.0 in | Wt 173.6 lb

## 2020-11-28 DIAGNOSIS — E1165 Type 2 diabetes mellitus with hyperglycemia: Secondary | ICD-10-CM | POA: Diagnosis not present

## 2020-11-28 DIAGNOSIS — K2100 Gastro-esophageal reflux disease with esophagitis, without bleeding: Secondary | ICD-10-CM | POA: Diagnosis not present

## 2020-11-28 DIAGNOSIS — IMO0002 Reserved for concepts with insufficient information to code with codable children: Secondary | ICD-10-CM

## 2020-11-28 MED ORDER — VALSARTAN-HYDROCHLOROTHIAZIDE 160-25 MG PO TABS: 1.0000 | ORAL_TABLET | Freq: Every day | ORAL | 1 refills | Status: DC

## 2020-11-28 MED ORDER — DAPAGLIFLOZIN PROPANEDIOL 10 MG PO TABS: 10.0000 mg | ORAL_TABLET | Freq: Every day | ORAL | 3 refills | Status: DC

## 2020-11-28 NOTE — Progress Notes (Signed)
Subjective:  Patient ID: Renee [name], female    DOB: November 17, 1951  Age: 69 y.o. MRN: [id number]  CC: ER FOLLOW UP (HTN & ELEVATED GLUCOSE)   HPI Renee [name] presents for presents forFollow-up of diabetes.Off diet, ate 1/2 of a bag of potato chips in one dayon Thanksgiving. Patient denies symptoms such as polyuria, polydipsia, excessive hunger, nausea No significant hypoglycemic spells noted. Medications reviewed. Pt reports taking them regularly without complication/adverse reaction being reported today.  She is having burning in her feet. Using gabapentin with partial improvement.  Patient in for follow-up of GERD. Currently asymptomatic taking  PPI daily. There is no chest pain or heartburn. No hematemesis and no melena. No dysphagia or choking. Onset is remote. Progression is stable. Complicating factors, none.    Depression screen [facility] 2/9 11/28/2020 10/16/2020 07/21/2020  Decreased Interest 0 0 0  Down, Depressed, Hopeless 0 0 0  PHQ - 2 Score 0 0 0    History Renee [name] has a past medical history of Arthritis, Breast cancer ([place]) (2001), CVA (cerebral infarction) (2003, 2005), DM (diabetes mellitus) ([place]), GERD (gastroesophageal reflux disease), Heart murmur, Hyperlipidemia, Hypertension, Seizures ([place]), and Vitamin D deficiency.   She has a past surgical history that includes Mastectomy (2001); Abdominal hysterectomy (2000); Ganglion cyst excision (2010); and Tonsilectomy/adenoidectomy with myringotomy (Bilateral).   Her family history includes Alzheimer's disease in her mother; Breast cancer in her sister; Cancer in her paternal aunt; Diabetes in her brother; Early death in her brother; Heart Problems in her father; Heart disease in her father; Kidney disease in her father; Other in her sister; Parkinson's disease in her mother; Renal cancer in her sister.She reports that she has never smoked. She has never used smokeless tobacco. She reports previous alcohol use of about 1.0  standard drink of alcohol per week. She reports that she does not use drugs.    ROS Review of Systems  Constitutional: Negative.   HENT: Negative.   Eyes: Negative for visual disturbance.  Respiratory: Negative for shortness of breath.   Cardiovascular: Negative for chest pain.  Gastrointestinal: Negative for abdominal pain.  Musculoskeletal: Negative for arthralgias.  Allergic/Immunologic: Negative for environmental allergies.    Objective:  BP 137/88   Pulse 60   Temp 98 F (36.7 C) (Temporal)   Ht '5\' 6"'  (1.676 m)   Wt 173 lb 9.6 oz (78.7 kg)   BMI 28.02 kg/m   BP Readings from Last 3 Encounters:  11/28/20 137/88  11/21/20 (!) 144/78  10/16/20 127/69    Wt Readings from Last 3 Encounters:  11/28/20 173 lb 9.6 oz (78.7 kg)  11/21/20 167 lb (75.8 kg)  10/16/20 174 lb 6.4 oz (79.1 kg)     Physical Exam Constitutional:      General: She is not in acute distress.    Appearance: She is well-developed.  HENT:     Head: Normocephalic and atraumatic.  Eyes:     Conjunctiva/sclera: Conjunctivae normal.     Pupils: Pupils are equal, round, and reactive to light.  Neck:     Thyroid: No thyromegaly.  Cardiovascular:     Rate and Rhythm: Normal rate and regular rhythm.     Heart sounds: Normal heart sounds. No murmur heard.   Pulmonary:     Effort: Pulmonary effort is normal. No respiratory distress.     Breath sounds: Normal breath sounds. No wheezing or rales.  Abdominal:     General: Bowel sounds are normal. There is no distension.  Palpations: Abdomen is soft.     Tenderness: There is no abdominal tenderness.  Musculoskeletal:        General: Normal range of motion.     Cervical back: Normal range of motion and neck supple.  Lymphadenopathy:     Cervical: No cervical adenopathy.  Skin:    General: Skin is warm and dry.  Neurological:     Mental Status: She is alert and oriented to person, place, and time.  Psychiatric:        Behavior: Behavior  normal.        Thought Content: Thought content normal.        Judgment: Judgment normal.       Assessment & Plan:   Renee [name] was seen today for er follow up.  Diagnoses and all orders for this visit:  Uncontrolled type 2 diabetes mellitus, without long-term current use of insulin (HCC)  Gastroesophageal reflux disease with esophagitis, unspecified whether hemorrhage  Other orders -     dapagliflozin propanediol (FARXIGA) 10 MG TABS tablet; Take 1 tablet (10 mg total) by mouth daily before breakfast. -     valsartan-hydrochlorothiazide (DIOVAN-HCT) 160-25 MG tablet; Take 1 tablet by mouth daily.       I am having Renee [name]. Joe start on dapagliflozin propanediol and valsartan-hydrochlorothiazide. I am also having her maintain her aspirin, docusate sodium, multivitamin with minerals, OneTouch Verio, OneTouch Delica Lancets 88F, Vitamin D (Ergocalciferol), sitaGLIPtin, omeprazole, ibuprofen, baclofen, glipiZIDE, OneTouch Verio, atorvastatin, bisoprolol-hydrochlorothiazide, and OneTouch [employer].  Allergies as of 11/28/2020      Reactions   Metformin And Related Other (See Comments)   seizures   Penicillins       Medication List       Accurate as of November 28, 2020 11:59 PM. If you have any questions, ask your nurse or doctor.        aspirin 81 MG tablet Take 81 mg by mouth daily.   atorvastatin 40 MG tablet Commonly known as: LIPITOR Take 1 tablet by mouth once daily   baclofen 10 MG tablet Commonly known as: LIORESAL Take 1 tablet (10 mg total) by mouth 3 (three) times daily.   bisoprolol-hydrochlorothiazide 2.5-6.25 MG tablet Commonly known as: ZIAC Take 1 tablet by mouth daily.   dapagliflozin propanediol 10 MG Tabs tablet Commonly known as: Farxiga Take 1 tablet (10 mg total) by mouth daily before breakfast. Started by: Claretta Fraise, MD   docusate sodium 100 MG capsule Commonly known as: COLACE Take 100 mg by mouth daily.   gabapentin 400  MG capsule Commonly known as: NEURONTIN Take 1 capsule (400 mg total) by mouth 3 (three) times daily.   glipiZIDE 10 MG tablet Commonly known as: GLUCOTROL Take 1 tablet (10 mg total) by mouth 2 (two) times daily before a meal.   ibuprofen 200 MG tablet Commonly known as: ADVIL Take 200 mg by mouth as needed for mild pain or moderate pain.   multivitamin with minerals Tabs tablet Take 1 tablet by mouth daily.   omeprazole 20 MG capsule Commonly known as: PRILOSEC Take 1 capsule (20 mg total) by mouth daily.   OneTouch Delica Lancets 02D Misc Use to check BG up to bid.  Dx: E11.65 type 2 DM with insulin therapy, uncontrolled   OneTouch Verio Flex System w/Device Kit Use to check blood sugar twice daily   OneTouch Verio Soln Use as needed to calibrate glucometer   OneTouch Verio test strip Generic drug: glucose blood TEST GLUCOSE TWICE  DAILY Dx E11.95   sitaGLIPtin 100 MG tablet Commonly known as: JANUVIA Take 100 mg by mouth daily.   valsartan-hydrochlorothiazide 160-25 MG tablet Commonly known as: DIOVAN-HCT Take 1 tablet by mouth daily. Started by: Claretta Fraise, MD   Vitamin D (Ergocalciferol) 1.25 MG (50000 UNIT) Caps capsule Commonly known as: DRISDOL Take 1 capsule (50,000 Units total) by mouth every 7 (seven) days. What changed: when to take this        Follow-up: No follow-ups on file.  Claretta Fraise, M.D.

## 2020-12-02 ENCOUNTER — Encounter: Payer: Self-pay | Admitting: Family Medicine

## 2020-12-02 ENCOUNTER — Other Ambulatory Visit: Payer: Self-pay | Admitting: Family

## 2020-12-02 DIAGNOSIS — E1142 Type 2 diabetes mellitus with diabetic polyneuropathy: Secondary | ICD-10-CM

## 2021-01-10 ENCOUNTER — Other Ambulatory Visit: Payer: Self-pay | Admitting: Family

## 2021-01-10 DIAGNOSIS — K2100 Gastro-esophageal reflux disease with esophagitis, without bleeding: Secondary | ICD-10-CM

## 2021-01-16 ENCOUNTER — Ambulatory Visit: Payer: BC Managed Care – PPO | Admitting: Family

## 2021-01-29 DIAGNOSIS — M79675 Pain in left toe(s): Secondary | ICD-10-CM | POA: Diagnosis not present

## 2021-01-29 DIAGNOSIS — B351 Tinea unguium: Secondary | ICD-10-CM | POA: Diagnosis not present

## 2021-01-29 DIAGNOSIS — E1151 Type 2 diabetes mellitus with diabetic peripheral angiopathy without gangrene: Secondary | ICD-10-CM | POA: Diagnosis not present

## 2021-01-29 DIAGNOSIS — L84 Corns and callosities: Secondary | ICD-10-CM | POA: Diagnosis not present

## 2021-02-02 ENCOUNTER — Other Ambulatory Visit: Payer: Self-pay | Admitting: Family

## 2021-02-05 ENCOUNTER — Encounter: Payer: Self-pay | Admitting: Family

## 2021-02-05 ENCOUNTER — Ambulatory Visit (INDEPENDENT_AMBULATORY_CARE_PROVIDER_SITE_OTHER): Payer: Medicare Other | Admitting: Family

## 2021-02-05 ENCOUNTER — Other Ambulatory Visit: Payer: Self-pay

## 2021-02-05 VITALS — BP 154/78 | HR 85 | Temp 97.4°F | Ht 66.0 in | Wt 172.0 lb

## 2021-02-05 DIAGNOSIS — E663 Overweight: Secondary | ICD-10-CM | POA: Diagnosis not present

## 2021-02-05 DIAGNOSIS — E1169 Type 2 diabetes mellitus with other specified complication: Secondary | ICD-10-CM | POA: Diagnosis not present

## 2021-02-05 DIAGNOSIS — K5900 Constipation, unspecified: Secondary | ICD-10-CM | POA: Diagnosis not present

## 2021-02-05 DIAGNOSIS — I152 Hypertension secondary to endocrine disorders: Secondary | ICD-10-CM

## 2021-02-05 DIAGNOSIS — Z8673 Personal history of transient ischemic attack (TIA), and cerebral infarction without residual deficits: Secondary | ICD-10-CM | POA: Diagnosis not present

## 2021-02-05 DIAGNOSIS — E1165 Type 2 diabetes mellitus with hyperglycemia: Secondary | ICD-10-CM

## 2021-02-05 DIAGNOSIS — E785 Hyperlipidemia, unspecified: Secondary | ICD-10-CM

## 2021-02-05 DIAGNOSIS — IMO0002 Reserved for concepts with insufficient information to code with codable children: Secondary | ICD-10-CM

## 2021-02-05 DIAGNOSIS — E1142 Type 2 diabetes mellitus with diabetic polyneuropathy: Secondary | ICD-10-CM

## 2021-02-05 DIAGNOSIS — E1159 Type 2 diabetes mellitus with other circulatory complications: Secondary | ICD-10-CM

## 2021-02-05 DIAGNOSIS — K2100 Gastro-esophageal reflux disease with esophagitis, without bleeding: Secondary | ICD-10-CM | POA: Diagnosis not present

## 2021-02-05 LAB — BAYER DCA HB A1C WAIVED: HB A1C (BAYER DCA - WAIVED): 11.1 % — ABNORMAL HIGH (ref <7.0)

## 2021-02-05 MED ORDER — RYBELSUS 3 MG PO TABS: 3.0000 mg | ORAL_TABLET | Freq: Every day | ORAL | 1 refills | Status: DC

## 2021-02-05 NOTE — Progress Notes (Signed)
Subjective:    Patient ID: Renee [name], female    DOB: [birth year], 70 y.o.   MRN: [id number]  Chief Complaint  Patient presents with  . Diabetes  . Hypertension   Pt calls the office today for follow up. Her DM is uncontrolled and is followed by Podiatry every 8 weeks. She has seen Endocrinologists, but states she has not seen them in awhile because of transportation.   Diabetes She presents for her follow-up diabetic visit. She has type 2 diabetes mellitus. Her disease course has been worsening. There are no hypoglycemic associated symptoms. Associated symptoms include foot paresthesias. Pertinent negatives for diabetes include no blurred vision. There are no hypoglycemic complications. Symptoms are stable. Diabetic complications include a CVA, nephropathy and peripheral neuropathy. Risk factors for coronary artery disease include dyslipidemia, diabetes mellitus, hypertension, post-menopausal and sedentary lifestyle. She is following a generally unhealthy diet. Her overall blood glucose range is >200 mg/dl. An ACE inhibitor/angiotensin II receptor blocker is being taken. Eye exam is current.  Hypertension This is a chronic problem. The current episode started more than 1 year ago. The problem has been waxing and waning since onset. The problem is uncontrolled. Pertinent negatives include no blurred vision, peripheral edema or shortness of breath. Risk factors for coronary artery disease include diabetes mellitus, sedentary lifestyle and dyslipidemia. The current treatment provides mild improvement. Hypertensive end-organ damage includes CVA.  Hyperlipidemia This is a chronic problem. The current episode started more than 1 year ago. Exacerbating diseases include obesity. Pertinent negatives include no shortness of breath. Current antihyperlipidemic treatment includes statins. The current treatment provides mild improvement of lipids. Risk factors for coronary artery disease include dyslipidemia,  diabetes mellitus, hypertension, a sedentary lifestyle and post-menopausal.  Gastroesophageal Reflux She complains of belching and heartburn. This is a chronic problem. The current episode started more than 1 year ago. The problem occurs occasionally. The problem has been waxing and waning. Risk factors include obesity. She has tried a PPI for the symptoms. The treatment provided moderate relief.      Review of Systems  Eyes: Negative for blurred vision.  Respiratory: Negative for shortness of breath.   Gastrointestinal: Positive for heartburn.  All other systems reviewed and are negative.      Objective:   Physical Exam Vitals reviewed.  Constitutional:      General: She is not in acute distress.    Appearance: She is well-developed and well-nourished.  HENT:     Head: Normocephalic and atraumatic.     Right Ear: Tympanic membrane normal.     Left Ear: Tympanic membrane normal.     Mouth/Throat:     Mouth: Oropharynx is clear and moist.  Eyes:     Pupils: Pupils are equal, round, and reactive to light.  Neck:     Thyroid: No thyromegaly.  Cardiovascular:     Rate and Rhythm: Normal rate and regular rhythm.     Pulses: Intact distal pulses.     Heart sounds: Normal heart sounds. No murmur heard.   Pulmonary:     Effort: Pulmonary effort is normal. No respiratory distress.     Breath sounds: Normal breath sounds. No wheezing.  Abdominal:     General: Bowel sounds are normal. There is no distension.     Palpations: Abdomen is soft.     Tenderness: There is no abdominal tenderness.  Musculoskeletal:        General: No tenderness or edema. Normal range of motion.     Cervical  back: Normal range of motion and neck supple.  Skin:    General: Skin is warm and dry.  Neurological:     Mental Status: She is alert and oriented to person, place, and time.     Cranial Nerves: No cranial nerve deficit.     Deep Tendon Reflexes: Reflexes are normal and symmetric.  Psychiatric:         Mood and Affect: Mood and affect normal.        Behavior: Behavior normal.        Thought Content: Thought content normal.        Judgment: Judgment normal.     Diabetic Foot Exam - Simple   Simple Foot Form Diabetic Foot exam was performed with the following findings: Yes 02/05/2021 11:08 AM  Visual Inspection No deformities, no ulcerations, no other skin breakdown bilaterally: Yes Sensation Testing Intact to touch and monofilament testing bilaterally: Yes Pulse Check Posterior Tibialis and Dorsalis pulse intact bilaterally: Yes Comments Toenails thick      BP (!) 154/78   Pulse 85   Temp (!) 97.4 F (36.3 C) (Temporal)   Ht '5\' 6"'  (1.676 m)   Wt 172 lb (78 kg)   BMI 27.76 kg/m      Assessment & Plan:  Renee [name] comes in today with chief complaint of Diabetes and Hypertension   Diagnosis and orders addressed:  1. Hypertension associated with diabetes ([place]) - CMP14+EGFR - CBC with Differential/Platelet  2. Gastroesophageal reflux disease with esophagitis, unspecified whether hemorrhage - CMP14+EGFR - CBC with Differential/Platelet  3. Uncontrolled type 2 diabetes mellitus, without long-term current use of insulin (HCC) Will stop Januvia and start Rysbelsus 3 mg - CMP14+EGFR - CBC with Differential/Platelet - Bayer DCA Hb A1c Waived - Microalbumin / creatinine urine ratio - Semaglutide (RYBELSUS) 3 MG TABS; Take 3 mg by mouth daily.  Dispense: 30 tablet; Refill: 1  4. Hyperlipidemia associated with type 2 diabetes mellitus (HCC) - CMP14+EGFR - CBC with Differential/Platelet  5. Diabetic peripheral neuropathy (HCC) - CMP14+EGFR - CBC with Differential/Platelet  6. Overweight (BMI 25.0-29.9) - CMP14+EGFR - CBC with Differential/Platelet  7. History of CVA (cerebrovascular accident) - CMP14+EGFR - CBC with Differential/Platelet  8. Constipation, unspecified constipation type - CMP14+EGFR - CBC with Differential/Platelet   Labs  pending Health Maintenance reviewed Diet and exercise encouraged  Follow up plan:  1 month    Evelina Dun, FNP

## 2021-02-05 NOTE — Patient Instructions (Signed)
Diabetes Mellitus and Nutrition, Adult When you have diabetes, or diabetes mellitus, it is very important to have healthy eating habits because your blood sugar (glucose) levels are greatly affected by what you eat and drink. Eating healthy foods in the right amounts, at about the same times every day, can help you:  Control your blood glucose.  Lower your risk of heart disease.  Improve your blood pressure.  Reach or maintain a healthy weight. What can affect my meal plan? Every person with diabetes is different, and each person has different needs for a meal plan. Your health care provider may recommend that you work with a dietitian to make a meal plan that is best for you. Your meal plan may vary depending on factors such as:  The calories you need.  The medicines you take.  Your weight.  Your blood glucose, blood pressure, and cholesterol levels.  Your activity level.  Other health conditions you have, such as heart or kidney disease. How do carbohydrates affect me? Carbohydrates, also called carbs, affect your blood glucose level more than any other type of food. Eating carbs naturally raises the amount of glucose in your blood. Carb counting is a method for keeping track of how many carbs you eat. Counting carbs is important to keep your blood glucose at a healthy level, especially if you use insulin or take certain oral diabetes medicines. It is important to know how many carbs you can safely have in each meal. This is different for every person. Your dietitian can help you calculate how many carbs you should have at each meal and for each snack. How does alcohol affect me? Alcohol can cause a sudden decrease in blood glucose (hypoglycemia), especially if you use insulin or take certain oral diabetes medicines. Hypoglycemia can be a life-threatening condition. Symptoms of hypoglycemia, such as sleepiness, dizziness, and confusion, are similar to symptoms of having too much  alcohol.  Do not drink alcohol if: ? Your health care provider tells you not to drink. ? You are pregnant, may be pregnant, or are planning to become pregnant.  If you drink alcohol: ? Do not drink on an empty stomach. ? Limit how much you use to:  0-1 drink a day for women.  0-2 drinks a day for men. ? Be aware of how much alcohol is in your drink. In the [place] one drink equals one 12 oz bottle of beer (355 mL), one 5 oz glass of wine (148 mL), or one 1 oz glass of hard liquor (44 mL). ? Keep yourself hydrated with water, diet soda, or unsweetened iced tea.  Keep in mind that regular soda, juice, and other mixers may contain a lot of sugar and must be counted as carbs. What are tips for following this plan? Reading food labels  Start by checking the serving size on the "Nutrition Facts" label of packaged foods and drinks. The amount of calories, carbs, fats, and other nutrients listed on the label is based on one serving of the item. Many items contain more than one serving per package.  Check the total grams (g) of carbs in one serving. You can calculate the number of servings of carbs in one serving by dividing the total carbs by 15. For example, if a food has 30 g of total carbs per serving, it would be equal to 2 servings of carbs.  Check the number of grams (g) of saturated fats and trans fats in one serving. Choose foods that have   a low amount or none of these fats.  Check the number of milligrams (mg) of salt (sodium) in one serving. Most people should limit total sodium intake to less than 2,300 mg per day.  Always check the nutrition information of foods labeled as "low-fat" or "nonfat." These foods may be higher in added sugar or refined carbs and should be avoided.  Talk to your dietitian to identify your daily goals for nutrients listed on the label. Shopping  Avoid buying canned, pre-made, or processed foods. These foods tend to be high in fat, sodium, and added  sugar.  Shop around the outside edge of the grocery store. This is where you will most often find fresh fruits and vegetables, bulk grains, fresh meats, and fresh dairy. Cooking  Use low-heat cooking methods, such as baking, instead of high-heat cooking methods like deep frying.  Cook using healthy oils, such as olive, canola, or sunflower oil.  Avoid cooking with butter, cream, or high-fat meats. Meal planning  Eat meals and snacks regularly, preferably at the same times every day. Avoid going long periods of time without eating.  Eat foods that are high in fiber, such as fresh fruits, vegetables, beans, and whole grains. Talk with your dietitian about how many servings of carbs you can eat at each meal.  Eat 4-6 oz (112-168 g) of lean protein each day, such as lean meat, chicken, fish, eggs, or tofu. One ounce (oz) of lean protein is equal to: ? 1 oz (28 g) of meat, chicken, or fish. ? 1 egg. ?  cup (62 g) of tofu.  Eat some foods each day that contain healthy fats, such as avocado, nuts, seeds, and fish.   What foods should I eat? Fruits Berries. Apples. Oranges. Peaches. Apricots. Plums. Grapes. Mango. Papaya. Pomegranate. Kiwi. Cherries. Vegetables Lettuce. Spinach. Leafy greens, including kale, chard, collard greens, and mustard greens. Beets. Cauliflower. Cabbage. Broccoli. Carrots. Green beans. Tomatoes. Peppers. Onions. Cucumbers. Brussels sprouts. Grains Whole grains, such as whole-wheat or whole-grain bread, crackers, tortillas, cereal, and pasta. Unsweetened oatmeal. Quinoa. Brown or wild rice. Meats and other proteins Seafood. Poultry without skin. Lean cuts of poultry and beef. Tofu. Nuts. Seeds. Dairy Low-fat or fat-free dairy products such as milk, yogurt, and cheese. The items listed above may not be a complete list of foods and beverages you can eat. Contact a dietitian for more information. What foods should I avoid? Fruits Fruits canned with  syrup. Vegetables Canned vegetables. Frozen vegetables with butter or cream sauce. Grains Refined white flour and flour products such as bread, pasta, snack foods, and cereals. Avoid all processed foods. Meats and other proteins Fatty cuts of meat. Poultry with skin. Breaded or fried meats. Processed meat. Avoid saturated fats. Dairy Full-fat yogurt, cheese, or milk. Beverages Sweetened drinks, such as soda or iced tea. The items listed above may not be a complete list of foods and beverages you should avoid. Contact a dietitian for more information. Questions to ask a health care provider  Do I need to meet with a diabetes educator?  Do I need to meet with a dietitian?  What number can I call if I have questions?  When are the best times to check my blood glucose? Where to find more information:  American Diabetes Association: diabetes.org  Academy of Nutrition and Dietetics: www.eatright.org  National Institute of Diabetes and Digestive and Kidney Diseases: www.niddk.nih.gov  Association of Diabetes Care and Education Specialists: www.diabeteseducator.org Summary  It is important to have healthy eating   habits because your blood sugar (glucose) levels are greatly affected by what you eat and drink.  A healthy meal plan will help you control your blood glucose and maintain a healthy lifestyle.  Your health care provider may recommend that you work with a dietitian to make a meal plan that is best for you.  Keep in mind that carbohydrates (carbs) and alcohol have immediate effects on your blood glucose levels. It is important to count carbs and to use alcohol carefully. This information is not intended to replace advice given to you by your health care provider. Make sure you discuss any questions you have with your health care provider. Document Revised: 11/20/2019 Document Reviewed: 11/20/2019 Elsevier Patient Education  2021 Elsevier Inc.  

## 2021-02-06 LAB — CBC WITH DIFFERENTIAL/PLATELET
Basophils Absolute: 0.1 10*3/uL (ref 0.0–0.2)
Basos: 1 %
EOS (ABSOLUTE): 0.1 10*3/uL (ref 0.0–0.4)
Eos: 1 %
Hematocrit: 40.9 % (ref 34.0–46.6)
Hemoglobin: 13.3 g/dL (ref 11.1–15.9)
Immature Grans (Abs): 0 10*3/uL (ref 0.0–0.1)
Immature Granulocytes: 0 %
Lymphocytes Absolute: 1.9 10*3/uL (ref 0.7–3.1)
Lymphs: 28 %
MCH: 28.1 pg (ref 26.6–33.0)
MCHC: 32.5 g/dL (ref 31.5–35.7)
MCV: 86 fL (ref 79–97)
Monocytes Absolute: 0.5 10*3/uL (ref 0.1–0.9)
Monocytes: 7 %
Neutrophils Absolute: 4.4 10*3/uL (ref 1.4–7.0)
Neutrophils: 63 %
Platelets: 345 10*3/uL (ref 150–450)
RBC: 4.74 x10E6/uL (ref 3.77–5.28)
RDW: 12.3 % (ref 11.7–15.4)
WBC: 7 10*3/uL (ref 3.4–10.8)

## 2021-02-06 LAB — CMP14+EGFR
ALT: 24 IU/L (ref 0–32)
AST: 26 IU/L (ref 0–40)
Albumin/Globulin Ratio: 1.3 (ref 1.2–2.2)
Albumin: 4.4 g/dL (ref 3.8–4.8)
Alkaline Phosphatase: 116 IU/L (ref 44–121)
BUN/Creatinine Ratio: 22 (ref 12–28)
BUN: 30 mg/dL — ABNORMAL HIGH (ref 8–27)
Bilirubin Total: 0.3 mg/dL (ref 0.0–1.2)
CO2: 24 mmol/L (ref 20–29)
Calcium: 10 mg/dL (ref 8.7–10.3)
Chloride: 101 mmol/L (ref 96–106)
Creatinine, Ser: 1.35 mg/dL — ABNORMAL HIGH (ref 0.57–1.00)
GFR calc Af Amer: 46 mL/min/{1.73_m2} — ABNORMAL LOW (ref >59)
GFR calc non Af Amer: 40 mL/min/{1.73_m2} — ABNORMAL LOW (ref >59)
Globulin, Total: 3.3 g/dL (ref 1.5–4.5)
Glucose: 188 mg/dL — ABNORMAL HIGH (ref 65–99)
Potassium: 4.3 mmol/L (ref 3.5–5.2)
Sodium: 141 mmol/L (ref 134–144)
Total Protein: 7.7 g/dL (ref 6.0–8.5)

## 2021-02-06 LAB — MICROALBUMIN / CREATININE URINE RATIO
Creatinine, Urine: 76.6 mg/dL
Microalb/Creat Ratio: 7 mg/g creat (ref 0–29)
Microalbumin, Urine: 5.5 ug/mL

## 2021-02-09 ENCOUNTER — Telehealth: Payer: Self-pay

## 2021-02-09 NOTE — Telephone Encounter (Signed)
Called and spoke with patient and she states she is very scared to go on a new medication. She is mad that [place] was stopped and started on new one. It was explained to patient that her A1c has been going up and not down. She does not want to go on new medication. She told pharmacy no to fill it.

## 2021-02-10 NOTE — Telephone Encounter (Signed)
Baldomero Lamy, LPN spoke with patient and states that she was agreeable and understood medication changes.

## 2021-02-20 ENCOUNTER — Other Ambulatory Visit: Payer: Self-pay | Admitting: Family

## 2021-02-23 DIAGNOSIS — H40033 Anatomical narrow angle, bilateral: Secondary | ICD-10-CM | POA: Diagnosis not present

## 2021-02-23 DIAGNOSIS — E119 Type 2 diabetes mellitus without complications: Secondary | ICD-10-CM | POA: Diagnosis not present

## 2021-02-23 LAB — HM DIABETES EYE EXAM

## 2021-02-24 ENCOUNTER — Ambulatory Visit (INDEPENDENT_AMBULATORY_CARE_PROVIDER_SITE_OTHER): Payer: Medicare Other | Admitting: Family

## 2021-02-24 ENCOUNTER — Encounter: Payer: Self-pay | Admitting: Family

## 2021-02-24 ENCOUNTER — Other Ambulatory Visit: Payer: Self-pay

## 2021-02-24 VITALS — BP 109/59 | HR 75 | Temp 97.8°F | Ht 66.0 in | Wt 170.2 lb

## 2021-02-24 DIAGNOSIS — L0291 Cutaneous abscess, unspecified: Secondary | ICD-10-CM | POA: Diagnosis not present

## 2021-02-24 MED ORDER — DOXYCYCLINE HYCLATE 100 MG PO TABS: 100.0000 mg | ORAL_TABLET | Freq: Two times a day (BID) | ORAL | 0 refills | Status: DC

## 2021-02-24 NOTE — Progress Notes (Signed)
   Subjective:    Patient ID: Renee [name], female    DOB: 1951/11/21, 70 y.o.   MRN: [id number]  Chief Complaint  Patient presents with  . Cyst    On bottom painful     HPI PT presents to the office with an abscess in between her buttocks that started last month, but has worsened. She reports it is draining a yellow bloody discharge.   She reports her pain is a 10 out 10. She has not put anything on it, but tried not sitting on it as much.    Review of Systems  All other systems reviewed and are negative.      Objective:   Physical Exam Vitals reviewed.  Constitutional:      General: She is not in acute distress.    Appearance: She is well-developed and well-nourished.  HENT:     Head: Normocephalic and atraumatic.     Mouth/Throat:     Mouth: Oropharynx is clear and moist.  Eyes:     Pupils: Pupils are equal, round, and reactive to light.  Neck:     Thyroid: No thyromegaly.  Cardiovascular:     Rate and Rhythm: Normal rate and regular rhythm.     Pulses: Intact distal pulses.     Heart sounds: Normal heart sounds. No murmur heard.   Pulmonary:     Effort: Pulmonary effort is normal. No respiratory distress.     Breath sounds: Normal breath sounds. No wheezing.  Abdominal:     General: Bowel sounds are normal. There is no distension.     Palpations: Abdomen is soft.     Tenderness: There is no abdominal tenderness.  Musculoskeletal:        General: No tenderness or edema. Normal range of motion.     Cervical back: Normal range of motion and neck supple.  Skin:    General: Skin is warm and dry.          Comments: Hard abscess on right buttocks draining thick white bloddy discharge.   Neurological:     Mental Status: She is alert and oriented to person, place, and time.     Cranial Nerves: No cranial nerve deficit.     Deep Tendon Reflexes: Reflexes are normal and symmetric.  Psychiatric:        Mood and Affect: Mood and affect normal.        Behavior:  Behavior normal.        Thought Content: Thought content normal.        Judgment: Judgment normal.       BP (!) 109/59   Pulse 75   Temp 97.8 F (36.6 C) (Temporal)   Ht 5\' 6"  (1.676 m)   Wt 170 lb 3.2 oz (77.2 kg)   BMI 27.47 kg/m      Assessment & Plan:  Renee [name] comes in today with chief complaint of Cyst (On bottom painful )   Diagnosis and orders addressed:  1. Abscess Warm compresses Soak in tub  Report increased pain, swelling, or fever.  Start doxycycline today - doxycycline (VIBRA-TABS) 100 MG tablet; Take 1 tablet (100 mg total) by mouth 2 (two) times daily.  Dispense: 20 tablet; Refill: 0   Evelina Dun, FNP

## 2021-02-24 NOTE — Patient Instructions (Signed)
Skin Abscess  A skin abscess is an infected area on or under your skin that contains a collection of pus and other material. An abscess may also be called a furuncle, carbuncle, or boil. An abscess can occur in or on almost any part of your body. Some abscesses break open (rupture) on their own. Most continue to get worse unless they are treated. The infection can spread deeper into the body and eventually into your blood, which can make you feel ill. Treatment usually involves draining the abscess. What are the causes? An abscess occurs when germs, like bacteria, pass through your skin and cause an infection. This may be caused by:  A scrape or cut on your skin.  A puncture wound through your skin, including a needle injection or insect bite.  Blocked oil or sweat glands.  Blocked and infected hair follicles.  A cyst that forms beneath your skin (sebaceous cyst) and becomes infected. What increases the risk? This condition is more likely to develop in people who:  Have a weak body defense system (immune system).  Have diabetes.  Have dry and irritated skin.  Get frequent injections or use illegal IV drugs.  Have a foreign body in a wound, such as a splinter.  Have problems with their lymph system or veins. What are the signs or symptoms? Symptoms of this condition include:  A painful, firm bump under the skin.  A bump with pus at the top. This may break through the skin and drain. Other symptoms include:  Redness surrounding the abscess site.  Warmth.  Swelling of the lymph nodes (glands) near the abscess.  Tenderness.  A sore on the skin. How is this diagnosed? This condition may be diagnosed based on:  A physical exam.  Your medical history.  A sample of pus. This may be used to find out what is causing the infection.  Blood tests.  Imaging tests, such as an ultrasound, CT scan, or MRI. How is this treated? A small abscess that drains on its own may not  need treatment. Treatment for larger abscesses may include:  Moist heat or heat pack applied to the area several times a day.  A procedure to drain the abscess (incision and drainage).  Antibiotic medicines. For a severe abscess, you may first get antibiotics through an IV and then change to antibiotics by mouth. Follow these instructions at home: Medicines  Take over-the-counter and prescription medicines only as told by your health care provider.  If you were prescribed an antibiotic medicine, take it as told by your health care provider. Do not stop taking the antibiotic even if you start to feel better.   Abscess care  If you have an abscess that has not drained, apply heat to the affected area. Use the heat source that your health care provider recommends, such as a moist heat pack or a heating pad. ? Place a towel between your skin and the heat source. ? Leave the heat on for 20-30 minutes. ? Remove the heat if your skin turns bright red. This is especially important if you are unable to feel pain, heat, or cold. You may have a greater risk of getting burned.  Follow instructions from your health care provider about how to take care of your abscess. Make sure you: ? Cover the abscess with a bandage (dressing). ? Change your dressing or gauze as told by your health care provider. ? Wash your hands with soap and water before you change the  dressing or gauze. If soap and water are not available, use hand sanitizer.  Check your abscess every day for signs of a worsening infection. Check for: ? More redness, swelling, or pain. ? More fluid or blood. ? Warmth. ? More pus or a bad smell.   General instructions  To avoid spreading the infection: ? Do not share personal care items, towels, or hot tubs with others. ? Avoid making skin contact with other people.  Keep all follow-up visits as told by your health care provider. This is important. Contact a health care provider if you  have:  More redness, swelling, or pain around your abscess.  More fluid or blood coming from your abscess.  Warm skin around your abscess.  More pus or a bad smell coming from your abscess.  A fever.  Muscle aches.  Chills or a general ill feeling. Get help right away if you:  Have severe pain.  See red streaks on your skin spreading away from the abscess. Summary  A skin abscess is an infected area on or under your skin that contains a collection of pus and other material.  A small abscess that drains on its own may not need treatment.  Treatment for larger abscesses may include having a procedure to drain the abscess and taking an antibiotic. This information is not intended to replace advice given to you by your health care provider. Make sure you discuss any questions you have with your health care provider. Document Revised: 04/05/2019 Document Reviewed: 01/26/2018 Elsevier Patient Education  2021 [employer].

## 2021-03-05 ENCOUNTER — Other Ambulatory Visit: Payer: Self-pay

## 2021-03-05 ENCOUNTER — Ambulatory Visit (INDEPENDENT_AMBULATORY_CARE_PROVIDER_SITE_OTHER): Payer: Medicare Other | Admitting: Family

## 2021-03-05 ENCOUNTER — Encounter: Payer: Self-pay | Admitting: Family

## 2021-03-05 VITALS — BP 120/65 | HR 78 | Temp 97.0°F | Ht 66.0 in | Wt 169.0 lb

## 2021-03-05 DIAGNOSIS — E1159 Type 2 diabetes mellitus with other circulatory complications: Secondary | ICD-10-CM | POA: Diagnosis not present

## 2021-03-05 DIAGNOSIS — I152 Hypertension secondary to endocrine disorders: Secondary | ICD-10-CM | POA: Diagnosis not present

## 2021-03-05 DIAGNOSIS — E1165 Type 2 diabetes mellitus with hyperglycemia: Secondary | ICD-10-CM

## 2021-03-05 DIAGNOSIS — IMO0002 Reserved for concepts with insufficient information to code with codable children: Secondary | ICD-10-CM

## 2021-03-05 MED ORDER — SITAGLIPTIN PHOSPHATE 50 MG PO TABS: ORAL_TABLET | ORAL | 0 refills | Status: DC

## 2021-03-05 MED ORDER — SITAGLIPTIN PHOSPHATE 100 MG PO TABS: 100.0000 mg | ORAL_TABLET | Freq: Every day | ORAL | 2 refills | Status: DC

## 2021-03-05 NOTE — Patient Instructions (Signed)
Diabetes Mellitus and Nutrition, Adult When you have diabetes, or diabetes mellitus, it is very important to have healthy eating habits because your blood sugar (glucose) levels are greatly affected by what you eat and drink. Eating healthy foods in the right amounts, at about the same times every day, can help you:  Control your blood glucose.  Lower your risk of heart disease.  Improve your blood pressure.  Reach or maintain a healthy weight. What can affect my meal plan? Every person with diabetes is different, and each person has different needs for a meal plan. Your health care provider may recommend that you work with a dietitian to make a meal plan that is best for you. Your meal plan may vary depending on factors such as:  The calories you need.  The medicines you take.  Your weight.  Your blood glucose, blood pressure, and cholesterol levels.  Your activity level.  Other health conditions you have, such as heart or kidney disease. How do carbohydrates affect me? Carbohydrates, also called carbs, affect your blood glucose level more than any other type of food. Eating carbs naturally raises the amount of glucose in your blood. Carb counting is a method for keeping track of how many carbs you eat. Counting carbs is important to keep your blood glucose at a healthy level, especially if you use insulin or take certain oral diabetes medicines. It is important to know how many carbs you can safely have in each meal. This is different for every person. Your dietitian can help you calculate how many carbs you should have at each meal and for each snack. How does alcohol affect me? Alcohol can cause a sudden decrease in blood glucose (hypoglycemia), especially if you use insulin or take certain oral diabetes medicines. Hypoglycemia can be a life-threatening condition. Symptoms of hypoglycemia, such as sleepiness, dizziness, and confusion, are similar to symptoms of having too much  alcohol.  Do not drink alcohol if: ? Your health care provider tells you not to drink. ? You are pregnant, may be pregnant, or are planning to become pregnant.  If you drink alcohol: ? Do not drink on an empty stomach. ? Limit how much you use to:  0-1 drink a day for women.  0-2 drinks a day for men. ? Be aware of how much alcohol is in your drink. In the [place] one drink equals one 12 oz bottle of beer (355 mL), one 5 oz glass of wine (148 mL), or one 1 oz glass of hard liquor (44 mL). ? Keep yourself hydrated with water, diet soda, or unsweetened iced tea.  Keep in mind that regular soda, juice, and other mixers may contain a lot of sugar and must be counted as carbs. What are tips for following this plan? Reading food labels  Start by checking the serving size on the "Nutrition Facts" label of packaged foods and drinks. The amount of calories, carbs, fats, and other nutrients listed on the label is based on one serving of the item. Many items contain more than one serving per package.  Check the total grams (g) of carbs in one serving. You can calculate the number of servings of carbs in one serving by dividing the total carbs by 15. For example, if a food has 30 g of total carbs per serving, it would be equal to 2 servings of carbs.  Check the number of grams (g) of saturated fats and trans fats in one serving. Choose foods that have   a low amount or none of these fats.  Check the number of milligrams (mg) of salt (sodium) in one serving. Most people should limit total sodium intake to less than 2,300 mg per day.  Always check the nutrition information of foods labeled as "low-fat" or "nonfat." These foods may be higher in added sugar or refined carbs and should be avoided.  Talk to your dietitian to identify your daily goals for nutrients listed on the label. Shopping  Avoid buying canned, pre-made, or processed foods. These foods tend to be high in fat, sodium, and added  sugar.  Shop around the outside edge of the grocery store. This is where you will most often find fresh fruits and vegetables, bulk grains, fresh meats, and fresh dairy. Cooking  Use low-heat cooking methods, such as baking, instead of high-heat cooking methods like deep frying.  Cook using healthy oils, such as olive, canola, or sunflower oil.  Avoid cooking with butter, cream, or high-fat meats. Meal planning  Eat meals and snacks regularly, preferably at the same times every day. Avoid going long periods of time without eating.  Eat foods that are high in fiber, such as fresh fruits, vegetables, beans, and whole grains. Talk with your dietitian about how many servings of carbs you can eat at each meal.  Eat 4-6 oz (112-168 g) of lean protein each day, such as lean meat, chicken, fish, eggs, or tofu. One ounce (oz) of lean protein is equal to: ? 1 oz (28 g) of meat, chicken, or fish. ? 1 egg. ?  cup (62 g) of tofu.  Eat some foods each day that contain healthy fats, such as avocado, nuts, seeds, and fish.   What foods should I eat? Fruits Berries. Apples. Oranges. Peaches. Apricots. Plums. Grapes. Mango. Papaya. Pomegranate. Kiwi. Cherries. Vegetables Lettuce. Spinach. Leafy greens, including kale, chard, collard greens, and mustard greens. Beets. Cauliflower. Cabbage. Broccoli. Carrots. Green beans. Tomatoes. Peppers. Onions. Cucumbers. Brussels sprouts. Grains Whole grains, such as whole-wheat or whole-grain bread, crackers, tortillas, cereal, and pasta. Unsweetened oatmeal. Quinoa. Brown or wild rice. Meats and other proteins Seafood. Poultry without skin. Lean cuts of poultry and beef. Tofu. Nuts. Seeds. Dairy Low-fat or fat-free dairy products such as milk, yogurt, and cheese. The items listed above may not be a complete list of foods and beverages you can eat. Contact a dietitian for more information. What foods should I avoid? Fruits Fruits canned with  syrup. Vegetables Canned vegetables. Frozen vegetables with butter or cream sauce. Grains Refined white flour and flour products such as bread, pasta, snack foods, and cereals. Avoid all processed foods. Meats and other proteins Fatty cuts of meat. Poultry with skin. Breaded or fried meats. Processed meat. Avoid saturated fats. Dairy Full-fat yogurt, cheese, or milk. Beverages Sweetened drinks, such as soda or iced tea. The items listed above may not be a complete list of foods and beverages you should avoid. Contact a dietitian for more information. Questions to ask a health care provider  Do I need to meet with a diabetes educator?  Do I need to meet with a dietitian?  What number can I call if I have questions?  When are the best times to check my blood glucose? Where to find more information:  American Diabetes Association: diabetes.org  Academy of Nutrition and Dietetics: www.eatright.org  National Institute of Diabetes and Digestive and Kidney Diseases: www.niddk.nih.gov  Association of Diabetes Care and Education Specialists: www.diabeteseducator.org Summary  It is important to have healthy eating   habits because your blood sugar (glucose) levels are greatly affected by what you eat and drink.  A healthy meal plan will help you control your blood glucose and maintain a healthy lifestyle.  Your health care provider may recommend that you work with a dietitian to make a meal plan that is best for you.  Keep in mind that carbohydrates (carbs) and alcohol have immediate effects on your blood glucose levels. It is important to count carbs and to use alcohol carefully. This information is not intended to replace advice given to you by your health care provider. Make sure you discuss any questions you have with your health care provider. Document Revised: 11/20/2019 Document Reviewed: 11/20/2019 Elsevier Patient Education  2021 Elsevier Inc.  

## 2021-03-05 NOTE — Progress Notes (Signed)
Subjective:    Patient ID: Renee [name], female    DOB: [birth year], 70 y.o.   MRN: [id number] Chief Complaint  Patient presents with  . Hypertension  . Diabetes    New med makes her too sick she can not take     Pt presents to the office today to recheck DM. She states she tried to take the Rybelsus, but made her sick.  Hypertension This is a chronic problem. The current episode started more than 1 year ago. The problem has been resolved since onset. The problem is controlled. Pertinent negatives include no blurred vision, peripheral edema or shortness of breath. Risk factors for coronary artery disease include dyslipidemia, obesity and sedentary lifestyle. The current treatment provides moderate improvement.  Diabetes She presents for her follow-up diabetic visit. She has type 2 diabetes mellitus. Her disease course has been stable. Associated symptoms include foot paresthesias. Pertinent negatives for diabetes include no blurred vision. Symptoms are stable. Diabetic complications include nephropathy and peripheral neuropathy. Risk factors for coronary artery disease include dyslipidemia, diabetes mellitus, hypertension, sedentary lifestyle and post-menopausal. She is following a generally unhealthy diet. Her overall blood glucose range is 110-130 mg/dl. An ACE inhibitor/angiotensin II receptor blocker is being taken.      Review of Systems  Eyes: Negative for blurred vision.  Respiratory: Negative for shortness of breath.   All other systems reviewed and are negative.      Objective:   Physical Exam Vitals reviewed.  Constitutional:      General: She is not in acute distress.    Appearance: She is well-developed.  HENT:     Head: Normocephalic and atraumatic.     Right Ear: Tympanic membrane normal.     Left Ear: Tympanic membrane normal.  Eyes:     Pupils: Pupils are equal, round, and reactive to light.  Neck:     Thyroid: No thyromegaly.  Cardiovascular:     Rate and  Rhythm: Normal rate and regular rhythm.     Heart sounds: Normal heart sounds. No murmur heard.   Pulmonary:     Effort: Pulmonary effort is normal. No respiratory distress.     Breath sounds: Normal breath sounds. No wheezing.  Abdominal:     General: Bowel sounds are normal. There is no distension.     Palpations: Abdomen is soft.     Tenderness: There is no abdominal tenderness.  Musculoskeletal:        General: No tenderness. Normal range of motion.     Cervical back: Normal range of motion and neck supple.  Skin:    General: Skin is warm and dry.  Neurological:     Mental Status: She is alert and oriented to person, place, and time.     Cranial Nerves: No cranial nerve deficit.     Deep Tendon Reflexes: Reflexes are normal and symmetric.  Psychiatric:        Behavior: Behavior normal.        Thought Content: Thought content normal.        Judgment: Judgment normal.       BP 120/65   Pulse 78   Temp (!) 97 F (36.1 C) (Temporal)   Ht '5\' 6"'  (1.676 m)   Wt 169 lb (76.7 kg)   BMI 27.28 kg/m      Assessment & Plan:  Renee [name] comes in today with chief complaint of Hypertension and Diabetes (New med makes her too sick she can not take )  Diagnosis and orders addressed:  1. Uncontrolled type 2 diabetes mellitus, without long-term current use of insulin ([place]) Pt very insisted that she does not want change medications. We have agreed she will go back to Januvia since she has taken this in the past.  Strict low carb RTO in 1 month - sitaGLIPtin (JANUVIA) 50 MG tablet; Take 1 tablet (50 mg total) by mouth daily for 14 days, THEN 2 tablets (100 mg total) daily for 14 days.  Dispense: 42 tablet; Refill: 0 - sitaGLIPtin (JANUVIA) 100 MG tablet; Take 1 tablet (100 mg total) by mouth daily.  Dispense: 90 tablet; Refill: 2 - BMP8+EGFR  2. Hypertension associated with diabetes ([place]) - BMP8+EGFR   Labs pending Health Maintenance reviewed Diet and exercise  encouraged  Follow up plan: 1 month   Evelina Dun, FNP

## 2021-03-06 ENCOUNTER — Telehealth: Payer: Self-pay | Admitting: *Deleted

## 2021-03-06 LAB — BMP8+EGFR
BUN/Creatinine Ratio: 23 (ref 12–28)
BUN: 27 mg/dL (ref 8–27)
CO2: 21 mmol/L (ref 20–29)
Calcium: 10.2 mg/dL (ref 8.7–10.3)
Chloride: 100 mmol/L (ref 96–106)
Creatinine, Ser: 1.16 mg/dL — ABNORMAL HIGH (ref 0.57–1.00)
Glucose: 141 mg/dL — ABNORMAL HIGH (ref 65–99)
Potassium: 4.1 mmol/L (ref 3.5–5.2)
Sodium: 141 mmol/L (ref 134–144)
eGFR: 50 mL/min/{1.73_m2} — ABNORMAL LOW (ref >59)

## 2021-03-06 NOTE — Telephone Encounter (Signed)
Pt got call from [place] that her prescriptions for the Januvia are going to cost her $188 Is there something cheaper

## 2021-03-06 NOTE — Telephone Encounter (Signed)
Can we try to get her medications from [facility]?

## 2021-03-09 ENCOUNTER — Other Ambulatory Visit: Payer: Self-pay | Admitting: Family Medicine

## 2021-03-09 DIAGNOSIS — E1165 Type 2 diabetes mellitus with hyperglycemia: Secondary | ICD-10-CM

## 2021-03-09 DIAGNOSIS — E1159 Type 2 diabetes mellitus with other circulatory complications: Secondary | ICD-10-CM

## 2021-03-09 DIAGNOSIS — IMO0002 Reserved for concepts with insufficient information to code with codable children: Secondary | ICD-10-CM

## 2021-03-09 DIAGNOSIS — I152 Hypertension secondary to endocrine disorders: Secondary | ICD-10-CM

## 2021-03-09 MED ORDER — DAPAGLIFLOZIN PROPANEDIOL 10 MG PO TABS: 10.0000 mg | ORAL_TABLET | Freq: Every day | ORAL | 3 refills | Status: DC

## 2021-03-09 MED ORDER — SITAGLIPTIN PHOSPHATE 100 MG PO TABS: 100.0000 mg | ORAL_TABLET | Freq: Every day | ORAL | 2 refills | Status: DC

## 2021-03-09 MED ORDER — GLIPIZIDE 10 MG PO TABS: 10.0000 mg | ORAL_TABLET | Freq: Two times a day (BID) | ORAL | 3 refills | Status: DC

## 2021-03-09 NOTE — Telephone Encounter (Signed)
PT aware form faxed to [facility] pt assistance program and they will get in touch with her about how they can help her with getting her medications

## 2021-03-10 ENCOUNTER — Other Ambulatory Visit: Payer: Self-pay | Admitting: Family

## 2021-03-10 DIAGNOSIS — E1142 Type 2 diabetes mellitus with diabetic polyneuropathy: Secondary | ICD-10-CM

## 2021-03-16 ENCOUNTER — Telehealth: Payer: Self-pay

## 2021-03-16 DIAGNOSIS — E1159 Type 2 diabetes mellitus with other circulatory complications: Secondary | ICD-10-CM

## 2021-03-16 MED ORDER — GLIPIZIDE 10 MG PO TABS: 10.0000 mg | ORAL_TABLET | Freq: Two times a day (BID) | ORAL | 3 refills | Status: DC

## 2021-03-16 NOTE — Telephone Encounter (Signed)
Pt aware the glipizide was sent to [facility] patient assistance program along with the farxiga, she is needing some sent locally since she only has a couple left until she can get in touch with pt assistance Refills sent to [place]

## 2021-03-16 NOTE — Telephone Encounter (Signed)
  Prescription Request  03/16/2021  What is the name of the medication or equipment? glipiZIDE (GLUCOTROL) 10 MG tablet was printed instead of being sent into pharmacy  Have you contacted your pharmacy to request a refill? (if applicable) yes  Which pharmacy would you like this sent to? [facility]   Patient notified that their request is being sent to the clinical staff for review and that they should receive a response within 2 business days.

## 2021-03-19 ENCOUNTER — Other Ambulatory Visit: Payer: Self-pay | Admitting: Family

## 2021-03-19 DIAGNOSIS — Z1231 Encounter for screening mammogram for malignant neoplasm of breast: Secondary | ICD-10-CM

## 2021-03-25 ENCOUNTER — Other Ambulatory Visit: Payer: Self-pay

## 2021-03-25 ENCOUNTER — Ambulatory Visit
Admission: RE | Admit: 2021-03-25 | Discharge: 2021-03-25 | Disposition: A | Discharge status: Home / Self Care | Payer: Medicare Other | Source: Ambulatory Visit | Attending: Family | Admitting: Family

## 2021-03-25 ENCOUNTER — Other Ambulatory Visit: Payer: Self-pay | Admitting: Family

## 2021-03-25 DIAGNOSIS — Z1231 Encounter for screening mammogram for malignant neoplasm of breast: Secondary | ICD-10-CM | POA: Diagnosis not present

## 2021-04-09 DIAGNOSIS — E1151 Type 2 diabetes mellitus with diabetic peripheral angiopathy without gangrene: Secondary | ICD-10-CM | POA: Diagnosis not present

## 2021-04-09 DIAGNOSIS — M79676 Pain in unspecified toe(s): Secondary | ICD-10-CM | POA: Diagnosis not present

## 2021-04-09 DIAGNOSIS — L84 Corns and callosities: Secondary | ICD-10-CM | POA: Diagnosis not present

## 2021-04-09 DIAGNOSIS — B351 Tinea unguium: Secondary | ICD-10-CM | POA: Diagnosis not present

## 2021-04-23 ENCOUNTER — Telehealth: Payer: Self-pay | Admitting: Family Medicine

## 2021-04-29 ENCOUNTER — Other Ambulatory Visit: Payer: Self-pay | Admitting: Family

## 2021-04-29 DIAGNOSIS — K2100 Gastro-esophageal reflux disease with esophagitis, without bleeding: Secondary | ICD-10-CM

## 2021-05-05 ENCOUNTER — Ambulatory Visit (INDEPENDENT_AMBULATORY_CARE_PROVIDER_SITE_OTHER): Payer: Medicare Other | Admitting: Family

## 2021-05-05 ENCOUNTER — Encounter: Payer: Self-pay | Admitting: Family

## 2021-05-05 ENCOUNTER — Other Ambulatory Visit: Payer: Self-pay

## 2021-05-05 VITALS — BP 112/67 | HR 79 | Temp 97.3°F | Ht 66.0 in | Wt 167.0 lb

## 2021-05-05 DIAGNOSIS — E1165 Type 2 diabetes mellitus with hyperglycemia: Secondary | ICD-10-CM

## 2021-05-05 DIAGNOSIS — E1159 Type 2 diabetes mellitus with other circulatory complications: Secondary | ICD-10-CM

## 2021-05-05 DIAGNOSIS — K5900 Constipation, unspecified: Secondary | ICD-10-CM | POA: Diagnosis not present

## 2021-05-05 DIAGNOSIS — K2100 Gastro-esophageal reflux disease with esophagitis, without bleeding: Secondary | ICD-10-CM | POA: Diagnosis not present

## 2021-05-05 DIAGNOSIS — I152 Hypertension secondary to endocrine disorders: Secondary | ICD-10-CM

## 2021-05-05 DIAGNOSIS — E1169 Type 2 diabetes mellitus with other specified complication: Secondary | ICD-10-CM | POA: Diagnosis not present

## 2021-05-05 DIAGNOSIS — E663 Overweight: Secondary | ICD-10-CM | POA: Diagnosis not present

## 2021-05-05 DIAGNOSIS — IMO0002 Reserved for concepts with insufficient information to code with codable children: Secondary | ICD-10-CM

## 2021-05-05 DIAGNOSIS — E1142 Type 2 diabetes mellitus with diabetic polyneuropathy: Secondary | ICD-10-CM

## 2021-05-05 DIAGNOSIS — Z8673 Personal history of transient ischemic attack (TIA), and cerebral infarction without residual deficits: Secondary | ICD-10-CM

## 2021-05-05 DIAGNOSIS — E785 Hyperlipidemia, unspecified: Secondary | ICD-10-CM | POA: Diagnosis not present

## 2021-05-05 LAB — BAYER DCA HB A1C WAIVED: HB A1C (BAYER DCA - WAIVED): 10.9 % — ABNORMAL HIGH (ref <7.0)

## 2021-05-05 NOTE — Patient Instructions (Signed)
Diabetes Mellitus and Nutrition, Adult When you have diabetes, or diabetes mellitus, it is very important to have healthy eating habits because your blood sugar (glucose) levels are greatly affected by what you eat and drink. Eating healthy foods in the right amounts, at about the same times every day, can help you:  Control your blood glucose.  Lower your risk of heart disease.  Improve your blood pressure.  Reach or maintain a healthy weight. What can affect my meal plan? Every person with diabetes is different, and each person has different needs for a meal plan. Your health care provider may recommend that you work with a dietitian to make a meal plan that is best for you. Your meal plan may vary depending on factors such as:  The calories you need.  The medicines you take.  Your weight.  Your blood glucose, blood pressure, and cholesterol levels.  Your activity level.  Other health conditions you have, such as heart or kidney disease. How do carbohydrates affect me? Carbohydrates, also called carbs, affect your blood glucose level more than any other type of food. Eating carbs naturally raises the amount of glucose in your blood. Carb counting is a method for keeping track of how many carbs you eat. Counting carbs is important to keep your blood glucose at a healthy level, especially if you use insulin or take certain oral diabetes medicines. It is important to know how many carbs you can safely have in each meal. This is different for every person. Your dietitian can help you calculate how many carbs you should have at each meal and for each snack. How does alcohol affect me? Alcohol can cause a sudden decrease in blood glucose (hypoglycemia), especially if you use insulin or take certain oral diabetes medicines. Hypoglycemia can be a life-threatening condition. Symptoms of hypoglycemia, such as sleepiness, dizziness, and confusion, are similar to symptoms of having too much  alcohol.  Do not drink alcohol if: ? Your health care provider tells you not to drink. ? You are pregnant, may be pregnant, or are planning to become pregnant.  If you drink alcohol: ? Do not drink on an empty stomach. ? Limit how much you use to:  0-1 drink a day for women.  0-2 drinks a day for men. ? Be aware of how much alcohol is in your drink. In the [place] one drink equals one 12 oz bottle of beer (355 mL), one 5 oz glass of wine (148 mL), or one 1 oz glass of hard liquor (44 mL). ? Keep yourself hydrated with water, diet soda, or unsweetened iced tea.  Keep in mind that regular soda, juice, and other mixers may contain a lot of sugar and must be counted as carbs. What are tips for following this plan? Reading food labels  Start by checking the serving size on the "Nutrition Facts" label of packaged foods and drinks. The amount of calories, carbs, fats, and other nutrients listed on the label is based on one serving of the item. Many items contain more than one serving per package.  Check the total grams (g) of carbs in one serving. You can calculate the number of servings of carbs in one serving by dividing the total carbs by 15. For example, if a food has 30 g of total carbs per serving, it would be equal to 2 servings of carbs.  Check the number of grams (g) of saturated fats and trans fats in one serving. Choose foods that have   a low amount or none of these fats.  Check the number of milligrams (mg) of salt (sodium) in one serving. Most people should limit total sodium intake to less than 2,300 mg per day.  Always check the nutrition information of foods labeled as "low-fat" or "nonfat." These foods may be higher in added sugar or refined carbs and should be avoided.  Talk to your dietitian to identify your daily goals for nutrients listed on the label. Shopping  Avoid buying canned, pre-made, or processed foods. These foods tend to be high in fat, sodium, and added  sugar.  Shop around the outside edge of the grocery store. This is where you will most often find fresh fruits and vegetables, bulk grains, fresh meats, and fresh dairy. Cooking  Use low-heat cooking methods, such as baking, instead of high-heat cooking methods like deep frying.  Cook using healthy oils, such as olive, canola, or sunflower oil.  Avoid cooking with butter, cream, or high-fat meats. Meal planning  Eat meals and snacks regularly, preferably at the same times every day. Avoid going long periods of time without eating.  Eat foods that are high in fiber, such as fresh fruits, vegetables, beans, and whole grains. Talk with your dietitian about how many servings of carbs you can eat at each meal.  Eat 4-6 oz (112-168 g) of lean protein each day, such as lean meat, chicken, fish, eggs, or tofu. One ounce (oz) of lean protein is equal to: ? 1 oz (28 g) of meat, chicken, or fish. ? 1 egg. ?  cup (62 g) of tofu.  Eat some foods each day that contain healthy fats, such as avocado, nuts, seeds, and fish.   What foods should I eat? Fruits Berries. Apples. Oranges. Peaches. Apricots. Plums. Grapes. Mango. Papaya. Pomegranate. Kiwi. Cherries. Vegetables Lettuce. Spinach. Leafy greens, including kale, chard, collard greens, and mustard greens. Beets. Cauliflower. Cabbage. Broccoli. Carrots. Green beans. Tomatoes. Peppers. Onions. Cucumbers. Brussels sprouts. Grains Whole grains, such as whole-wheat or whole-grain bread, crackers, tortillas, cereal, and pasta. Unsweetened oatmeal. Quinoa. Brown or wild rice. Meats and other proteins Seafood. Poultry without skin. Lean cuts of poultry and beef. Tofu. Nuts. Seeds. Dairy Low-fat or fat-free dairy products such as milk, yogurt, and cheese. The items listed above may not be a complete list of foods and beverages you can eat. Contact a dietitian for more information. What foods should I avoid? Fruits Fruits canned with  syrup. Vegetables Canned vegetables. Frozen vegetables with butter or cream sauce. Grains Refined white flour and flour products such as bread, pasta, snack foods, and cereals. Avoid all processed foods. Meats and other proteins Fatty cuts of meat. Poultry with skin. Breaded or fried meats. Processed meat. Avoid saturated fats. Dairy Full-fat yogurt, cheese, or milk. Beverages Sweetened drinks, such as soda or iced tea. The items listed above may not be a complete list of foods and beverages you should avoid. Contact a dietitian for more information. Questions to ask a health care provider  Do I need to meet with a diabetes educator?  Do I need to meet with a dietitian?  What number can I call if I have questions?  When are the best times to check my blood glucose? Where to find more information:  American Diabetes Association: diabetes.org  Academy of Nutrition and Dietetics: www.eatright.org  National Institute of Diabetes and Digestive and Kidney Diseases: www.niddk.nih.gov  Association of Diabetes Care and Education Specialists: www.diabeteseducator.org Summary  It is important to have healthy eating   habits because your blood sugar (glucose) levels are greatly affected by what you eat and drink.  A healthy meal plan will help you control your blood glucose and maintain a healthy lifestyle.  Your health care provider may recommend that you work with a dietitian to make a meal plan that is best for you.  Keep in mind that carbohydrates (carbs) and alcohol have immediate effects on your blood glucose levels. It is important to count carbs and to use alcohol carefully. This information is not intended to replace advice given to you by your health care provider. Make sure you discuss any questions you have with your health care provider. Document Revised: 11/20/2019 Document Reviewed: 11/20/2019 Elsevier Patient Education  2021 Elsevier Inc.  

## 2021-05-05 NOTE — Progress Notes (Signed)
Subjective:    Patient ID: Renee [name], female    DOB: 19-Aug-1951, 70 y.o.   MRN: [id number]  Chief Complaint  Patient presents with  . Diabetes    Pt calls the office today for follow up. Her DM is uncontrolled and is followed by Podiatry every 8 weeks.She has seen Endocrinologists, but states she has not seen them in awhile because of transportation.    She reports she can not take metformin caused seizures and made her "out of it". Then states the rybelsus caused her vision changes. She reports she tried to get Januvia, but it was too expensive and could not afford it.  Diabetes She presents for her follow-up diabetic visit. She has type 2 diabetes mellitus. There are no hypoglycemic associated symptoms. Associated symptoms include foot paresthesias. Pertinent negatives for diabetes include no blurred vision. Symptoms are stable. Diabetic complications include heart disease. Risk factors for coronary artery disease include dyslipidemia, diabetes mellitus, hypertension, sedentary lifestyle and post-menopausal. Her overall blood glucose range is >200 mg/dl. An ACE inhibitor/angiotensin II receptor blocker is being taken. Eye exam is current.  Hypertension This is a chronic problem. The current episode started more than 1 year ago. The problem has been resolved since onset. The problem is controlled. Associated symptoms include malaise/fatigue. Pertinent negatives include no blurred vision, peripheral edema or shortness of breath. Risk factors for coronary artery disease include dyslipidemia, obesity and sedentary lifestyle. The current treatment provides moderate improvement.  Gastroesophageal Reflux She complains of belching and heartburn. This is a chronic problem. The current episode started more than 1 year ago. The problem occurs occasionally. The problem has been waxing and waning. She has tried a PPI for the symptoms. The treatment provided moderate relief.  Hyperlipidemia This is a  chronic problem. The current episode started more than 1 year ago. The problem is uncontrolled. Pertinent negatives include no shortness of breath. Current antihyperlipidemic treatment includes statins. The current treatment provides moderate improvement of lipids. Risk factors for coronary artery disease include dyslipidemia, diabetes mellitus, a sedentary lifestyle and post-menopausal.  Constipation This is a chronic problem. The current episode started more than 1 year ago. The problem has been waxing and waning since onset. She has tried laxatives for the symptoms. The treatment provided moderate relief.         Review of Systems  Constitutional: Positive for malaise/fatigue.  Eyes: Negative for blurred vision.  Respiratory: Negative for shortness of breath.   Gastrointestinal: Positive for constipation and heartburn.  All other systems reviewed and are negative.      Objective:   Physical Exam Vitals reviewed.  Constitutional:      General: She is not in acute distress.    Appearance: She is well-developed.  HENT:     Head: Normocephalic and atraumatic.     Right Ear: Tympanic membrane normal.     Left Ear: Tympanic membrane normal.  Eyes:     Pupils: Pupils are equal, round, and reactive to light.  Neck:     Thyroid: No thyromegaly.  Cardiovascular:     Rate and Rhythm: Normal rate and regular rhythm.     Heart sounds: Normal heart sounds. No murmur heard.   Pulmonary:     Effort: Pulmonary effort is normal. No respiratory distress.     Breath sounds: Normal breath sounds. No wheezing.  Abdominal:     General: Bowel sounds are normal. There is no distension.     Palpations: Abdomen is soft.  Tenderness: There is no abdominal tenderness.  Musculoskeletal:        General: No tenderness. Normal range of motion.     Cervical back: Normal range of motion and neck supple.  Skin:    General: Skin is warm and dry.  Neurological:     Mental Status: She is alert and  oriented to person, place, and time.     Cranial Nerves: No cranial nerve deficit.     Deep Tendon Reflexes: Reflexes are normal and symmetric.  Psychiatric:        Behavior: Behavior normal.        Thought Content: Thought content normal.        Judgment: Judgment normal.          BP 112/67   Pulse 79   Temp (!) 97.3 F (36.3 C) (Temporal)   Ht _0  (1.676 m)   Wt 167 lb (75.8 kg)   BMI 26.95 kg/m   Assessment & Plan:  Renee [name] comes in today with chief complaint of Diabetes   Diagnosis and orders addressed:  1. Hypertension associated with diabetes ([place]) - BMP8+EGFR  2. Gastroesophageal reflux disease with esophagitis, unspecified whether hemorrhage - BMP8+EGFR  3. Uncontrolled type 2 diabetes mellitus, without long-term current use of insulin ([facility]) - Bayer DCA Hb A1c Waived - BMP8+EGFR  4. Hyperlipidemia associated with type 2 diabetes mellitus ([facility]CC) - BMP8+EGFR  5. Diabetic peripheral neuropathy ([facility]) - BMP8+EGFR  6. Overweight (BMI 25.0-29.9) - BMP8+EGFR  7. History of CVA (cerebrovascular accident) - BMP8+EGFR  8. Constipation, unspecified constipation type - BMP8+EGFR   Labs pending Health Maintenance reviewed Diet and exercise encouraged  Follow up plan: 2 months and needs appt with Almyra Free for diabetic education.    Evelina Dun, FNP

## 2021-05-06 ENCOUNTER — Other Ambulatory Visit: Payer: Self-pay | Admitting: Family

## 2021-05-06 LAB — BMP8+EGFR
BUN/Creatinine Ratio: 19 (ref 12–28)
BUN: 24 mg/dL (ref 8–27)
CO2: 25 mmol/L (ref 20–29)
Calcium: 10.6 mg/dL — ABNORMAL HIGH (ref 8.7–10.3)
Chloride: 101 mmol/L (ref 96–106)
Creatinine, Ser: 1.29 mg/dL — ABNORMAL HIGH (ref 0.57–1.00)
Glucose: 148 mg/dL — ABNORMAL HIGH (ref 65–99)
Potassium: 4.9 mmol/L (ref 3.5–5.2)
Sodium: 141 mmol/L (ref 134–144)
eGFR: 44 mL/min/{1.73_m2} — ABNORMAL LOW (ref >59)

## 2021-05-08 ENCOUNTER — Encounter (HOSPITAL_COMMUNITY): Payer: Self-pay | Admitting: *Deleted

## 2021-05-08 ENCOUNTER — Other Ambulatory Visit: Payer: Self-pay

## 2021-05-08 ENCOUNTER — Emergency Department (HOSPITAL_COMMUNITY)
Admission: EM | Admit: 2021-05-08 | Discharge: 2021-05-08 | Disposition: A | Discharge status: Home / Self Care | Payer: Medicare Other | Attending: Emergency Medicine | Admitting: Emergency Medicine

## 2021-05-08 DIAGNOSIS — E119 Type 2 diabetes mellitus without complications: Secondary | ICD-10-CM | POA: Insufficient documentation

## 2021-05-08 DIAGNOSIS — I10 Essential (primary) hypertension: Secondary | ICD-10-CM | POA: Diagnosis not present

## 2021-05-08 DIAGNOSIS — Z853 Personal history of malignant neoplasm of breast: Secondary | ICD-10-CM | POA: Insufficient documentation

## 2021-05-08 DIAGNOSIS — Z7984 Long term (current) use of oral hypoglycemic drugs: Secondary | ICD-10-CM | POA: Diagnosis not present

## 2021-05-08 DIAGNOSIS — K0889 Other specified disorders of teeth and supporting structures: Secondary | ICD-10-CM | POA: Insufficient documentation

## 2021-05-08 DIAGNOSIS — Z7982 Long term (current) use of aspirin: Secondary | ICD-10-CM | POA: Diagnosis not present

## 2021-05-08 DIAGNOSIS — Z79899 Other long term (current) drug therapy: Secondary | ICD-10-CM | POA: Diagnosis not present

## 2021-05-08 MED ORDER — CLINDAMYCIN HCL 150 MG PO CAPS
450.0000 mg | ORAL_CAPSULE | Freq: Once | ORAL | Status: AC
  Administered 2021-05-08: 450 mg via ORAL
  Filled 2021-05-08: qty 3

## 2021-05-08 MED ORDER — CLINDAMYCIN HCL 150 MG PO CAPS: 450.0000 mg | ORAL_CAPSULE | Freq: Three times a day (TID) | ORAL | 0 refills | Status: AC

## 2021-05-08 MED ORDER — HYDROCODONE-ACETAMINOPHEN 5-325 MG PO TABS
1.0000 | ORAL_TABLET | Freq: Once | ORAL | Status: AC
Start: 2021-05-08 — End: 2021-05-08
  Administered 2021-05-08: 1 via ORAL
  Filled 2021-05-08: qty 1

## 2021-05-08 NOTE — ED Triage Notes (Signed)
Dental pain x 3 days.  

## 2021-05-08 NOTE — ED Provider Notes (Signed)
Emergency Medicine Provider Triage Evaluation Note  Renee [name] , a 70 y.o. female  was evaluated in triage.  Pt complains of dental pain. States pain started on Tuesday during a meeting. The pain is worse to the right lower jaw. Pain has been constant, worse with eating. She reports previous dental filling to the tooth completed years ago. She has not had fevers, chills, difficulty breathing or difficulty managing fluids. No drainage or pus. No swelling to the jaw.   Review of Systems  Positive: Tooth pain Negative: Vomiting, nausea, fever, chills, dysphagia   Physical Exam  BP 117/70 (BP Location: Left Arm)   Pulse 73   Temp 98.4 F (36.9 C) (Oral)   Resp 18   SpO2 100%  Gen:   Awake, no distress   Resp:  Normal effort  MSK:   Moves extremities without difficulty  Other:  No fluctuance or swelling surrounding the tooth. Metal dental filling in situ.   Medical Decision Making  Medically screening exam initiated at 12:18 PM.  Appropriate orders placed.  Renee [name] was informed that the remainder of the evaluation will be completed by another provider, this initial triage assessment does not replace that evaluation, and the importance of remaining in the ED until their evaluation is complete.     Sherrill Raring, PA-C 05/08/21 1220    Elnora Morrison, MD 05/09/21 [phone]

## 2021-05-08 NOTE — ED Provider Notes (Signed)
[facility] EMERGENCY DEPARTMENT Provider Note   CSN: [id number] Arrival date & time: 05/08/21  1112     History Chief Complaint  Patient presents with  . Dental Pain    Renee [name] is a 70 y.o. female with PMH of HTN, HLD, and type II DM who presents to the ED with a 3-day history of right lower dental pain.  She states that she is hesitant to eat solid foods given the pain symptoms.  She called her dentist, but they recently changed to their insurance requirements.  The receptionist who she had spoken with had intended to call her back regarding scheduling of an appointment, but she did not hear from them yesterday which prompted her to come here to the ED for evaluation.  She reports that she has had dental pain in the past, but approximately 5 to 10 years ago.  She denies any recent fevers, but does endorse feeling cold at times.  Her vital signs here are entirely within normal limits.  She states that she has been checking her blood sugars at home regularly and this morning it was in the 160s.  Patient states that she has been using Orajel to treat her pain symptoms, with good relief.  However, the pain simply comes back the next day.    She denies any fevers, inability to open her mouth, inability to swallow, difficulty breathing, tongue swelling, facial swelling, headaches or dizziness, neck pain, or any other symptoms.  HPI     Past Medical History:  Diagnosis Date  . Arthritis   . Breast cancer ([place]) 2001   right mastectomy  . CVA (cerebral infarction) 2003, 2005  . DM (diabetes mellitus) ([place])   . GERD (gastroesophageal reflux disease)   . Heart murmur   . Hyperlipidemia   . Hypertension   . Seizures ([place])   . Vitamin D deficiency     Patient Active Problem List   Diagnosis Date Noted  . Occipital neuralgia of right side 10/16/2020  . Essential hypertension, benign 02/15/2019  . Mixed hyperlipidemia 02/15/2019  . Overweight (BMI 25.0-29.9) 01/03/2017  .  Constipation 01/03/2017  . Diabetic peripheral neuropathy ([place]) 09/30/2016  . Uncontrolled type 2 diabetes mellitus, without long-term current use of insulin ([place]) 07/05/2016  . Genetic testing 05/09/2016  . History of breast cancer in female 04/02/2016  . Family history of breast cancer in sister 04/02/2016  . GERD (gastroesophageal reflux disease) 02/05/2014  . Hyperlipidemia associated with type 2 diabetes mellitus ([place]) 02/05/2014  . Hypertension associated with diabetes ([place]) 02/05/2014  . History of CVA (cerebrovascular accident) 02/05/2014    Past Surgical History:  Procedure Laterality Date  . ABDOMINAL HYSTERECTOMY  2000  . GANGLION CYST EXCISION  2010   right (2010)  and left (before 2010) hand  . MASTECTOMY  2001   right  . TONSILECTOMY/ADENOIDECTOMY WITH MYRINGOTOMY Bilateral    age 12's     OB History   No obstetric history on file.     Family History  Problem Relation Age of Onset  . Alzheimer's disease Mother   . Parkinson's disease Mother   . Heart Problems Father   . Kidney disease Father        +dialysis  . Heart disease Father   . Other Sister        +TAH for fibroids  . Cancer Paternal Aunt        NOS cancer  . Breast cancer Sister  dx. 55-56  . Renal cancer Sister        dx. 62-63; not a smoker  . Early death Brother   . Diabetes Brother   . Colon cancer Neg Hx   . Stomach cancer Neg Hx     Social History   Tobacco Use  . Smoking status: Never Smoker  . Smokeless tobacco: Never Used  Vaping Use  . Vaping Use: Never used  Substance Use Topics  . Alcohol use: Not Currently    Alcohol/week: 1.0 standard drink    Types: 1 Glasses of wine per week  . Drug use: No    Home Medications Prior to Admission medications   Medication Sig Start Date End Date Taking? Authorizing Provider  clindamycin (CLEOCIN) 150 MG capsule Take 3 capsules (450 mg total) by mouth 3 (three) times daily for 7 days. 05/08/21 05/15/21 Yes Corena Herter,  PA-C  aspirin 81 MG tablet Take 81 mg by mouth daily.    [provider]  atorvastatin (LIPITOR) 40 MG tablet Take 1 tablet by mouth once daily 05/07/21   Evelina Dun A, FNP  Blood Glucose Calibration (ONETOUCH VERIO) SOLN Use as needed to calibrate glucometer 01/05/16   Cherre Robins, PharmD  Blood Glucose Monitoring Suppl ([place]) w/Device KIT Use to check blood sugar twice daily 11/12/20   Evelina Dun A, FNP  dapagliflozin propanediol (FARXIGA) 10 MG TABS tablet Take 1 tablet (10 mg total) by mouth daily before breakfast. 03/09/21   Evelina Dun A, FNP  docusate sodium (COLACE) 100 MG capsule Take 100 mg by mouth daily.    [provider]  gabapentin (NEURONTIN) 400 MG capsule TAKE 1 CAPSULE BY MOUTH THREE TIMES DAILY 03/10/21   Evelina Dun A, FNP  glipiZIDE (GLUCOTROL) 10 MG tablet Take 1 tablet (10 mg total) by mouth 2 (two) times daily before a meal. 03/16/21   Evelina Dun A, FNP  glucose blood (ONETOUCH VERIO) test strip TEST GLUCOSE TWICE DAILY Dx E11.95 10/27/20   Sharion Balloon, FNP  omeprazole (PRILOSEC) 20 MG capsule Take 1 capsule by mouth once daily 04/29/21   Sharion Balloon, FNP  [facility] DELICA LANCETS 41D MISC Use to check BG up to bid.  Dx: E11.65 type 2 DM with insulin therapy, uncontrolled 01/05/16   Eckard, Tammy, PharmD  sitaGLIPtin (JANUVIA) 100 MG tablet Take 1 tablet (100 mg total) by mouth daily. 03/09/21   Sharion Balloon, FNP  sitaGLIPtin (JANUVIA) 50 MG tablet Take 1 tablet (50 mg total) by mouth daily for 14 days, THEN 2 tablets (100 mg total) daily for 14 days. 03/05/21 04/02/21  Evelina Dun A, FNP  valsartan-hydrochlorothiazide (DIOVAN-HCT) 160-25 MG tablet Take 1 tablet by mouth daily. 11/28/20   Claretta Fraise, MD  Vitamin D, Ergocalciferol, (DRISDOL) 1.25 MG (50000 UT) CAPS capsule Take 1 capsule (50,000 Units total) by mouth every 7 (seven) days. Patient taking differently: Take 50,000 Units by mouth every 14 (fourteen)  days. 01/02/19   Sharion Balloon, FNP    Allergies    Metformin and related and Penicillins  Review of Systems   Review of Systems  All other systems reviewed and are negative.   Physical Exam Updated Vital Signs BP 118/69 (BP Location: Left Arm)   Pulse 73   Temp 98.3 F (36.8 C) (Oral)   Resp 18   SpO2 100%   Physical Exam Vitals and nursing note reviewed. Exam conducted with a chaperone present.  Constitutional:  Appearance: Normal appearance.  HENT:     Head: Normocephalic and atraumatic.     Mouth/Throat:     Pharynx: Oropharynx is clear.     Comments: Poor dentition noted throughout.  Tenderness involving #28 tooth as well as mild tenderness involving #30 tooth which has previously been filled.  Suspect mild odontogenic infection.  No swelling.  No trismus.  Tolerating secretions well.  No asymmetries.  Uvula is midline.  No overlying mandibular region erythema or tenderness. Eyes:     General: No scleral icterus.    Conjunctiva/sclera: Conjunctivae normal.  Cardiovascular:     Rate and Rhythm: Normal rate.     Pulses: Normal pulses.  Pulmonary:     Effort: Pulmonary effort is normal. No respiratory distress.     Breath sounds: No wheezing or rales.     Comments: No increased work of breathing. Musculoskeletal:     Cervical back: Normal range of motion. No rigidity.  Skin:    General: Skin is dry.  Neurological:     Mental Status: She is alert.     GCS: GCS eye subscore is 4. GCS verbal subscore is 5. GCS motor subscore is 6.  Psychiatric:        Mood and Affect: Mood normal.        Behavior: Behavior normal.        Thought Content: Thought content normal.       ED Results / Procedures / Treatments   Labs (all labs ordered are listed, but only abnormal results are displayed) Labs Reviewed - No data to display  EKG None  Radiology No results found.  Procedures Procedures   Medications Ordered in ED Medications  HYDROcodone-acetaminophen  (NORCO/VICODIN) 5-325 MG per tablet 1 tablet (has no administration in time range)  clindamycin (CLEOCIN) capsule 450 mg (has no administration in time range)    ED Course  I have reviewed the triage vital signs and the nursing notes.  Pertinent labs & imaging results that were available during my care of the patient were reviewed by me and considered in my medical decision making (see chart for details).    MDM Rules/Calculators/A&P                          TAMERA PINGLEY was evaluated in Emergency Department on 05/08/2021 for the symptoms described in the history of present illness. She was evaluated in the context of the global COVID-19 pandemic, which necessitated consideration that the patient might be at risk for infection with the SARS-CoV-2 virus that causes COVID-19. Institutional protocols and algorithms that pertain to the evaluation of patients at risk for COVID-19 are in a state of rapid change based on information released by regulatory bodies including the CDC and federal and state organizations. These policies and algorithms were followed during the patient's care in the ED.  I personally reviewed patient's medical chart and all notes from triage and staff during today's encounter. I have also ordered and reviewed all labs and imaging that I felt to be medically necessary in the evaluation of this patient's complaints and with consideration of their physical exam. If needed, translation services were available and utilized.   Patient presents to the ED with dental pain, but without any abscess amenable to drainage.  She does not have any right-sided mandibular swelling, warmth, redness, or other overlying skin changes.  She denies any fevers or chills, difficulty swallowing or breathing, or other symptoms.  Patient  able to speak in complete sentences without difficulty swallowing or trouble handling secretions.  No stridor, wheezing, tripoding, grey pseudomembrane, trismus, uvular  deviation, sublingual/submandibular/submental swelling or induration, nuchal rigidity, or neck pain.  Low suspicion for deep tissue infection such as mandibular or maxillary abscess, PTA or RPA, epiglottitis, or other emergent pathology.   We will provide patient with first dose of clindamycin here in the ED as well as a Vicodin.  Would have preferred Augmentin given her age, but she maintains that she has a penicillin allergy.  Will discharge her home with continued clindamycin antibiotics and provide patient with dental resources.  However, I am hoping that she can have follow-up arranged with her current dentist.  Will prescribe clindamycin 450 mg 3 times daily x7 days.  Encouraging close outpatient follow-up with her dentist.  She has a ride home.  Cautioned on effects of Vicodin.  Also cautioned her on clindamycin risk of colostrum difficile.  ER return precautions discussed.  Patient voices understanding and is agreeable to the plan.  Final Clinical Impression(s) / ED Diagnoses Final diagnoses:  Pain, dental    Rx / DC Orders ED Discharge Orders         Ordered    clindamycin (CLEOCIN) 150 MG capsule  3 times daily        05/08/21 1415           Reita Chard 05/08/21 1417    Elnora Morrison, MD 05/09/21 [phone]

## 2021-05-08 NOTE — Discharge Instructions (Signed)
You have a dental infection.  You are treated here in the ED with Vicodin which is a narcotic.   Do not drive. Do not use machinery or power tools. Do not sign legal documents. Do not drink alcohol. Do not take sleeping pills. Do not supervise children by yourself. Do not participate in activities that require climbing or being in high places.  You have been prescribed clindamycin, an antibiotic.  Please monitor for evidence of diarrhea.  Be sure to replace any fluids lost with loose stools with increased oral hydration.  Please follow-up with your primary care provider about today's ED encounter.  However, you will need to be seen by dentist for definitive intervention.  The treatment provided here in the ED is only temporary.  You will likely require dental extraction.  I recommend soft diet until your symptoms are improved.  Continue to check your blood sugars regularly.  Return to the ER seek immediate medical attention if you experience any new or worsening symptoms.

## 2021-05-19 ENCOUNTER — Encounter: Payer: Self-pay | Admitting: Pharmacist

## 2021-05-19 ENCOUNTER — Other Ambulatory Visit: Payer: Self-pay

## 2021-05-19 ENCOUNTER — Ambulatory Visit (INDEPENDENT_AMBULATORY_CARE_PROVIDER_SITE_OTHER): Payer: Medicare Other | Admitting: Pharmacist

## 2021-05-19 DIAGNOSIS — E1165 Type 2 diabetes mellitus with hyperglycemia: Secondary | ICD-10-CM

## 2021-05-19 DIAGNOSIS — IMO0002 Reserved for concepts with insufficient information to code with codable children: Secondary | ICD-10-CM

## 2021-05-19 MED ORDER — DAPAGLIFLOZIN PROPANEDIOL 10 MG PO TABS: 10.0000 mg | ORAL_TABLET | Freq: Every day | ORAL | 3 refills | Status: DC

## 2021-05-19 NOTE — Progress Notes (Signed)
    05/19/2021 Name: Renee [name] MRN: [id number] DOB: 10-21-1951   S:  70 yoF Presents for diabetes evaluation, education, and management Patient was referred and last seen by Primary Care Provider on 03/09/21.  Insurance coverage/medication affordability: BCBS  Patient reports adherence with medications. . Current diabetes medications include: glipizide, farxiga . Current hypertension medications include: valsartan.hctz Goal 130/80 . Current hyperlipidemia medications include: atorvastatin  Patient denies hypoglycemic events.   Patient reported dietary habits: Eats 3 meals/day The patient is asked to make an attempt to improve diet and exercise patterns to aid in medical management of this problem. Discussed meal planning options and Plate method for healthy eating . Avoid sugary drinks and desserts . Incorporate balanced protein, non starchy veggies, 1 serving of carbohydrate with each meal . Increase water intake . Increase physical activity as able  Patient-reported exercise habits: wants to walk in park,walks in stores  Patient reports nocturia (nighttime urination).   Patient reports neuropathy (nerve pain). On gabapentin  Patient reports visual changes.  Patient reports self foot exams. Sees dr. Irving Shows annually     O:  Lab Results  Component Value Date   HGBA1C 10.9 (H) 05/05/2021   Lipid Panel     Component Value Date/Time   CHOL 146 11/05/2019 1236   TRIG 87 11/05/2019 1236   HDL 47 11/05/2019 1236   CHOLHDL 3.1 11/05/2019 1236   LDLCALC 82 11/05/2019 1236    Home fasting blood sugars: 125  2 hour post-meal/random blood sugars: n/a.   Clinical Atherosclerotic Cardiovascular Disease (ASCVD): No   The 10-year ASCVD risk score Mikey Bussing DC Jr., et al., 2013) is: 18.2%   Values used to calculate the score:     Age: 70 years     Sex: Female     Is Non-Hispanic African American: Yes     Diabetic: Yes     Tobacco smoker: No     Systolic Blood Pressure:  118 mmHg     Is BP treated: Yes     HDL Cholesterol: 47 mg/dL     Total Cholesterol: 146 mg/dL    A/P:  Diabetes T2DM currently uncontrolled. Patient is adherent with medication. Control is suboptimal due to cost.  -CONTINUE Farxiga 10mg  daily  Counseled Sick day rules: if sick, vomiting, have diarrhea, or  cannot drink enough fluids, you should stop taking SGLT-2 inhibitors until your symptoms go away. In rare cases, these medicines can cause diabetic ketoacidosis (DKA). DKA is acid buildup in the blood.  Patient [place] patient [place] for farxiga for 7322 Application filled out online Medication will ship to patient's home RX escribed to [facility] with 3 refills   -RESTART JANUVIA 50MG  DAILY (renally adjusted)  WILL HAVE TO GO THROUGH PATIENT ASSISTANCE PROGRAM WITH [facility] TO OBTAIN; COST IS CHALLENGING; will ship to PCP office  -CONTINUE GLIPIZIDE  -Extensively discussed pathophysiology of diabetes, recommended lifestyle interventions, dietary effects on blood sugar control  -Counseled on s/sx of and management of hypoglycemia  -Next A1C anticipated 07/06/21  Written patient instructions provided.  Total time in face to face counseling 30 minutes.   Follow up PCP Clinic Visit ON 07/06/21.    Regina Eck, PharmD, BCPS Clinical Pharmacist, [place]  II Phone [phone]

## 2021-05-23 ENCOUNTER — Other Ambulatory Visit: Payer: Self-pay | Admitting: Family Medicine

## 2021-06-09 ENCOUNTER — Other Ambulatory Visit: Payer: Self-pay | Admitting: Family

## 2021-06-09 DIAGNOSIS — E1142 Type 2 diabetes mellitus with diabetic polyneuropathy: Secondary | ICD-10-CM

## 2021-06-11 DIAGNOSIS — B351 Tinea unguium: Secondary | ICD-10-CM | POA: Diagnosis not present

## 2021-06-11 DIAGNOSIS — E1151 Type 2 diabetes mellitus with diabetic peripheral angiopathy without gangrene: Secondary | ICD-10-CM | POA: Diagnosis not present

## 2021-06-11 DIAGNOSIS — L84 Corns and callosities: Secondary | ICD-10-CM | POA: Diagnosis not present

## 2021-06-11 DIAGNOSIS — M79676 Pain in unspecified toe(s): Secondary | ICD-10-CM | POA: Diagnosis not present

## 2021-07-06 ENCOUNTER — Ambulatory Visit (INDEPENDENT_AMBULATORY_CARE_PROVIDER_SITE_OTHER): Payer: Medicare Other | Admitting: Family

## 2021-07-06 ENCOUNTER — Encounter: Payer: Self-pay | Admitting: Family

## 2021-07-06 ENCOUNTER — Other Ambulatory Visit: Payer: Self-pay

## 2021-07-06 VITALS — BP 115/65 | HR 68 | Temp 97.3°F | Ht 66.0 in | Wt 167.0 lb

## 2021-07-06 DIAGNOSIS — E1169 Type 2 diabetes mellitus with other specified complication: Secondary | ICD-10-CM | POA: Diagnosis not present

## 2021-07-06 DIAGNOSIS — IMO0002 Reserved for concepts with insufficient information to code with codable children: Secondary | ICD-10-CM

## 2021-07-06 DIAGNOSIS — E1159 Type 2 diabetes mellitus with other circulatory complications: Secondary | ICD-10-CM | POA: Diagnosis not present

## 2021-07-06 DIAGNOSIS — Z8673 Personal history of transient ischemic attack (TIA), and cerebral infarction without residual deficits: Secondary | ICD-10-CM

## 2021-07-06 DIAGNOSIS — I152 Hypertension secondary to endocrine disorders: Secondary | ICD-10-CM | POA: Diagnosis not present

## 2021-07-06 DIAGNOSIS — E1165 Type 2 diabetes mellitus with hyperglycemia: Secondary | ICD-10-CM

## 2021-07-06 DIAGNOSIS — K2100 Gastro-esophageal reflux disease with esophagitis, without bleeding: Secondary | ICD-10-CM

## 2021-07-06 DIAGNOSIS — E785 Hyperlipidemia, unspecified: Secondary | ICD-10-CM

## 2021-07-06 DIAGNOSIS — E1142 Type 2 diabetes mellitus with diabetic polyneuropathy: Secondary | ICD-10-CM

## 2021-07-06 DIAGNOSIS — E663 Overweight: Secondary | ICD-10-CM | POA: Diagnosis not present

## 2021-07-06 LAB — BAYER DCA HB A1C WAIVED: HB A1C (BAYER DCA - WAIVED): 10.3 % — ABNORMAL HIGH (ref <7.0)

## 2021-07-06 NOTE — Patient Instructions (Signed)
Diabetes Mellitus and Nutrition, Adult When you have diabetes, or diabetes mellitus, it is very important to have healthy eating habits because your blood sugar (glucose) levels are greatly affected by what you eat and drink. Eating healthy foods in the right amounts, at about the same times every day, can help you:  Control your blood glucose.  Lower your risk of heart disease.  Improve your blood pressure.  Reach or maintain a healthy weight. What can affect my meal plan? Every person with diabetes is different, and each person has different needs for a meal plan. Your health care provider may recommend that you work with a dietitian to make a meal plan that is best for you. Your meal plan may vary depending on factors such as:  The calories you need.  The medicines you take.  Your weight.  Your blood glucose, blood pressure, and cholesterol levels.  Your activity level.  Other health conditions you have, such as heart or kidney disease. How do carbohydrates affect me? Carbohydrates, also called carbs, affect your blood glucose level more than any other type of food. Eating carbs naturally raises the amount of glucose in your blood. Carb counting is a method for keeping track of how many carbs you eat. Counting carbs is important to keep your blood glucose at a healthy level, especially if you use insulin or take certain oral diabetes medicines. It is important to know how many carbs you can safely have in each meal. This is different for every person. Your dietitian can help you calculate how many carbs you should have at each meal and for each snack. How does alcohol affect me? Alcohol can cause a sudden decrease in blood glucose (hypoglycemia), especially if you use insulin or take certain oral diabetes medicines. Hypoglycemia can be a life-threatening condition. Symptoms of hypoglycemia, such as sleepiness, dizziness, and confusion, are similar to symptoms of having too much  alcohol.  Do not drink alcohol if: ? Your health care provider tells you not to drink. ? You are pregnant, may be pregnant, or are planning to become pregnant.  If you drink alcohol: ? Do not drink on an empty stomach. ? Limit how much you use to:  0-1 drink a day for women.  0-2 drinks a day for men. ? Be aware of how much alcohol is in your drink. In the [place] one drink equals one 12 oz bottle of beer (355 mL), one 5 oz glass of wine (148 mL), or one 1 oz glass of hard liquor (44 mL). ? Keep yourself hydrated with water, diet soda, or unsweetened iced tea.  Keep in mind that regular soda, juice, and other mixers may contain a lot of sugar and must be counted as carbs. What are tips for following this plan? Reading food labels  Start by checking the serving size on the "Nutrition Facts" label of packaged foods and drinks. The amount of calories, carbs, fats, and other nutrients listed on the label is based on one serving of the item. Many items contain more than one serving per package.  Check the total grams (g) of carbs in one serving. You can calculate the number of servings of carbs in one serving by dividing the total carbs by 15. For example, if a food has 30 g of total carbs per serving, it would be equal to 2 servings of carbs.  Check the number of grams (g) of saturated fats and trans fats in one serving. Choose foods that have   a low amount or none of these fats.  Check the number of milligrams (mg) of salt (sodium) in one serving. Most people should limit total sodium intake to less than 2,300 mg per day.  Always check the nutrition information of foods labeled as "low-fat" or "nonfat." These foods may be higher in added sugar or refined carbs and should be avoided.  Talk to your dietitian to identify your daily goals for nutrients listed on the label. Shopping  Avoid buying canned, pre-made, or processed foods. These foods tend to be high in fat, sodium, and added  sugar.  Shop around the outside edge of the grocery store. This is where you will most often find fresh fruits and vegetables, bulk grains, fresh meats, and fresh dairy. Cooking  Use low-heat cooking methods, such as baking, instead of high-heat cooking methods like deep frying.  Cook using healthy oils, such as olive, canola, or sunflower oil.  Avoid cooking with butter, cream, or high-fat meats. Meal planning  Eat meals and snacks regularly, preferably at the same times every day. Avoid going long periods of time without eating.  Eat foods that are high in fiber, such as fresh fruits, vegetables, beans, and whole grains. Talk with your dietitian about how many servings of carbs you can eat at each meal.  Eat 4-6 oz (112-168 g) of lean protein each day, such as lean meat, chicken, fish, eggs, or tofu. One ounce (oz) of lean protein is equal to: ? 1 oz (28 g) of meat, chicken, or fish. ? 1 egg. ?  cup (62 g) of tofu.  Eat some foods each day that contain healthy fats, such as avocado, nuts, seeds, and fish.   What foods should I eat? Fruits Berries. Apples. Oranges. Peaches. Apricots. Plums. Grapes. Mango. Papaya. Pomegranate. Kiwi. Cherries. Vegetables Lettuce. Spinach. Leafy greens, including kale, chard, collard greens, and mustard greens. Beets. Cauliflower. Cabbage. Broccoli. Carrots. Green beans. Tomatoes. Peppers. Onions. Cucumbers. Brussels sprouts. Grains Whole grains, such as whole-wheat or whole-grain bread, crackers, tortillas, cereal, and pasta. Unsweetened oatmeal. Quinoa. Brown or wild rice. Meats and other proteins Seafood. Poultry without skin. Lean cuts of poultry and beef. Tofu. Nuts. Seeds. Dairy Low-fat or fat-free dairy products such as milk, yogurt, and cheese. The items listed above may not be a complete list of foods and beverages you can eat. Contact a dietitian for more information. What foods should I avoid? Fruits Fruits canned with  syrup. Vegetables Canned vegetables. Frozen vegetables with butter or cream sauce. Grains Refined white flour and flour products such as bread, pasta, snack foods, and cereals. Avoid all processed foods. Meats and other proteins Fatty cuts of meat. Poultry with skin. Breaded or fried meats. Processed meat. Avoid saturated fats. Dairy Full-fat yogurt, cheese, or milk. Beverages Sweetened drinks, such as soda or iced tea. The items listed above may not be a complete list of foods and beverages you should avoid. Contact a dietitian for more information. Questions to ask a health care provider  Do I need to meet with a diabetes educator?  Do I need to meet with a dietitian?  What number can I call if I have questions?  When are the best times to check my blood glucose? Where to find more information:  American Diabetes Association: diabetes.org  Academy of Nutrition and Dietetics: www.eatright.org  National Institute of Diabetes and Digestive and Kidney Diseases: www.niddk.nih.gov  Association of Diabetes Care and Education Specialists: www.diabeteseducator.org Summary  It is important to have healthy eating   habits because your blood sugar (glucose) levels are greatly affected by what you eat and drink.  A healthy meal plan will help you control your blood glucose and maintain a healthy lifestyle.  Your health care provider may recommend that you work with a dietitian to make a meal plan that is best for you.  Keep in mind that carbohydrates (carbs) and alcohol have immediate effects on your blood glucose levels. It is important to count carbs and to use alcohol carefully. This information is not intended to replace advice given to you by your health care provider. Make sure you discuss any questions you have with your health care provider. Document Revised: 11/20/2019 Document Reviewed: 11/20/2019 Elsevier Patient Education  2021 Elsevier Inc.  

## 2021-07-06 NOTE — Progress Notes (Signed)
Subjective:    Patient ID: Renee [name], female    DOB: June 04, 1951, 70 y.o.   MRN: [id number]  Chief Complaint  Patient presents with   Medical Management of Chronic Issues   Pt presents to the office today for follow up. Her DM is uncontrolled and is followed by Podiatry every 8 weeks. She has seen Endocrinologists, but states she has not seen them in awhile because of transportation.      Diabetes She presents for her follow-up diabetic visit. Associated symptoms include foot paresthesias. Pertinent negatives for diabetes include no blurred vision. Risk factors for coronary artery disease include dyslipidemia, diabetes mellitus, hypertension, sedentary lifestyle and post-menopausal. She is following a generally healthy diet. Her overall blood glucose range is >200 mg/dl. An ACE inhibitor/angiotensin II receptor blocker is being taken. Eye exam is current.  Hypertension This is a chronic problem. The current episode started more than 1 year ago. The problem has been waxing and waning since onset. Pertinent negatives include no blurred vision, malaise/fatigue, peripheral edema or shortness of breath. Risk factors for coronary artery disease include dyslipidemia and sedentary lifestyle. The current treatment provides moderate improvement.  Gastroesophageal Reflux She complains of belching and heartburn. This is a chronic problem. The problem occurs rarely. The problem has been waxing and waning. She has tried a PPI for the symptoms. The treatment provided moderate relief.  Hyperlipidemia This is a chronic problem. The current episode started more than 1 year ago. Pertinent negatives include no shortness of breath. Current antihyperlipidemic treatment includes statins. The current treatment provides moderate improvement of lipids. Risk factors for coronary artery disease include dyslipidemia, hypertension, a sedentary lifestyle and post-menopausal.  Diabetic Neuropathy Greatly improved since  taking gabapentin 400 mg TID.    Review of Systems  Constitutional:  Negative for malaise/fatigue.  Eyes:  Negative for blurred vision.  Respiratory:  Negative for shortness of breath.   Gastrointestinal:  Positive for heartburn.  All other systems reviewed and are negative.     Objective:   Physical Exam Vitals reviewed.  Constitutional:      General: She is not in acute distress.    Appearance: She is well-developed. She is obese.  HENT:     Head: Normocephalic and atraumatic.     Right Ear: Tympanic membrane normal.     Left Ear: Tympanic membrane normal.  Eyes:     Pupils: Pupils are equal, round, and reactive to light.  Neck:     Thyroid: No thyromegaly.  Cardiovascular:     Rate and Rhythm: Normal rate and regular rhythm.     Heart sounds: Normal heart sounds. No murmur heard. Pulmonary:     Effort: Pulmonary effort is normal. No respiratory distress.     Breath sounds: Normal breath sounds. No wheezing.  Abdominal:     General: Bowel sounds are normal. There is no distension.     Palpations: Abdomen is soft.     Tenderness: There is no abdominal tenderness.  Musculoskeletal:        General: No tenderness. Normal range of motion.     Cervical back: Normal range of motion and neck supple.  Skin:    General: Skin is warm and dry.  Neurological:     Mental Status: She is alert and oriented to person, place, and time.     Cranial Nerves: No cranial nerve deficit.     Deep Tendon Reflexes: Reflexes are normal and symmetric.  Psychiatric:  Behavior: Behavior normal.        Thought Content: Thought content normal.        Judgment: Judgment normal.     BP 115/65   Pulse 68   Temp (!) 97.3 F (36.3 C) (Temporal)   Ht '5\' 6"'  (1.676 m)   Wt 167 lb (75.8 kg)   BMI 26.95 kg/m       Assessment & Plan:  Renee [name] comes in today with chief complaint of Medical Management of Chronic Issues   Diagnosis and orders addressed:  1. Hypertension  associated with diabetes ([place]) - CMP14+EGFR  2. Gastroesophageal reflux disease with esophagitis, unspecified whether hemorrhage  - CMP14+EGFR  3. Uncontrolled type 2 diabetes mellitus, without long-term current use of insulin (HCC) - CMP14+EGFR - Bayer DCA Hb A1c Waived  4. Diabetic peripheral neuropathy (HCC) - CMP14+EGFR - Bayer DCA Hb A1c Waived  5. Hyperlipidemia associated with type 2 diabetes mellitus (HCC) - CMP14+EGFR - Lipid panel  6. Overweight (BMI 25.0-29.9) - CMP14+EGFR  7. History of CVA (cerebrovascular accident) - CMP14+EGFR   Labs pending Health Maintenance reviewed Diet and exercise encouraged  Follow up plan: 2 months    Evelina Dun, FNP

## 2021-07-07 LAB — LIPID PANEL
Chol/HDL Ratio: 4.2 ratio (ref 0.0–4.4)
Cholesterol, Total: 151 mg/dL (ref 100–199)
HDL: 36 mg/dL — ABNORMAL LOW (ref >39)
LDL Chol Calc (NIH): 88 mg/dL (ref 0–99)
Triglycerides: 157 mg/dL — ABNORMAL HIGH (ref 0–149)
VLDL Cholesterol Cal: 27 mg/dL (ref 5–40)

## 2021-07-07 LAB — CMP14+EGFR
ALT: 19 IU/L (ref 0–32)
AST: 22 IU/L (ref 0–40)
Albumin/Globulin Ratio: 1.4 (ref 1.2–2.2)
Albumin: 4.4 g/dL (ref 3.7–4.7)
Alkaline Phosphatase: 86 IU/L (ref 44–121)
BUN/Creatinine Ratio: 22 (ref 12–28)
BUN: 26 mg/dL (ref 8–27)
Bilirubin Total: 0.4 mg/dL (ref 0.0–1.2)
CO2: 21 mmol/L (ref 20–29)
Calcium: 10.3 mg/dL (ref 8.7–10.3)
Chloride: 102 mmol/L (ref 96–106)
Creatinine, Ser: 1.19 mg/dL — ABNORMAL HIGH (ref 0.57–1.00)
Globulin, Total: 3.2 g/dL (ref 1.5–4.5)
Glucose: 161 mg/dL — ABNORMAL HIGH (ref 65–99)
Potassium: 4.8 mmol/L (ref 3.5–5.2)
Sodium: 143 mmol/L (ref 134–144)
Total Protein: 7.6 g/dL (ref 6.0–8.5)
eGFR: 49 mL/min/1.73 — ABNORMAL LOW (ref >59)

## 2021-07-22 ENCOUNTER — Ambulatory Visit (INDEPENDENT_AMBULATORY_CARE_PROVIDER_SITE_OTHER): Payer: Medicare Other | Admitting: *Deleted

## 2021-07-22 DIAGNOSIS — Z0000 Encounter for general adult medical examination without abnormal findings: Secondary | ICD-10-CM

## 2021-07-22 NOTE — Patient Instructions (Signed)
  [place] Maintenance Summary and Written Plan of Care  Ms. Rissmiller ,  Thank you for allowing me to perform your Medicare Annual Wellness Visit and for your ongoing commitment to your health.   Health Maintenance & Immunization History Health Maintenance  Topic Date Due   COVID-19 Vaccine (1) 07/22/2021 (Originally 03/02/1956)   Zoster Vaccines- Shingrix (1 of 2) 10/06/2021 (Originally 03/01/2000)   PNA vac Low Risk Adult (1 of 2 - PCV13) 11/04/2024 (Originally 03/02/2015)   INFLUENZA VACCINE  07/27/2021   HEMOGLOBIN A1C  01/06/2022   FOOT EXAM  02/05/2022   OPHTHALMOLOGY EXAM  02/23/2022   MAMMOGRAM  03/25/2022   COLONOSCOPY (Pts 45-27yr Insurance coverage will need to be confirmed)  08/17/2022   TETANUS/TDAP  12/27/2022   DEXA SCAN  Completed   Hepatitis C Screening  Completed   HPV VACCINES  Aged Out    There is no immunization history on file for this patient.  These are the patient goals that we discussed:  Goals Addressed             This Visit's Progress    AWV       07/22/2021 AWV Goal: Diabetes Management  Patient will maintain an A1C level below 8.0 Patient will not develop any diabetic foot complications Patient will not experience any hypoglycemic episodes over the next 3 months Patient will notify our office of any CBG readings outside of the provider recommended range by calling [phone] will adhere to provider recommendations for diabetes management  Patient Self Management Activities take all medications as prescribed and report any negative side effects monitor and record blood sugar readings as directed adhere to a low carbohydrate diet that incorporates lean proteins, vegetables, whole grains, low glycemic fruits check feet daily noting any sores, cracks, injuries, or callous formations see PCP or podiatrist if she notices any changes in her legs, feet, or toenails Patient will visit PCP and have an A1C level  checked every 3 to 6 months as directed  have a yearly eye exam to monitor for vascular changes associated with diabetes and will request that the report be sent to her pcp.  consult with her PCP regarding any changes in her health or new or worsening symptoms          This is a list of Health Maintenance Items that are overdue or due now: There are no preventive care reminders to display for this patient.   Orders/Referrals Placed Today: No orders of the defined types were placed in this encounter.  (Contact our referral department at [phone] you have not spoken with someone about your referral appointment within the next 5 days)    Follow-up Plan Follow-up with HSharion Balloon FNP as planned

## 2021-07-22 NOTE — Progress Notes (Signed)
MEDICARE ANNUAL WELLNESS VISIT  07/22/2021  Telephone Visit Disclaimer This Medicare AWV was conducted by telephone due to national recommendations for restrictions regarding the COVID-19 Pandemic (e.g. social distancing).  I verified, using two identifiers, that I am speaking with Renee [name] or their authorized healthcare agent. I discussed the limitations, risks, security, and privacy concerns of performing an evaluation and management service by telephone and the potential availability of an in-person appointment in the future. The patient expressed understanding and agreed to proceed.  Location of Patient: Home  Location of Provider (nurse):  [place]ene[name]  Subjective:    Renee [name] [name] a 70 y.o. female patient of Hawks, Renee Hawthorne, FNP who had a Medicare Annual Wellness Visit today via telephone. Renee [name] Retired and lives alone. she has 0 children. she reports that she [name] socially active and does interact with friends/family regularly. she [name] minimally physically active and enjoys shopping and traveling.  Patient Care Team: Renee Balloon, FNP as PCP - General (Nurse Practitioner) Tat, Renee Quail, DO as Consulting Physician (Neurology) Renee [name], Renee Lines, MD as Consulting Physician (Gastroenterology) Renee Anger, MD as Consulting Physician (Endocrinology) Renee [name], [place] as Consulting Physician (Podiatry)  Advanced Directives 07/22/2021 05/08/2021 11/21/2020 07/21/2020 06/05/2019 10/30/2018 07/14/2017  Does Patient Have a Medical Advance Directive? No No Unable to assess, patient [name] non-responsive or altered mental status Yes No No Yes  Type of Advance Directive - - - [place]  Does patient want to make changes to medical advance directive? - - - No - Patient declined - - No - Patient declined  Copy of [place] in Chart? - - - Yes - validated most recent copy scanned in chart  (See row information) - - Yes  Would patient like information on creating a medical advance directive? No - Patient declined No - Patient declined - - Yes (MAU/Ambulatory/Procedural Areas - Information given) No - Patient declined -    Hospital Utilization Over the Past 12 Months: # of hospitalizations or ER visits: 3-ED visits # of surgeries: 0  Review of Systems    Patient reports that her overall health [name] unchanged compared to last year.  No complaints or concerns today  Patient Reported Readings (BP, Pulse, CBG, Weight, etc) CBG 225  Pain Assessment       Current Medications & Allergies (verified) Allergies as of 07/22/2021       Reactions   Metformin And Related Other (See Comments)   seizures   Penicillins Diarrhea, Nausea And Vomiting        Medication List        Accurate as of July 22, 2021  9:06 AM. If you have any questions, ask your nurse or doctor.          aspirin 81 MG tablet Take 81 mg by mouth daily.   atorvastatin 40 MG tablet Commonly known as: LIPITOR Take 1 tablet by mouth once daily   dapagliflozin propanediol 10 MG Tabs tablet Commonly known as: Farxiga Take 1 tablet (10 mg total) by mouth daily before breakfast.   docusate sodium 100 MG capsule Commonly known as: COLACE Take 100 mg by mouth daily.   gabapentin 400 MG capsule Commonly known as: NEURONTIN TAKE 1 CAPSULE BY MOUTH THREE TIMES DAILY   glipiZIDE 10 MG tablet Commonly known as: GLUCOTROL Take 1 tablet (10 mg total) by mouth 2 (two) times daily before  a meal.   omeprazole 20 MG capsule Commonly known as: PRILOSEC Take 1 capsule by mouth once daily   OneTouch Delica Lancets 87G Misc Use to check BG up to bid.  Dx: E11.65 type 2 DM with insulin therapy, uncontrolled   OneTouch Verio Flex System w/Device Kit Use to check blood sugar twice daily   OneTouch Verio Soln Use as needed to calibrate glucometer   OneTouch Verio test strip Generic drug: glucose  blood TEST GLUCOSE TWICE DAILY Dx E11.95   valsartan-hydrochlorothiazide 160-25 MG tablet Commonly known as: DIOVAN-HCT Take 1 tablet by mouth once daily   Vitamin D (Ergocalciferol) 1.25 MG (50000 UNIT) Caps capsule Commonly known as: DRISDOL Take 1 capsule (50,000 Units total) by mouth every 7 (seven) days. What changed: when to take this        History (reviewed): Past Medical History:  Diagnosis Date   Arthritis    Breast cancer ([place]) 2001   right mastectomy   CVA (cerebral infarction) 2003, 2005   DM (diabetes mellitus) ([place])    GERD (gastroesophageal reflux disease)    Heart murmur    Hyperlipidemia    Hypertension    Seizures ([place])    Vitamin D deficiency    Past Surgical History:  Procedure Laterality Date   ABDOMINAL HYSTERECTOMY  2000   GANGLION CYST EXCISION  2010   right (2010)  and left (before 2010) hand   MASTECTOMY  2001   right   TONSILECTOMY/ADENOIDECTOMY WITH MYRINGOTOMY Bilateral    age 28's   Family History  Problem Relation Age of Onset   Alzheimer's disease Mother    Parkinson's disease Mother    Heart Problems Father    Kidney disease Father        +dialysis   Heart disease Father    Other Sister        +TAH for fibroids   Breast cancer Sister        dx. 55-56   Renal cancer Sister        dx. 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 Enjoys reading and word puzzles.    Social Determinants of Health   Financial Resource Strain: Not on file  Food Insecurity: Not on file  Transportation Needs: Not on file  Physical Activity: Not on file  Stress: Not on file  Social Connections: Not on file  Activities of Daily Living In your present state of health, do you have any difficulty performing the following activities: 07/22/2021  Hearing? N  Vision? N  Comment Wears glasses  Difficulty concentrating or making decisions? Y  Comment At times with remembering  Walking or climbing stairs? N  Dressing or bathing? N  Doing errands, shopping? N  Preparing Food and eating ? N  Using the Toilet? N  In the past six months, have you accidently leaked urine? N  Do you have problems with loss of bowel control? N  Managing your Medications? N  Managing your Finances? N  Housekeeping or managing your Housekeeping? N  Some recent data might be hidden    Patient Education/ Literacy What [name] the last grade level you completed in school?: 12th  Exercise Current Exercise Habits: Home exercise routine, Type of exercise: walking, Time (Minutes): 25, Frequency (Times/Week): 3, Weekly Exercise (Minutes/Week): 75, Intensity: Mild  Diet Patient reports consuming 3 meals a day and 3 snack(s) a day Patient reports that her primary diet [name]: Regular Patient reports that she does have regular access to food.   Depression Screen PHQ 2/9 Scores 07/22/2021 05/05/2021 02/24/2021 02/05/2021 11/28/2020 10/16/2020 07/21/2020  PHQ - 2 Score 0 0 0 0 0 0 0     Fall Risk Fall Risk  07/22/2021 05/05/2021 02/24/2021 02/05/2021 11/28/2020   Falls in the past year? 0 0 0 0 0  Number falls in past yr: 0 - - - 0  Injury with Fall? 0 - - - 0  Risk for fall due to : No Fall Risks - - - No Fall Risks  Follow up Falls prevention discussed - - - Falls evaluation completed     Objective:  Renee [name] seemed alert and oriented and she participated appropriately during our telephone visit.  Blood Pressure Weight BMI  BP Readings from Last 3 Encounters:  07/06/21 115/65  05/08/21 118/69  05/05/21 112/67   Wt Readings from Last 3 Encounters:  07/06/21 167 lb (75.8 kg)  05/05/21 167 lb (75.8 kg)  03/05/21 169 lb (76.7 kg)   BMI Readings from Last 1 Encounters:  07/06/21 26.95 kg/m    *Unable to obtain current vital signs, weight, and BMI due to telephone visit type  Hearing/Vision  Renee [name] did not seem to have difficulty with hearing/understanding during the telephone conversation Reports that she has had a formal eye exam by an eye care professional within the past year Reports that she has not had a formal hearing evaluation within the past year *Unable to fully assess hearing and vision during telephone visit type  Cognitive Function: 6CIT Screen 07/22/2021 07/21/2020 06/05/2019  What Year? 0 points 0 points 0 points  What month? 0 points 0 points 0 points  What time? 0 points 0 points 0 points  Count back from 20 0 points 0 points 2 points  Months in reverse 0 points 4 points 0 points  Repeat phrase 2 points 2 points 0 points  Total Score _0 (Normal:0-7, Significant for Dysfunction: >8)  Normal Cognitive Function Screening: Yes   Immunization & Health Maintenance Record  There [name] no immunization history on file for this patient.  Health Maintenance  Topic Date Due   COVID-19 Vaccine (1) 07/22/2021 (Originally 03/02/1956)   Zoster Vaccines- Shingrix (1 of 2) 10/06/2021 (Originally 03/01/2000)   PNA vac Low Risk Adult (1 of 2 - PCV13) 11/04/2024 (Originally 03/02/2015)   INFLUENZA VACCINE  07/27/2021    HEMOGLOBIN  A1C  01/06/2022   FOOT EXAM  02/05/2022   OPHTHALMOLOGY EXAM  02/23/2022   MAMMOGRAM  03/25/2022   COLONOSCOPY (Pts 45-19yr Insurance coverage will need to be confirmed)  08/17/2022   TETANUS/TDAP  12/27/2022   DEXA SCAN  Completed   Hepatitis C Screening  Completed   HPV VACCINES  Aged Out       Assessment  This [name] a routine wellness examination for Renee [name]  Health Maintenance: Due or Overdue There are no preventive care reminders to display for this patient.  Renee [place]LE[place] not need a referral for Community Assistance: Care Management:   no Social Work:    no Prescription Assistance:  no Nutrition/Diabetes Education:  no   Plan:  Personalized Goals  Goals Addressed             This Visit's Progress    AWV       07/22/2021 AWV Goal: Diabetes Management  Patient will maintain an A1C level below 8.0 Patient will not develop any diabetic foot complications Patient will not experience any hypoglycemic episodes over the next 3 months Patient will notify our office of any CBG readings outside of the provider recommended range by calling [phone] will adhere to provider recommendations for diabetes management  Patient Self Management Activities take all medications as prescribed and report any negative side effects monitor and record blood sugar readings as directed adhere to a low carbohydrate diet that incorporates lean proteins, vegetables, whole grains, low glycemic fruits check feet daily noting any sores, cracks, injuries, or callous formations see PCP or podiatrist if she notices any changes in her legs, feet, or toenails Patient will visit PCP and have an A1C level checked every 3 to 6 months as directed  have a yearly eye exam to monitor for vascular changes associated with diabetes and will request that the report be sent to her pcp.  consult with her PCP regarding any changes in her health or new or worsening symptoms         Personalized Health Maintenance & Screening Recommendations  Pneumococcal vaccine  Shingles Vaccine  Lung Cancer Screening Recommended: no (Low Dose CT Chest recommended if Age 768-80years, 30 pack-year currently smoking OR have quit w/in past 15 years) Hepatitis C Screening recommended: no HIV Screening recommended: no  Advanced Directives: Written information was not prepared per patient's request.  Referrals & Orders No orders of the defined types were placed in this encounter.   Follow-up Plan Follow-up with HSharion Balloon FNP as planned AVS printed and mailed to patient    I have personally reviewed and noted the following in the patient's chart:   Medical and social history Use of alcohol, tobacco or illicit drugs  Current medications and supplements Functional ability and status Nutritional status Physical activity Advanced directives List of other physicians Hospitalizations, surgeries, and ER visits in previous 12 months Vitals Screenings to include cognitive, depression, and falls Referrals and appointments  In addition, I have reviewed and discussed with SBartolo Dartercertain preventive protocols, quality metrics, and best practice recommendations. A written personalized care plan for preventive services as well as general preventive health recommendations [name] available and can be mailed to the patient at her request.     WLynnea Ferrier LPN  [phone]

## 2021-07-24 ENCOUNTER — Ambulatory Visit (INDEPENDENT_AMBULATORY_CARE_PROVIDER_SITE_OTHER): Payer: Medicare Other | Admitting: Pharmacist

## 2021-07-24 DIAGNOSIS — IMO0002 Reserved for concepts with insufficient information to code with codable children: Secondary | ICD-10-CM

## 2021-07-24 DIAGNOSIS — E1165 Type 2 diabetes mellitus with hyperglycemia: Secondary | ICD-10-CM | POA: Diagnosis not present

## 2021-07-28 ENCOUNTER — Ambulatory Visit: Payer: Medicare Other | Admitting: Pharmacist

## 2021-07-31 NOTE — Patient Instructions (Signed)
Visit Information  PATIENT GOALS:  Goals Addressed               This Visit's Progress     Patient Stated     T2DM (pt-stated)        Current Barriers:  Unable to independently afford treatment regimen Unable to achieve control of T2DM   Pharmacist Clinical Goal(s):  Over the next 90 days, patient will verbalize ability to afford treatment regimen achieve control of T2DM as evidenced by IMPROVED GLYCEMIC CONTROL, GOAL A1C<7% through collaboration with PharmD and provider.    Interventions: 1:1 collaboration with Sharion Balloon, FNP regarding development and update of comprehensive plan of care as evidenced by provider attestation and co-signature Inter-disciplinary care team collaboration (see longitudinal plan of care) Comprehensive medication review performed; medication list updated in electronic medical record  Diabetes: Uncontrolled; current treatment:JANUVIA '50MG'$  (JUST STARTED), FARXIGA, GLIPIZIDE;  A1C>10% FOR >35YR MED ACCESS IS AN ISSUE MAY HAVE TO ADD INSULIN, BUT PATIENT DOES NOT WANT TO AT THIS TIME PATIENT NOW HAS ALL MEDICATIONS AND HAS STARTED TAKING THEM--WILL CONTINUE TO FOLLOW METFORMIN CAUSED SEIZURES IN THE PAST GFR 49 Current glucose readings: fasting glucose: N/A-DOESN'T CHECK, post prandial glucose: N/A Denies hypoglycemic/hyperglycemic symptoms Discussed meal planning options and Plate method for healthy eating Avoid sugary drinks and desserts Incorporate balanced protein, non starchy veggies, 1 serving of carbohydrate with each meal Increase water intake Increase physical activity as able Current exercise: N/A Educated on [place], [place] patient finances. ENROLLED PATIENT IN [place] (JANUVIA) & [place] (FARXIGA) PATIENT ASSISTANCE PROGRAMS--ENROLLMENT ENDS 12/26/21   Patient Goals/Self-Care Activities Over the next 90 days, patient will:  - take medications as prescribed check glucose 3X TIMES WEEKLY,  document, and provide at future appointments  Follow Up Plan: Telephone follow up appointment with care management team member scheduled for: 08/21/21         The patient verbalized understanding of instructions, educational materials, and care plan provided today and declined offer to receive copy of patient instructions, educational materials, and care plan.   Telephone follow up appointment with care management team member scheduled for: 08/21/21  Signature Regina Eck, PharmD, BCPS Clinical Pharmacist, [place]  [facility] Phone [phone]

## 2021-07-31 NOTE — Progress Notes (Signed)
Chronic Care Management Pharmacy Note  07/24/2021 Name:  Renee [name] MRN:  [id number] DOB:  10/19/1951  Summary: DIABETES MANAGEMENT   Recommendations/Changes made from today's visit: Diabetes: Uncontrolled; current treatment:JANUVIA 50MG (JUST STARTED), FARXIGA, GLIPIZIDE;  A1C>10% FOR >78YR MED ACCESS IS AN ISSUE MAY HAVE TO ADD INSULIN, BUT PATIENT DOES NOT WANT TO AT THIS TIME PATIENT NOW HAS ALL MEDICATIONS AND HAS STARTED TAKING THEM--WILL CONTINUE TO FOLLOW METFORMIN CAUSED SEIZURES IN THE PAST GFR 49 Current glucose readings: fasting glucose: N/A-DOESN'T CHECK, post prandial glucose: N/A Denies hypoglycemic/hyperglycemic symptoms Discussed meal planning options and Plate method for healthy eating Avoid sugary drinks and desserts Incorporate balanced protein, non starchy veggies, 1 serving of carbohydrate with each meal Increase water intake Increase physical activity as able Current exercise: N/A Educated on DIABETES MEDICATIONS, CONTINUED ADHERENCE Assessed patient finances. ENROLLED PATIENT IN [place] (JANUVIA) & [place] (FARXIGA) PATIENT ASSISTANCE PROGRAMS--ENROLLMENT ENDS 12/26/21  Patient Goals/Self-Care Activities Over the next 90 days, patient will:  - take medications as prescribed check glucose 3X TIMES WEEKLY, document, and provide at future appointments  Follow Up Plan: Telephone follow up appointment with care management team member scheduled for: 08/21/21   Plan:  Subjective: Renee [name] is an 70 y.o. year old female who is a primary patient of Sharion Balloon, FNP.  The CCM team was consulted for assistance with disease management and care coordination needs.    Engaged with patient by telephone for follow up visit in response to provider referral for pharmacy case management and/or care coordination services.   Consent to Services:  The patient was given information about Chronic Care Management services, agreed to services, and gave verbal  consent prior to initiation of services.  Please see initial visit note for detailed documentation.   Patient Care Team: Sharion Balloon, FNP as PCP - General (Nurse Practitioner) Tat, Eustace Quail, DO as Consulting Physician (Neurology) Jerene Bears, MD as Consulting Physician (Gastroenterology) Cassandria Anger, MD as Consulting Physician (Endocrinology) Steffanie Rainwater, DPM as Consulting Physician (Podiatry)   Objective:  Lab Results  Component Value Date   CREATININE 1.19 (H) 07/06/2021   CREATININE 1.29 (H) 05/05/2021   CREATININE 1.16 (H) 03/05/2021    Lab Results  Component Value Date   HGBA1C 10.3 (H) 07/06/2021   Last diabetic Eye exam:  Lab Results  Component Value Date/Time   HMDIABEYEEXA No Retinopathy 02/23/2021 12:00 AM    Last diabetic Foot exam: No results found for: HMDIABFOOTEX      Component Value Date/Time   CHOL 151 07/06/2021 1056   TRIG 157 (H) 07/06/2021 1056   HDL 36 (L) 07/06/2021 1056   CHOLHDL 4.2 07/06/2021 1056   [place] 88 07/06/2021 1056    Hepatic Function Latest Ref Rng & Units 07/06/2021 02/05/2021 11/21/2020  Total Protein 6.0 - 8.5 g/dL 7.6 7.7 6.8  Albumin 3.7 - 4.7 g/dL 4.4 4.4 3.5  AST 0 - 40 IU/L '22 26 21  ' ALT 0 - 32 IU/L '19 24 24  ' Alk Phosphatase 44 - 121 IU/L 86 116 90  Total Bilirubin 0.0 - 1.2 mg/dL 0.4 0.3 0.2(L)    Lab Results  Component Value Date/Time   TSH 2.710 07/05/2016 09:42 AM    CBC Latest Ref Rng & Units 02/05/2021 11/21/2020 10/06/2020  WBC 3.4 - 10.8 x10E3/uL 7.0 5.5 5.8  Hemoglobin 11.1 - 15.9 g/dL 13.3 12.8 12.4  Hematocrit 34.0 - 46.6 % 40.9 40.4 37.6  Platelets 150 - 450 x10E3/uL 345 253  294    Lab Results  Component Value Date/Time   VD25OH 29.3 (L) 12/26/2015 09:10 AM    Clinical ASCVD: No  The 10-year ASCVD risk score Mikey Bussing DC Jr., et al., 2013) is: 17.3%   Values used to calculate the score:     Age: 70 years     Sex: Female     Is Non-Hispanic African American: Yes     Diabetic:  Yes     Tobacco smoker: No     Systolic Blood Pressure: 300 mmHg     Is BP treated: Yes     HDL Cholesterol: 36 mg/dL     Total Cholesterol: 151 mg/dL    Other: (CHADS2VASc if Afib, PHQ9 if depression, MMRC or CAT for COPD, ACT, DEXA)  Social History   Tobacco Use  Smoking Status Never  Smokeless Tobacco Never   BP Readings from Last 3 Encounters:  07/06/21 115/65  05/08/21 118/69  05/05/21 112/67   Pulse Readings from Last 3 Encounters:  07/06/21 68  05/08/21 73  05/05/21 79   Wt Readings from Last 3 Encounters:  07/06/21 167 lb (75.8 kg)  05/05/21 167 lb (75.8 kg)  03/05/21 169 lb (76.7 kg)    Assessment: Review of patient past medical history, allergies, medications, health status, including review of consultants reports, laboratory and other test data, was performed as part of comprehensive evaluation and provision of chronic care management services.   SDOH:  (Social Determinants of Health) assessments and interventions performed:    CCM Care Plan  Allergies  Allergen Reactions   Metformin And Related Other (See Comments)    seizures   Penicillins Diarrhea and Nausea And Vomiting    Medications Reviewed Today     Reviewed by Thana Ates, LPN (Licensed Practical Nurse) on 07/22/21 at [phone]  Med List Status: <None>   Medication Order Taking? Sig Documenting Provider Last Dose Status Informant  aspirin 81 MG tablet [id number] Yes Take 81 mg by mouth daily. [provider] Taking Active Self  atorvastatin (LIPITOR) 40 MG tablet [id number] Yes Take 1 tablet by mouth once daily Evelina Dun A, FNP Taking Active   Blood Glucose Calibration (ONETOUCH VERIO) SOLN [id number] Yes Use as needed to calibrate glucometer Eckard, Tammy, PharmD Taking Active Self  Blood Glucose Monitoring Suppl ([place]) w/Device KIT [id number] Yes Use to check blood sugar twice daily Evelina Dun A, FNP Taking Active   dapagliflozin propanediol (FARXIGA) 10 MG  TABS tablet [id number] Yes Take 1 tablet (10 mg total) by mouth daily before breakfast. Evelina Dun A, FNP Taking Active   docusate sodium (COLACE) 100 MG capsule [id number] Yes Take 100 mg by mouth daily. [provider] Taking Active Self  gabapentin (NEURONTIN) 400 MG capsule [id number] Yes TAKE 1 CAPSULE BY MOUTH THREE TIMES DAILY Hawks, Christy A, FNP Taking Active   glipiZIDE (GLUCOTROL) 10 MG tablet [id number] Yes Take 1 tablet (10 mg total) by mouth 2 (two) times daily before a meal. Sharion Balloon, FNP Taking Active   glucose blood (ONETOUCH VERIO) test strip [id number] Yes TEST GLUCOSE TWICE DAILY Dx E11.95 Sharion Balloon, FNP Taking Active   omeprazole (PRILOSEC) 20 MG capsule [id number] Yes Take 1 capsule by mouth once daily Sharion Balloon, FNP Taking Active   [facility] LANCETS 34J MISC [id number] Yes Use to check BG up to bid.  Dx: E11.65 type 2 DM with insulin therapy, uncontrolled Eckard, Tammy, PharmD Taking Active Self  valsartan-hydrochlorothiazide (DIOVAN-HCT) 160-25 MG  tablet [id number] Yes Take 1 tablet by mouth once daily Evelina Dun A, FNP Taking Active   Vitamin D, Ergocalciferol, (DRISDOL) 1.25 MG (50000 UT) CAPS capsule [id number] Yes Take 1 capsule (50,000 Units total) by mouth every 7 (seven) days.  Patient taking differently: Take 50,000 Units by mouth every 14 (fourteen) days.   Sharion Balloon, FNP Taking Active             Patient Active Problem List   Diagnosis Date Noted   Occipital neuralgia of right side 10/16/2020   Essential hypertension, benign 02/15/2019   Mixed hyperlipidemia 02/15/2019   Overweight (BMI 25.0-29.9) 01/03/2017   Constipation 01/03/2017   Diabetic peripheral neuropathy ([place]) 09/30/2016   Uncontrolled type 2 diabetes mellitus, without long-term current use of insulin ([place]) 07/05/2016   History of breast cancer in female 04/02/2016   Family history of breast cancer in sister 04/02/2016   GERD (gastroesophageal reflux  disease) 02/05/2014   Hyperlipidemia associated with type 2 diabetes mellitus ([place]) 02/05/2014   Hypertension associated with diabetes ([place]) 02/05/2014   History of CVA (cerebrovascular accident) 02/05/2014     There is no immunization history on file for this patient.  Conditions to be addressed/monitored: DMII  Care Plan : PHARMD MEDICATION MANAGEMENT  Updates made by Lavera Guise, RPH since 07/31/2021 12:00 AM     Problem: DISEASE PROGRESION PREVENTION      Long-Range Goal: T2DM   This Visit's Progress: Not on track  Priority: High  Note:   Current Barriers:  Unable to independently afford treatment regimen Unable to achieve control of T2DM   Pharmacist Clinical Goal(s):  Over the next 90 days, patient will verbalize ability to afford treatment regimen achieve control of T2DM as evidenced by IMPROVED GLYCEMIC CONTROL, GOAL A1C<7%  through collaboration with PharmD and provider.    Interventions: 1:1 collaboration with Sharion Balloon, FNP regarding development and update of comprehensive plan of care as evidenced by provider attestation and co-signature Inter-disciplinary care team collaboration (see longitudinal plan of care) Comprehensive medication review performed; medication list updated in electronic medical record  Diabetes: Uncontrolled; current treatment:JANUVIA 50MG (JUST STARTED), FARXIGA, GLIPIZIDE;  A1C>10% FOR >44YR MED ACCESS IS AN ISSUE MAY HAVE TO ADD INSULIN, BUT PATIENT DOES NOT WANT TO AT THIS TIME PATIENT NOW HAS ALL MEDICATIONS AND HAS STARTED TAKING THEM--WILL CONTINUE TO FOLLOW METFORMIN CAUSED SEIZURES IN THE PAST GFR 49 Current glucose readings: fasting glucose: N/A-DOESN'T CHECK, post prandial glucose: N/A Denies hypoglycemic/hyperglycemic symptoms Discussed meal planning options and Plate method for healthy eating Avoid sugary drinks and desserts Incorporate balanced protein, non starchy veggies, 1 serving of carbohydrate with each  meal Increase water intake Increase physical activity as able Current exercise: N/A Educated on [place], CONTINUED ADHERENCE Assessed patient finances. ENROLLED PATIENT IN [place] (JANUVIA) & AZ&ME (FARXIGA) PATIENT ASSISTANCE PROGRAMS--ENROLLMENT ENDS 12/26/21   Patient Goals/Self-Care Activities Over the next 90 days, patient will:  - take medications as prescribed check glucose 3X TIMES WEEKLY, document, and provide at future appointments  Follow Up Plan: Telephone follow up appointment with care management team member scheduled for: 08/21/21      Medication Assistance:  FARXIGA obtained through [place] medication assistance program.  Enrollment ends 12/26/21  Patient's preferred pharmacy is:  [facility], [place] [address] [place] [address] Phone: [phone] Fax: [facility] [address] [place], [place] [address] [place]  [place] [address] Phone: [phone] Fax: [phone]  [place], [address] [place] [address] Phone: [phone] Fax: [place], [address] [place] [place] [address] Phone: [phone] Fax: [phone]  Uses pill box? No - N/A   Follow Up:  Patient agrees to Care Plan and Follow-up.  Plan: Telephone follow up appointment with care management team member scheduled for:  08/21/21  Regina Eck, PharmD, BCPS Clinical Pharmacist, [place]  II Phone [phone]

## 2021-08-03 ENCOUNTER — Other Ambulatory Visit: Payer: Self-pay | Admitting: *Deleted

## 2021-08-03 DIAGNOSIS — K2100 Gastro-esophageal reflux disease with esophagitis, without bleeding: Secondary | ICD-10-CM

## 2021-08-03 MED ORDER — OMEPRAZOLE 20 MG PO CPDR: 20.0000 mg | DELAYED_RELEASE_CAPSULE | Freq: Every day | ORAL | 1 refills | Status: DC

## 2021-08-04 ENCOUNTER — Other Ambulatory Visit: Payer: Self-pay | Admitting: Family

## 2021-08-04 DIAGNOSIS — K2100 Gastro-esophageal reflux disease with esophagitis, without bleeding: Secondary | ICD-10-CM

## 2021-08-06 ENCOUNTER — Other Ambulatory Visit: Payer: Self-pay | Admitting: Family

## 2021-08-06 DIAGNOSIS — E1159 Type 2 diabetes mellitus with other circulatory complications: Secondary | ICD-10-CM

## 2021-08-21 ENCOUNTER — Ambulatory Visit (INDEPENDENT_AMBULATORY_CARE_PROVIDER_SITE_OTHER): Payer: Medicare Other | Admitting: Pharmacist

## 2021-08-21 DIAGNOSIS — E1165 Type 2 diabetes mellitus with hyperglycemia: Secondary | ICD-10-CM

## 2021-08-21 DIAGNOSIS — IMO0002 Reserved for concepts with insufficient information to code with codable children: Secondary | ICD-10-CM

## 2021-08-26 DIAGNOSIS — E1165 Type 2 diabetes mellitus with hyperglycemia: Secondary | ICD-10-CM | POA: Diagnosis not present

## 2021-09-02 NOTE — Patient Instructions (Signed)
Visit Information  PATIENT GOALS:  Goals Addressed               This Visit's Progress     Patient Stated     T2DM (pt-stated)        Current Barriers:  Unable to independently afford treatment regimen Unable to achieve control of T2DM   Pharmacist Clinical Goal(s):  Over the next 90 days, patient will verbalize ability to afford treatment regimen achieve control of T2DM as evidenced by IMPROVED GLYCEMIC CONTROL, GOAL A1C<7% through collaboration with PharmD and provider.    Interventions: 1:1 collaboration with Sharion Balloon, FNP regarding development and update of comprehensive plan of care as evidenced by provider attestation and co-signature Inter-disciplinary care team collaboration (see longitudinal plan of care) Comprehensive medication review performed; medication list updated in electronic medical record  Diabetes: Uncontrolled; current treatment:JANUVIA '50MG'$ , FARXIGA, GLIPIZIDE;  A1C>10% FOR >36YR MED ACCESS IS AN ISSUE MAY HAVE TO ADD INSULIN, BUT PATIENT DOES NOT WANT TO AT THIS TIME Can increase januvia but will need to recheck labs PATIENT NOW HAS ALL MEDICATIONS AND HAS STARTED TAKING THEM--WILL CONTINUE TO [place] GFR 49 Will see PCP on 09/08/21 Current glucose readings: fasting glucose: N/A-DOESN'T CHECK, post prandial glucose: N/A Denies hypoglycemic/hyperglycemic symptoms Discussed meal planning options and Plate method for healthy eating Avoid sugary drinks and desserts Incorporate balanced protein, non starchy veggies, 1 serving of carbohydrate with each meal Increase water intake Increase physical activity as able Current exercise: N/A Educated on [place], CONTINUED ADHERENCE Assessed patient finances. ENROLLED PATIENT IN [place] (JANUVIA) & AZ&ME (FARXIGA) PATIENT ASSISTANCE PROGRAMS--ENROLLMENT ENDS 12/26/21   Patient Goals/Self-Care Activities Over the next 90 days, patient will:  - take  medications as prescribed check glucose 3X TIMES WEEKLY, document, and provide at future appointments  Follow Up Plan: Telephone follow up appointment with care management team member scheduled for: 09/2021         The patient verbalized understanding of instructions, educational materials, and care plan provided today and declined offer to receive copy of patient instructions, educational materials, and care plan.   Telephone follow up appointment with care management team member scheduled for: 2 months  Regina Eck, PharmD, BCPS Clinical Pharmacist, [place]  II Phone [phone]

## 2021-09-02 NOTE — Progress Notes (Signed)
Chronic Care Management Pharmacy Note  08/21/2021 Name:  Renee [name] MRN:  [id number] DOB:  1951/04/24  Summary: T2DM  Recommendations/Changes made from today's visit: Diabetes: Uncontrolled; current treatment:JANUVIA 50MG, FARXIGA, GLIPIZIDE;  A1C>10% FOR >40YR MED ACCESS IS AN ISSUE MAY HAVE TO ADD INSULIN, BUT PATIENT DOES NOT WANT TO AT THIS TIME Can increase januvia but will need to recheck labs PATIENT NOW HAS ALL MEDICATIONS AND HAS STARTED TAKING THEM--WILL CONTINUE TO FOLLOW METFORMIN CAUSED SEIZURES IN THE PAST GFR 49 Will see PCP on 09/08/21 Current glucose readings: fasting glucose: N/A-DOESN'T CHECK, post prandial glucose: N/A Denies hypoglycemic/hyperglycemic symptoms Discussed meal planning options and Plate method for healthy eating Avoid sugary drinks and desserts Incorporate balanced protein, non starchy veggies, 1 serving of carbohydrate with each meal Increase water intake Increase physical activity as able Current exercise: N/A Educated on DIABETES MEDICATIONS, CONTINUED ADHERENCE Assessed patient finances. ENROLLED PATIENT IN [place]CK (JANUVIA) & AZ&ME (FARXIGA) PATIENT ASSISTANCE PROGRAMS--ENROLLMENT ENDS 12/26/21   Patient Goals/Self-Care Activities Over the next 90 days, patient will:  - take medications as prescribed check glucose 3X TIMES WEEKLY, document, and provide at future appointments  Follow Up Plan: Telephone follow up appointment with care management team member scheduled for: 09/2021   Plan:  Subjective: Renee [name] is an 70 y.o. year old female who is a primary patient of Sharion Balloon, FNP.  The CCM team was consulted for assistance with disease management and care coordination needs.    Engaged with patient by telephone for follow up visit in response to provider referral for pharmacy case management and/or care coordination services.   Consent to Services:  The patient was given information about Chronic Care Management  services, agreed to services, and gave verbal consent prior to initiation of services.  Please see initial visit note for detailed documentation.   Patient Care Team: Sharion Balloon, FNP as PCP - General (Nurse Practitioner) Tat, Eustace Quail, DO as Consulting Physician (Neurology) Pyrtle, Lajuan Lines, MD as Consulting Physician (Gastroenterology) Cassandria Anger, MD as Consulting Physician (Endocrinology) Steffanie Rainwater, DPM as Consulting Physician (Podiatry) Lavera Guise, [facility] as Pharmacist (Family Medicine)   Objective:  Lab Results  Component Value Date   CREATININE 1.19 (H) 07/06/2021   CREATININE 1.29 (H) 05/05/2021   CREATININE 1.16 (H) 03/05/2021    Lab Results  Component Value Date   HGBA1C 10.3 (H) 07/06/2021   Last diabetic Eye exam:  Lab Results  Component Value Date/Time   HMDIABEYEEXA No Retinopathy 02/23/2021 12:00 AM    Last diabetic Foot exam: No results found for: HMDIABFOOTEX      Component Value Date/Time   CHOL 151 07/06/2021 1056   TRIG 157 (H) 07/06/2021 1056   HDL 36 (L) 07/06/2021 1056   CHOLHDL 4.2 07/06/2021 1056   [place] 88 07/06/2021 1056    Hepatic Function Latest Ref Rng & Units 07/06/2021 02/05/2021 11/21/2020  Total Protein 6.0 - 8.5 g/dL 7.6 7.7 6.8  Albumin 3.7 - 4.7 g/dL 4.4 4.4 3.5  AST 0 - 40 IU/L '22 26 21  ' ALT 0 - 32 IU/L '19 24 24  ' Alk Phosphatase 44 - 121 IU/L 86 116 90  Total Bilirubin 0.0 - 1.2 mg/dL 0.4 0.3 0.2(L)    Lab Results  Component Value Date/Time   TSH 2.710 07/05/2016 09:42 AM    CBC Latest Ref Rng & Units 02/05/2021 11/21/2020 10/06/2020  WBC 3.4 - 10.8 x10E3/uL 7.0 5.5 5.8  Hemoglobin 11.1 - 15.9 g/dL  13.3 12.8 12.4  Hematocrit 34.0 - 46.6 % 40.9 40.4 37.6  Platelets 150 - 450 x10E3/uL 345 253 294    Lab Results  Component Value Date/Time   VD25OH 29.3 (L) 12/26/2015 09:10 AM    Clinical ASCVD: No  The 10-year ASCVD risk score Mikey Bussing DC Jr., et al., 2013) is: 17.3%   Values used to calculate the  score:     Age: 47 years     Sex: Female     Is Non-Hispanic African American: Yes     Diabetic: Yes     Tobacco smoker: No     Systolic Blood Pressure: 381 mmHg     Is BP treated: Yes     HDL Cholesterol: 36 mg/dL     Total Cholesterol: 151 mg/dL    Other: (CHADS2VASc if Afib, PHQ9 if depression, MMRC or CAT for COPD, ACT, DEXA)  Social History   Tobacco Use  Smoking Status Never  Smokeless Tobacco Never   BP Readings from Last 3 Encounters:  07/06/21 115/65  05/08/21 118/69  05/05/21 112/67   Pulse Readings from Last 3 Encounters:  07/06/21 68  05/08/21 73  05/05/21 79   Wt Readings from Last 3 Encounters:  07/06/21 167 lb (75.8 kg)  05/05/21 167 lb (75.8 kg)  03/05/21 169 lb (76.7 kg)    Assessment: Review of patient past medical history, allergies, medications, health status, including review of consultants reports, laboratory and other test data, was performed as part of comprehensive evaluation and provision of chronic care management services.   SDOH:  (Social Determinants of Health) assessments and interventions performed:    CCM Care Plan  Allergies  Allergen Reactions   Metformin And Related Other (See Comments)    seizures   Penicillins Diarrhea and Nausea And Vomiting    Medications Reviewed Today     Reviewed by Thana Ates, LPN (Licensed Practical Nurse) on 07/22/21 at [phone]  Med List Status: <None>   Medication Order Taking? Sig Documenting Provider Last Dose Status Informant  aspirin 81 MG tablet [id number] Yes Take 81 mg by mouth daily. [provider] Taking Active Self  atorvastatin (LIPITOR) 40 MG tablet [id number] Yes Take 1 tablet by mouth once daily Evelina Dun A, FNP Taking Active   Blood Glucose Calibration (ONETOUCH VERIO) SOLN [id number] Yes Use as needed to calibrate glucometer Eckard, Tammy, PharmD Taking Active Self  Blood Glucose Monitoring Suppl ([place]) w/Device KIT [id number] Yes Use to check blood  sugar twice daily Evelina Dun A, FNP Taking Active   dapagliflozin propanediol (FARXIGA) 10 MG TABS tablet [id number] Yes Take 1 tablet (10 mg total) by mouth daily before breakfast. Evelina Dun A, FNP Taking Active   docusate sodium (COLACE) 100 MG capsule [id number] Yes Take 100 mg by mouth daily. [provider] Taking Active Self  gabapentin (NEURONTIN) 400 MG capsule [id number] Yes TAKE 1 CAPSULE BY MOUTH THREE TIMES DAILY Hawks, Christy A, FNP Taking Active   glipiZIDE (GLUCOTROL) 10 MG tablet [id number] Yes Take 1 tablet (10 mg total) by mouth 2 (two) times daily before a meal. Sharion Balloon, FNP Taking Active   glucose blood (ONETOUCH VERIO) test strip [id number] Yes TEST GLUCOSE TWICE DAILY Dx E11.95 Sharion Balloon, FNP Taking Active   omeprazole (PRILOSEC) 20 MG capsule [id number] Yes Take 1 capsule by mouth once daily Sharion Balloon, FNP Taking Active   [facility] LANCETS 73Z MISC [id number] Yes Use to check BG up to bid.  Dx: E11.65 type 2 DM with insulin therapy, uncontrolled Eckard, Tammy, PharmD Taking Active Self  valsartan-hydrochlorothiazide (DIOVAN-HCT) 160-25 MG tablet [id number] Yes Take 1 tablet by mouth once daily Evelina Dun A, FNP Taking Active   Vitamin D, Ergocalciferol, (DRISDOL) 1.25 MG (50000 UT) CAPS capsule [id number] Yes Take 1 capsule (50,000 Units total) by mouth every 7 (seven) days.  Patient taking differently: Take 50,000 Units by mouth every 14 (fourteen) days.   Sharion Balloon, FNP Taking Active             Patient Active Problem List   Diagnosis Date Noted   Occipital neuralgia of right side 10/16/2020   Essential hypertension, benign 02/15/2019   Mixed hyperlipidemia 02/15/2019   Overweight (BMI 25.0-29.9) 01/03/2017   Constipation 01/03/2017   Diabetic peripheral neuropathy ([place]) 09/30/2016   Uncontrolled type 2 diabetes mellitus, without long-term current use of insulin ([place]) 07/05/2016   History of breast cancer in female  04/02/2016   Family history of breast cancer in sister 04/02/2016   GERD (gastroesophageal reflux disease) 02/05/2014   Hyperlipidemia associated with type 2 diabetes mellitus ([place]) 02/05/2014   Hypertension associated with diabetes ([place]) 02/05/2014   History of CVA (cerebrovascular accident) 02/05/2014     There is no immunization history on file for this patient.  Conditions to be addressed/monitored: DMII  Care Plan : PHARMD MEDICATION MANAGEMENT  Updates made by Lavera Guise, RPH since 09/02/2021 12:00 AM     Problem: DISEASE PROGRESION PREVENTION      Long-Range Goal: T2DM   Recent Progress: Not on track  Priority: High  Note:   Current Barriers:  Unable to independently afford treatment regimen Unable to achieve control of T2DM   Pharmacist Clinical Goal(s):  Over the next 90 days, patient will verbalize ability to afford treatment regimen achieve control of T2DM as evidenced by IMPROVED GLYCEMIC CONTROL, GOAL A1C<7%  through collaboration with PharmD and provider.    Interventions: 1:1 collaboration with Sharion Balloon, FNP regarding development and update of comprehensive plan of care as evidenced by provider attestation and co-signature Inter-disciplinary care team collaboration (see longitudinal plan of care) Comprehensive medication review performed; medication list updated in electronic medical record  Diabetes: Uncontrolled; current treatment:JANUVIA 50MG, FARXIGA, GLIPIZIDE;  A1C>10% FOR >44YR MED ACCESS IS AN ISSUE MAY HAVE TO ADD INSULIN, BUT PATIENT DOES NOT WANT TO AT THIS TIME PATIENT NOW HAS ALL MEDICATIONS AND HAS STARTED TAKING THEM--WILL CONTINUE TO FOLLOW METFORMIN CAUSED SEIZURES IN THE PAST GFR 49 Will see PCP on 09/08/21 Current glucose readings: fasting glucose: N/A-DOESN'T CHECK, post prandial glucose: N/A Denies hypoglycemic/hyperglycemic symptoms Discussed meal planning options and Plate method for healthy eating Avoid sugary drinks  and desserts Incorporate balanced protein, non starchy veggies, 1 serving of carbohydrate with each meal Increase water intake Increase physical activity as able Current exercise: N/A Educated on [place], CONTINUED ADHERENCE Assessed patient finances. ENROLLED PATIENT IN [place] (JANUVIA) & AZ&ME (FARXIGA) PATIENT ASSISTANCE PROGRAMS--ENROLLMENT ENDS 12/26/21   Patient Goals/Self-Care Activities Over the next 90 days, patient will:  - take medications as prescribed check glucose 3X TIMES WEEKLY, document, and provide at future appointments  Follow Up Plan: Telephone follow up appointment with care management team member scheduled for: 09/2021      Medication Assistance:  Celesta Gentile obtained through [facility] medication assistance program.  Enrollment ends 12/26/21 Farxiga via AZ&me ends 12/26/21  Patient's preferred pharmacy is:  [id number] - [place], [place] - [address]  [place] [address] Phone: [phone] Fax: [phone]  [place] [address] [place] [address] [place] [address] Phone: [phone] Fax: [phone] - [place], [place] [place] [address] Phone: [phone] Fax: [place], [address] [place] [address] Phone: [phone] Fax: [phone]   Follow Up:  Patient agrees to Care Plan and Follow-up.  Plan: Telephone follow up appointment with care management team member scheduled for:  2 months    Regina Eck, PharmD, BCPS Clinical Pharmacist, [place]  II Phone [phone]

## 2021-09-08 ENCOUNTER — Encounter: Payer: Self-pay | Admitting: Family

## 2021-09-08 ENCOUNTER — Other Ambulatory Visit: Payer: Self-pay

## 2021-09-08 ENCOUNTER — Ambulatory Visit (INDEPENDENT_AMBULATORY_CARE_PROVIDER_SITE_OTHER): Payer: Medicare Other | Admitting: Family

## 2021-09-08 VITALS — BP 126/80 | HR 75 | Temp 97.1°F | Ht 66.0 in | Wt 167.6 lb

## 2021-09-08 DIAGNOSIS — IMO0002 Reserved for concepts with insufficient information to code with codable children: Secondary | ICD-10-CM

## 2021-09-08 DIAGNOSIS — E785 Hyperlipidemia, unspecified: Secondary | ICD-10-CM

## 2021-09-08 DIAGNOSIS — I10 Essential (primary) hypertension: Secondary | ICD-10-CM

## 2021-09-08 DIAGNOSIS — E1165 Type 2 diabetes mellitus with hyperglycemia: Secondary | ICD-10-CM | POA: Diagnosis not present

## 2021-09-08 DIAGNOSIS — E1169 Type 2 diabetes mellitus with other specified complication: Secondary | ICD-10-CM

## 2021-09-08 DIAGNOSIS — E663 Overweight: Secondary | ICD-10-CM

## 2021-09-08 DIAGNOSIS — K2100 Gastro-esophageal reflux disease with esophagitis, without bleeding: Secondary | ICD-10-CM | POA: Diagnosis not present

## 2021-09-08 LAB — BAYER DCA HB A1C WAIVED: HB A1C (BAYER DCA - WAIVED): 10.1 % — ABNORMAL HIGH (ref 4.8–5.6)

## 2021-09-08 NOTE — Patient Instructions (Signed)
Diabetes Mellitus and Nutrition, Adult When you have diabetes, or diabetes mellitus, it is very important to have healthy eating habits because your blood sugar (glucose) levels are greatly affected by what you eat and drink. Eating healthy foods in the right amounts, at about the same times every day, can help you:  Control your blood glucose.  Lower your risk of heart disease.  Improve your blood pressure.  Reach or maintain a healthy weight. What can affect my meal plan? Every person with diabetes is different, and each person has different needs for a meal plan. Your health care provider may recommend that you work with a dietitian to make a meal plan that is best for you. Your meal plan may vary depending on factors such as:  The calories you need.  The medicines you take.  Your weight.  Your blood glucose, blood pressure, and cholesterol levels.  Your activity level.  Other health conditions you have, such as heart or kidney disease. How do carbohydrates affect me? Carbohydrates, also called carbs, affect your blood glucose level more than any other type of food. Eating carbs naturally raises the amount of glucose in your blood. Carb counting is a method for keeping track of how many carbs you eat. Counting carbs is important to keep your blood glucose at a healthy level, especially if you use insulin or take certain oral diabetes medicines. It is important to know how many carbs you can safely have in each meal. This is different for every person. Your dietitian can help you calculate how many carbs you should have at each meal and for each snack. How does alcohol affect me? Alcohol can cause a sudden decrease in blood glucose (hypoglycemia), especially if you use insulin or take certain oral diabetes medicines. Hypoglycemia can be a life-threatening condition. Symptoms of hypoglycemia, such as sleepiness, dizziness, and confusion, are similar to symptoms of having too much  alcohol.  Do not drink alcohol if: ? Your health care provider tells you not to drink. ? You are pregnant, may be pregnant, or are planning to become pregnant.  If you drink alcohol: ? Do not drink on an empty stomach. ? Limit how much you use to:  0-1 drink a day for women.  0-2 drinks a day for men. ? Be aware of how much alcohol is in your drink. In the [place] one drink equals one 12 oz bottle of beer (355 mL), one 5 oz glass of wine (148 mL), or one 1 oz glass of hard liquor (44 mL). ? Keep yourself hydrated with water, diet soda, or unsweetened iced tea.  Keep in mind that regular soda, juice, and other mixers may contain a lot of sugar and must be counted as carbs. What are tips for following this plan? Reading food labels  Start by checking the serving size on the "Nutrition Facts" label of packaged foods and drinks. The amount of calories, carbs, fats, and other nutrients listed on the label is based on one serving of the item. Many items contain more than one serving per package.  Check the total grams (g) of carbs in one serving. You can calculate the number of servings of carbs in one serving by dividing the total carbs by 15. For example, if a food has 30 g of total carbs per serving, it would be equal to 2 servings of carbs.  Check the number of grams (g) of saturated fats and trans fats in one serving. Choose foods that have   a low amount or none of these fats.  Check the number of milligrams (mg) of salt (sodium) in one serving. Most people should limit total sodium intake to less than 2,300 mg per day.  Always check the nutrition information of foods labeled as "low-fat" or "nonfat." These foods may be higher in added sugar or refined carbs and should be avoided.  Talk to your dietitian to identify your daily goals for nutrients listed on the label. Shopping  Avoid buying canned, pre-made, or processed foods. These foods tend to be high in fat, sodium, and added  sugar.  Shop around the outside edge of the grocery store. This is where you will most often find fresh fruits and vegetables, bulk grains, fresh meats, and fresh dairy. Cooking  Use low-heat cooking methods, such as baking, instead of high-heat cooking methods like deep frying.  Cook using healthy oils, such as olive, canola, or sunflower oil.  Avoid cooking with butter, cream, or high-fat meats. Meal planning  Eat meals and snacks regularly, preferably at the same times every day. Avoid going long periods of time without eating.  Eat foods that are high in fiber, such as fresh fruits, vegetables, beans, and whole grains. Talk with your dietitian about how many servings of carbs you can eat at each meal.  Eat 4-6 oz (112-168 g) of lean protein each day, such as lean meat, chicken, fish, eggs, or tofu. One ounce (oz) of lean protein is equal to: ? 1 oz (28 g) of meat, chicken, or fish. ? 1 egg. ?  cup (62 g) of tofu.  Eat some foods each day that contain healthy fats, such as avocado, nuts, seeds, and fish.   What foods should I eat? Fruits Berries. Apples. Oranges. Peaches. Apricots. Plums. Grapes. Mango. Papaya. Pomegranate. Kiwi. Cherries. Vegetables Lettuce. Spinach. Leafy greens, including kale, chard, collard greens, and mustard greens. Beets. Cauliflower. Cabbage. Broccoli. Carrots. Green beans. Tomatoes. Peppers. Onions. Cucumbers. Brussels sprouts. Grains Whole grains, such as whole-wheat or whole-grain bread, crackers, tortillas, cereal, and pasta. Unsweetened oatmeal. Quinoa. Brown or wild rice. Meats and other proteins Seafood. Poultry without skin. Lean cuts of poultry and beef. Tofu. Nuts. Seeds. Dairy Low-fat or fat-free dairy products such as milk, yogurt, and cheese. The items listed above may not be a complete list of foods and beverages you can eat. Contact a dietitian for more information. What foods should I avoid? Fruits Fruits canned with  syrup. Vegetables Canned vegetables. Frozen vegetables with butter or cream sauce. Grains Refined white flour and flour products such as bread, pasta, snack foods, and cereals. Avoid all processed foods. Meats and other proteins Fatty cuts of meat. Poultry with skin. Breaded or fried meats. Processed meat. Avoid saturated fats. Dairy Full-fat yogurt, cheese, or milk. Beverages Sweetened drinks, such as soda or iced tea. The items listed above may not be a complete list of foods and beverages you should avoid. Contact a dietitian for more information. Questions to ask a health care provider  Do I need to meet with a diabetes educator?  Do I need to meet with a dietitian?  What number can I call if I have questions?  When are the best times to check my blood glucose? Where to find more information:  American Diabetes Association: diabetes.org  Academy of Nutrition and Dietetics: www.eatright.org  National Institute of Diabetes and Digestive and Kidney Diseases: www.niddk.nih.gov  Association of Diabetes Care and Education Specialists: www.diabeteseducator.org Summary  It is important to have healthy eating   habits because your blood sugar (glucose) levels are greatly affected by what you eat and drink.  A healthy meal plan will help you control your blood glucose and maintain a healthy lifestyle.  Your health care provider may recommend that you work with a dietitian to make a meal plan that is best for you.  Keep in mind that carbohydrates (carbs) and alcohol have immediate effects on your blood glucose levels. It is important to count carbs and to use alcohol carefully. This information is not intended to replace advice given to you by your health care provider. Make sure you discuss any questions you have with your health care provider. Document Revised: 11/20/2019 Document Reviewed: 11/20/2019 Elsevier Patient Education  2021 Elsevier Inc.  

## 2021-09-08 NOTE — Progress Notes (Signed)
Subjective:    Patient ID: Renee [name], female    DOB: 1951-07-25, 70 y.o.   MRN: [id number]  Chief Complaint  Patient presents with   Diabetes   Pt presents to the office today for follow up. Her DM is uncontrolled and is followed by Podiatry every 8 weeks. She has seen Endocrinologists, but states she has not seen them in awhile because of transportation. Diabetes She presents for her follow-up diabetic visit. She has type 2 diabetes mellitus. Associated symptoms include foot paresthesias. Pertinent negatives for diabetes include no blurred vision. Symptoms are stable. Diabetic complications include heart disease and peripheral neuropathy. Risk factors for coronary artery disease include dyslipidemia, hypertension, sedentary lifestyle, post-menopausal and diabetes mellitus. She is following a generally healthy diet. Her overall blood glucose range is 180-200 mg/dl. Eye exam is current.  Hypertension This is a chronic problem. The current episode started more than 1 year ago. The problem has been resolved since onset. The problem is controlled. Pertinent negatives include no blurred vision, malaise/fatigue or peripheral edema. Risk factors for coronary artery disease include dyslipidemia, diabetes mellitus, obesity and female gender. The current treatment provides moderate improvement.  Gastroesophageal Reflux She complains of belching and heartburn. This is a chronic problem. The current episode started more than 1 year ago. The problem occurs occasionally. The problem has been waxing and waning. She has tried a PPI for the symptoms. The treatment provided moderate relief.  Hyperlipidemia This is a chronic problem. The current episode started more than 1 year ago. The problem is controlled. Recent lipid tests were reviewed and are normal. Exacerbating diseases include obesity. Current antihyperlipidemic treatment includes statins. The current treatment provides moderate improvement of lipids.   Constipation This is a chronic problem. The problem has been resolved since onset. Her stool frequency is 1 time per day. Risk factors include obesity.     Review of Systems  Constitutional:  Negative for malaise/fatigue.  Eyes:  Negative for blurred vision.  Gastrointestinal:  Positive for constipation and heartburn.  All other systems reviewed and are negative.     Objective:   Physical Exam Vitals reviewed.  Constitutional:      General: She is not in acute distress.    Appearance: She is well-developed.  HENT:     Head: Normocephalic and atraumatic.     Right Ear: Tympanic membrane normal.     Left Ear: Tympanic membrane normal.  Eyes:     Pupils: Pupils are equal, round, and reactive to light.  Neck:     Thyroid: No thyromegaly.  Cardiovascular:     Rate and Rhythm: Normal rate and regular rhythm.     Heart sounds: Normal heart sounds. No murmur heard. Pulmonary:     Effort: Pulmonary effort is normal. No respiratory distress.     Breath sounds: Normal breath sounds. No wheezing.  Abdominal:     General: Bowel sounds are normal. There is no distension.     Palpations: Abdomen is soft.     Tenderness: There is no abdominal tenderness.  Musculoskeletal:        General: No tenderness. Normal range of motion.     Cervical back: Normal range of motion and neck supple.  Skin:    General: Skin is warm and dry.  Neurological:     Mental Status: She is alert and oriented to person, place, and time.     Cranial Nerves: No cranial nerve deficit.     Deep Tendon Reflexes: Reflexes are normal  and symmetric.  Psychiatric:        Behavior: Behavior normal.        Thought Content: Thought content normal.        Judgment: Judgment normal.      BP 126/80   Pulse 75   Temp (!) 97.1 F (36.2 C) (Temporal)   Ht _0  (1.676 m)   Wt 167 lb 9.6 oz (76 kg)   BMI 27.05 kg/m      Assessment & Plan:  Renee [name] comes in today with chief complaint of  Diabetes   Diagnosis and orders addressed:  1. Essential hypertension, benign - CMP14+EGFR  2. Gastroesophageal reflux disease with esophagitis, unspecified whether hemorrhage - CMP14+EGFR  3. Hyperlipidemia associated with type 2 diabetes mellitus (HCC) - CMP14+EGFR  4. Uncontrolled type 2 diabetes mellitus, without long-term current use of insulin (HCC) - CMP14+EGFR - Bayer DCA Hb A1c Waived  5. Overweight (BMI 25.0-29.9) - CMP14+EGFR   Labs pending Health Maintenance reviewed Diet and exercise encouraged  Follow up plan: 3  months    Evelina Dun, FNP

## 2021-09-09 LAB — CMP14+EGFR
ALT: 22 IU/L (ref 0–32)
AST: 29 IU/L (ref 0–40)
Albumin/Globulin Ratio: 1.5 (ref 1.2–2.2)
Albumin: 4.5 g/dL (ref 3.7–4.7)
Alkaline Phosphatase: 83 IU/L (ref 44–121)
BUN/Creatinine Ratio: 22 (ref 12–28)
BUN: 27 mg/dL (ref 8–27)
Bilirubin Total: 0.4 mg/dL (ref 0.0–1.2)
CO2: 24 mmol/L (ref 20–29)
Calcium: 10 mg/dL (ref 8.7–10.3)
Chloride: 103 mmol/L (ref 96–106)
Creatinine, Ser: 1.25 mg/dL — ABNORMAL HIGH (ref 0.57–1.00)
Globulin, Total: 3 g/dL (ref 1.5–4.5)
Glucose: 132 mg/dL — ABNORMAL HIGH (ref 65–99)
Potassium: 4.3 mmol/L (ref 3.5–5.2)
Sodium: 140 mmol/L (ref 134–144)
Total Protein: 7.5 g/dL (ref 6.0–8.5)
eGFR: 46 mL/min/{1.73_m2} — ABNORMAL LOW (ref >59)

## 2021-09-21 ENCOUNTER — Other Ambulatory Visit: Payer: Self-pay | Admitting: Family

## 2021-09-21 DIAGNOSIS — E1142 Type 2 diabetes mellitus with diabetic polyneuropathy: Secondary | ICD-10-CM

## 2021-09-29 ENCOUNTER — Telehealth: Payer: Medicare Other

## 2021-10-22 ENCOUNTER — Other Ambulatory Visit: Payer: Self-pay | Admitting: Family

## 2021-10-22 DIAGNOSIS — I152 Hypertension secondary to endocrine disorders: Secondary | ICD-10-CM

## 2021-10-22 DIAGNOSIS — E1159 Type 2 diabetes mellitus with other circulatory complications: Secondary | ICD-10-CM

## 2021-11-05 DIAGNOSIS — M79676 Pain in unspecified toe(s): Secondary | ICD-10-CM | POA: Diagnosis not present

## 2021-11-05 DIAGNOSIS — E1151 Type 2 diabetes mellitus with diabetic peripheral angiopathy without gangrene: Secondary | ICD-10-CM | POA: Diagnosis not present

## 2021-11-05 DIAGNOSIS — B351 Tinea unguium: Secondary | ICD-10-CM | POA: Diagnosis not present

## 2021-11-05 DIAGNOSIS — L84 Corns and callosities: Secondary | ICD-10-CM | POA: Diagnosis not present

## 2021-11-06 IMAGING — CT CT HEAD W/O CM
3 series · 16 of 46 positions shown, 19 images · non-contrast
Comparison: 01/15/2016

CLINICAL DATA: Weakness, hypertension

EXAM:
CT HEAD WITHOUT CONTRAST
TECHNIQUE: Contiguous axial images were obtained from the base of the skull
through the vertex without intravenous contrast.

[Series 2: head w o · axial · 0.47mm/px · z∈[+116,+236]mm · 10 of 29 slices shown, 13 images]
[im 3/29  brain]
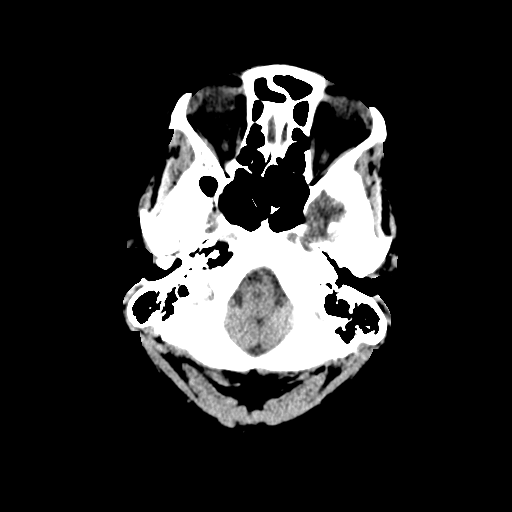
[im 3/29  bone]
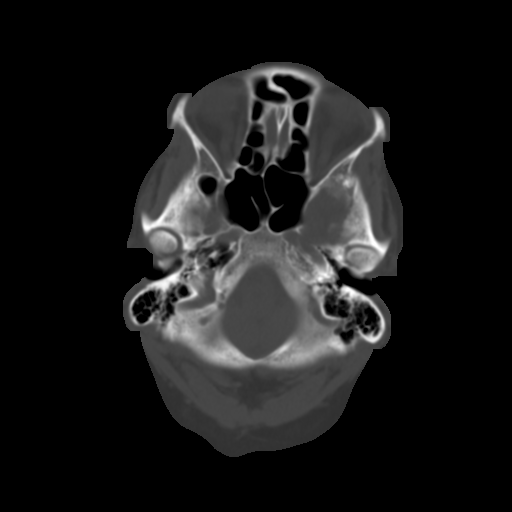
[im 6/29  brain]
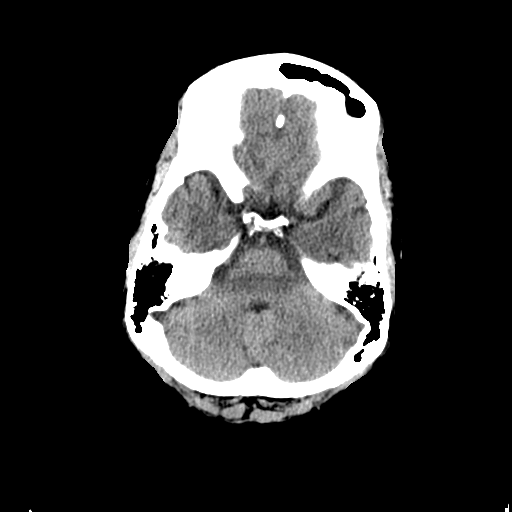
[im 8/29  brain]
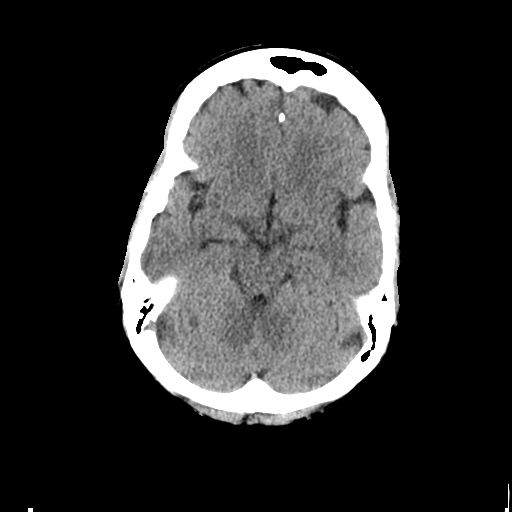
[im 11/29  brain]
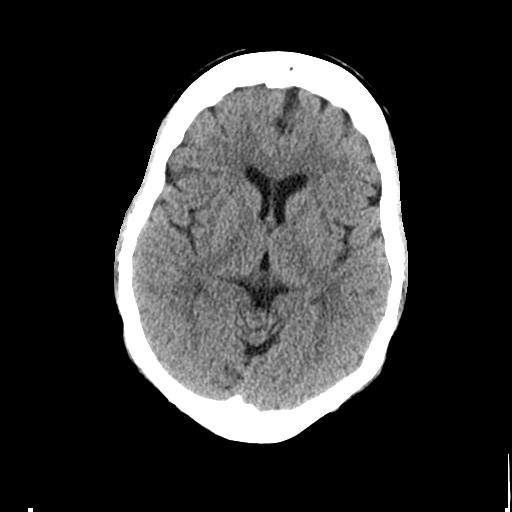
[im 14/29  brain]
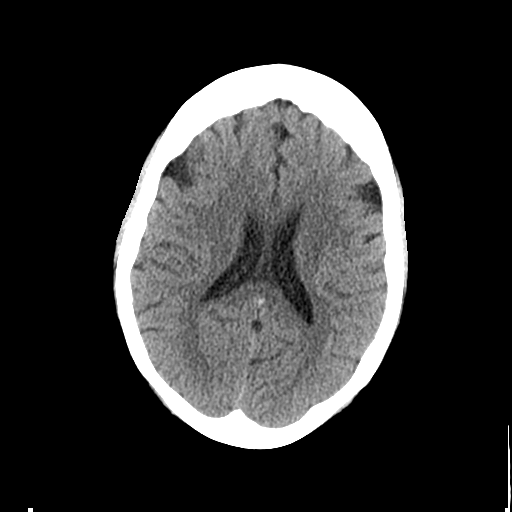
[im 14/29  bone]
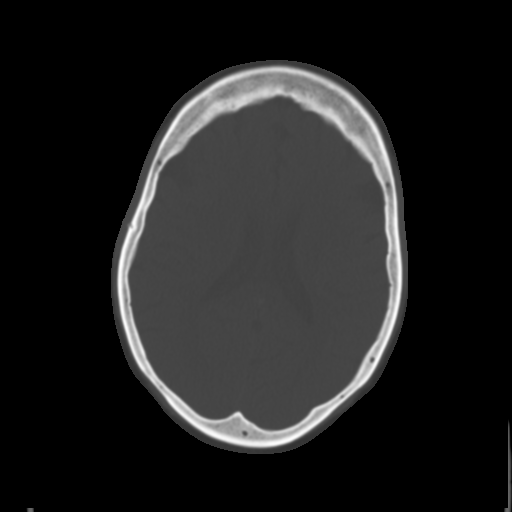
[im 16/29  brain]
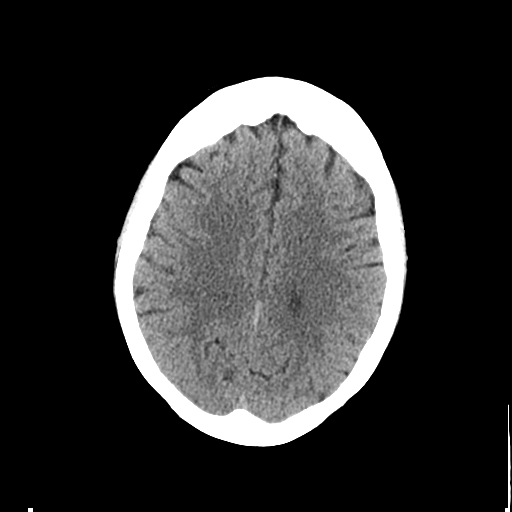
[im 19/29  brain]
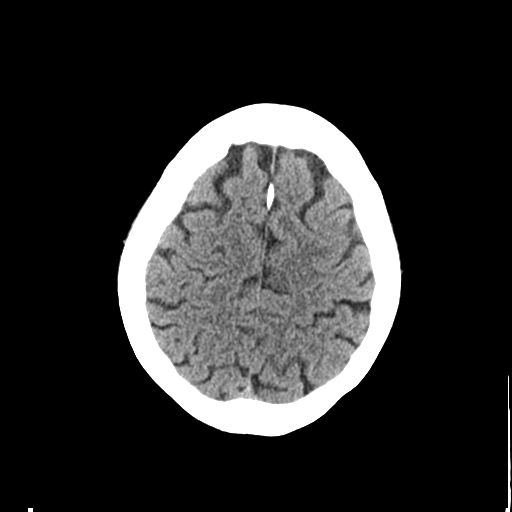
[im 22/29  brain]
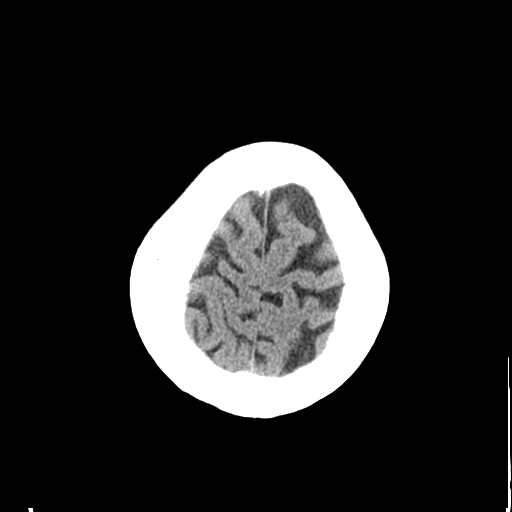
[im 24/29  brain]
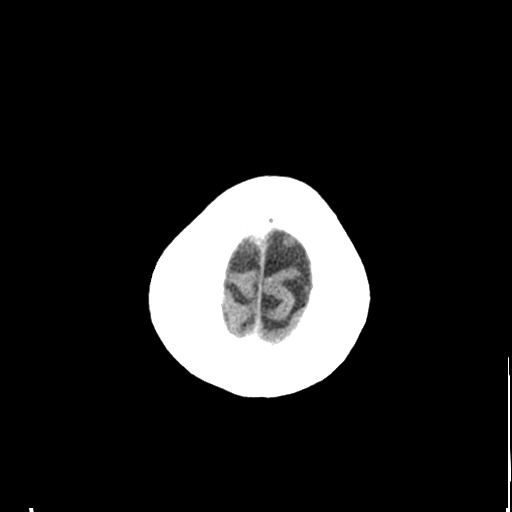
[im 24/29  bone]
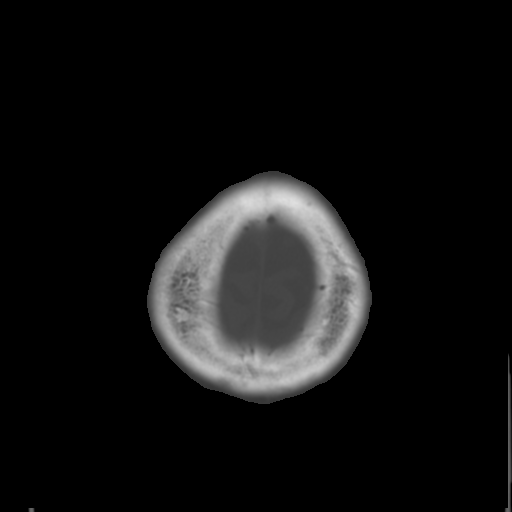
[im 27/29  brain]
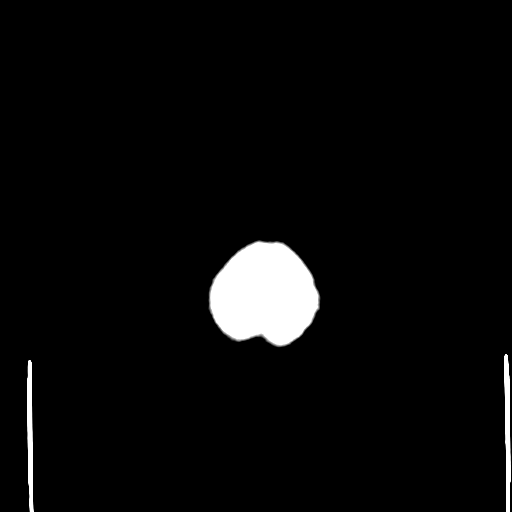

[Series 4: coronal soft · coronal · 0.30mm/px · 3 of 70 slices shown]
[im 24/70  brain]
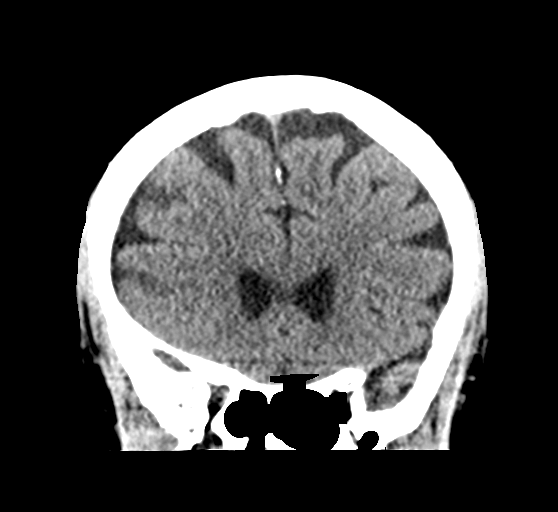
[im 31/70  brain]
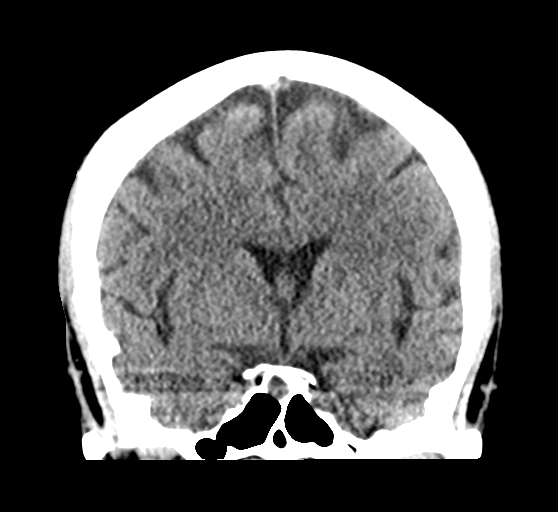
[im 39/70  brain]
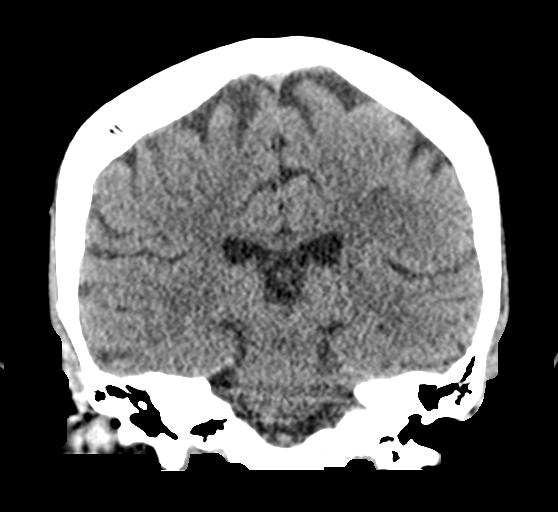

[Series 5: sagittal soft · sagittal · 0.31mm/px · 3 of 57 slices shown]
[im 19/57  brain]
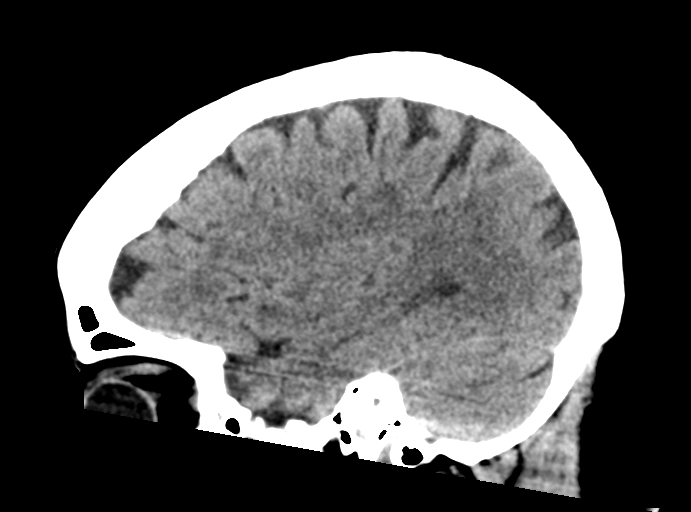
[im 29/57  brain]
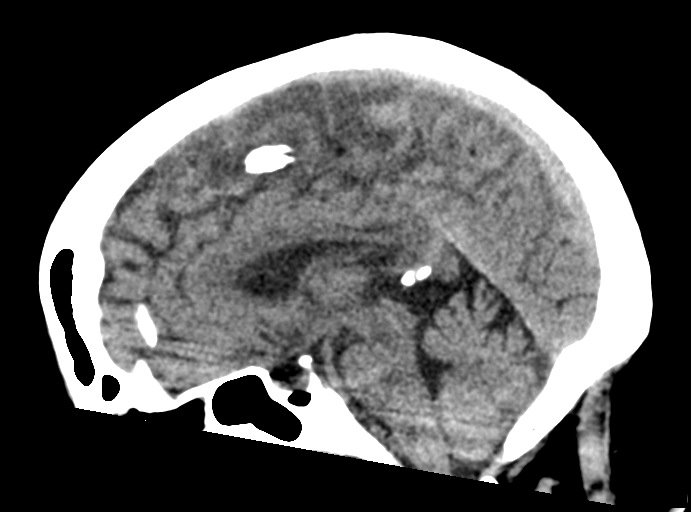
[im 38/57  brain]
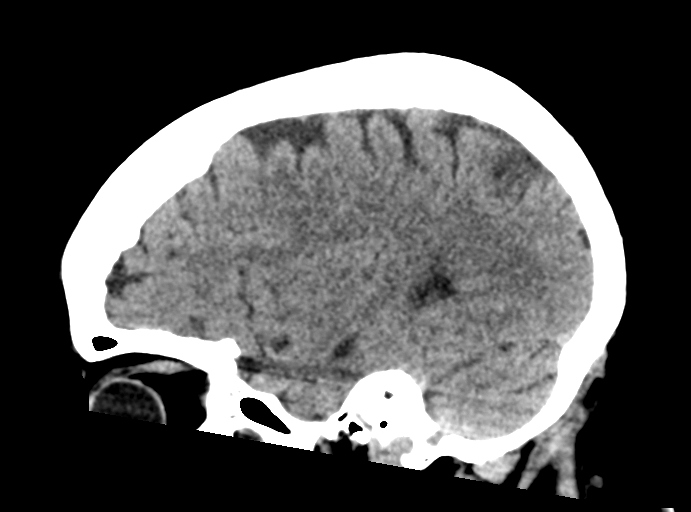

[16 of 46 positions shown; findings below may reference images not displayed]

FINDINGS: Brain: No evidence of acute infarction, hemorrhage, hydrocephalus,
extra-axial collection or mass lesion/mass effect.

Vascular: No hyperdense vessel or unexpected calcification.

Skull: Normal. Negative for fracture or focal lesion.

Sinuses/Orbits: No acute finding.

Other: None.
IMPRESSION: No acute intracranial findings.

## 2021-11-09 ENCOUNTER — Other Ambulatory Visit: Payer: Self-pay | Admitting: Family

## 2021-11-13 ENCOUNTER — Other Ambulatory Visit: Payer: Self-pay | Admitting: Family

## 2021-11-21 ENCOUNTER — Other Ambulatory Visit: Payer: Self-pay | Admitting: Family Medicine

## 2021-11-24 ENCOUNTER — Other Ambulatory Visit: Payer: Self-pay | Admitting: Family Medicine

## 2021-12-08 ENCOUNTER — Telehealth: Payer: Self-pay | Admitting: Family

## 2021-12-08 NOTE — Telephone Encounter (Signed)
Pt called stating that she has been out of her Wilder Glade since November and went to the pharmacy to get a refill and was told that she would have to pay $142 to get it filled because she is in donut hole with insurance. Pt cant afford to fill the Rx.  Needs advise.

## 2021-12-08 NOTE — Telephone Encounter (Signed)
Samples up front have no way of reaching patient number is not working

## 2021-12-10 NOTE — Telephone Encounter (Signed)
Patient picked up medication

## 2021-12-17 ENCOUNTER — Other Ambulatory Visit: Payer: Self-pay | Admitting: Family

## 2021-12-17 DIAGNOSIS — E1159 Type 2 diabetes mellitus with other circulatory complications: Secondary | ICD-10-CM

## 2022-01-05 ENCOUNTER — Telehealth: Payer: Self-pay | Admitting: Family

## 2022-01-05 NOTE — Telephone Encounter (Signed)
Patient aware and verbalized understanding. °

## 2022-01-05 NOTE — Telephone Encounter (Signed)
Samples up front patient aware  

## 2022-01-05 NOTE — Telephone Encounter (Signed)
Pt called requesting samples of Wilder Glade because she only has 1 pill left and insurance wont pay for her Rx because she is in the donut hole.  Please advise and call patient regarding samples.

## 2022-01-06 NOTE — Telephone Encounter (Signed)
Will close encounter

## 2022-01-11 ENCOUNTER — Other Ambulatory Visit: Payer: Self-pay | Admitting: Family

## 2022-01-14 DIAGNOSIS — L84 Corns and callosities: Secondary | ICD-10-CM | POA: Diagnosis not present

## 2022-01-14 DIAGNOSIS — B351 Tinea unguium: Secondary | ICD-10-CM | POA: Diagnosis not present

## 2022-01-14 DIAGNOSIS — E1151 Type 2 diabetes mellitus with diabetic peripheral angiopathy without gangrene: Secondary | ICD-10-CM | POA: Diagnosis not present

## 2022-01-14 DIAGNOSIS — M79676 Pain in unspecified toe(s): Secondary | ICD-10-CM | POA: Diagnosis not present

## 2022-01-30 ENCOUNTER — Other Ambulatory Visit: Payer: Self-pay | Admitting: Family

## 2022-01-30 DIAGNOSIS — K2100 Gastro-esophageal reflux disease with esophagitis, without bleeding: Secondary | ICD-10-CM

## 2022-02-01 ENCOUNTER — Other Ambulatory Visit: Payer: Self-pay | Admitting: Family Medicine

## 2022-02-01 MED ORDER — DAPAGLIFLOZIN PROPANEDIOL 10 MG PO TABS: 10.0000 mg | ORAL_TABLET | Freq: Every day | ORAL | 3 refills | Status: DC

## 2022-02-03 ENCOUNTER — Other Ambulatory Visit: Payer: Self-pay | Admitting: Family

## 2022-02-03 DIAGNOSIS — K2100 Gastro-esophageal reflux disease with esophagitis, without bleeding: Secondary | ICD-10-CM

## 2022-02-12 ENCOUNTER — Telehealth: Payer: Self-pay | Admitting: Pharmacist

## 2022-02-12 DIAGNOSIS — I152 Hypertension secondary to endocrine disorders: Secondary | ICD-10-CM

## 2022-02-12 DIAGNOSIS — E1159 Type 2 diabetes mellitus with other circulatory complications: Secondary | ICD-10-CM

## 2022-02-12 MED ORDER — DAPAGLIFLOZIN PROPANEDIOL 10 MG PO TABS: 10.0000 mg | ORAL_TABLET | Freq: Every day | ORAL | 3 refills | Status: DC

## 2022-02-12 NOTE — Telephone Encounter (Signed)
Az&me patient assistance refills sent for farxiga to [facility] (escribed to patient assistance pharmacy)

## 2022-02-14 ENCOUNTER — Other Ambulatory Visit: Payer: Self-pay | Admitting: Family

## 2022-02-22 ENCOUNTER — Telehealth: Payer: Self-pay | Admitting: Family

## 2022-02-22 DIAGNOSIS — H40033 Anatomical narrow angle, bilateral: Secondary | ICD-10-CM | POA: Diagnosis not present

## 2022-02-22 DIAGNOSIS — E119 Type 2 diabetes mellitus without complications: Secondary | ICD-10-CM | POA: Diagnosis not present

## 2022-02-22 LAB — HM DIABETES EYE EXAM

## 2022-02-23 NOTE — Telephone Encounter (Signed)
Patient aware and verbalized understanding. °

## 2022-02-23 NOTE — Telephone Encounter (Signed)
She was approved for farxiga via az&me patient assistance; she can call [phone] if she needs her farxiga (it should auto ship in 7-10 business days)  Januvia will take awhile--that one has to be hand mailed--she should get that application sent back to her address in 1 month--then she will have to sign and send back (bring it to me if she doesn't understand the instructions--they can be difficult to understand)

## 2022-03-08 ENCOUNTER — Encounter: Payer: Self-pay | Admitting: Family

## 2022-03-08 ENCOUNTER — Ambulatory Visit (INDEPENDENT_AMBULATORY_CARE_PROVIDER_SITE_OTHER): Payer: Medicare Other | Admitting: Family

## 2022-03-08 VITALS — BP 133/72 | HR 79 | Temp 97.3°F | Ht 66.0 in | Wt 167.4 lb

## 2022-03-08 DIAGNOSIS — E1169 Type 2 diabetes mellitus with other specified complication: Secondary | ICD-10-CM | POA: Diagnosis not present

## 2022-03-08 DIAGNOSIS — I152 Hypertension secondary to endocrine disorders: Secondary | ICD-10-CM

## 2022-03-08 DIAGNOSIS — E1159 Type 2 diabetes mellitus with other circulatory complications: Secondary | ICD-10-CM

## 2022-03-08 DIAGNOSIS — E1142 Type 2 diabetes mellitus with diabetic polyneuropathy: Secondary | ICD-10-CM

## 2022-03-08 DIAGNOSIS — E785 Hyperlipidemia, unspecified: Secondary | ICD-10-CM | POA: Diagnosis not present

## 2022-03-08 DIAGNOSIS — E663 Overweight: Secondary | ICD-10-CM

## 2022-03-08 DIAGNOSIS — K2100 Gastro-esophageal reflux disease with esophagitis, without bleeding: Secondary | ICD-10-CM

## 2022-03-08 DIAGNOSIS — Z91199 Patient's noncompliance with other medical treatment and regimen due to unspecified reason: Secondary | ICD-10-CM | POA: Diagnosis not present

## 2022-03-08 DIAGNOSIS — Z8673 Personal history of transient ischemic attack (TIA), and cerebral infarction without residual deficits: Secondary | ICD-10-CM

## 2022-03-08 LAB — BAYER DCA HB A1C WAIVED: HB A1C (BAYER DCA - WAIVED): 14 % — ABNORMAL HIGH (ref 4.8–5.6)

## 2022-03-08 NOTE — Patient Instructions (Signed)

## 2022-03-08 NOTE — Progress Notes (Signed)
? ?Subjective:  ? ? Patient ID: Renee [name], female    DOB: 1951/01/20, 71 y.o.   MRN: [id number] ? ?Chief Complaint  ?Patient presents with  ? Medical Management of Chronic Issues  ? ?Pt presents to the office today for follow up. Her DM is uncontrolled and is followed by Podiatry every 8 weeks. She has seen Endocrinologists, but states she has not seen them in awhile because of transportation. ? ?She refuses any medication changes because she is scared of side effects.  ?Hypertension ?This is a chronic problem. The current episode started more than 1 year ago. The problem has been resolved since onset. The problem is controlled. Associated symptoms include blurred vision and malaise/fatigue. Pertinent negatives include no peripheral edema or shortness of breath. Risk factors for coronary artery disease include dyslipidemia, diabetes mellitus, obesity and sedentary lifestyle. The current treatment provides moderate improvement. Hypertensive end-organ damage includes CVA.  ?Gastroesophageal Reflux ?She complains of belching and heartburn. This is a chronic problem. The current episode started more than 1 year ago. The problem occurs occasionally. She has tried a PPI for the symptoms. The treatment provided moderate relief.  ?Hyperlipidemia ?This is a chronic problem. The current episode started more than 1 year ago. The problem is controlled. Pertinent negatives include no shortness of breath. Current antihyperlipidemic treatment includes statins. The current treatment provides moderate improvement of lipids. Risk factors for coronary artery disease include dyslipidemia, diabetes mellitus, hypertension, a sedentary lifestyle and post-menopausal.  ?Diabetes ?She presents for her follow-up diabetic visit. She has type 2 diabetes mellitus. There are no hypoglycemic associated symptoms. Associated symptoms include blurred vision and foot paresthesias. Symptoms are worsening. Diabetic complications include a CVA. Risk  factors for coronary artery disease include dyslipidemia, diabetes mellitus, hypertension, sedentary lifestyle and post-menopausal. Her overall blood glucose range is >200 mg/dl. An ACE inhibitor/angiotensin II receptor blocker is being taken. Eye exam is current.  ? ? ? ?Review of Systems  ?Constitutional:  Positive for malaise/fatigue.  ?Eyes:  Positive for blurred vision.  ?Respiratory:  Negative for shortness of breath.   ?Gastrointestinal:  Positive for heartburn.  ?All other systems reviewed and are negative. ? ?   ?Objective:  ? Physical Exam ?Vitals reviewed.  ?Constitutional:   ?   General: She is not in acute distress. ?   Appearance: She is well-developed.  ?HENT:  ?   Head: Normocephalic and atraumatic.  ?   Right Ear: Tympanic membrane normal.  ?   Left Ear: Tympanic membrane normal.  ?Eyes:  ?   Pupils: Pupils are equal, round, and reactive to light.  ?Neck:  ?   Thyroid: No thyromegaly.  ?Cardiovascular:  ?   Rate and Rhythm: Normal rate and regular rhythm.  ?   Heart sounds: Normal heart sounds. No murmur heard. ?Pulmonary:  ?   Effort: Pulmonary effort is normal. No respiratory distress.  ?   Breath sounds: Normal breath sounds. No wheezing.  ?Abdominal:  ?   General: Bowel sounds are normal. There is no distension.  ?   Palpations: Abdomen is soft.  ?   Tenderness: There is no abdominal tenderness.  ?Musculoskeletal:     ?   General: No tenderness. Normal range of motion.  ?   Cervical back: Normal range of motion and neck supple.  ?Skin: ?   General: Skin is warm and dry.  ?Neurological:  ?   Mental Status: She is alert and oriented to person, place, and time.  ?   Cranial  Nerves: No cranial nerve deficit.  ?   Deep Tendon Reflexes: Reflexes are normal and symmetric.  ?Psychiatric:     ?   Behavior: Behavior normal.     ?   Thought Content: Thought content normal.     ?   Judgment: Judgment normal.  ? ? ? ?Diabetic Foot Exam - Simple   ?Simple Foot Form ?Diabetic Foot exam was performed with the  following findings: Yes 03/08/2022  9:07 AM  ?Visual Inspection ?No deformities, no ulcerations, no other skin breakdown bilaterally: Yes ?Sensation Testing ?Intact to touch and monofilament testing bilaterally: Yes ?Pulse Check ?Posterior Tibialis and Dorsalis pulse intact bilaterally: Yes ?Comments ?Toenails thick, but short ?  ? ? ?BP 133/72   Pulse 79   Temp (!) 97.3 ?F (36.3 ?C) (Temporal)   Ht '5\' 6"'  (1.676 m)   Wt 167 lb 6.4 oz (75.9 kg)   BMI 27.02 kg/m?  ? ?   ?Assessment & Plan:  ?Renee [name] comes in today with chief complaint of Medical Management of Chronic Issues ? ? ?Diagnosis and orders addressed: ? ?1. Type 2 diabetes mellitus with diabetic polyneuropathy, without long-term current use of insulin ([place]) ? ?- Bayer DCA Hb A1c Waived ?- CMP14+EGFR ?- CBC with Differential/Platelet ? ?2. Hyperlipidemia associated with type 2 diabetes mellitus ([place]) ?- CMP14+EGFR ?- CBC with Differential/Platelet ? ?3. Gastroesophageal reflux disease with esophagitis, unspecified whether hemorrhage ?- CMP14+EGFR ?- CBC with Differential/Platelet ? ?4. Hypertension associated with diabetes ([place]) ?- CMP14+EGFR ?- CBC with Differential/Platelet ? ?5. Diabetic peripheral neuropathy ([place]) ?- CMP14+EGFR ?- CBC with Differential/Platelet ? ?6. Overweight (BMI 25.0-29.9) ?- CMP14+EGFR ?- CBC with Differential/Platelet ? ?7. History of CVA (cerebrovascular accident) ?- CMP14+EGFR ?- CBC with Differential/Platelet ? ?8. Non-compliance ?Pt refuses all medication changes. Discussed risks.  ? ? ?Labs pending ?Health Maintenance reviewed ?Diet and exercise encouraged ? ?Follow up plan: ?3 months and needs follow up with Almyra Free ? ? ?Evelina Dun, FNP ? ? ? ?

## 2022-03-09 LAB — CBC WITH DIFFERENTIAL/PLATELET
Basophils Absolute: 0 10*3/uL (ref 0.0–0.2)
Basos: 1 %
EOS (ABSOLUTE): 0.1 10*3/uL (ref 0.0–0.4)
Eos: 2 %
Hematocrit: 35.3 % (ref 34.0–46.6)
Hemoglobin: 11.9 g/dL (ref 11.1–15.9)
Immature Grans (Abs): 0 10*3/uL (ref 0.0–0.1)
Immature Granulocytes: 0 %
Lymphocytes Absolute: 1.9 10*3/uL (ref 0.7–3.1)
Lymphs: 37 %
MCH: 28.7 pg (ref 26.6–33.0)
MCHC: 33.7 g/dL (ref 31.5–35.7)
MCV: 85 fL (ref 79–97)
Monocytes Absolute: 0.5 10*3/uL (ref 0.1–0.9)
Monocytes: 9 %
Neutrophils Absolute: 2.7 10*3/uL (ref 1.4–7.0)
Neutrophils: 51 %
Platelets: 310 10*3/uL (ref 150–450)
RBC: 4.15 x10E6/uL (ref 3.77–5.28)
RDW: 12.1 % (ref 11.7–15.4)
WBC: 5.3 10*3/uL (ref 3.4–10.8)

## 2022-03-09 LAB — CMP14+EGFR
ALT: 25 IU/L (ref 0–32)
AST: 22 IU/L (ref 0–40)
Albumin/Globulin Ratio: 1.6 (ref 1.2–2.2)
Albumin: 4.4 g/dL (ref 3.7–4.7)
Alkaline Phosphatase: 96 IU/L (ref 44–121)
BUN/Creatinine Ratio: 19 (ref 12–28)
BUN: 23 mg/dL (ref 8–27)
Bilirubin Total: 0.4 mg/dL (ref 0.0–1.2)
CO2: 23 mmol/L (ref 20–29)
Calcium: 10.2 mg/dL (ref 8.7–10.3)
Chloride: 99 mmol/L (ref 96–106)
Creatinine, Ser: 1.18 mg/dL — ABNORMAL HIGH (ref 0.57–1.00)
Globulin, Total: 2.8 g/dL (ref 1.5–4.5)
Glucose: 336 mg/dL — ABNORMAL HIGH (ref 70–99)
Potassium: 4.7 mmol/L (ref 3.5–5.2)
Sodium: 136 mmol/L (ref 134–144)
Total Protein: 7.2 g/dL (ref 6.0–8.5)
eGFR: 49 mL/min/{1.73_m2} — ABNORMAL LOW (ref >59)

## 2022-03-10 ENCOUNTER — Telehealth: Payer: Medicare Other

## 2022-03-25 DIAGNOSIS — L84 Corns and callosities: Secondary | ICD-10-CM | POA: Diagnosis not present

## 2022-03-25 DIAGNOSIS — E1151 Type 2 diabetes mellitus with diabetic peripheral angiopathy without gangrene: Secondary | ICD-10-CM | POA: Diagnosis not present

## 2022-03-25 DIAGNOSIS — M79676 Pain in unspecified toe(s): Secondary | ICD-10-CM | POA: Diagnosis not present

## 2022-03-25 DIAGNOSIS — B351 Tinea unguium: Secondary | ICD-10-CM | POA: Diagnosis not present

## 2022-03-26 ENCOUNTER — Telehealth: Payer: Self-pay | Admitting: Family

## 2022-03-26 DIAGNOSIS — I152 Hypertension secondary to endocrine disorders: Secondary | ICD-10-CM

## 2022-03-26 DIAGNOSIS — E1142 Type 2 diabetes mellitus with diabetic polyneuropathy: Secondary | ICD-10-CM

## 2022-03-26 DIAGNOSIS — K2100 Gastro-esophageal reflux disease with esophagitis, without bleeding: Secondary | ICD-10-CM

## 2022-03-26 MED ORDER — GLIPIZIDE 10 MG PO TABS: 10.0000 mg | ORAL_TABLET | Freq: Two times a day (BID) | ORAL | 2 refills | Status: DC

## 2022-03-26 MED ORDER — VALSARTAN-HYDROCHLOROTHIAZIDE 160-25 MG PO TABS: 1.0000 | ORAL_TABLET | Freq: Every day | ORAL | 0 refills | Status: DC

## 2022-03-26 MED ORDER — OMEPRAZOLE 20 MG PO CPDR: 20.0000 mg | DELAYED_RELEASE_CAPSULE | Freq: Every day | ORAL | 0 refills | Status: DC

## 2022-03-26 MED ORDER — DAPAGLIFLOZIN PROPANEDIOL 10 MG PO TABS: 10.0000 mg | ORAL_TABLET | Freq: Every day | ORAL | 0 refills | Status: DC

## 2022-03-26 MED ORDER — GABAPENTIN 400 MG PO CAPS: 400.0000 mg | ORAL_CAPSULE | Freq: Three times a day (TID) | ORAL | 0 refills | Status: DC

## 2022-03-26 MED ORDER — ATORVASTATIN CALCIUM 40 MG PO TABS: 40.0000 mg | ORAL_TABLET | Freq: Every day | ORAL | 0 refills | Status: DC

## 2022-03-26 NOTE — Telephone Encounter (Signed)
Aware refills on all meds sent to [facility], pt will let us know if she gets close to running out before the mail order comes so that we can send in a 30d supply to [place]. ?

## 2022-05-11 ENCOUNTER — Other Ambulatory Visit: Payer: Self-pay | Admitting: Family

## 2022-05-13 ENCOUNTER — Other Ambulatory Visit: Payer: Self-pay | Admitting: Family

## 2022-05-13 DIAGNOSIS — Z1231 Encounter for screening mammogram for malignant neoplasm of breast: Secondary | ICD-10-CM

## 2022-05-25 ENCOUNTER — Ambulatory Visit
Admission: RE | Admit: 2022-05-25 | Discharge: 2022-05-25 | Disposition: A | Discharge status: Home / Self Care | Payer: Medicare Other | Source: Ambulatory Visit | Attending: Family | Admitting: Family

## 2022-05-25 DIAGNOSIS — Z1231 Encounter for screening mammogram for malignant neoplasm of breast: Secondary | ICD-10-CM | POA: Diagnosis not present

## 2022-06-01 ENCOUNTER — Telehealth: Payer: Self-pay | Admitting: Family

## 2022-06-01 DIAGNOSIS — B351 Tinea unguium: Secondary | ICD-10-CM | POA: Diagnosis not present

## 2022-06-01 DIAGNOSIS — L84 Corns and callosities: Secondary | ICD-10-CM | POA: Diagnosis not present

## 2022-06-01 DIAGNOSIS — M79676 Pain in unspecified toe(s): Secondary | ICD-10-CM | POA: Diagnosis not present

## 2022-06-01 DIAGNOSIS — E1151 Type 2 diabetes mellitus with diabetic peripheral angiopathy without gangrene: Secondary | ICD-10-CM | POA: Diagnosis not present

## 2022-06-01 NOTE — Telephone Encounter (Signed)
Noted  

## 2022-06-08 ENCOUNTER — Ambulatory Visit (INDEPENDENT_AMBULATORY_CARE_PROVIDER_SITE_OTHER): Payer: Medicare Other | Admitting: Family

## 2022-06-08 ENCOUNTER — Encounter: Payer: Self-pay | Admitting: Family

## 2022-06-08 VITALS — BP 126/62 | HR 73 | Temp 98.1°F | Ht 66.0 in | Wt 168.8 lb

## 2022-06-08 DIAGNOSIS — E785 Hyperlipidemia, unspecified: Secondary | ICD-10-CM

## 2022-06-08 DIAGNOSIS — E1159 Type 2 diabetes mellitus with other circulatory complications: Secondary | ICD-10-CM

## 2022-06-08 DIAGNOSIS — E1169 Type 2 diabetes mellitus with other specified complication: Secondary | ICD-10-CM

## 2022-06-08 DIAGNOSIS — E1142 Type 2 diabetes mellitus with diabetic polyneuropathy: Secondary | ICD-10-CM | POA: Diagnosis not present

## 2022-06-08 DIAGNOSIS — Z8673 Personal history of transient ischemic attack (TIA), and cerebral infarction without residual deficits: Secondary | ICD-10-CM

## 2022-06-08 DIAGNOSIS — K2100 Gastro-esophageal reflux disease with esophagitis, without bleeding: Secondary | ICD-10-CM

## 2022-06-08 DIAGNOSIS — I152 Hypertension secondary to endocrine disorders: Secondary | ICD-10-CM | POA: Diagnosis not present

## 2022-06-08 DIAGNOSIS — E663 Overweight: Secondary | ICD-10-CM

## 2022-06-08 DIAGNOSIS — Z91199 Patient's noncompliance with other medical treatment and regimen due to unspecified reason: Secondary | ICD-10-CM

## 2022-06-08 LAB — BAYER DCA HB A1C WAIVED: HB A1C (BAYER DCA - WAIVED): 12.6 % — ABNORMAL HIGH (ref 4.8–5.6)

## 2022-06-08 MED ORDER — OMEPRAZOLE 20 MG PO CPDR: 20.0000 mg | DELAYED_RELEASE_CAPSULE | Freq: Every day | ORAL | 1 refills | Status: DC

## 2022-06-08 MED ORDER — VALSARTAN-HYDROCHLOROTHIAZIDE 160-25 MG PO TABS: 1.0000 | ORAL_TABLET | Freq: Every day | ORAL | 1 refills | Status: DC

## 2022-06-08 MED ORDER — ATORVASTATIN CALCIUM 40 MG PO TABS: 40.0000 mg | ORAL_TABLET | Freq: Every day | ORAL | 1 refills | Status: DC

## 2022-06-08 MED ORDER — DAPAGLIFLOZIN PROPANEDIOL 10 MG PO TABS: 10.0000 mg | ORAL_TABLET | Freq: Every day | ORAL | 1 refills | Status: DC

## 2022-06-08 MED ORDER — GABAPENTIN 400 MG PO CAPS: 400.0000 mg | ORAL_CAPSULE | Freq: Three times a day (TID) | ORAL | 1 refills | Status: DC

## 2022-06-08 NOTE — Patient Instructions (Signed)

## 2022-06-08 NOTE — Progress Notes (Signed)
Subjective:    Patient ID: Renee [name], female    DOB: [birth year], 71 y.o.   MRN: [id number]  Chief Complaint  Patient presents with   Medical Management of Chronic Issues   Pt presents to the office today for follow up. Her DM is uncontrolled and is followed by Podiatry every 8 weeks. She has seen Endocrinologists, but states she has not seen them in awhile because of transportation. Her last A1C was >14.   She refuses any medication changes because she is scared of side effects.   She has diabetic neuropathy and takes gabapentin 400 mg TID. States her pain is an aching, burning  pain of 2 out 10.  Hypertension This is a chronic problem. The current episode started more than 1 year ago. The problem has been resolved since onset. The problem is controlled. Pertinent negatives include no blurred vision, malaise/fatigue, peripheral edema or shortness of breath. Risk factors for coronary artery disease include dyslipidemia, diabetes mellitus and sedentary lifestyle. The current treatment provides moderate improvement. Hypertensive end-organ damage includes CVA.  Diabetes She presents for her follow-up diabetic visit. She has type 2 diabetes mellitus. Associated symptoms include foot paresthesias. Pertinent negatives for diabetes include no blurred vision. Diabetic complications include a CVA and peripheral neuropathy. Risk factors for coronary artery disease include dyslipidemia, diabetes mellitus, hypertension, sedentary lifestyle and post-menopausal. She is following a generally unhealthy diet. Her overall blood glucose range is >200 mg/dl. Eye exam is current.  Gastroesophageal Reflux She complains of belching and heartburn. This is a chronic problem. The current episode started more than 1 year ago. The problem has been waxing and waning. She has tried a PPI for the symptoms. The treatment provided moderate relief.  Hyperlipidemia This is a chronic problem. The current episode started more  than 1 year ago. Exacerbating diseases include obesity. Pertinent negatives include no shortness of breath. Current antihyperlipidemic treatment includes statins. The current treatment provides moderate improvement of lipids. Risk factors for coronary artery disease include dyslipidemia, diabetes mellitus, hypertension, a sedentary lifestyle and post-menopausal.      Review of Systems  Constitutional:  Negative for malaise/fatigue.  Eyes:  Negative for blurred vision.  Respiratory:  Negative for shortness of breath.   Gastrointestinal:  Positive for heartburn.  All other systems reviewed and are negative.      Objective:   Physical Exam Vitals reviewed.  Constitutional:      General: She is not in acute distress.    Appearance: She is well-developed. She is obese.  HENT:     Head: Normocephalic and atraumatic.     Right Ear: Tympanic membrane normal.     Left Ear: Tympanic membrane normal.  Eyes:     Pupils: Pupils are equal, round, and reactive to light.  Neck:     Thyroid: No thyromegaly.  Cardiovascular:     Rate and Rhythm: Normal rate and regular rhythm.     Heart sounds: Normal heart sounds. No murmur heard. Pulmonary:     Effort: Pulmonary effort is normal. No respiratory distress.     Breath sounds: Normal breath sounds. No wheezing.  Abdominal:     General: Bowel sounds are normal. There is no distension.     Palpations: Abdomen is soft.     Tenderness: There is no abdominal tenderness.  Musculoskeletal:        General: No tenderness. Normal range of motion.     Cervical back: Normal range of motion and neck supple.  Skin:  General: Skin is warm and dry.  Neurological:     Mental Status: She is alert and oriented to person, place, and time.     Cranial Nerves: No cranial nerve deficit.     Deep Tendon Reflexes: Reflexes are normal and symmetric.  Psychiatric:        Behavior: Behavior normal.        Thought Content: Thought content normal.        Judgment:  Judgment normal.          BP 126/62   Pulse 73   Temp 98.1 F (36.7 C) (Temporal)   Ht _0  (1.676 m)   Wt 168 lb 12.8 oz (76.6 kg)   SpO2 99%   BMI 27.25 kg/m   Assessment & Plan:  DARRAH DREDGE comes in today with chief complaint of Medical Management of Chronic Issues   Diagnosis and orders addressed:  1. Diabetic peripheral neuropathy (HCC) - gabapentin (NEURONTIN) 400 MG capsule; Take 1 capsule (400 mg total) by mouth 3 (three) times daily.  Dispense: 270 capsule; Refill: 1 - CMP14+EGFR  2. Gastroesophageal reflux disease with esophagitis - omeprazole (PRILOSEC) 20 MG capsule; Take 1 capsule (20 mg total) by mouth daily.  Dispense: 90 capsule; Refill: 1 - CMP14+EGFR  3. Hypertension associated with diabetes ([place]) - CMP14+EGFR  4. Gastroesophageal reflux disease with esophagitis, unspecified whether hemorrhage - CMP14+EGFR  5. Type 2 diabetes mellitus with diabetic polyneuropathy, without long-term current use of insulin (HCC) - dapagliflozin propanediol (FARXIGA) 10 MG TABS tablet; Take 1 tablet (10 mg total) by mouth daily before breakfast.  Dispense: 90 tablet; Refill: 1 - CMP14+EGFR - Bayer DCA Hb A1c Waived  6. Hyperlipidemia associated with type 2 diabetes mellitus (HCC) - atorvastatin (LIPITOR) 40 MG tablet; Take 1 tablet (40 mg total) by mouth daily.  Dispense: 90 tablet; Refill: 1 - CMP14+EGFR  7. Overweight (BMI 25.0-29.9) - CMP14+EGFR  8. Non-compliance Refuses medication changes. DM is uncontrolled - CMP14+EGFR  9. History of CVA (cerebrovascular accident) - CMP14+EGFR   Labs pending Health Maintenance reviewed Diet and exercise encouraged  Follow up plan: 3 months and need follow up Clinical pharm for more education    Evelina Dun, [place]

## 2022-06-09 LAB — CMP14+EGFR
ALT: 20 IU/L (ref 0–32)
AST: 23 IU/L (ref 0–40)
Albumin/Globulin Ratio: 1.5 (ref 1.2–2.2)
Albumin: 4.4 g/dL (ref 3.7–4.7)
Alkaline Phosphatase: 89 IU/L (ref 44–121)
BUN/Creatinine Ratio: 19 (ref 12–28)
BUN: 26 mg/dL (ref 8–27)
Bilirubin Total: 0.3 mg/dL (ref 0.0–1.2)
CO2: 21 mmol/L (ref 20–29)
Calcium: 10.2 mg/dL (ref 8.7–10.3)
Chloride: 100 mmol/L (ref 96–106)
Creatinine, Ser: 1.34 mg/dL — ABNORMAL HIGH (ref 0.57–1.00)
Globulin, Total: 3 g/dL (ref 1.5–4.5)
Glucose: 213 mg/dL — ABNORMAL HIGH (ref 70–99)
Potassium: 4.4 mmol/L (ref 3.5–5.2)
Sodium: 139 mmol/L (ref 134–144)
Total Protein: 7.4 g/dL (ref 6.0–8.5)
eGFR: 42 mL/min/{1.73_m2} — ABNORMAL LOW (ref >59)

## 2022-06-13 ENCOUNTER — Other Ambulatory Visit: Payer: Self-pay | Admitting: Family

## 2022-06-13 DIAGNOSIS — E1142 Type 2 diabetes mellitus with diabetic polyneuropathy: Secondary | ICD-10-CM

## 2022-06-13 DIAGNOSIS — E785 Hyperlipidemia, unspecified: Secondary | ICD-10-CM

## 2022-06-13 DIAGNOSIS — E1169 Type 2 diabetes mellitus with other specified complication: Secondary | ICD-10-CM

## 2022-06-13 DIAGNOSIS — K2100 Gastro-esophageal reflux disease with esophagitis, without bleeding: Secondary | ICD-10-CM

## 2022-06-24 ENCOUNTER — Ambulatory Visit (INDEPENDENT_AMBULATORY_CARE_PROVIDER_SITE_OTHER): Payer: Medicare Other | Admitting: Pharmacist

## 2022-06-24 DIAGNOSIS — E1142 Type 2 diabetes mellitus with diabetic polyneuropathy: Secondary | ICD-10-CM

## 2022-06-24 NOTE — Progress Notes (Signed)
    06/24/2022 Name: Renee [name] MRN: [id number] DOB: 02-06-1951   S:  40 yof Presents for follow up diabetes evaluation, education, and management Patient was referred and last seen by Primary Care Provider on 06/08/22.  Her A1c has remained uncontrolled for years.  She has been reluctant to make changes to her medications due to cost and uncertainty.    Insurance coverage/medication affordability: [employer] medicare Medication assistance for farxiga via az[employer]me PAP Medication assistance for [place] via [employer] PAP   Patient reports adherence with medications. Current diabetes medications include: farxiga, januvia, glipizide Intolerances: metformin   Patient denies hypoglycemic events.   Discussed meal planning options and Plate method for healthy eating Avoid sugary drinks and desserts Incorporate balanced protein, non starchy veggies, 1 serving of carbohydrate with each meal Increase water intake Increase physical activity as able   Patient-reported exercise habits: she reports she is trying to do better/increased walking   Patient reports nocturia (nighttime urination).  Patient reports neuropathy (nerve pain).  Patient unknown visual changes.  Patient reports self foot exams.    O:  Lab Results  Component Value Date   HGBA1C 12.6 (H) 06/08/2022    Lipid Panel     Component Value Date/Time   CHOL 151 07/06/2021 1056   TRIG 157 (H) 07/06/2021 1056   HDL 36 (L) 07/06/2021 1056   CHOLHDL 4.2 07/06/2021 1056   LDLCALC 88 07/06/2021 1056     Home fasting blood sugars: 200s  2 hour post-meal/random blood sugars: 300s.    Clinical Atherosclerotic Cardiovascular Disease (ASCVD): No   The 10-year ASCVD risk score (Arnett DK, et al., 2019) is: 20.7%   Values used to calculate the score:     Age: 71 years     Sex: Female     Is Non-Hispanic African American: Yes     Diabetic: Yes     Tobacco smoker: No     Systolic Blood Pressure: 409 mmHg     Is BP treated:  Yes     HDL Cholesterol: 36 mg/dL     Total Cholesterol: 151 mg/dL    A/P:  Diabetes T2DM uncontrolled (a1c 12.6%).  Discussed multiple options with patients including GLP1/GIP therapy and insulin.  Patient respectfully declined all options presented for T2DM therapy.  I discussed the potential risks/harm in allowing sugar to be uncontrolled for years.   Patient to continue on current medications as desired.  Encouraged patient to reach back out to PCP when she is ready to escalate therapy.  -Extensively discussed pathophysiology of diabetes, recommended lifestyle interventions, dietary effects on blood sugar control  -Counseled on s/sx of and management of hypoglycemia  -Next A1C anticipated 3 months.    Written patient instructions provided.  Total time in face to face counseling 25 minutes.     Regina Eck, PharmD, BCPS Clinical Pharmacist, [place]  [facility] Phone [phone]

## 2022-07-06 ENCOUNTER — Other Ambulatory Visit: Payer: Self-pay | Admitting: Family

## 2022-07-06 DIAGNOSIS — E1142 Type 2 diabetes mellitus with diabetic polyneuropathy: Secondary | ICD-10-CM

## 2022-07-13 ENCOUNTER — Telehealth: Payer: Self-pay | Admitting: Family

## 2022-07-13 DIAGNOSIS — K2100 Gastro-esophageal reflux disease with esophagitis, without bleeding: Secondary | ICD-10-CM

## 2022-07-13 DIAGNOSIS — E1142 Type 2 diabetes mellitus with diabetic polyneuropathy: Secondary | ICD-10-CM

## 2022-07-13 DIAGNOSIS — E1169 Type 2 diabetes mellitus with other specified complication: Secondary | ICD-10-CM

## 2022-07-13 DIAGNOSIS — E1159 Type 2 diabetes mellitus with other circulatory complications: Secondary | ICD-10-CM

## 2022-07-13 NOTE — Telephone Encounter (Signed)
  Prescription Request  07/13/2022  Is this a "Controlled Substance" medicine? no  Have you seen your PCP in the last 2 weeks? Pt last seen in June has appt with PCP in September   If YES, route message to pool  -  If NO, patient needs to be scheduled for appointment.  What is the name of the medication or equipment? gabapentin (NEURONTIN) 400 MG capsule  atorvastatin (LIPITOR) 40 MG tablet  dapagliflozin propanediol (FARXIGA) 10 MG TABS tablet  valsartan-hydrochlorothiazide (DIOVAN-HCT) 160-25 MG tablet glipiZIDE (GLUCOTROL) 10 MG tablet omeprazole (PRILOSEC) 20 MG capsule   Have you contacted your pharmacy to request a refill? yes   Which pharmacy would you like this sent to? [facility]  Last refills were sent to wrong pharmacy  Patient notified that their request is being sent to the clinical staff for review and that they should receive a response within 2 business days.

## 2022-07-21 MED ORDER — DAPAGLIFLOZIN PROPANEDIOL 10 MG PO TABS: 10.0000 mg | ORAL_TABLET | Freq: Every day | ORAL | 1 refills | Status: DC

## 2022-07-21 MED ORDER — GLIPIZIDE 10 MG PO TABS: 10.0000 mg | ORAL_TABLET | Freq: Two times a day (BID) | ORAL | 2 refills | Status: DC

## 2022-07-21 MED ORDER — ATORVASTATIN CALCIUM 40 MG PO TABS: 40.0000 mg | ORAL_TABLET | Freq: Every day | ORAL | 1 refills | Status: DC

## 2022-07-21 MED ORDER — OMEPRAZOLE 20 MG PO CPDR: 20.0000 mg | DELAYED_RELEASE_CAPSULE | Freq: Every day | ORAL | 1 refills | Status: DC

## 2022-07-21 MED ORDER — GABAPENTIN 400 MG PO CAPS: 400.0000 mg | ORAL_CAPSULE | Freq: Three times a day (TID) | ORAL | 1 refills | Status: DC

## 2022-07-21 MED ORDER — VALSARTAN-HYDROCHLOROTHIAZIDE 160-25 MG PO TABS: 1.0000 | ORAL_TABLET | Freq: Every day | ORAL | 1 refills | Status: DC

## 2022-07-21 NOTE — Telephone Encounter (Signed)
Pt aware refills sent to [facility], she no longer uses [facility], I took this out of her pharmacy list.

## 2022-07-21 NOTE — Addendum Note (Signed)
Addended by: Antonietta Barcelona D on: 07/21/2022 03:45 PM   Modules accepted: Orders

## 2022-07-21 NOTE — Telephone Encounter (Signed)
Patient calling to check on this because she is almost out of medication.

## 2022-07-23 ENCOUNTER — Ambulatory Visit (INDEPENDENT_AMBULATORY_CARE_PROVIDER_SITE_OTHER): Payer: Medicare Other

## 2022-07-23 VITALS — Wt 168.0 lb

## 2022-07-23 DIAGNOSIS — Z0000 Encounter for general adult medical examination without abnormal findings: Secondary | ICD-10-CM | POA: Diagnosis not present

## 2022-07-23 NOTE — Patient Instructions (Signed)
Renee [name] , Thank you for taking time to come for your Medicare Wellness Visit. I appreciate your ongoing commitment to your health goals. Please review the following plan we discussed and let me know if I can assist you in the future.   Screening recommendations/referrals: Colonoscopy: Done 08/17/2012 - Repeat in 10 years  Mammogram: Done 05/25/2022 - Repeat annually  Bone Density: Done 12/26/2015 - recommend repeats every 2-5 years -*get at next visit Recommended yearly ophthalmology/optometry visit for glaucoma screening and checkup Recommended yearly dental visit for hygiene and checkup  Vaccinations: declines all Influenza vaccine: recommend every Fall Pneumococcal vaccine: recommend once per lifetime Prevnar-20 Tdap vaccine: recommend every 10 years Shingles vaccine: recommend Shingrix which is 2 doses 2-6 months apart and over 90% effective     Covid-19: recommend 2 doses one month apart with a booster 6 months later   Advanced directives: in chart  Conditions/risks identified: Aim for 30 minutes of exercise or brisk walking, 6-8 glasses of water, and 5 servings of fruits and vegetables each day.   Next appointment: Follow up in one year for your annual wellness visit    Preventive Care 65 Years and Older, Female Preventive care refers to lifestyle choices and visits with your health care provider that can promote health and wellness. What does preventive care include? A yearly physical exam. This is also called an annual well check. Dental exams once or twice a year. Routine eye exams. Ask your health care provider how often you should have your eyes checked. Personal lifestyle choices, including: Daily care of your teeth and gums. Regular physical activity. Eating a healthy diet. Avoiding tobacco and drug use. Limiting alcohol use. Practicing safe sex. Taking low-dose aspirin every day. Taking vitamin and mineral supplements as recommended by your health care  provider. What happens during an annual well check? The services and screenings done by your health care provider during your annual well check will depend on your age, overall health, lifestyle risk factors, and family history of disease. Counseling  Your health care provider may ask you questions about your: Alcohol use. Tobacco use. Drug use. Emotional well-being. Home and relationship well-being. Sexual activity. Eating habits. History of falls. Memory and ability to understand (cognition). Work and work Statistician. Reproductive health. Screening  You may have the following tests or measurements: Height, weight, and BMI. Blood pressure. Lipid and cholesterol levels. These may be checked every 5 years, or more frequently if you are over 15 years old. Skin check. Lung cancer screening. You may have this screening every year starting at age 51 if you have a 30-pack-year history of smoking and currently smoke or have quit within the past 15 years. Fecal occult blood test (FOBT) of the stool. You may have this test every year starting at age 40. Flexible sigmoidoscopy or colonoscopy. You may have a sigmoidoscopy every 5 years or a colonoscopy every 10 years starting at age 51. Hepatitis C blood test. Hepatitis B blood test. Sexually transmitted disease (STD) testing. Diabetes screening. This is done by checking your blood sugar (glucose) after you have not eaten for a while (fasting). You may have this done every 1-3 years. Bone density scan. This is done to screen for osteoporosis. You may have this done starting at age 18. Mammogram. This may be done every 1-2 years. Talk to your health care provider about how often you should have regular mammograms. Talk with your health care provider about your test results, treatment options, and if necessary, the  need for more tests. Vaccines  Your health care provider may recommend certain vaccines, such as: Influenza vaccine. This is  recommended every year. Tetanus, diphtheria, and acellular pertussis (Tdap, Td) vaccine. You may need a Td booster every 10 years. Zoster vaccine. You may need this after age 10. Pneumococcal 13-valent conjugate (PCV13) vaccine. One dose is recommended after age 15. Pneumococcal polysaccharide (PPSV23) vaccine. One dose is recommended after age 82. Talk to your health care provider about which screenings and vaccines you need and how often you need them. This information is not intended to replace advice given to you by your health care provider. Make sure you discuss any questions you have with your health care provider. Document Released: 01/09/2016 Document Revised: 09/01/2016 Document Reviewed: 10/14/2015 Elsevier Interactive Patient Education  2017 [employer] Prevention in the Home Falls can cause injuries. They can happen to people of all ages. There are many things you can do to make your home safe and to help prevent falls. What can I do on the outside of my home? Regularly fix the edges of walkways and driveways and fix any cracks. Remove anything that might make you trip as you walk through a door, such as a raised step or threshold. Trim any bushes or trees on the path to your home. Use bright outdoor lighting. Clear any walking paths of anything that might make someone trip, such as rocks or tools. Regularly check to see if handrails are loose or broken. Make sure that both sides of any steps have handrails. Any raised decks and porches should have guardrails on the edges. Have any leaves, snow, or ice cleared regularly. Use sand or salt on walking paths during winter. Clean up any spills in your garage right away. This includes oil or grease spills. What can I do in the bathroom? Use night lights. Install grab bars by the toilet and in the tub and shower. Do not use towel bars as grab bars. Use non-skid mats or decals in the tub or shower. If you need to sit down in  the shower, use a plastic, non-slip stool. Keep the floor dry. Clean up any water that spills on the floor as soon as it happens. Remove soap buildup in the tub or shower regularly. Attach bath mats securely with double-sided non-slip rug tape. Do not have throw rugs and other things on the floor that can make you trip. What can I do in the bedroom? Use night lights. Make sure that you have a light by your bed that is easy to reach. Do not use any sheets or blankets that are too big for your bed. They should not hang down onto the floor. Have a firm chair that has side arms. You can use this for support while you get dressed. Do not have throw rugs and other things on the floor that can make you trip. What can I do in the kitchen? Clean up any spills right away. Avoid walking on wet floors. Keep items that you use a lot in easy-to-reach places. If you need to reach something above you, use a strong step stool that has a grab bar. Keep electrical cords out of the way. Do not use floor polish or wax that makes floors slippery. If you must use wax, use non-skid floor wax. Do not have throw rugs and other things on the floor that can make you trip. What can I do with my stairs? Do not leave any items on the  stairs. Make sure that there are handrails on both sides of the stairs and use them. Fix handrails that are broken or loose. Make sure that handrails are as long as the stairways. Check any carpeting to make sure that it is firmly attached to the stairs. Fix any carpet that is loose or worn. Avoid having throw rugs at the top or bottom of the stairs. If you do have throw rugs, attach them to the floor with carpet tape. Make sure that you have a light switch at the top of the stairs and the bottom of the stairs. If you do not have them, ask someone to add them for you. What else can I do to help prevent falls? Wear shoes that: Do not have high heels. Have rubber bottoms. Are comfortable  and fit you well. Are closed at the toe. Do not wear sandals. If you use a stepladder: Make sure that it is fully opened. Do not climb a closed stepladder. Make sure that both sides of the stepladder are locked into place. Ask someone to hold it for you, if possible. Clearly mark and make sure that you can see: Any grab bars or handrails. First and last steps. Where the edge of each step is. Use tools that help you move around (mobility aids) if they are needed. These include: Canes. Walkers. Scooters. Crutches. Turn on the lights when you go into a dark area. Replace any light bulbs as soon as they burn out. Set up your furniture so you have a clear path. Avoid moving your furniture around. If any of your floors are uneven, fix them. If there are any pets around you, be aware of where they are. Review your medicines with your doctor. Some medicines can make you feel dizzy. This can increase your chance of falling. Ask your doctor what other things that you can do to help prevent falls. This information is not intended to replace advice given to you by your health care provider. Make sure you discuss any questions you have with your health care provider. Document Released: 10/09/2009 Document Revised: 05/20/2016 Document Reviewed: 01/17/2015 Elsevier Interactive Patient Education  2017 [employer].

## 2022-07-23 NOTE — Progress Notes (Signed)
Subjective:   Renee [name] is a 71 y.o. female who presents for Medicare Annual (Subsequent) preventive examination.  Virtual Visit via Telephone Note  I connected with  Renee [name] on 07/23/22 at  3:30 PM EDT by telephone and verified that I am speaking with the correct person using two identifiers.  Location: Patient: Home Provider: [facility] Persons participating in the virtual visit: patient/Nurse Health Advisor   I discussed the limitations, risks, security and privacy concerns of performing an evaluation and management service by telephone and the availability of in person appointments. The patient expressed understanding and agreed to proceed.  Interactive audio and video telecommunications were attempted between this nurse and patient, however failed, due to patient having technical difficulties OR patient did not have access to video capability.  We continued and completed visit with audio only.  Some vital signs may be absent or patient reported.   Marget Outten E Mackinley Kiehn, LPN   Review of Systems     Cardiac Risk Factors include: advanced age (>39mn, >>36women);diabetes mellitus;dyslipidemia;hypertension;Other (see comment), Risk factor comments: hx of CVA     Objective:    Today's Vitals   07/23/22 1533  Weight: 168 lb (76.2 kg)   Body mass index is 27.12 kg/m.     07/23/2022    3:35 PM 07/22/2021    8:50 AM 05/08/2021   11:23 AM 11/21/2020    7:49 AM 07/21/2020    9:36 AM 06/05/2019   10:59 AM 10/30/2018   12:17 PM  Advanced Directives  Does Patient Have a Medical Advance Directive? Yes No No Unable to assess, patient is non-responsive or altered mental status Yes No No  Type of AParamed[place]of [place] will    [place]   Does patient want to make changes to medical advance directive?     No - Patient declined    Copy of [place] Chart? Yes - validated most recent copy scanned in chart (See row  information)    Yes - validated most recent copy scanned in chart (See row information)    Would patient like information on creating a medical advance directive?  No - Patient declined No - Patient declined   Yes (MAU/Ambulatory/Procedural Areas - Information given) No - Patient declined    Current Medications (verified) Outpatient Encounter Medications as of 07/23/2022  Medication Sig   aspirin 81 MG tablet Take 81 mg by mouth daily.   atorvastatin (LIPITOR) 40 MG tablet Take 1 tablet (40 mg total) by mouth daily.   Blood Glucose Calibration (ONETOUCH VERIO) SOLN Use as needed to calibrate glucometer   Blood Glucose Monitoring Suppl (ONETOUCH VERIO FLEX SYSTEM) w/Device KIT Use to check blood sugar twice daily   dapagliflozin propanediol (FARXIGA) 10 MG TABS tablet Take 1 tablet (10 mg total) by mouth daily before breakfast.   docusate sodium (COLACE) 100 MG capsule Take 100 mg by mouth daily.   gabapentin (NEURONTIN) 400 MG capsule Take 1 capsule (400 mg total) by mouth 3 (three) times daily.   glipiZIDE (GLUCOTROL) 10 MG tablet Take 1 tablet (10 mg total) by mouth 2 (two) times daily before a meal.   glucose blood (ONETOUCH VERIO) test strip USE TO CHECK BLOOD SUGAR TWICE DAILY Dx E11.9   omeprazole (PRILOSEC) 20 MG capsule Take 1 capsule (20 mg total) by mouth daily.   ONETOUCH DELICA LANCETS 354OMISC Use to check BG up to bid.  Dx: E11.65 type 2  DM with insulin therapy, uncontrolled   sitaGLIPtin (JANUVIA) 50 MG tablet Take 50 mg by mouth daily.   valsartan-hydrochlorothiazide (DIOVAN-HCT) 160-25 MG tablet Take 1 tablet by mouth daily.   No facility-administered encounter medications on file as of 07/23/2022.    Allergies (verified) Metformin and related and Penicillins   History: Past Medical History:  Diagnosis Date   Arthritis    Breast cancer ([place]) 2001   right mastectomy   CVA (cerebral infarction) 2003, 2005   DM (diabetes mellitus) ([place])    GERD (gastroesophageal reflux  disease)    Heart murmur    Hyperlipidemia    Hypertension    Seizures ([place])    Vitamin D deficiency    Past Surgical History:  Procedure Laterality Date   ABDOMINAL HYSTERECTOMY  2000   GANGLION CYST EXCISION  2010   right (2010)  and left (before 2010) hand   MASTECTOMY  2001   right   TONSILECTOMY/ADENOIDECTOMY WITH MYRINGOTOMY Bilateral    age 76's   Family History  Problem Relation Age of Onset   Alzheimer's disease Mother    Parkinson's disease Mother    Heart Problems Father    Kidney disease Father        +dialysis   Heart disease Father    Other Sister        +TAH for fibroids   Breast cancer Sister        dx. 55-56   Renal cancer Sister        dx. 62-63; not a smoker   Early death Brother    Diabetes Brother    Cancer Paternal Aunt        NOS cancer   Colon cancer Neg Hx    Stomach cancer Neg Hx    Social History   Socioeconomic History   Marital status: Single    Spouse name: Not on file   Number of children: Not on file   Years of education: 12   Highest education level: Associate degree: academic program  Occupational History   Occupation: Retired    Fish farm manager: [place]: [employer]   Tobacco Use   Smoking status: Never   Smokeless tobacco: Never  Vaping Use   Vaping Use: Never used  Substance and Sexual Activity   Alcohol use: Not Currently    Alcohol/week: 1.0 standard drink of alcohol    Types: 1 Glasses of wine per week   Drug use: No   Sexual activity: Not Currently    Birth control/protection: Post-menopausal  Other Topics Concern   Not on file  Social History Narrative   Retired from school system, single, no children. Enjoys reading and word puzzles.    Jehovah's Witness - goes to [employer] get together [place], Saturdays   Her parents left their home to her and her sister   Social Determinants of Health   Financial Resource Strain: Low Risk  (07/23/2022)    Overall Financial Resource Strain (CARDIA)    Difficulty of Paying Living Expenses: Not very hard  Food Insecurity: No Food Insecurity (07/23/2022)   Hunger Vital Sign    Worried About Running Out of Food in the Last Year: Never true    [place] in the Last Year: Never true  Transportation Needs: No Transportation Needs (07/23/2022)   PRAPARE - Hydrologist (Medical): No    Lack of Transportation (Non-Medical): No  Physical  Activity: Sufficiently Active (07/23/2022)   Exercise Vital Sign    Days of Exercise per Week: 7 days    Minutes of Exercise per Session: 30 min  Stress: No Stress Concern Present (07/23/2022)   Independence    Feeling of Stress : Only a little  Social Connections: Moderately Integrated (07/23/2022)   Social Connection and Isolation Panel [NHANES]    Frequency of Communication with Friends and Family: More than three times a week    Frequency of Social Gatherings with Friends and Family: More than three times a week    Attends Religious Services: More than 4 times per year    Active Member of [employer] or Organizations: Yes    Attends Music therapist: More than 4 times per year    Marital Status: Never married    Tobacco Counseling Counseling given: Not Answered   Clinical Intake:  Pre-visit preparation completed: Yes  Pain : No/denies pain     BMI - recorded: 27.12 Nutritional Status: BMI 25 -29 Overweight Nutritional Risks: None Diabetes: Yes CBG done?: No Did pt. bring in CBG monitor from home?: No  How often do you need to have someone help you when you read instructions, pamphlets, or other written materials from your doctor or pharmacy?: 1 - Never  Diabetic? Nutrition Risk Assessment:  Has the patient had any N/V/D within the last 2 months?  No  Does the patient have any non-healing wounds?  No  Has the patient had any unintentional  weight loss or weight gain?  No   Diabetes:  Is the patient diabetic?  Yes  If diabetic, was a CBG obtained today?  No  Did the patient bring in their glucometer from home?  No  How often do you monitor your CBG's? Every morning fasting - 173 this am per patient.   Financial Strains and Diabetes Management:  Are you having any financial strains with the device, your supplies or your medication? No .  Does the patient want to be seen by Chronic Care Management for management of their diabetes?  No  Would the patient like to be referred to a Nutritionist or for Diabetic Management?  No   Diabetic Exams:  Diabetic Eye Exam: Completed 03/2022 per pt - we need record.   Diabetic Foot Exam: Completed 03/08/2022. Pt has been advised about the importance in completing this exam.    Interpreter Needed?: No  Information entered by :: Decorian Schuenemann, LPN   Activities of Daily Living    07/23/2022    3:37 PM  In your present state of health, do you have any difficulty performing the following activities:  Hearing? 0  Vision? 0  Difficulty concentrating or making decisions? 0  Walking or climbing stairs? 0  Dressing or bathing? 0  Doing errands, shopping? 1  Comment uses [facility]  Preparing Food and eating ? N  Using the Toilet? N  In the past six months, have you accidently leaked urine? N  Do you have problems with loss of bowel control? N  Managing your Medications? N  Managing your Finances? N  Housekeeping or managing your Housekeeping? N    Patient Care Team: Sharion Balloon, FNP as PCP - General (Nurse Practitioner) Tat, Eustace Quail, DO as Consulting Physician (Neurology) Pyrtle, Lajuan Lines, MD as Consulting Physician (Gastroenterology) Cassandria Anger, MD as Consulting Physician (Endocrinology) Steffanie Rainwater, DPM as Consulting Physician (Podiatry) Blanca Friend Royce Macadamia, [facility] as Pharmacist (Family  Medicine) Harlen Labs, MD as Referring Physician (Optometry)  Indicate any recent  Medical Services you may have received from other than [facility] providers in the past year (date may be approximate).     Assessment:   This is a routine wellness examination for Renee [name].  Hearing/Vision screen Hearing Screening - Comments:: Denies hearing difficulties   Vision Screening - Comments:: Wears rx glasses - up to date with routine eye exams with [facility]  Dietary issues and exercise activities discussed: Current Exercise Habits: Home exercise routine, Type of exercise: walking, Time (Minutes): 30, Frequency (Times/Week): 7, Weekly Exercise (Minutes/Week): 210, Intensity: Mild, Exercise limited by: neurologic condition(s)   Goals Addressed               This Visit's Progress     AWV        07/23/2022 AWV Goal: Diabetes Management  Patient will maintain an A1C level below 8.0 Patient will not develop any diabetic foot complications Patient will not experience any hypoglycemic episodes over the next 3 months Patient will notify our office of any CBG readings outside of the provider recommended range by calling [phone] Patient will adhere to provider recommendations for diabetes management  Patient Self Management Activities take all medications as prescribed and report any negative side effects monitor and record blood sugar readings as directed adhere to a low carbohydrate diet that incorporates lean proteins, vegetables, whole grains, low glycemic fruits check feet daily noting any sores, cracks, injuries, or callous formations see PCP or podiatrist if she notices any changes in her legs, feet, or toenails Patient will visit PCP and have an A1C level checked every 3 to 6 months as directed  have a yearly eye exam to monitor for vascular changes associated with diabetes and will request that the report be sent to her pcp.  consult with her PCP regarding any changes in her health or new or worsening symptoms       Patient Stated        07/23/2022 AWV  Goal: Exercise for General Health  Patient will verbalize understanding of the benefits of increased physical activity: Exercising regularly is important. It will improve your overall fitness, flexibility, and endurance. Regular exercise also will improve your overall health. It can help you control your weight, reduce stress, and improve your bone density. Over the next year, patient will increase physical activity as tolerated with a goal of at least 150 minutes of moderate physical activity per week.  You can tell that you are exercising at a moderate intensity if your heart starts beating faster and you start breathing faster but can still hold a conversation. Moderate-intensity exercise ideas include: Walking 1 mile (1.6 km) in about 15 minutes Biking Hiking Golfing Dancing Water aerobics Patient will verbalize understanding of everyday activities that increase physical activity by providing examples like the following: Yard work, such as: Sales promotion account executive Gardening Washing windows or floors Patient will be able to explain general safety guidelines for exercising:  Before you start a new exercise program, talk with your health care provider. Do not exercise so much that you hurt yourself, feel dizzy, or get very short of breath. Wear comfortable clothes and wear shoes with good support. Drink plenty of water while you exercise to prevent dehydration or heat stroke. Work out until your breathing and your heartbeat get faster.       T2DM (pt-stated)  On track     Current Barriers:  Unable to independently afford treatment regimen Unable to achieve control of T2DM   Pharmacist Clinical Goal(s):  Over the next 90 days, patient will verbalize ability to afford treatment regimen achieve control of T2DM as evidenced by IMPROVED GLYCEMIC CONTROL, GOAL A1C<7% through collaboration with PharmD and  provider.    Interventions: 1:1 collaboration with Sharion Balloon, FNP regarding development and update of comprehensive plan of care as evidenced by provider attestation and co-signature Inter-disciplinary care team collaboration (see longitudinal plan of care) Comprehensive medication review performed; medication list updated in electronic medical record  Diabetes: Uncontrolled; current treatment:JANUVIA 50MG, FARXIGA, GLIPIZIDE;  A1C>10% FOR >22YR MED ACCESS IS AN ISSUE MAY HAVE TO ADD INSULIN, BUT PATIENT DOES NOT WANT TO AT THIS TIME Can increase januvia but will need to recheck labs PATIENT NOW HAS ALL MEDICATIONS AND HAS STARTED TAKING THEM--WILL CONTINUE TO FOLLOW METFORMIN CAUSED SEIZURES IN THE PAST GFR 49 Will see PCP on 09/08/21 Current glucose readings: fasting glucose: N/A-DOESN'T CHECK, post prandial glucose: N/A Denies hypoglycemic/hyperglycemic symptoms Discussed meal planning options and Plate method for healthy eating Avoid sugary drinks and desserts Incorporate balanced protein, non starchy veggies, 1 serving of carbohydrate with each meal Increase water intake Increase physical activity as able Current exercise: N/A Educated on [place], CONTINUED ADHERENCE Assessed patient finances. ENROLLED PATIENT IN [place] (JANUVIA) & AZ&ME (FARXIGA) PATIENT ASSISTANCE PROGRAMS--ENROLLMENT ENDS 12/26/21   Patient Goals/Self-Care Activities Over the next 90 days, patient will:  - take medications as prescribed check glucose 3X TIMES WEEKLY, document, and provide at future appointments  Follow Up Plan: Telephone follow up appointment with care management team member scheduled for: 09/2021        Depression Screen    07/23/2022    3:42 PM 06/08/2022   10:03 AM 03/08/2022    8:56 AM 09/08/2021   10:57 AM 07/22/2021    8:54 AM 05/05/2021   11:50 AM 02/24/2021    8:56 AM  PHQ 2/9 Scores  PHQ - 2 Score 0 0 0 0 0 0 0  PHQ- 9 Score 0 0         Fall Risk     07/23/2022    3:35 PM 06/08/2022   10:02 AM 03/08/2022    8:56 AM 09/08/2021   10:57 AM 07/22/2021    8:51 AM  Fall Risk   Falls in the past year? 0 0 0 0 0  Number falls in past yr: 0    0  Injury with Fall? 0    0  Risk for fall due to : Other (Comment)    No Fall Risks  Risk for fall due to: Comment hx stroke, neuropathy      Follow up Falls prevention discussed    Falls prevention discussed    FALL RISK PREVENTION PERTAINING TO THE HOME:  Any stairs in or around the home? No  If so, are there any without handrails? No  Home free of loose throw rugs in walkways, pet beds, electrical cords, etc? Yes  Adequate lighting in your home to reduce risk of falls? Yes   ASSISTIVE DEVICES UTILIZED TO PREVENT FALLS:  Life alert? No  Use of a cane, walker or w/c? No  Grab bars in the bathroom? No  Shower chair or bench in shower? No  Elevated toilet seat or a handicapped toilet? No   TIMED UP AND GO:  Was the test performed? No . Telephonic visit  Cognitive Function:    07/14/2017   10:04 AM 07/05/2016   10:24 AM 07/05/2016   10:23 AM  MMSE - Mini Mental State Exam  Not completed: Unable to complete    Orientation to time 5 5   Orientation to Place 5 5   Registration '3 3 3  ' Attention/ Calculation 1 5   Recall 3 2   Language- name 2 objects 2 2   Language- repeat 1 1   Language- follow 3 step command 3 3   Language- read & follow direction 0 1   Write a sentence 0 1   Copy design 1 1   Total score 24 29         07/23/2022    3:44 PM 07/22/2021    8:54 AM 07/21/2020    9:41 AM 06/05/2019   10:55 AM  6CIT Screen  What Year? 0 points 0 points 0 points 0 points  What month? 0 points 0 points 0 points 0 points  What time? 0 points 0 points 0 points 0 points  Count back from 20 0 points 0 points 0 points 2 points  Months in reverse 0 points 0 points 4 points 0 points  Repeat phrase 2 points 2 points 2 points 0 points  Total Score 2 points 2 points 6 points 2 points     Immunizations  There is no immunization history on file for this patient.  TDAP status: Due, Education has been provided regarding the importance of this vaccine. Advised may receive this vaccine at local pharmacy or Health Dept. Aware to provide a copy of the vaccination record if obtained from local pharmacy or Health Dept. Verbalized acceptance and understanding.  Flu Vaccine status: Declined, Education has been provided regarding the importance of this vaccine but patient still declined. Advised may receive this vaccine at local pharmacy or Health Dept. Aware to provide a copy of the vaccination record if obtained from local pharmacy or Health Dept. Verbalized acceptance and understanding.  Pneumococcal vaccine status: Declined,  Education has been provided regarding the importance of this vaccine but patient still declined. Advised may receive this vaccine at local pharmacy or Health Dept. Aware to provide a copy of the vaccination record if obtained from local pharmacy or Health Dept. Verbalized acceptance and understanding.   Covid-19 vaccine status: Declined, Education has been provided regarding the importance of this vaccine but patient still declined. Advised may receive this vaccine at local pharmacy or Health Dept.or vaccine clinic. Aware to provide a copy of the vaccination record if obtained from local pharmacy or Health Dept. Verbalized acceptance and understanding.  Qualifies for Shingles Vaccine? Yes   Zostavax completed No   Shingrix Completed?: No.    Education has been provided regarding the importance of this vaccine. Patient has been advised to call insurance company to determine out of pocket expense if they have not yet received this vaccine. Advised may also receive vaccine at local pharmacy or Health Dept. Verbalized acceptance and understanding.  Screening Tests Health Maintenance  Topic Date Due   COVID-19 Vaccine (1) Never done   Zoster Vaccines- Shingrix (1 of  2) Never done   OPHTHALMOLOGY EXAM  02/23/2022   Pneumonia Vaccine 54+ Years old (1 - PCV) 06/09/2023 (Originally 03/02/2015)   INFLUENZA VACCINE  07/27/2022   COLONOSCOPY (Pts 45-7yr Insurance coverage will need to be confirmed)  08/17/2022   HEMOGLOBIN A1C  12/08/2022   TETANUS/TDAP  12/27/2022   FOOT EXAM  03/09/2023   MAMMOGRAM  05/26/2023   DEXA SCAN  Completed   Hepatitis C Screening  Completed   HPV VACCINES  Aged Out    Health Maintenance  Health Maintenance Due  Topic Date Due   COVID-19 Vaccine (1) Never done   Zoster Vaccines- Shingrix (1 of 2) Never done   OPHTHALMOLOGY EXAM  02/23/2022    Colorectal cancer screening: Type of screening: Colonoscopy. Completed 08/17/2012. Repeat every 10 years  Mammogram status: Completed 05/25/2022. Repeat every year  Bone Density status: Completed 12/26/2015. Results reflect: Bone density results: OSTEOPENIA. Repeat every 2-5 years. Unsure of results - not noted in chart - patient will repeat at next visit.  Lung Cancer Screening: (Low Dose CT Chest recommended if Age 36-80 years, 30 pack-year currently smoking OR have quit w/in 15years.) does not qualify.   Additional Screening:  Hepatitis C Screening: does qualify; Completed 12/26/2015  Vision Screening: Recommended annual ophthalmology exams for early detection of glaucoma and other disorders of the eye. Is the patient up to date with their annual eye exam?  Yes  Who is the provider or what is the name of the office in which the patient attends annual eye exams? [facility] If pt is not established with a provider, would they like to be referred to a provider to establish care? No .   Dental Screening: Recommended annual dental exams for proper oral hygiene  Community Resource Referral / Chronic Care Management: CRR required this visit?  No   CCM required this visit?  No      Plan:     I have personally reviewed and noted the following in the patient's  chart:   Medical and social history Use of alcohol, tobacco or illicit drugs  Current medications and supplements including opioid prescriptions.  Functional ability and status Nutritional status Physical activity Advanced directives List of other physicians Hospitalizations, surgeries, and ER visits in previous 12 months Vitals Screenings to include cognitive, depression, and falls Referrals and appointments  In addition, I have reviewed and discussed with patient certain preventive protocols, quality metrics, and best practice recommendations. A written personalized care plan for preventive services as well as general preventive health recommendations were provided to patient.     Sandrea Hammond, LPN   9/62/[id number]   Nurse Notes: None

## 2022-08-10 DIAGNOSIS — M79676 Pain in unspecified toe(s): Secondary | ICD-10-CM | POA: Diagnosis not present

## 2022-08-10 DIAGNOSIS — L84 Corns and callosities: Secondary | ICD-10-CM | POA: Diagnosis not present

## 2022-08-10 DIAGNOSIS — B351 Tinea unguium: Secondary | ICD-10-CM | POA: Diagnosis not present

## 2022-08-10 DIAGNOSIS — E1151 Type 2 diabetes mellitus with diabetic peripheral angiopathy without gangrene: Secondary | ICD-10-CM | POA: Diagnosis not present

## 2022-08-26 ENCOUNTER — Encounter: Payer: Self-pay | Admitting: Internal Medicine

## 2022-09-01 ENCOUNTER — Other Ambulatory Visit: Payer: Self-pay | Admitting: Family

## 2022-09-01 DIAGNOSIS — E1169 Type 2 diabetes mellitus with other specified complication: Secondary | ICD-10-CM

## 2022-09-10 ENCOUNTER — Encounter: Payer: Self-pay | Admitting: Family

## 2022-09-10 ENCOUNTER — Ambulatory Visit (INDEPENDENT_AMBULATORY_CARE_PROVIDER_SITE_OTHER): Payer: Medicare Other | Admitting: Family

## 2022-09-10 VITALS — BP 135/78 | HR 71 | Temp 97.7°F | Ht 66.0 in | Wt 166.0 lb

## 2022-09-10 DIAGNOSIS — K2100 Gastro-esophageal reflux disease with esophagitis, without bleeding: Secondary | ICD-10-CM

## 2022-09-10 DIAGNOSIS — I152 Hypertension secondary to endocrine disorders: Secondary | ICD-10-CM | POA: Diagnosis not present

## 2022-09-10 DIAGNOSIS — Z8673 Personal history of transient ischemic attack (TIA), and cerebral infarction without residual deficits: Secondary | ICD-10-CM

## 2022-09-10 DIAGNOSIS — E1159 Type 2 diabetes mellitus with other circulatory complications: Secondary | ICD-10-CM

## 2022-09-10 DIAGNOSIS — E1142 Type 2 diabetes mellitus with diabetic polyneuropathy: Secondary | ICD-10-CM | POA: Diagnosis not present

## 2022-09-10 DIAGNOSIS — E1169 Type 2 diabetes mellitus with other specified complication: Secondary | ICD-10-CM

## 2022-09-10 DIAGNOSIS — Z91199 Patient's noncompliance with other medical treatment and regimen due to unspecified reason: Secondary | ICD-10-CM

## 2022-09-10 DIAGNOSIS — E785 Hyperlipidemia, unspecified: Secondary | ICD-10-CM | POA: Diagnosis not present

## 2022-09-10 DIAGNOSIS — E663 Overweight: Secondary | ICD-10-CM

## 2022-09-10 LAB — CMP14+EGFR
ALT: 24 IU/L (ref 0–32)
AST: 23 IU/L (ref 0–40)
Albumin/Globulin Ratio: 1.5 (ref 1.2–2.2)
Albumin: 4.4 g/dL (ref 3.8–4.8)
Alkaline Phosphatase: 103 IU/L (ref 44–121)
BUN/Creatinine Ratio: 14 (ref 12–28)
BUN: 17 mg/dL (ref 8–27)
Bilirubin Total: 0.3 mg/dL (ref 0.0–1.2)
CO2: 23 mmol/L (ref 20–29)
Calcium: 10.3 mg/dL (ref 8.7–10.3)
Chloride: 101 mmol/L (ref 96–106)
Creatinine, Ser: 1.24 mg/dL — ABNORMAL HIGH (ref 0.57–1.00)
Globulin, Total: 2.9 g/dL (ref 1.5–4.5)
Glucose: 153 mg/dL — ABNORMAL HIGH (ref 70–99)
Potassium: 4.3 mmol/L (ref 3.5–5.2)
Sodium: 139 mmol/L (ref 134–144)
Total Protein: 7.3 g/dL (ref 6.0–8.5)
eGFR: 46 mL/min/{1.73_m2} — ABNORMAL LOW (ref >59)

## 2022-09-10 LAB — BAYER DCA HB A1C WAIVED: HB A1C (BAYER DCA - WAIVED): 10.1 % — ABNORMAL HIGH (ref 4.8–5.6)

## 2022-09-10 NOTE — Progress Notes (Signed)
Subjective:    Patient ID: Renee [name], female    DOB: [birth year], 71 y.o.   MRN: [id number]  Chief Complaint  Patient presents with   Medical Management of Chronic Issues   Pt presents to the office today for follow up. Her DM is uncontrolled and is followed by Podiatry every 8 weeks. She has seen Endocrinologists, but states she has not seen them in awhile because of transportation. Her last A1C was >12.6.   She refuses any medication changes because she is scared of side effects.    She has diabetic neuropathy and takes gabapentin 400 mg TID. States her pain is an aching, burning  pain of 0 out 10 with medications.   Has hx of CVA.  Hypertension This is a chronic problem. The current episode started more than 1 year ago. The problem has been resolved since onset. The problem is controlled. Pertinent negatives include no blurred vision, malaise/fatigue, peripheral edema or shortness of breath. Risk factors for coronary artery disease include dyslipidemia, obesity, diabetes mellitus and sedentary lifestyle. The current treatment provides moderate improvement. Hypertensive end-organ damage includes CVA. There is no history of heart failure.  Gastroesophageal Reflux She complains of belching and heartburn. This is a chronic problem. The current episode started more than 1 year ago. The problem occurs occasionally. She has tried a PPI for the symptoms. The treatment provided moderate relief.  Hyperlipidemia This is a chronic problem. The current episode started more than 1 year ago. The problem is controlled. Recent lipid tests were reviewed and are normal. Pertinent negatives include no shortness of breath. Current antihyperlipidemic treatment includes statins. The current treatment provides moderate improvement of lipids. Risk factors for coronary artery disease include dyslipidemia, hypertension, a sedentary lifestyle, post-menopausal and diabetes mellitus.  Diabetes She presents for her  follow-up diabetic visit. She has type 2 diabetes mellitus. Her disease course has been stable. Associated symptoms include foot paresthesias. Pertinent negatives for diabetes include no blurred vision. Symptoms are stable. Diabetic complications include a CVA and peripheral neuropathy. Pertinent negatives for diabetic complications include no heart disease. Risk factors for coronary artery disease include dyslipidemia, diabetes mellitus, hypertension, sedentary lifestyle and post-menopausal. She is following a generally unhealthy diet. Her overall blood glucose range is 140-180 mg/dl.      Review of Systems  Constitutional:  Negative for malaise/fatigue.  Eyes:  Negative for blurred vision.  Respiratory:  Negative for shortness of breath.   Gastrointestinal:  Positive for heartburn.  All other systems reviewed and are negative.      Objective:   Physical Exam Vitals reviewed.  Constitutional:      General: She is not in acute distress.    Appearance: She is well-developed.  HENT:     Head: Normocephalic and atraumatic.     Right Ear: Tympanic membrane normal.     Left Ear: Tympanic membrane normal.  Eyes:     Pupils: Pupils are equal, round, and reactive to light.  Neck:     Thyroid: No thyromegaly.  Cardiovascular:     Rate and Rhythm: Normal rate and regular rhythm.     Heart sounds: Normal heart sounds. No murmur heard. Pulmonary:     Effort: Pulmonary effort is normal. No respiratory distress.     Breath sounds: Normal breath sounds. No wheezing.  Abdominal:     General: Bowel sounds are normal. There is no distension.     Palpations: Abdomen is soft.     Tenderness: There is no abdominal  tenderness.  Musculoskeletal:        General: No tenderness. Normal range of motion.     Cervical back: Normal range of motion and neck supple.  Skin:    General: Skin is warm and dry.  Neurological:     Mental Status: She is alert and oriented to person, place, and time.      Cranial Nerves: No cranial nerve deficit.     Deep Tendon Reflexes: Reflexes are normal and symmetric.  Psychiatric:        Behavior: Behavior normal.        Thought Content: Thought content normal.        Judgment: Judgment normal.       BP 135/78   Pulse 71   Temp 97.7 F (36.5 C) (Temporal)   Ht '5\' 6"'  (1.676 m)   Wt 166 lb (75.3 kg)   BMI 26.79 kg/m      Assessment & Plan:  Renee [name] comes in today with chief complaint of Medical Management of Chronic Issues   Diagnosis and orders addressed:  1. Hypertension associated with diabetes ([facility]) - CMP14+EGFR - AMB Referral to [facility]  2. Gastroesophageal reflux disease with esophagitis, unspecified whether hemorrhage - CMP14+EGFR - AMB Referral to [facility]  3. Diabetic peripheral neuropathy ([facility]) - CMP14+EGFR - AMB Referral to [facility]  4. Type 2 diabetes mellitus with diabetic polyneuropathy, without long-term current use of insulin ([facility]) - Bayer DCA Hb A1c Waived - CMP14+EGFR - Microalbumin / creatinine urine ratio - AMB Referral to [facility]  5. Hyperlipidemia associated with type 2 diabetes mellitus ([facility]) - CMP14+EGFR - AMB Referral to [facility]  6. Overweight (BMI 25.0-29.9) - CMP14+EGFR - AMB Referral to [facility]  7. Non-compliance - CMP14+EGFR - AMB Referral to [facility]  8. History of CVA (cerebrovascular accident) - CMP14+EGFR - AMB Referral to [facility] pending Strict low carb diet, PT refuses any medications changes. Referral to [facility] Maintenance reviewed Diet and exercise encouraged  Follow up plan: 3 months    Evelina Dun, FNP

## 2022-09-10 NOTE — Patient Instructions (Signed)

## 2022-09-12 LAB — MICROALBUMIN / CREATININE URINE RATIO
Creatinine, Urine: 82.5 mg/dL
Microalb/Creat Ratio: 12 mg/g creat (ref 0–29)
Microalbumin, Urine: 9.7 ug/mL

## 2022-09-16 ENCOUNTER — Telehealth: Payer: Self-pay | Admitting: Pharmacist

## 2022-09-16 DIAGNOSIS — E1169 Type 2 diabetes mellitus with other specified complication: Secondary | ICD-10-CM

## 2022-09-16 DIAGNOSIS — E1142 Type 2 diabetes mellitus with diabetic polyneuropathy: Secondary | ICD-10-CM

## 2022-09-16 NOTE — Telephone Encounter (Signed)
New referral placed for CCM

## 2022-09-21 ENCOUNTER — Telehealth: Payer: Self-pay

## 2022-09-21 NOTE — Telephone Encounter (Signed)
Patient r/c  

## 2022-09-21 NOTE — Chronic Care Management (AMB) (Unsigned)
  Chronic Care Management   Outreach Note  09/21/2022 Name: Renee [name] MRN: [id number] DOB: Dec 28, 1950  Renee [name] is a 71 y.o. year old female who is a primary care patient of Sharion Balloon, FNP. I reached out to Renee [name] by phone today in response to a referral sent by Renee [name]'s primary care provider.  An unsuccessful telephone outreach was attempted today. The patient was referred to the case management team for assistance with care management and care coordination.   Follow Up Plan: A HIPAA compliant phone message was left for the patient providing contact information and requesting a return call.  The care management team will reach out to the patient again over the next 7 days.  If patient returns call to provider office, please advise to call [facility] * at [phone]*  Noreene Larsson, [place], [place] [address] Direct Dial: [phone] [email or web address]

## 2022-09-22 NOTE — Chronic Care Management (AMB) (Signed)
  Chronic Care Management   Note  09/22/2022 Name: Renee [name] MRN: [id number] DOB: December 01, 1951  Renee [name] is a 71 y.o. year old female who is a primary care patient of Sharion Balloon, FNP. I reached out to Renee [name] by phone today in response to a referral sent by Renee [name]'s PCP.  [name] was given information about Chronic Care Management services today including:  CCM service includes personalized support from designated clinical staff supervised by her physician, including individualized plan of care and coordination with other care providers 24/7 contact phone numbers for assistance for urgent and routine care needs. Service will only be billed when office clinical staff spend 20 minutes or more in a month to coordinate care. Only one practitioner may furnish and bill the service in a calendar month. The patient may stop CCM services at any time (effective at the end of the month) by phone call to the office staff. The patient is responsible for co-pay (up to 20% after annual deductible is met) if co-pay is required by the individual health plan.   Patient agreed to services and verbal consent obtained.   Follow up plan: Telephone appointment with care management team member scheduled for:11/05/2022  Noreene Larsson, [place], [place] [address] Direct Dial: [phone] [email or web address]

## 2022-10-14 ENCOUNTER — Ambulatory Visit (INDEPENDENT_AMBULATORY_CARE_PROVIDER_SITE_OTHER): Payer: Medicare Other | Admitting: *Deleted

## 2022-10-14 ENCOUNTER — Encounter: Payer: Self-pay | Admitting: *Deleted

## 2022-10-14 DIAGNOSIS — E1142 Type 2 diabetes mellitus with diabetic polyneuropathy: Secondary | ICD-10-CM

## 2022-10-14 DIAGNOSIS — I152 Hypertension secondary to endocrine disorders: Secondary | ICD-10-CM

## 2022-10-15 NOTE — Chronic Care Management (AMB) (Cosign Needed Addendum)
Chronic Care Management Provider Comprehensive Care Plan    Name: Renee [name] MRN: [id number] DOB: 1951/11/15  Referral to Chronic Care Management (CCM) services was placed by Provider:  Evelina Dun on Date: 09/16/22.  Chronic Conditions and Provider Assessments and Plans  Goals Addressed             This Visit's Progress    CCM (DIABETES) EXPECTED OUTCOME: MONITOR, SELF-MANAGE AND REDUCE SYMPTOMS OF DIABETES       Current Barriers:  Knowledge Deficits related to management of Diabetes Chronic Disease Management support and education needs related to Diabetes management, diet Patient reports she checks CBG once daily fasting with ranges 100-122 with occasional 200 Patient states she tries to follow a special diet, drinks tea with splenda Patient reports she does "a little exercise", walks at times  Planned Interventions: Provided education to patient about basic DM disease process; Reviewed medications with patient and discussed importance of medication adherence;        Reviewed prescribed diet with patient carbohydrate modified, gave examples of food choices that will elevate blood sugar; Discussed plans with patient for ongoing care management follow up and provided patient with direct contact information for care management team;      Provided patient with written educational materials related to hypo and hyperglycemia and importance of correct treatment;       Review of patient status, including review of consultants reports, relevant laboratory and other test results, and medications completed;       Screening for signs and symptoms of depression related to chronic disease state;        Assessed social determinant of health barriers;        Reviewed importance of daily exercise, continue walking  Symptom Management: Take medications as prescribed   Attend all scheduled provider appointments Call pharmacy for medication refills 3-7 days in advance of running out of  medications Attend church or other social activities Perform all self care activities independently  Perform IADL's (shopping, preparing meals, housekeeping, managing finances) independently Call provider office for new concerns or questions  check blood sugar at prescribed times: once daily check feet daily for cuts, sores or redness enter blood sugar readings and medication or insulin into daily log take the blood sugar log to all doctor visits take the blood sugar meter to all doctor visits trim toenails straight across fill half of plate with vegetables manage portion size  Follow Up Plan: Telephone follow up appointment with care management team member scheduled for:  11/15/22 at 9 am       CCM (HYPERTENSION)  EXPECTED OUTCOME:  MONITOR,SELF- MANAGE AND REDUCE SYMPTOMS OF HYPERTENSION       Current Barriers:  Knowledge Deficits related to Hypertension Care Coordination needs related to obtaining a blood pressure cuff in a patient with hypertension Chronic Disease Management support and education needs related to importance of monitoring blood pressure, adherence to low sodium diet and exercise Patient lives with her sister, is independent with ADL, IADL's, uses [place], does not have blood pressure cuff and will check on through [facility] benefits catalog  Planned Interventions: Evaluation of current treatment plan related to hypertension self management and patient's adherence to plan as established by provider;   Reviewed prescribed diet low sodium and education sent via My Chart Reviewed medications with patient and discussed importance of compliance;  Provided assistance with obtaining home blood pressure monitor via [facility] benefits catalog (pt reports she is able to  call and check on);  Counseled on the importance of exercise goals with target of 150 minutes per week Discussed plans with patient for ongoing care management follow up and provided  patient with direct contact information for care management team; Discussed complications of poorly controlled blood pressure such as heart disease, stroke, circulatory complications, vision complications, kidney impairment, sexual dysfunction;  Screening for signs and symptoms of depression related to chronic disease state;  Assessed social determinant of health barriers;   Symptom Management: Take medications as prescribed   Attend all scheduled provider appointments Call pharmacy for medication refills 3-7 days in advance of running out of medications Attend church or other social activities Perform all self care activities independently  Perform IADL's (shopping, preparing meals, housekeeping, managing finances) independently Call provider office for new concerns or questions  check blood pressure 3 times per week choose a place to take my blood pressure (home, clinic or office, retail store) keep a blood pressure log take blood pressure log to all doctor appointments call doctor for signs and symptoms of high blood pressure take medications for blood pressure exactly as prescribed eat more whole grains, fruits and vegetables, lean meats and healthy fats Please obtain blood pressure cuff [facility] benefits catalog Look over education sent via My Chart- low sodium diet  Follow Up Plan: Telephone follow up appointment with care management team member scheduled for: The patient has been provided with contact information for the care management team and has been advised to call with any health related questions or concerns.        CCM (KIDNEY FAILURE)  EXPECTED OUTCOME:  MONITOR, SELF-MANAGE AND REDUCE SYMPTOMS OF KIDNEY FAILURE       Current Barriers:  Knowledge Deficits related to Kidney Failure Chronic Disease Management support and education needs related to Kidney Failure  Planned Interventions: Reviewed importance of taking medications as prescribed Reviewed  importance of sign/symptoms of fluid overload Reviewed importance of keeping blood pressure and blood sugar within normal limits  Symptom Management: Take medications as prescribed   Attend all scheduled provider appointments Call pharmacy for medication refills 3-7 days in advance of running out of medications Attend church or other social activities Perform all self care activities independently  Perform IADL's (shopping, preparing meals, housekeeping, managing finances) independently Call provider office for new concerns or questions   Follow Up Plan: Telephone follow up appointment with care management team member scheduled for: 11/15/22 at 9 am              Problem List Patient Active Problem List   Diagnosis Date Noted   Non-compliance 03/08/2022   Occipital neuralgia of right side 10/16/2020   Essential hypertension, benign 02/15/2019   Overweight (BMI 25.0-29.9) 01/03/2017   Constipation 01/03/2017   Diabetic peripheral neuropathy ([facility]) 09/30/2016   Diabetes mellitus ([facility]) 07/05/2016   History of breast cancer in female 04/02/2016   Family history of breast cancer in sister 04/02/2016   GERD (gastroesophageal reflux disease) 02/05/2014   Hyperlipidemia associated with type 2 diabetes mellitus ([place]) 02/05/2014   Hypertension associated with diabetes ([place]) 02/05/2014   History of CVA (cerebrovascular accident) 02/05/2014    Medication Management Outpatient Encounter Medications as of 10/14/2022  Medication Sig Note   aspirin 81 MG tablet Take 81 mg by mouth daily.    atorvastatin (LIPITOR) 40 MG tablet Take 1 tablet by mouth once daily    Blood Glucose Calibration (ONETOUCH VERIO) SOLN Use as needed to calibrate glucometer  Blood Glucose Monitoring Suppl ([place]) w/Device KIT Use to check blood sugar twice daily    Cholecalciferol (VITAMIN D3) 10 MCG (400 UNIT) CAPS Take by mouth.    dapagliflozin propanediol (FARXIGA) 10 MG TABS tablet Take  1 tablet (10 mg total) by mouth daily before breakfast.    docusate sodium (COLACE) 100 MG capsule Take 100 mg by mouth daily.    gabapentin (NEURONTIN) 400 MG capsule Take 1 capsule (400 mg total) by mouth 3 (three) times daily.    glipiZIDE (GLUCOTROL) 10 MG tablet Take 1 tablet (10 mg total) by mouth 2 (two) times daily before a meal.    glucose blood (ONETOUCH VERIO) test strip USE TO CHECK BLOOD SUGAR TWICE DAILY Dx E11.9    omeprazole (PRILOSEC) 20 MG capsule Take 1 capsule (20 mg total) by mouth daily.    ONETOUCH DELICA LANCETS 13W MISC Use to check BG up to bid.  Dx: E11.65 type 2 DM with insulin therapy, uncontrolled    sitaGLIPtin (JANUVIA) 50 MG tablet Take 50 mg by mouth daily. 07/24/2021: GETS VIA PATIENT ASSISTANCE   valsartan-hydrochlorothiazide (DIOVAN-HCT) 160-25 MG tablet TAKE 1 TABLET BY MOUTH ONCE DAILY . APPOINTMENT REQUIRED FOR FUTURE REFILLS    No facility-administered encounter medications on file as of 10/14/2022.    Cognitive Assessment Identity Confirmed: _0  Name; _1  DOB Cognitive Status: _2  Normal _3  Abnormal  Functional Status Survey: Is the patient deaf or have difficulty hearing?: No Does the patient have difficulty seeing, even when wearing glasses/contacts?: No Does the patient have difficulty walking or climbing stairs?: No (takes extra time but able to independently climb stairs) Does the patient have difficulty dressing or bathing?: No Does the patient have difficulty doing errands alone such as visiting a doctor's office or shopping?: No (uses [place] transportation)  Caregiver Assessment  Current Living Arrangement: home Living arrangements - the patient lives with their family. Caregiver: lives with sister who is able to assist if needed Caregiver Relation: Sibling Status: Stable, available  Interaction and coordination with outside resources, practitioners, and providers See CCM Referral

## 2022-10-15 NOTE — Chronic Care Management (AMB) (Cosign Needed Addendum)
CCM RN Visit Note   '@DATE'$ @ Name: Renee [name] MRN: [id number]      DOB: 04/11/1951  Subjective: Renee [name] is a 71 y.o. year old female who is a primary care patient of '@PCP'$ . The patient was referred to the Chronic Care Management team for assistance with care management needs subsequent to provider initiation of CCM services and plan of care.      Today's Visit: Engaged with patient by telephone for initial visit.   Consent to Services: Informed consent for Chronic Care Management services obtained by the referring provider.   Assessment: Review of patient past medical history, allergies, medications, health status, including review of consultants reports, laboratory and other test data, was performed as part of comprehensive evaluation and needs assessment for chronic care management services.   SDOH Interventions Today    Flowsheet Row Most Recent Value  SDOH Interventions   Food Insecurity Interventions Intervention Not Indicated  Housing Interventions Intervention Not Indicated  Transportation Interventions Intervention Not Indicated  Utilities Interventions Intervention Not Indicated  Financial Strain Interventions Intervention Not Indicated  Physical Activity Interventions Intervention Not Indicated  Stress Interventions Intervention Not Indicated  Social Connections Interventions Intervention Not Indicated        Goals Addressed             This Visit's Progress    CCM (DIABETES) EXPECTED OUTCOME: MONITOR, SELF-MANAGE AND REDUCE SYMPTOMS OF DIABETES       Current Barriers:  Knowledge Deficits related to management of Diabetes Chronic Disease Management support and education needs related to Diabetes management, diet Patient reports she checks CBG once daily fasting with ranges 100-122 with occasional 200 Patient states she tries to follow a special diet, drinks tea with splenda Patient reports she does "a little exercise", walks at times  Planned  Interventions: Provided education to patient about basic DM disease process; Reviewed medications with patient and discussed importance of medication adherence;        Reviewed prescribed diet with patient carbohydrate modified, gave examples of food choices that will elevate blood sugar; Discussed plans with patient for ongoing care management follow up and provided patient with direct contact information for care management team;      Provided patient with written educational materials related to hypo and hyperglycemia and importance of correct treatment;       Review of patient status, including review of consultants reports, relevant laboratory and other test results, and medications completed;       Screening for signs and symptoms of depression related to chronic disease state;        Assessed social determinant of health barriers;        Reviewed importance of daily exercise, continue walking  Symptom Management: Take medications as prescribed   Attend all scheduled provider appointments Call pharmacy for medication refills 3-7 days in advance of running out of medications Attend church or other social activities Perform all self care activities independently  Perform IADL's (shopping, preparing meals, housekeeping, managing finances) independently Call provider office for new concerns or questions  check blood sugar at prescribed times: once daily check feet daily for cuts, sores or redness enter blood sugar readings and medication or insulin into daily log take the blood sugar log to all doctor visits take the blood sugar meter to all doctor visits trim toenails straight across fill half of plate with vegetables manage portion size  Follow Up Plan: Telephone follow up appointment with care management team member scheduled for:  11/15/22 at 9 am       CCM (HYPERTENSION)  EXPECTED OUTCOME:  MONITOR,SELF- MANAGE AND REDUCE SYMPTOMS OF HYPERTENSION       Current Barriers:   Knowledge Deficits related to Hypertension Care Coordination needs related to obtaining a blood pressure cuff in a patient with hypertension Chronic Disease Management support and education needs related to importance of monitoring blood pressure, adherence to low sodium diet and exercise Patient lives with her sister, is independent with ADL, IADL's, [place] [employer] [place], does not have blood pressure cuff and will check on through [facility] benefits catalog  Planned Interventions: Evaluation of current treatment plan related to hypertension self management and patient's adherence to plan as established by provider;   Reviewed prescribed diet low sodium and education sent via My Chart Reviewed medications with patient and discussed importance of compliance;  Provided assistance with obtaining home blood pressure monitor via [facility] benefits catalog (pt reports she is able to call and check on);  Counseled on the importance of exercise goals with target of 150 minutes per week Discussed plans with patient for ongoing care management follow up and provided patient with direct contact information for care management team; Discussed complications of poorly controlled blood pressure such as heart disease, stroke, circulatory complications, vision complications, kidney impairment, sexual dysfunction;  Screening for signs and symptoms of depression related to chronic disease state;  Assessed social determinant of health barriers;   Symptom Management: Take medications as prescribed   Attend all scheduled provider appointments Call pharmacy for medication refills 3-7 days in advance of running out of medications Attend church or other social activities Perform all self care activities independently  Perform IADL's (shopping, preparing meals, housekeeping, managing finances) independently Call provider office for new concerns or questions  check blood pressure 3 times per  week choose a place to take my blood pressure (home, clinic or office, retail store) keep a blood pressure log take blood pressure log to all doctor appointments call doctor for signs and symptoms of high blood pressure take medications for blood pressure exactly as prescribed eat more whole grains, fruits and vegetables, lean meats and healthy fats Please obtain blood pressure cuff [facility] benefits catalog Look over education sent via My Chart- low sodium diet  Follow Up Plan: Telephone follow up appointment with care management team member scheduled for: The patient has been provided with contact information for the care management team and has been advised to call with any health related questions or concerns.        CCM (KIDNEY FAILURE)  EXPECTED OUTCOME:  MONITOR, SELF-MANAGE AND REDUCE SYMPTOMS OF KIDNEY FAILURE       Current Barriers:  Knowledge Deficits related to Kidney Failure Chronic Disease Management support and education needs related to Kidney Failure  Planned Interventions: Reviewed importance of taking medications as prescribed Reviewed importance of sign/symptoms of fluid overload Reviewed importance of keeping blood pressure and blood sugar within normal limits  Symptom Management: Take medications as prescribed   Attend all scheduled provider appointments Call pharmacy for medication refills 3-7 days in advance of running out of medications Attend church or other social activities Perform all self care activities independently  Perform IADL's (shopping, preparing meals, housekeeping, managing finances) independently Call provider office for new concerns or questions   Follow Up Plan: Telephone follow up appointment with care management team member scheduled for: 11/15/22 at 9 am  Plan:Telephone follow up appointment with care management team member scheduled for:  11/15/22 at 9 am  Jacqlyn Larsen [facility], BSN RN Case Manager [place] [phone]

## 2022-10-15 NOTE — Patient Instructions (Addendum)
Please call the care guide team at [phone] if you need to cancel or reschedule your appointment.   If you are experiencing a Mental Health or [place] or need someone to talk to, please call the Suicide and Crisis Lifeline: 988 call the [place] National Suicide Prevention Lifeline: (740)495-1700 or TTY: 540-002-3231 TTY 303-398-4547) to talk to a trained counselor call 1-800-273-TALK (toll free, 24 hour hotline) go to [facility] [place] [phone]) call the [place]: [phone] call 911   Following is a copy of your full provider care plan:   Goals Addressed             This Visit's Progress    CCM (DIABETES) EXPECTED OUTCOME: MONITOR, SELF-MANAGE AND REDUCE SYMPTOMS OF DIABETES       Current Barriers:  Knowledge Deficits related to management of Diabetes Chronic Disease Management support and education needs related to Diabetes management, diet Patient reports she checks CBG once daily fasting with ranges 100-122 with occasional 200 Patient states she tries to follow a special diet, drinks tea with splenda Patient reports she does "a little exercise", walks at times  Planned Interventions: Provided education to patient about basic DM disease process; Reviewed medications with patient and discussed importance of medication adherence;        Reviewed prescribed diet with patient carbohydrate modified, gave examples of food choices that will elevate blood sugar; Discussed plans with patient for ongoing care management follow up and provided patient with direct contact information for care management team;      Provided patient with written educational materials related to hypo and hyperglycemia and importance of correct treatment;       Review of patient status, including review of consultants reports, relevant laboratory and other test results, and medications completed;        Screening for signs and symptoms of depression related to chronic disease state;        Assessed social determinant of health barriers;        Reviewed importance of daily exercise, continue walking  Symptom Management: Take medications as prescribed   Attend all scheduled provider appointments Call pharmacy for medication refills 3-7 days in advance of running out of medications Attend church or other social activities Perform all self care activities independently  Perform IADL's (shopping, preparing meals, housekeeping, managing finances) independently Call provider office for new concerns or questions  check blood sugar at prescribed times: once daily check feet daily for cuts, sores or redness enter blood sugar readings and medication or insulin into daily log take the blood sugar log to all doctor visits take the blood sugar meter to all doctor visits trim toenails straight across fill half of plate with vegetables manage portion size  Follow Up Plan: Telephone follow up appointment with care management team member scheduled for:  11/15/22 at 9 am       CCM (HYPERTENSION)  EXPECTED OUTCOME:  MONITOR,SELF- MANAGE AND REDUCE SYMPTOMS OF HYPERTENSION       Current Barriers:  Knowledge Deficits related to Hypertension Care Coordination needs related to obtaining a blood pressure cuff in a patient with hypertension Chronic Disease Management support and education needs related to importance of monitoring blood pressure, adherence to low sodium diet and exercise Patient lives with her sister, is independent with ADL, IADL's, uses county van for transportation, does not have blood pressure cuff and will check on through [facility] benefits catalog  Planned Interventions: Evaluation of  current treatment plan related to hypertension self management and patient's adherence to plan as established by provider;   Reviewed prescribed diet low sodium and education sent via My Chart Reviewed  medications with patient and discussed importance of compliance;  Provided assistance with obtaining home blood pressure monitor via [facility] benefits catalog (pt reports she is able to call and check on);  Counseled on the importance of exercise goals with target of 150 minutes per week Discussed plans with patient for ongoing care management follow up and provided patient with direct contact information for care management team; Discussed complications of poorly controlled blood pressure such as heart disease, stroke, circulatory complications, vision complications, kidney impairment, sexual dysfunction;  Screening for signs and symptoms of depression related to chronic disease state;  Assessed social determinant of health barriers;   Symptom Management: Take medications as prescribed   Attend all scheduled provider appointments Call pharmacy for medication refills 3-7 days in advance of running out of medications Attend church or other social activities Perform all self care activities independently  Perform IADL's (shopping, preparing meals, housekeeping, managing finances) independently Call provider office for new concerns or questions  check blood pressure 3 times per week choose a place to take my blood pressure (home, clinic or office, retail store) keep a blood pressure log take blood pressure log to all doctor appointments call doctor for signs and symptoms of high blood pressure take medications for blood pressure exactly as prescribed eat more whole grains, fruits and vegetables, lean meats and healthy fats Please obtain blood pressure cuff [facility] benefits catalog Look over education sent via My Chart- low sodium diet  Follow Up Plan: Telephone follow up appointment with care management team member scheduled for: The patient has been provided with contact information for the care management team and has been advised to call with any health related  questions or concerns.        CCM (KIDNEY FAILURE)  EXPECTED OUTCOME:  MONITOR, SELF-MANAGE AND REDUCE SYMPTOMS OF KIDNEY FAILURE       Current Barriers:  Knowledge Deficits related to Kidney Failure Chronic Disease Management support and education needs related to Kidney Failure  Planned Interventions: Reviewed importance of taking medications as prescribed Reviewed importance of sign/symptoms of fluid overload Reviewed importance of keeping blood pressure and blood sugar within normal limits  Symptom Management: Take medications as prescribed   Attend all scheduled provider appointments Call pharmacy for medication refills 3-7 days in advance of running out of medications Attend church or other social activities Perform all self care activities independently  Perform IADL's (shopping, preparing meals, housekeeping, managing finances) independently Call provider office for new concerns or questions   Follow Up Plan: Telephone follow up appointment with care management team member scheduled for: 11/15/22 at 9 am             Patient verbalizes understanding of instructions and care plan provided today and agrees to view in [place]. Active MyChart status and patient understanding of how to access instructions and care plan via MyChart confirmed with patient.     Telephone follow up appointment with care management team member scheduled for:   11/15/22 at 9 am  Low-Sodium Eating Plan Sodium, which is an element that makes up salt, helps you maintain a healthy balance of fluids in your body. Too much sodium can increase your blood pressure and cause fluid and waste to be held in your body. Your health care provider or dietitian may  recommend following this plan if you have high blood pressure (hypertension), kidney disease, liver disease, or heart failure. Eating less sodium can help lower your blood pressure, reduce swelling, and protect your heart, liver, and kidneys. What are  tips for following this plan? Reading food labels The Nutrition Facts label lists the amount of sodium in one serving of the food. If you eat more than one serving, you must multiply the listed amount of sodium by the number of servings. Choose foods with less than 140 mg of sodium per serving. Avoid foods with 300 mg of sodium or more per serving. Shopping  Look for lower-sodium products, often labeled as "low-sodium" or "no salt added." Always check the sodium content, even if foods are labeled as "unsalted" or "no salt added." Buy fresh foods. Avoid canned foods and pre-made or frozen meals. Avoid canned, cured, or processed meats. Buy breads that have less than 80 mg of sodium per slice. Cooking  Eat more home-cooked food and less restaurant, buffet, and fast food. Avoid adding salt when cooking. Use salt-free seasonings or herbs instead of table salt or sea salt. Check with your health care provider or pharmacist before using salt substitutes. Cook with plant-based oils, such as canola, sunflower, or olive oil. Meal planning When eating at a restaurant, ask that your food be prepared with less salt or no salt, if possible. Avoid dishes labeled as brined, pickled, cured, smoked, or made with soy sauce, miso, or teriyaki sauce. Avoid foods that contain MSG (monosodium glutamate). MSG is sometimes added to [place] food, bouillon, and some canned foods. Make meals that can be grilled, baked, poached, roasted, or steamed. These are generally made with less sodium. General information Most people on this plan should limit their sodium intake to 1,500-2,000 mg (milligrams) of sodium each day. What foods should I eat? Fruits Fresh, frozen, or canned fruit. Fruit juice. Vegetables Fresh or frozen vegetables. "No salt added" canned vegetables. "No salt added" tomato sauce and paste. Low-sodium or reduced-sodium tomato and vegetable juice. Grains Low-sodium cereals, including oats, puffed  wheat and rice, and shredded wheat. Low-sodium crackers. Unsalted rice. Unsalted pasta. Low-sodium bread. Whole-grain breads and whole-grain pasta. Meats and other proteins Fresh or frozen (no salt added) meat, poultry, seafood, and fish. Low-sodium canned tuna and salmon. Unsalted nuts. Dried peas, beans, and lentils without added salt. Unsalted canned beans. Eggs. Unsalted nut butters. Dairy Milk. Soy milk. Cheese that is naturally low in sodium, such as ricotta cheese, fresh mozzarella, or Swiss cheese. Low-sodium or reduced-sodium cheese. Cream cheese. Yogurt. Seasonings and condiments Fresh and dried herbs and spices. Salt-free seasonings. Low-sodium mustard and ketchup. Sodium-free salad dressing. Sodium-free light mayonnaise. Fresh or refrigerated horseradish. Lemon juice. Vinegar. Other foods Homemade, reduced-sodium, or low-sodium soups. Unsalted popcorn and pretzels. Low-salt or salt-free chips. The items listed above may not be a complete list of foods and beverages you can eat. Contact a dietitian for more information. What foods should I avoid? Vegetables Sauerkraut, pickled vegetables, and relishes. Olives. [place] fries. Onion rings. Regular canned vegetables (not low-sodium or reduced-sodium). Regular canned tomato sauce and paste (not low-sodium or reduced-sodium). Regular tomato and vegetable juice (not low-sodium or reduced-sodium). Frozen vegetables in sauces. Grains Instant hot cereals. Bread stuffing, pancake, and biscuit mixes. Croutons. Seasoned rice or pasta mixes. Noodle soup cups. Boxed or frozen macaroni and cheese. Regular salted crackers. Self-rising flour. Meats and other proteins Meat or fish that is salted, canned, smoked, spiced, or pickled. Precooked or cured meat, such  as sausages or meat loaves. [place] Salines. Ham. Pepperoni. Hot dogs. Corned beef. Chipped beef. Salt pork. Jerky. Pickled herring. Anchovies and sardines. Regular canned tuna. Salted nuts. Dairy Processed  cheese and cheese spreads. Hard cheeses. Cheese curds. Blue cheese. Feta cheese. String cheese. Regular cottage cheese. Buttermilk. Canned milk. Fats and oils Salted butter. Regular margarine. Ghee. Bacon fat. Seasonings and condiments Onion salt, garlic salt, seasoned salt, table salt, and sea salt. Canned and packaged gravies. Worcestershire sauce. Tartar sauce. Barbecue sauce. Teriyaki sauce. Soy sauce, including reduced-sodium. Steak sauce. Fish sauce. Oyster sauce. Cocktail sauce. Horseradish that you find on the shelf. Regular ketchup and mustard. Meat flavorings and tenderizers. Bouillon cubes. Hot sauce. Pre-made or packaged marinades. Pre-made or packaged taco seasonings. Relishes. Regular salad dressings. Salsa. Other foods Salted popcorn and pretzels. Corn chips and puffs. Potato and tortilla chips. Canned or dried soups. Pizza. Frozen entrees and pot pies. The items listed above may not be a complete list of foods and beverages you should avoid. Contact a dietitian for more information. Summary Eating less sodium can help lower your blood pressure, reduce swelling, and protect your heart, liver, and kidneys. Most people on this plan should limit their sodium intake to 1,500-2,000 mg (milligrams) of sodium each day. Canned, boxed, and frozen foods are high in sodium. Restaurant foods, fast foods, and pizza are also very high in sodium. You also get sodium by adding salt to food. Try to cook at home, eat more fresh fruits and vegetables, and eat less fast food and canned, processed, or prepared foods. This information is not intended to replace advice given to you by your health care provider. Make sure you discuss any questions you have with your health care provider. Document Revised: 01/18/2020 Document Reviewed: 11/14/2019 Elsevier Patient Education  [place].  Hypoglycemia is when the sugar (glucose) level in your blood is too low. Low blood sugar can happen to people who  have diabetes and people who do not have diabetes. Low blood sugar can happen quickly, and it can be an emergency. What are the causes? This condition happens most often in people who have diabetes. It may be caused by: Diabetes medicine. Not eating enough, or not eating often enough. Doing more physical activity. Drinking alcohol on an empty stomach. If you do not have diabetes, this condition may be caused by: A tumor in the pancreas. Not eating enough, or not eating for long periods at a time (fasting). A very bad infection or illness. Problems after having weight loss (bariatric) surgery. Kidney failure or liver failure. Certain medicines. What increases the risk? This condition is more likely to develop in people who: Have diabetes and take medicines to lower their blood sugar. Abuse alcohol. Have a very bad illness. What are the signs or symptoms? Mild Hunger. Sweating and feeling clammy. Feeling dizzy or light-headed. Being sleepy or having trouble sleeping. Feeling like you may vomit (nauseous). A fast heartbeat. A headache. Blurry vision. Mood changes, such as: Being grouchy. Feeling worried or nervous (anxious). Tingling or loss of feeling (numbness) around your mouth, lips, or tongue. Moderate Confusion and poor judgment. Behavior changes. Weakness. Uneven heartbeat. Trouble with moving (coordination). Very low Very low blood sugar (severe hypoglycemia) is a medical emergency. It can cause: Fainting. Seizures. Loss of consciousness (coma). Death. How is this treated? Treating low blood sugar Low blood sugar is often treated by eating or drinking something that has sugar in it right away. The food or drink should contain 15  grams of a fast-acting carb (carbohydrate). Options include: 4 oz (120 mL) of fruit juice. 4 oz (120 mL) of regular soda (not diet soda). A few pieces of hard candy. Check food labels to see how many pieces to eat for 15 grams. 1 Tbsp  (15 mL) of sugar or honey. 4 glucose tablets. 1 tube of glucose gel. Treating low blood sugar if you have diabetes If you can think clearly and swallow safely, follow the 15:15 rule: Take 15 grams of a fast-acting carb. Talk with your doctor about how much you should take. Always keep a source of fast-acting carb with you, such as: Glucose tablets (take 4 tablets). A few pieces of hard candy. Check food labels to see how many pieces to eat for 15 grams. 4 oz (120 mL) of fruit juice. 4 oz (120 mL) of regular soda (not diet soda). 1 Tbsp (15 mL) of honey or sugar. 1 tube of glucose gel. Check your blood sugar 15 minutes after you take the carb. If your blood sugar is still at or below 70 mg/dL (3.9 mmol/L), take 15 grams of a carb again. If your blood sugar does not go above 70 mg/dL (3.9 mmol/L) after 3 tries, get help right away. After your blood sugar goes back to normal, eat a meal or a snack within 1 hour.  Treating very low blood sugar If your blood sugar is below 54 mg/dL (3 mmol/L), you have very low blood sugar, or severe hypoglycemia. This is an emergency. Get medical help right away. If you have very low blood sugar and you cannot eat or drink, you will need to be given a hormone called glucagon. A family member or friend should learn how to check your blood sugar and how to give you glucagon. Ask your doctor if you need to have an emergency glucagon kit at home. Very low blood sugar may also need to be treated in a hospital. Follow these instructions at home: General instructions Take over-the-counter and prescription medicines only as told by your doctor. Stay aware of your blood sugar as told by your doctor. If you drink alcohol: Limit how much you have to: 0-1 drink a day for women who are not pregnant. 0-2 drinks a day for men. Know how much alcohol is in your drink. In the [place] one drink equals one 12 oz bottle of beer (355 mL), one 5 oz glass of wine (148 mL), or one 1  oz glass of hard liquor (44 mL). Be sure to eat food when you drink alcohol. Know that your body absorbs alcohol quickly. This may lead to low blood sugar later. Be sure to keep checking your blood sugar. Keep all follow-up visits. If you have diabetes:  Always have a fast-acting carb (15 grams) with you to treat low blood sugar. Follow your diabetes care plan as told by your doctor. Make sure you: Know the symptoms of low blood sugar. Check your blood sugar as often as told. Always check it before and after exercise. Always check your blood sugar before you drive. Take your medicines as told. Follow your meal plan. Eat on time. Do not skip meals. Share your diabetes care plan with: Your work or school. People you live with. Carry a card or wear jewelry that says you have diabetes. Where to find more information American Diabetes Association: www.diabetes.org Contact a doctor if: You have trouble keeping your blood sugar in your target range. You have low blood sugar often. Get help  right away if: You still have symptoms after you eat or drink something that contains 15 grams of fast-acting carb, and you cannot get your blood sugar above 70 mg/dL by following the 15:15 rule. Your blood sugar is below 54 mg/dL (3 mmol/L). You have a seizure. You faint. These symptoms may be an emergency. Get help right away. Call your local emergency services (911 in the [place] Do not wait to see if the symptoms will go away. Do not drive yourself to the hospital. Summary Hypoglycemia happens when the level of sugar (glucose) in your blood is too low. Low blood sugar can happen to people who have diabetes and people who do not have diabetes. Low blood sugar can happen quickly, and it can be an emergency. Make sure you know the symptoms of low blood sugar and know how to treat it. Always keep a source of sugar (fast-acting carb) with you to treat low blood sugar. This information is not intended to  replace advice given to you by your health care provider. Make sure you discuss any questions you have with your health care provider. Document Revised: 11/13/2020 Document Reviewed: 11/13/2020 Elsevier Patient Education  [place].

## 2022-10-19 DIAGNOSIS — M79676 Pain in unspecified toe(s): Secondary | ICD-10-CM | POA: Diagnosis not present

## 2022-10-19 DIAGNOSIS — E1151 Type 2 diabetes mellitus with diabetic peripheral angiopathy without gangrene: Secondary | ICD-10-CM | POA: Diagnosis not present

## 2022-10-19 DIAGNOSIS — L84 Corns and callosities: Secondary | ICD-10-CM | POA: Diagnosis not present

## 2022-10-19 DIAGNOSIS — B351 Tinea unguium: Secondary | ICD-10-CM | POA: Diagnosis not present

## 2022-10-26 DIAGNOSIS — I152 Hypertension secondary to endocrine disorders: Secondary | ICD-10-CM | POA: Diagnosis not present

## 2022-10-26 DIAGNOSIS — I10 Essential (primary) hypertension: Secondary | ICD-10-CM | POA: Diagnosis not present

## 2022-10-26 DIAGNOSIS — E1159 Type 2 diabetes mellitus with other circulatory complications: Secondary | ICD-10-CM

## 2022-11-04 ENCOUNTER — Telehealth: Payer: Self-pay

## 2022-11-04 NOTE — Telephone Encounter (Signed)
Patient eligible for 2024 re-enrollment with [employer]

## 2022-11-05 ENCOUNTER — Telehealth: Payer: Medicare Other

## 2022-11-11 ENCOUNTER — Telehealth: Payer: Medicare Other

## 2022-11-15 ENCOUNTER — Ambulatory Visit (INDEPENDENT_AMBULATORY_CARE_PROVIDER_SITE_OTHER): Payer: Medicare Other | Admitting: *Deleted

## 2022-11-15 DIAGNOSIS — E1142 Type 2 diabetes mellitus with diabetic polyneuropathy: Secondary | ICD-10-CM

## 2022-11-15 DIAGNOSIS — I152 Hypertension secondary to endocrine disorders: Secondary | ICD-10-CM

## 2022-11-15 NOTE — Chronic Care Management (AMB) (Signed)
Chronic Care Management   CCM RN Visit Note  11/15/2022 Name: Renee [name] MRN: [id number] DOB: 12/06/1951  Subjective: Renee [name] is a 71 y.o. year old female who is a primary care patient of Sharion Balloon, FNP. The patient was referred to the Chronic Care Management team for assistance with care management needs subsequent to provider initiation of CCM services and plan of care.    Today's Visit:  Engaged with patient by telephone for follow up visit.        Goals Addressed             This Visit's Progress    CCM (DIABETES) EXPECTED OUTCOME: MONITOR, SELF-MANAGE AND REDUCE SYMPTOMS OF DIABETES       Current Barriers:  Knowledge Deficits related to management of Diabetes Chronic Disease Management support and education needs related to Diabetes management, diet Patient reports she checks CBG once daily fasting with ranges 100-200 with today's reading 195, states trying to be careful and not eat "too much potatoes and things like that" Patient states she tries to follow a special diet, drinks tea with splenda Patient reports she does "a little exercise", walks at times  Planned Interventions: Provided education to patient about basic DM disease process; Reviewed medications with patient and discussed importance of medication adherence;        Reviewed prescribed diet with patient carbohydrate modified, gave examples of food choices that will elevate blood sugar; Advised patient, providing education and rationale, to check cbg once daily and record        Review of patient status, including review of consultants reports, relevant laboratory and other test results, and medications completed;       Reinforced importance of daily exercise, continue walking  Symptom Management: Take medications as prescribed   Attend all scheduled provider appointments Call pharmacy for medication refills 3-7 days in advance of running out of medications Attend church or other social  activities Perform all self care activities independently  Perform IADL's (shopping, preparing meals, housekeeping, managing finances) independently Call provider office for new concerns or questions  check blood sugar at prescribed times: once daily check feet daily for cuts, sores or redness enter blood sugar readings and medication or insulin into daily log take the blood sugar log to all doctor visits take the blood sugar meter to all doctor visits trim toenails straight across drink 6 to 8 glasses of water each day limit fast food meals to no more than 1 per week manage portion size read food labels for fat, fiber, carbohydrates and portion size Continue walking- keep up the good work!  Follow Up Plan: Telephone follow up appointment with care management team member scheduled for:  12/28/22 at 9 am       CCM (HYPERTENSION)  EXPECTED OUTCOME:  MONITOR,SELF- MANAGE AND REDUCE SYMPTOMS OF HYPERTENSION       Current Barriers:  Knowledge Deficits related to Hypertension Care Coordination needs related to obtaining a blood pressure cuff in a patient with hypertension Chronic Disease Management support and education needs related to importance of monitoring blood pressure, adherence to low sodium diet and exercise Patient lives with her sister, is independent with ADL, IADL's, [place] [employer] [place], does not have blood pressure cuff and does intend to check on through [facility] benefits catalog Patient reports she is trying to be mindful of sodium intake here at the holidays  Planned Interventions: Evaluation of current treatment plan related to hypertension self management and patient's adherence  to plan as established by provider;   Reviewed prescribed diet low sodium  Reviewed medications with patient and discussed importance of compliance;  Provided assistance with obtaining home blood pressure monitor via [facility] benefits catalog (pt reports she is able to call and  check on);  Counseled on the importance of exercise goals with target of 150 minutes per week Discussed complications of poorly controlled blood pressure such as heart disease, stroke, circulatory complications, vision complications, kidney impairment, sexual dysfunction;   Symptom Management: Take medications as prescribed   Attend all scheduled provider appointments Call pharmacy for medication refills 3-7 days in advance of running out of medications Attend church or other social activities Perform all self care activities independently  Perform IADL's (shopping, preparing meals, housekeeping, managing finances) independently Call provider office for new concerns or questions  check blood pressure 3 times per week choose a place to take my blood pressure (home, clinic or office, retail store) learn about high blood pressure keep a blood pressure log take blood pressure log to all doctor appointments call doctor for signs and symptoms of high blood pressure take medications for blood pressure exactly as prescribed eat more whole grains, fruits and vegetables, lean meats and healthy fats Please obtain blood pressure cuff [place] benefits catalog Continue to follow a low sodium diet, please read food labels Avoid salty snacks, fast food  Follow Up Plan: Telephone follow up appointment with care management team member scheduled for:  12/28/22 at 9 am       CCM (KIDNEY FAILURE)  EXPECTED OUTCOME:  MONITOR, SELF-MANAGE AND REDUCE SYMPTOMS OF KIDNEY FAILURE       Current Barriers:  Knowledge Deficits related to Kidney Failure/ CKD Chronic Disease Management support and education needs related to Kidney Failure/ CKD  Planned Interventions: Reviewed importance of taking medications as prescribed Reinforced low sodium diet Reviewed importance of keeping blood pressure and blood sugar within normal limits Reviewed impact that hyperglycemia and hypertension have on the kidneys/  body  Symptom Management: Take medications as prescribed   Attend all scheduled provider appointments Call pharmacy for medication refills 3-7 days in advance of running out of medications Attend church or other social activities Perform all self care activities independently  Perform IADL's (shopping, preparing meals, housekeeping, managing finances) independently Call provider office for new concerns or questions  Drink plenty of fluids- preferably water Continue to walk as much as you are able Keep blood sugar and blood pressure within normal limits  Follow Up Plan: Telephone follow up appointment with care management team member scheduled for: 12/28/22 at 9 am             Plan:Telephone follow up appointment with care management team member scheduled for:  12/28/22 at 9 am  Jacqlyn Larsen [facility], BSN RN Case Manager [place] [phone]

## 2022-11-15 NOTE — Patient Instructions (Signed)
Please call the care guide team at [phone] if you need to cancel or reschedule your appointment.   If you are experiencing a Mental Health or [place] or need someone to talk to, please call the Suicide and Crisis Lifeline: 988 call the [place] National Suicide Prevention Lifeline: 458-656-0404 or TTY: 2190726491 TTY 714 055 2545) to talk to a trained counselor call 1-800-273-TALK (toll free, 24 hour hotline) go to [facility] [place] [phone]) call the [place]: [phone] call 911   Following is a copy of your full provider care plan:   Goals Addressed             This Visit's Progress    CCM (DIABETES) EXPECTED OUTCOME: MONITOR, SELF-MANAGE AND REDUCE SYMPTOMS OF DIABETES       Current Barriers:  Knowledge Deficits related to management of Diabetes Chronic Disease Management support and education needs related to Diabetes management, diet Patient reports she checks CBG once daily fasting with ranges 100-200 with today's reading 195, states trying to be careful and not eat "too much potatoes and things like that" Patient states she tries to follow a special diet, drinks tea with splenda Patient reports she does "a little exercise", walks at times  Planned Interventions: Provided education to patient about basic DM disease process; Reviewed medications with patient and discussed importance of medication adherence;        Reviewed prescribed diet with patient carbohydrate modified, gave examples of food choices that will elevate blood sugar; Advised patient, providing education and rationale, to check cbg once daily and record        Review of patient status, including review of consultants reports, relevant laboratory and other test results, and medications completed;       Reinforced importance of daily exercise, continue walking  Symptom Management: Take medications as  prescribed   Attend all scheduled provider appointments Call pharmacy for medication refills 3-7 days in advance of running out of medications Attend church or other social activities Perform all self care activities independently  Perform IADL's (shopping, preparing meals, housekeeping, managing finances) independently Call provider office for new concerns or questions  check blood sugar at prescribed times: once daily check feet daily for cuts, sores or redness enter blood sugar readings and medication or insulin into daily log take the blood sugar log to all doctor visits take the blood sugar meter to all doctor visits trim toenails straight across drink 6 to 8 glasses of water each day limit fast food meals to no more than 1 per week manage portion size read food labels for fat, fiber, carbohydrates and portion size Continue walking- keep up the good work!  Follow Up Plan: Telephone follow up appointment with care management team member scheduled for:  12/28/22 at 9 am       CCM (HYPERTENSION)  EXPECTED OUTCOME:  MONITOR,SELF- MANAGE AND REDUCE SYMPTOMS OF HYPERTENSION       Current Barriers:  Knowledge Deficits related to Hypertension Care Coordination needs related to obtaining a blood pressure cuff in a patient with hypertension Chronic Disease Management support and education needs related to importance of monitoring blood pressure, adherence to low sodium diet and exercise Patient lives with her sister, is independent with ADL, IADL's, uses [place] [employer] [place], does not have blood pressure cuff and does intend to check on through [facility] benefits catalog Patient reports she is trying to be mindful of sodium intake here at the holidays  Planned Interventions: Evaluation of current treatment plan related to hypertension self management and patient's adherence to plan as established by provider;   Reviewed prescribed diet low sodium  Reviewed medications with patient and  discussed importance of compliance;  Provided assistance with obtaining home blood pressure monitor via [facility] benefits catalog (pt reports she is able to call and check on);  Counseled on the importance of exercise goals with target of 150 minutes per week Discussed complications of poorly controlled blood pressure such as heart disease, stroke, circulatory complications, vision complications, kidney impairment, sexual dysfunction;   Symptom Management: Take medications as prescribed   Attend all scheduled provider appointments Call pharmacy for medication refills 3-7 days in advance of running out of medications Attend church or other social activities Perform all self care activities independently  Perform IADL's (shopping, preparing meals, housekeeping, managing finances) independently Call provider office for new concerns or questions  check blood pressure 3 times per week choose a place to take my blood pressure (home, clinic or office, retail store) learn about high blood pressure keep a blood pressure log take blood pressure log to all doctor appointments call doctor for signs and symptoms of high blood pressure take medications for blood pressure exactly as prescribed eat more whole grains, fruits and vegetables, lean meats and healthy fats Please obtain blood pressure cuff [place] benefits catalog Continue to follow a low sodium diet, please read food labels Avoid salty snacks, fast food  Follow Up Plan: Telephone follow up appointment with care management team member scheduled for:  12/28/22 at 9 am       CCM (KIDNEY FAILURE)  EXPECTED OUTCOME:  MONITOR, SELF-MANAGE AND REDUCE SYMPTOMS OF KIDNEY FAILURE       Current Barriers:  Knowledge Deficits related to Kidney Failure/ CKD Chronic Disease Management support and education needs related to Kidney Failure/ CKD  Planned Interventions: Reviewed importance of taking medications as prescribed Reinforced  low sodium diet Reviewed importance of keeping blood pressure and blood sugar within normal limits Reviewed impact that hyperglycemia and hypertension have on the kidneys/ body  Symptom Management: Take medications as prescribed   Attend all scheduled provider appointments Call pharmacy for medication refills 3-7 days in advance of running out of medications Attend church or other social activities Perform all self care activities independently  Perform IADL's (shopping, preparing meals, housekeeping, managing finances) independently Call provider office for new concerns or questions  Drink plenty of fluids- preferably water Continue to walk as much as you are able Keep blood sugar and blood pressure within normal limits  Follow Up Plan: Telephone follow up appointment with care management team member scheduled for: 12/28/22 at 9 am             Patient verbalizes understanding of instructions and care plan provided today and agrees to view in [place]. Active MyChart status and patient understanding of how to access instructions and care plan via MyChart confirmed with patient.     Telephone follow up appointment with care management team member scheduled for:  12/28/22 at 9 am  Diabetes Mellitus and Nutrition, Adult When you have diabetes, or diabetes mellitus, it is very important to have healthy eating habits because your blood sugar (glucose) levels are greatly affected by what you eat and drink. Eating healthy foods in the right amounts, at about the same times every day, can help you: Manage your blood glucose. Lower your risk of heart disease. Improve your blood pressure. Reach or  maintain a healthy weight. What can affect my meal plan? Every person with diabetes is different, and each person has different needs for a meal plan. Your health care provider may recommend that you work with a dietitian to make a meal plan that is best for you. Your meal plan may vary depending on  factors such as: The calories you need. The medicines you take. Your weight. Your blood glucose, blood pressure, and cholesterol levels. Your activity level. Other health conditions you have, such as heart or kidney disease. How do carbohydrates affect me? Carbohydrates, also called carbs, affect your blood glucose level more than any other type of food. Eating carbs raises the amount of glucose in your blood. It is important to know how many carbs you can safely have in each meal. This is different for every person. Your dietitian can help you calculate how many carbs you should have at each meal and for each snack. How does alcohol affect me? Alcohol can cause a decrease in blood glucose (hypoglycemia), especially if you use insulin or take certain diabetes medicines by mouth. Hypoglycemia can be a life-threatening condition. Symptoms of hypoglycemia, such as sleepiness, dizziness, and confusion, are similar to symptoms of having too much alcohol. Do not drink alcohol if: Your health care provider tells you not to drink. You are pregnant, may be pregnant, or are planning to become pregnant. If you drink alcohol: Limit how much you have to: 0-1 drink a day for women. 0-2 drinks a day for men. Know how much alcohol is in your drink. In the [place] one drink equals one 12 oz bottle of beer (355 mL), one 5 oz glass of wine (148 mL), or one 1 oz glass of hard liquor (44 mL). Keep yourself hydrated with water, diet soda, or unsweetened iced tea. Keep in mind that regular soda, juice, and other mixers may contain a lot of sugar and must be counted as carbs. What are tips for following this plan?  Reading food labels Start by checking the serving size on the Nutrition Facts label of packaged foods and drinks. The number of calories and the amount of carbs, fats, and other nutrients listed on the label are based on one serving of the item. Many items contain more than one serving per package. Check  the total grams (g) of carbs in one serving. Check the number of grams of saturated fats and trans fats in one serving. Choose foods that have a low amount or none of these fats. Check the number of milligrams (mg) of salt (sodium) in one serving. Most people should limit total sodium intake to less than 2,300 mg per day. Always check the nutrition information of foods labeled as "low-fat" or "nonfat." These foods may be higher in added sugar or refined carbs and should be avoided. Talk to your dietitian to identify your daily goals for nutrients listed on the label. Shopping Avoid buying canned, pre-made, or processed foods. These foods tend to be high in fat, sodium, and added sugar. Shop around the outside edge of the grocery store. This is where you will most often find fresh fruits and vegetables, bulk grains, fresh meats, and fresh dairy products. Cooking Use low-heat cooking methods, such as baking, instead of high-heat cooking methods, such as deep frying. Cook using healthy oils, such as olive, canola, or sunflower oil. Avoid cooking with butter, cream, or high-fat meats. Meal planning Eat meals and snacks regularly, preferably at the same times every day. Avoid going  long periods of time without eating. Eat foods that are high in fiber, such as fresh fruits, vegetables, beans, and whole grains. Eat 4-6 oz (112-168 g) of lean protein each day, such as lean meat, chicken, fish, eggs, or tofu. One ounce (oz) (28 g) of lean protein is equal to: 1 oz (28 g) of meat, chicken, or fish. 1 egg.  cup (62 g) of tofu. Eat some foods each day that contain healthy fats, such as avocado, nuts, seeds, and fish. What foods should I eat? Fruits Berries. Apples. Oranges. Peaches. Apricots. Plums. Grapes. Mangoes. Papayas. Pomegranates. Kiwi. Cherries. Vegetables Leafy greens, including lettuce, spinach, kale, chard, collard greens, mustard greens, and cabbage. Beets. Cauliflower. Broccoli. Carrots.  Green beans. Tomatoes. Peppers. Onions. Cucumbers. Brussels sprouts. Grains Whole grains, such as whole-wheat or whole-grain bread, crackers, tortillas, cereal, and pasta. Unsweetened oatmeal. Quinoa. Brown or wild rice. Meats and other proteins Seafood. Poultry without skin. Lean cuts of poultry and beef. Tofu. Nuts. Seeds. Dairy Low-fat or fat-free dairy products such as milk, yogurt, and cheese. The items listed above may not be a complete list of foods and beverages you can eat and drink. Contact a dietitian for more information. What foods should I avoid? Fruits Fruits canned with syrup. Vegetables Canned vegetables. Frozen vegetables with butter or cream sauce. Grains Refined white flour and flour products such as bread, pasta, snack foods, and cereals. Avoid all processed foods. Meats and other proteins Fatty cuts of meat. Poultry with skin. Breaded or fried meats. Processed meat. Avoid saturated fats. Dairy Full-fat yogurt, cheese, or milk. Beverages Sweetened drinks, such as soda or iced tea. The items listed above may not be a complete list of foods and beverages you should avoid. Contact a dietitian for more information. Questions to ask a health care provider Do I need to meet with a certified diabetes care and education specialist? Do I need to meet with a dietitian? What number can I call if I have questions? When are the best times to check my blood glucose? Where to find more information: American Diabetes Association: diabetes.org Academy of Nutrition and Dietetics: eatright.[employer] of Diabetes and Digestive and Kidney Diseases: [employer] Association of Diabetes Care & Education Specialists: diabeteseducator.org Summary It is important to have healthy eating habits because your blood sugar (glucose) levels are greatly affected by what you eat and drink. It is important to use alcohol carefully. A healthy meal plan will help you manage your blood  glucose and lower your risk of heart disease. Your health care provider may recommend that you work with a dietitian to make a meal plan that is best for you. This information is not intended to replace advice given to you by your health care provider. Make sure you discuss any questions you have with your health care provider. Document Revised: 07/16/2020 Document Reviewed: 07/16/2020 Elsevier Patient Education  [place].

## 2022-11-25 DIAGNOSIS — E1159 Type 2 diabetes mellitus with other circulatory complications: Secondary | ICD-10-CM | POA: Diagnosis not present

## 2022-11-25 DIAGNOSIS — N189 Chronic kidney disease, unspecified: Secondary | ICD-10-CM

## 2022-11-25 DIAGNOSIS — E1122 Type 2 diabetes mellitus with diabetic chronic kidney disease: Secondary | ICD-10-CM | POA: Diagnosis not present

## 2022-11-25 DIAGNOSIS — I129 Hypertensive chronic kidney disease with stage 1 through stage 4 chronic kidney disease, or unspecified chronic kidney disease: Secondary | ICD-10-CM

## 2022-12-23 ENCOUNTER — Encounter: Payer: Self-pay | Admitting: Family

## 2022-12-23 ENCOUNTER — Ambulatory Visit (INDEPENDENT_AMBULATORY_CARE_PROVIDER_SITE_OTHER): Payer: Medicare Other | Admitting: Family

## 2022-12-23 VITALS — BP 102/65 | HR 80 | Temp 97.0°F | Ht 66.0 in | Wt 163.0 lb

## 2022-12-23 DIAGNOSIS — I152 Hypertension secondary to endocrine disorders: Secondary | ICD-10-CM | POA: Diagnosis not present

## 2022-12-23 DIAGNOSIS — E663 Overweight: Secondary | ICD-10-CM

## 2022-12-23 DIAGNOSIS — Z8673 Personal history of transient ischemic attack (TIA), and cerebral infarction without residual deficits: Secondary | ICD-10-CM

## 2022-12-23 DIAGNOSIS — E1159 Type 2 diabetes mellitus with other circulatory complications: Secondary | ICD-10-CM | POA: Diagnosis not present

## 2022-12-23 DIAGNOSIS — K5900 Constipation, unspecified: Secondary | ICD-10-CM | POA: Diagnosis not present

## 2022-12-23 DIAGNOSIS — I10 Essential (primary) hypertension: Secondary | ICD-10-CM

## 2022-12-23 DIAGNOSIS — Z91199 Patient's noncompliance with other medical treatment and regimen due to unspecified reason: Secondary | ICD-10-CM

## 2022-12-23 DIAGNOSIS — K2100 Gastro-esophageal reflux disease with esophagitis, without bleeding: Secondary | ICD-10-CM | POA: Diagnosis not present

## 2022-12-23 DIAGNOSIS — Z0001 Encounter for general adult medical examination with abnormal findings: Secondary | ICD-10-CM | POA: Diagnosis not present

## 2022-12-23 DIAGNOSIS — Z0000 Encounter for general adult medical examination without abnormal findings: Secondary | ICD-10-CM

## 2022-12-23 DIAGNOSIS — E1142 Type 2 diabetes mellitus with diabetic polyneuropathy: Secondary | ICD-10-CM | POA: Diagnosis not present

## 2022-12-23 DIAGNOSIS — E1169 Type 2 diabetes mellitus with other specified complication: Secondary | ICD-10-CM

## 2022-12-23 DIAGNOSIS — E785 Hyperlipidemia, unspecified: Secondary | ICD-10-CM

## 2022-12-23 DIAGNOSIS — Z6826 Body mass index (BMI) 26.0-26.9, adult: Secondary | ICD-10-CM

## 2022-12-23 LAB — BAYER DCA HB A1C WAIVED: HB A1C (BAYER DCA - WAIVED): 10.4 % — ABNORMAL HIGH (ref 4.8–5.6)

## 2022-12-23 NOTE — Progress Notes (Signed)
Subjective:    Patient ID: Renee [name], female    DOB: 06-Mar-1951, 71 y.o.   MRN: [id number]  Chief Complaint  Patient presents with   Medical Management of Chronic Issues   Pt presents to the office today for CPE and follow up. Her DM is uncontrolled and is followed by Podiatry every 8 weeks. She has seen Endocrinologists, but states she has not seen them in awhile because of transportation. Her last A1C was >10.1.   She refuses any medication changes because she is scared of side effects.    She has diabetic neuropathy and takes gabapentin 400 mg TID. States her pain is an aching, burning  pain of 0 out 10 with medications.    Has hx of CVA.  Hypertension This is a chronic problem. The current episode started more than 1 year ago. The problem has been resolved since onset. The problem is controlled. Pertinent negatives include no blurred vision, malaise/fatigue, peripheral edema or shortness of breath. Risk factors for coronary artery disease include diabetes mellitus, dyslipidemia and sedentary lifestyle. The current treatment provides moderate improvement. There is no history of heart failure.  Gastroesophageal Reflux She complains of belching and heartburn. This is a chronic problem. The current episode started more than 1 year ago. The problem occurs occasionally. She has tried a PPI for the symptoms. The treatment provided moderate relief.  Hyperlipidemia This is a chronic problem. The current episode started more than 1 year ago. The problem is controlled. Recent lipid tests were reviewed and are normal. She has no history of obesity. Pertinent negatives include no shortness of breath. Current antihyperlipidemic treatment includes statins. The current treatment provides moderate improvement of lipids. Risk factors for coronary artery disease include diabetes mellitus, dyslipidemia, hypertension, a sedentary lifestyle and post-menopausal.  Diabetes She presents for her follow-up  diabetic visit. She has type 2 diabetes mellitus. Associated symptoms include foot paresthesias. Pertinent negatives for diabetes include no blurred vision. Symptoms are stable. Diabetic complications include peripheral neuropathy. Risk factors for coronary artery disease include diabetes mellitus, dyslipidemia, hypertension, sedentary lifestyle and post-menopausal. She is following a generally unhealthy diet. Her overall blood glucose range is >200 mg/dl. Eye exam is current.  Constipation This is a chronic problem. The current episode started more than 1 year ago. The problem has been waxing and waning since onset. Her stool frequency is 4 to 5 times per week. She has tried stool softeners for the symptoms. The treatment provided moderate relief.      Review of Systems  Constitutional:  Negative for malaise/fatigue.  Eyes:  Negative for blurred vision.  Respiratory:  Negative for shortness of breath.   Gastrointestinal:  Positive for constipation and heartburn.  All other systems reviewed and are negative.      Family History  Problem Relation Age of Onset   Alzheimer's disease Mother    Parkinson's disease Mother    Heart Problems Father    Kidney disease Father        +dialysis   Heart disease Father    Other Sister        +TAH for fibroids   Breast cancer Sister        dx. 55-56   Renal cancer Sister        dx. 62-63; not a smoker   Early death Brother    Diabetes Brother    Cancer Paternal Aunt        NOS cancer   Colon cancer Neg Hx  Stomach cancer Neg Hx    Social History   Socioeconomic History   Marital status: Single    Spouse name: Not on file   Number of children: Not on file   Years of education: 12   Highest education level: Associate degree: academic program  Occupational History   Occupation: Retired    Fish farm manager: [employer]: [employer]   Tobacco Use   Smoking status: Never   Smokeless tobacco:  Never  Vaping Use   Vaping Use: Never used  Substance and Sexual Activity   Alcohol use: Not Currently    Alcohol/week: 1.0 standard drink of alcohol    Types: 1 Glasses of wine per week   Drug use: No   Sexual activity: Not Currently    Birth control/protection: Post-menopausal  Other Topics Concern   Not on file  Social History Narrative   Retired from school system, single, no children. Enjoys reading and word puzzles.    Jehovah's Witness - goes to [employer] get together [place], Saturdays   Her parents left their home to her and her sister   Social Determinants of Health   Financial Resource Strain: Low Risk  (10/14/2022)   Overall Financial Resource Strain (CARDIA)    Difficulty of Paying Living Expenses: Not hard at all  Food Insecurity: No Food Insecurity (10/14/2022)   Hunger Vital Sign    Worried About Running Out of Food in the Last Year: Never true    [place] in the Last Year: Never true  Transportation Needs: No Transportation Needs (10/14/2022)   PRAPARE - Hydrologist (Medical): No    Lack of Transportation (Non-Medical): No  Physical Activity: Insufficiently Active (10/14/2022)   Exercise Vital Sign    Days of Exercise per Week: 6 days    Minutes of Exercise per Session: 20 min  Stress: No Stress Concern Present (10/14/2022)   [place]    Feeling of Stress : Only a little  Social Connections: Moderately Integrated (10/14/2022)   Social Connection and Isolation Panel [NHANES]    Frequency of Communication with Friends and Family: More than three times a week    Frequency of Social Gatherings with Friends and Family: More than three times a week    Attends Religious Services: More than 4 times per year    Active Member of [employer] or Organizations: Yes    Attends Music therapist: More than 4 times per year    Marital Status: Never  married    Objective:   Physical Exam Vitals reviewed.  Constitutional:      General: She is not in acute distress.    Appearance: She is well-developed.  HENT:     Head: Normocephalic and atraumatic.     Right Ear: Tympanic membrane normal.     Left Ear: Tympanic membrane normal.  Eyes:     Pupils: Pupils are equal, round, and reactive to light.  Neck:     Thyroid: No thyromegaly.  Cardiovascular:     Rate and Rhythm: Normal rate and regular rhythm.     Heart sounds: Normal heart sounds. No murmur heard. Pulmonary:     Effort: Pulmonary effort is normal. No respiratory distress.     Breath sounds: Normal breath sounds. No wheezing.  Abdominal:     General: Bowel sounds are normal. There is no distension.  Palpations: Abdomen is soft.     Tenderness: There is no abdominal tenderness.  Musculoskeletal:        General: No tenderness. Normal range of motion.     Cervical back: Normal range of motion and neck supple.  Skin:    General: Skin is warm and dry.  Neurological:     Mental Status: She is alert and oriented to person, place, and time.     Cranial Nerves: No cranial nerve deficit.     Deep Tendon Reflexes: Reflexes are normal and symmetric.  Psychiatric:        Behavior: Behavior normal.        Thought Content: Thought content normal.        Judgment: Judgment normal.       BP 102/65   Pulse 80   Temp (!) 97 F (36.1 C) (Temporal)   Ht _0  (1.676 m)   Wt 163 lb (73.9 kg)   SpO2 100%   BMI 26.31 kg/m      Assessment & Plan:  KAYTLYN DIN comes in today with chief complaint of Medical Management of Chronic Issues   Diagnosis and orders addressed:  1. Type 2 diabetes mellitus with diabetic polyneuropathy, without long-term current use of insulin (HCC) - CMP14+EGFR - Bayer DCA Hb A1c Waived  2. Diabetic peripheral neuropathy (HCC) - CMP14+EGFR  3. Essential hypertension, benign - CMP14+EGFR  4. Gastroesophageal reflux disease with  esophagitis, unspecified whether hemorrhage - CMP14+EGFR  5. Overweight (BMI 25.0-29.9) - CMP14+EGFR  6. Non-compliance - CMP14+EGFR  7. Hypertension associated with diabetes ([place])  - CMP14+EGFR  8. Hyperlipidemia associated with type 2 diabetes mellitus (HCC)  - CMP14+EGFR - Lipid panel  9. History of CVA (cerebrovascular accident) - CMP14+EGFR  10. Constipation, unspecified constipation type - CMP14+EGFR  11. Annual physical exam - CMP14+EGFR - CBC with Differential/Platelet - Bayer DCA Hb A1c Waived - Lipid panel - TSH   Labs pending Health Maintenance reviewed Diet and exercise encouraged  Follow up plan: 3 months    Evelina Dun, FNP

## 2022-12-23 NOTE — Patient Instructions (Signed)

## 2022-12-24 ENCOUNTER — Other Ambulatory Visit: Payer: Self-pay | Admitting: Family

## 2022-12-24 DIAGNOSIS — K2100 Gastro-esophageal reflux disease with esophagitis, without bleeding: Secondary | ICD-10-CM

## 2022-12-24 DIAGNOSIS — E1142 Type 2 diabetes mellitus with diabetic polyneuropathy: Secondary | ICD-10-CM

## 2022-12-24 LAB — CBC WITH DIFFERENTIAL/PLATELET
Basophils Absolute: 0.1 10*3/uL (ref 0.0–0.2)
Basos: 1 %
EOS (ABSOLUTE): 0.1 10*3/uL (ref 0.0–0.4)
Eos: 2 %
Hematocrit: 38.5 % (ref 34.0–46.6)
Hemoglobin: 12.7 g/dL (ref 11.1–15.9)
Immature Grans (Abs): 0 10*3/uL (ref 0.0–0.1)
Immature Granulocytes: 0 %
Lymphocytes Absolute: 1.6 10*3/uL (ref 0.7–3.1)
Lymphs: 30 %
MCH: 28 pg (ref 26.6–33.0)
MCHC: 33 g/dL (ref 31.5–35.7)
MCV: 85 fL (ref 79–97)
Monocytes Absolute: 0.4 10*3/uL (ref 0.1–0.9)
Monocytes: 8 %
Neutrophils Absolute: 3.1 10*3/uL (ref 1.4–7.0)
Neutrophils: 59 %
Platelets: 339 10*3/uL (ref 150–450)
RBC: 4.54 x10E6/uL (ref 3.77–5.28)
RDW: 13.1 % (ref 11.7–15.4)
WBC: 5.2 10*3/uL (ref 3.4–10.8)

## 2022-12-24 LAB — TSH: TSH: 0.773 u[IU]/mL (ref 0.450–4.500)

## 2022-12-24 LAB — LIPID PANEL
Chol/HDL Ratio: 3.6 ratio (ref 0.0–4.4)
Cholesterol, Total: 151 mg/dL (ref 100–199)
HDL: 42 mg/dL (ref >39)
LDL Chol Calc (NIH): 86 mg/dL (ref 0–99)
Triglycerides: 129 mg/dL (ref 0–149)
VLDL Cholesterol Cal: 23 mg/dL (ref 5–40)

## 2022-12-24 LAB — CMP14+EGFR
ALT: 20 IU/L (ref 0–32)
AST: 21 IU/L (ref 0–40)
Albumin/Globulin Ratio: 1.5 (ref 1.2–2.2)
Albumin: 4.4 g/dL (ref 3.8–4.8)
Alkaline Phosphatase: 86 IU/L (ref 44–121)
BUN/Creatinine Ratio: 24 (ref 12–28)
BUN: 33 mg/dL — ABNORMAL HIGH (ref 8–27)
Bilirubin Total: 0.4 mg/dL (ref 0.0–1.2)
CO2: 19 mmol/L — ABNORMAL LOW (ref 20–29)
Calcium: 10.5 mg/dL — ABNORMAL HIGH (ref 8.7–10.3)
Chloride: 102 mmol/L (ref 96–106)
Creatinine, Ser: 1.35 mg/dL — ABNORMAL HIGH (ref 0.57–1.00)
Globulin, Total: 3 g/dL (ref 1.5–4.5)
Glucose: 151 mg/dL — ABNORMAL HIGH (ref 70–99)
Potassium: 4.2 mmol/L (ref 3.5–5.2)
Sodium: 139 mmol/L (ref 134–144)
Total Protein: 7.4 g/dL (ref 6.0–8.5)
eGFR: 42 mL/min/{1.73_m2} — ABNORMAL LOW (ref >59)

## 2022-12-25 ENCOUNTER — Other Ambulatory Visit: Payer: Self-pay | Admitting: Family

## 2022-12-25 DIAGNOSIS — E1169 Type 2 diabetes mellitus with other specified complication: Secondary | ICD-10-CM

## 2022-12-28 ENCOUNTER — Ambulatory Visit (INDEPENDENT_AMBULATORY_CARE_PROVIDER_SITE_OTHER): Payer: Medicare Other | Admitting: *Deleted

## 2022-12-28 DIAGNOSIS — I10 Essential (primary) hypertension: Secondary | ICD-10-CM | POA: Diagnosis not present

## 2022-12-28 DIAGNOSIS — E1142 Type 2 diabetes mellitus with diabetic polyneuropathy: Secondary | ICD-10-CM

## 2022-12-28 NOTE — Patient Instructions (Addendum)
Please call the care guide team at [phone] if you need to cancel or reschedule your appointment.   If you are experiencing a Mental Health or [place] or need someone to talk to, please call the Suicide and Crisis Lifeline: 988 call the [place] National Suicide Prevention Lifeline: 7016301917 or TTY: 253-839-0845 TTY 714-578-9129) to talk to a trained counselor call 1-800-273-TALK (toll free, 24 hour hotline) go to [facility] [address], [place] [phone]) call the [facility]: [phone] call 911   Following is a copy of the CCM Program Consent:  CCM service includes personalized support from designated clinical staff supervised by the physician, including individualized plan of care and coordination with other care providers 24/7 contact phone numbers for assistance for urgent and routine care needs. Service will only be billed when office clinical staff spend 20 minutes or more in a month to coordinate care. Only one practitioner may furnish and bill the service in a calendar month. The patient may stop CCM services at amy time (effective at the end of the month) by phone call to the office staff. The patient will be responsible for cost sharing (co-pay) or up to 20% of the service fee (after annual deductible is met)  Following is a copy of your full provider care plan:   Goals Addressed             This Visit's Progress    CCM (DIABETES) EXPECTED OUTCOME: MONITOR, SELF-MANAGE AND REDUCE SYMPTOMS OF DIABETES       Current Barriers:  Knowledge Deficits related to management of Diabetes Chronic Disease Management support and education needs related to Diabetes management, diet Patient reports she checks CBG once daily fasting with ranges 100-200 with today's reading 163, states trying to be careful and not eat "too much potatoes and bread" , pt verbalizes understanding that AIC is 10.4 and  working towards goal of <7 Patient states she tries to follow a special diet, drinks tea with splenda Patient reports she continues trying to walks at times, cold weather makes it more difficult  Planned Interventions: Reviewed medications with patient and discussed importance of medication adherence;        Reviewed prescribed diet with patient carbohydrate modified, gave examples of food choices that will elevate blood sugar; Counseled on importance of regular laboratory monitoring as prescribed;        Advised patient, providing education and rationale, to check cbg once daily and record        call provider for findings outside established parameters;       Review of patient status, including review of consultants reports, relevant laboratory and other test results, and medications completed;       Reviewed importance of daily exercise, continue walking Reviewed examples of foods high in carbohydrates/ starchy foods that will elevate blood sugar  Symptom Management: Take medications as prescribed   Attend all scheduled provider appointments Call pharmacy for medication refills 3-7 days in advance of running out of medications Attend church or other social activities Perform all self care activities independently  Perform IADL's (shopping, preparing meals, housekeeping, managing finances) independently Call provider office for new concerns or questions  check blood sugar at prescribed times: once daily check feet daily for cuts, sores or redness enter blood sugar readings and medication or insulin into daily log take the blood sugar log to all doctor visits take the blood sugar meter to all doctor visits trim toenails straight across  drink 6 to 8 glasses of water each day fill half of plate with vegetables limit fast food meals to no more than 1 per week manage portion size prepare main meal at home 3 to 5 days each week read food labels for fat, fiber, carbohydrates and portion  size Too much potatoes, rice, pasta, bread at one meal with cause your blood sugar to go up Continue walking- keep up the good work!  Follow Up Plan: Telephone follow up appointment with care management team member scheduled for:  02/22/23 at 130 pm       CCM (HYPERTENSION)  EXPECTED OUTCOME:  MONITOR,SELF- MANAGE AND REDUCE SYMPTOMS OF HYPERTENSION       Current Barriers:  Knowledge Deficits related to Hypertension Care Coordination needs related to obtaining a blood pressure cuff in a patient with hypertension Chronic Disease Management support and education needs related to importance of monitoring blood pressure, adherence to low sodium diet and exercise Patient lives with her sister, is independent with ADL, IADL's, uses [employer] [place], does not have blood pressure cuff and does intend to check on through [facility] OTC benefits catalog, states she has been busy and has not checked Patient reports she continues to be mindful of sodium intake  Planned Interventions: Evaluation of current treatment plan related to hypertension self management and patient's adherence to plan as established by provider;   Reviewed prescribed diet low sodium  Reviewed medications with patient and discussed importance of compliance;  Provided assistance with obtaining home blood pressure monitor via [facility] benefits catalog (pt reports she is able to call and check on);  Counseled on the importance of exercise goals with target of 150 minutes per week Advised patient, providing education and rationale, to monitor blood pressure daily and record, calling PCP for findings outside established parameters;  Discussed complications of poorly controlled blood pressure such as heart disease, stroke, circulatory complications, vision complications, kidney impairment, sexual dysfunction;    Symptom Management: Take medications as prescribed   Attend all scheduled provider appointments Call pharmacy  for medication refills 3-7 days in advance of running out of medications Attend church or other social activities Perform all self care activities independently  Perform IADL's (shopping, preparing meals, housekeeping, managing finances) independently Call provider office for new concerns or questions  check blood pressure weekly choose a place to take my blood pressure (home, clinic or office, retail store) write blood pressure results in a log or diary learn about high blood pressure keep a blood pressure log take blood pressure log to all doctor appointments call doctor for signs and symptoms of high blood pressure take medications for blood pressure exactly as prescribed report new symptoms to your doctor eat more whole grains, fruits and vegetables, lean meats and healthy fats Please obtain blood pressure cuff [place] benefits catalog, you can also get a cuff at your local pharmacy Continue to follow a low sodium diet, please read food labels Avoid salty snacks, fast food  Follow Up Plan: Telephone follow up appointment with care management team member scheduled for:  02/22/23 at 130 pm       CCM (KIDNEY FAILURE)  EXPECTED OUTCOME:  MONITOR, SELF-MANAGE AND REDUCE SYMPTOMS OF KIDNEY FAILURE       Current Barriers:  Knowledge Deficits related to Kidney Failure/ CKD Chronic Disease Management support and education needs related to Kidney Failure/ CKD  Planned Interventions: Reviewed importance of taking medications as prescribed Reviewed low sodium diet, foods high in  sodium to limit/ avoid Reinforced importance of keeping blood pressure and blood sugar within normal limits Reinforced impact that hyperglycemia and hypertension have on the kidneys/ body Reviewed healthy ways of preparing food  Symptom Management: Take medications as prescribed   Attend all scheduled provider appointments Call pharmacy for medication refills 3-7 days in advance of running out of  medications Attend church or other social activities Perform all self care activities independently  Perform IADL's (shopping, preparing meals, housekeeping, managing finances) independently Call provider office for new concerns or questions  Drink plenty of fluids- preferably water Continue to walk as much as you are able Keep blood sugar and blood pressure within normal limits Choose fresh and frozen foods when possible Bake/broil instead of frying  Follow Up Plan: Telephone follow up appointment with care management team member scheduled for: 02/22/23 at 130 pm             Patient verbalizes understanding of instructions and care plan provided today and agrees to view in [place]. Active MyChart status and patient understanding of how to access instructions and care plan via MyChart confirmed with patient.     Telephone follow up appointment with care management team member scheduled for:  02/22/23 at 130 pm

## 2022-12-28 NOTE — Chronic Care Management (AMB) (Signed)
Chronic Care Management   CCM RN Visit Note  12/28/2022 Name: Renee [name] MRN: [id number] DOB: 09-15-1951  Subjective: Renee [name] is a 72 y.o. year old female who is a primary care patient of Renee Balloon, FNP. The patient was referred to the Chronic Care Management team for assistance with care management needs subsequent to provider initiation of CCM services and plan of care.    Today's Visit:  Engaged with patient by telephone for follow up visit.        Goals Addressed             This Visit's Progress    CCM (DIABETES) EXPECTED OUTCOME: MONITOR, SELF-MANAGE AND REDUCE SYMPTOMS OF DIABETES       Current Barriers:  Knowledge Deficits related to management of Diabetes Chronic Disease Management support and education needs related to Diabetes management, diet Patient reports she checks CBG once daily fasting with ranges 100-200 with today's reading 163, states trying to be careful and not eat "too much potatoes and bread" , pt verbalizes understanding that AIC is 10.4 and working towards goal of <7 Patient states she tries to follow a special diet, drinks tea with splenda Patient reports she continues trying to walks at times, cold weather makes it more difficult  Planned Interventions: Reviewed medications with patient and discussed importance of medication adherence;        Reviewed prescribed diet with patient carbohydrate modified, gave examples of food choices that will elevate blood sugar; Counseled on importance of regular laboratory monitoring as prescribed;        Advised patient, providing education and rationale, to check cbg once daily and record        call provider for findings outside established parameters;       Review of patient status, including review of consultants reports, relevant laboratory and other test results, and medications completed;       Reviewed importance of daily exercise, continue walking Reviewed examples of foods high in  carbohydrates/ starchy foods that will elevate blood sugar  Symptom Management: Take medications as prescribed   Attend all scheduled provider appointments Call pharmacy for medication refills 3-7 days in advance of running out of medications Attend church or other social activities Perform all self care activities independently  Perform IADL's (shopping, preparing meals, housekeeping, managing finances) independently Call provider office for new concerns or questions  check blood sugar at prescribed times: once daily check feet daily for cuts, sores or redness enter blood sugar readings and medication or insulin into daily log take the blood sugar log to all doctor visits take the blood sugar meter to all doctor visits trim toenails straight across drink 6 to 8 glasses of water each day fill half of plate with vegetables limit fast food meals to no more than 1 per week manage portion size prepare main meal at home 3 to 5 days each week read food labels for fat, fiber, carbohydrates and portion size Too much potatoes, rice, pasta, bread at one meal with cause your blood sugar to go up Continue walking- keep up the good work!  Follow Up Plan: Telephone follow up appointment with care management team member scheduled for:  02/22/23 at 130 pm       CCM (HYPERTENSION)  EXPECTED OUTCOME:  MONITOR,SELF- MANAGE AND REDUCE SYMPTOMS OF HYPERTENSION       Current Barriers:  Knowledge Deficits related to Hypertension Care Coordination needs related to obtaining a blood pressure cuff in a patient  with hypertension Chronic Disease Management support and education needs related to importance of monitoring blood pressure, adherence to low sodium diet and exercise Patient lives with her sister, is independent with ADL, IADL's, [place] [employer] [place], does not have blood pressure cuff and does intend to check on through [place] benefits catalog, states she has been busy and has not  checked Patient reports she continues to be mindful of sodium intake  Planned Interventions: Evaluation of current treatment plan related to hypertension self management and patient's adherence to plan as established by provider;   Reviewed prescribed diet low sodium  Reviewed medications with patient and discussed importance of compliance;  Provided assistance with obtaining home blood pressure monitor via [facility] benefits catalog (pt reports she is able to call and check on);  Counseled on the importance of exercise goals with target of 150 minutes per week Advised patient, providing education and rationale, to monitor blood pressure daily and record, calling PCP for findings outside established parameters;  Discussed complications of poorly controlled blood pressure such as heart disease, stroke, circulatory complications, vision complications, kidney impairment, sexual dysfunction;    Symptom Management: Take medications as prescribed   Attend all scheduled provider appointments Call pharmacy for medication refills 3-7 days in advance of running out of medications Attend church or other social activities Perform all self care activities independently  Perform IADL's (shopping, preparing meals, housekeeping, managing finances) independently Call provider office for new concerns or questions  check blood pressure weekly choose a place to take my blood pressure (home, clinic or office, retail store) write blood pressure results in a log or diary learn about high blood pressure keep a blood pressure log take blood pressure log to all doctor appointments call doctor for signs and symptoms of high blood pressure take medications for blood pressure exactly as prescribed report new symptoms to your doctor eat more whole grains, fruits and vegetables, lean meats and healthy fats Please obtain blood pressure cuff [place] benefits catalog, you can also get a cuff at your  local pharmacy Continue to follow a low sodium diet, please read food labels Avoid salty snacks, fast food  Follow Up Plan: Telephone follow up appointment with care management team member scheduled for:  02/22/23 at 130 pm       CCM (KIDNEY FAILURE)  EXPECTED OUTCOME:  MONITOR, SELF-MANAGE AND REDUCE SYMPTOMS OF KIDNEY FAILURE       Current Barriers:  Knowledge Deficits related to Kidney Failure/ CKD Chronic Disease Management support and education needs related to Kidney Failure/ CKD  Planned Interventions: Reviewed importance of taking medications as prescribed Reviewed low sodium diet, foods high in sodium to limit/ avoid Reinforced importance of keeping blood pressure and blood sugar within normal limits Reinforced impact that hyperglycemia and hypertension have on the kidneys/ body Reviewed healthy ways of preparing food  Symptom Management: Take medications as prescribed   Attend all scheduled provider appointments Call pharmacy for medication refills 3-7 days in advance of running out of medications Attend church or other social activities Perform all self care activities independently  Perform IADL's (shopping, preparing meals, housekeeping, managing finances) independently Call provider office for new concerns or questions  Drink plenty of fluids- preferably water Continue to walk as much as you are able Keep blood sugar and blood pressure within normal limits Choose fresh and frozen foods when possible Bake/broil instead of frying  Follow Up Plan: Telephone follow up appointment with care management team member  scheduled for: 02/22/23 at 130 pm             Plan:Telephone follow up appointment with care management team member scheduled for:  02/22/23 at 130 pm  Jacqlyn Larsen [facility], BSN RN Case Manager [place] [phone]

## 2022-12-28 NOTE — Addendum Note (Signed)
Addended by: Evelina Dun A on: 12/28/2022 08:17 AM   Modules accepted: Level of Service

## 2023-01-04 DIAGNOSIS — M79676 Pain in unspecified toe(s): Secondary | ICD-10-CM | POA: Diagnosis not present

## 2023-01-04 DIAGNOSIS — L84 Corns and callosities: Secondary | ICD-10-CM | POA: Diagnosis not present

## 2023-01-04 DIAGNOSIS — E1151 Type 2 diabetes mellitus with diabetic peripheral angiopathy without gangrene: Secondary | ICD-10-CM | POA: Diagnosis not present

## 2023-01-04 DIAGNOSIS — B351 Tinea unguium: Secondary | ICD-10-CM | POA: Diagnosis not present

## 2023-01-26 DIAGNOSIS — I10 Essential (primary) hypertension: Secondary | ICD-10-CM

## 2023-01-26 DIAGNOSIS — E1159 Type 2 diabetes mellitus with other circulatory complications: Secondary | ICD-10-CM

## 2023-02-11 NOTE — Plan of Care (Signed)
Chronic Care Management Provider Comprehensive Care Plan    02/11/2023 Name: Renee [name] MRN: [id number] DOB: 1951/06/05  Referral to Chronic Care Management (CCM) services was placed by Provider:  Evelina Dun FNP on Date: 09/16/22.  Chronic Condition 1: HYPERTENSION Provider Assessment and Plan Hypertension associated with diabetes ([place]) - CMP14+EGFR - AMB Referral to [place]   Expected Outcome/Goals Addressed This Visit (Provider CCM goals/Provider Assessment and plan  CCM (HYPERTENSION) EXPECTED OUTCOME: MONITOR, SELF-MANAGE AND REDUCE SYMPTOMS OF HYPERTENSION  Symptom Management Condition 1: Take medications as prescribed   Attend all scheduled provider appointments Call pharmacy for medication refills 3-7 days in advance of running out of medications Attend church or other social activities Perform all self care activities independently  Perform IADL's (shopping, preparing meals, housekeeping, managing finances) independently Call provider office for new concerns or questions  check blood pressure 3 times per week choose a place to take my blood pressure (home, clinic or office, retail store) keep a blood pressure log take blood pressure log to all doctor appointments call doctor for signs and symptoms of high blood pressure take medications for blood pressure exactly as prescribed eat more whole grains, fruits and vegetables, lean meats and healthy fats Please obtain blood pressure cuff [facility] benefits catalog Look over education sent via My Chart- low sodium diet  Chronic Condition 2: DIABETES Provider Assessment and Plan. Type 2 diabetes mellitus with diabetic polyneuropathy, without long-term current use of insulin (HCC) - Bayer DCA Hb A1c Waived - CMP14+EGFR - Microalbumin / creatinine urine ratio - AMB Referral to [place]   Expected Outcome/Goals Addressed This Visit (Provider CCM goals/Provider Assessment and  plan  CCM (DIABETES) EXPECTED OUTCOME: MONITOR, SELF- MANAGE AND REDUCE SYMPTOMS OF DIABETES  Symptom Management Condition 2: Take medications as prescribed   Attend all scheduled provider appointments Call pharmacy for medication refills 3-7 days in advance of running out of medications Attend church or other social activities Perform all self care activities independently  Perform IADL's (shopping, preparing meals, housekeeping, managing finances) independently Call provider office for new concerns or questions  check blood sugar at prescribed times: once daily check feet daily for cuts, sores or redness enter blood sugar readings and medication or insulin into daily log take the blood sugar log to all doctor visits take the blood sugar meter to all doctor visits trim toenails straight across fill half of plate with vegetables manage portion size  Problem List Patient Active Problem List   Diagnosis Date Noted   Non-compliance 03/08/2022   Occipital neuralgia of right side 10/16/2020   Essential hypertension, benign 02/15/2019   Overweight (BMI 25.0-29.9) 01/03/2017   Constipation 01/03/2017   Diabetic peripheral neuropathy ([place]) 09/30/2016   Diabetes mellitus ([place]) 07/05/2016   History of breast cancer in female 04/02/2016   Family history of breast cancer in sister 04/02/2016   GERD (gastroesophageal reflux disease) 02/05/2014   Hyperlipidemia associated with type 2 diabetes mellitus ([place]) 02/05/2014   Hypertension associated with diabetes ([place]) 02/05/2014   History of CVA (cerebrovascular accident) 02/05/2014    Medication Management  Current Outpatient Medications:    aspirin 81 MG tablet, Take 81 mg by mouth daily., Disp: , Rfl:    Blood Glucose Calibration (ONETOUCH VERIO) SOLN, Use as needed to calibrate glucometer, Disp: 1 each, Rfl: 0   Blood Glucose Monitoring Suppl ([place]) w/Device KIT, Use to check blood sugar twice daily, Disp: 1 kit,  Rfl: 0  Cholecalciferol (VITAMIN D3) 10 MCG (400 UNIT) CAPS, Take by mouth., Disp: , Rfl:    dapagliflozin propanediol (FARXIGA) 10 MG TABS tablet, Take 1 tablet (10 mg total) by mouth daily before breakfast., Disp: 90 tablet, Rfl: 1   docusate sodium (COLACE) 100 MG capsule, Take 100 mg by mouth daily., Disp: , Rfl:    glipiZIDE (GLUCOTROL) 10 MG tablet, Take 1 tablet (10 mg total) by mouth 2 (two) times daily before a meal., Disp: 180 tablet, Rfl: 2   glucose blood (ONETOUCH VERIO) test strip, USE TO CHECK BLOOD SUGAR TWICE DAILY Dx E11.9, Disp: 200 each, Rfl: 3   ONETOUCH DELICA LANCETS 99991111 MISC, Use to check BG up to bid.  Dx: E11.65 type 2 DM with insulin therapy, uncontrolled, Disp: 100 each, Rfl: 2   sitaGLIPtin (JANUVIA) 50 MG tablet, Take 50 mg by mouth daily., Disp: , Rfl:    atorvastatin (LIPITOR) 40 MG tablet, TAKE 1 TABLET BY MOUTH DAILY, Disp: 90 tablet, Rfl: 3   gabapentin (NEURONTIN) 400 MG capsule, TAKE 1 CAPSULE BY MOUTH 3 TIMES  DAILY, Disp: 270 capsule, Rfl: 0   omeprazole (PRILOSEC) 20 MG capsule, TAKE 1 CAPSULE BY MOUTH DAILY, Disp: 90 capsule, Rfl: 0   valsartan-hydrochlorothiazide (DIOVAN-HCT) 160-25 MG tablet, TAKE 1 TABLET BY MOUTH DAILY, Disp: 90 tablet, Rfl: 0  Cognitive Assessment Identity Confirmed: : Name; DOB Cognitive Status: Normal   Functional Assessment No data recorded  Caregiver Assessment  No data recorded  Planned Interventions  Evaluation of current treatment plan related to hypertension self management and patient's adherence to plan as established by provider;   Reviewed prescribed diet low sodium and education sent via My Chart Reviewed medications with patient and discussed importance of compliance;  Provided assistance with obtaining home blood pressure monitor via [facility] benefits catalog (pt reports she is able to call and check on);  Counseled on the importance of exercise goals with target of 150 minutes per week Discussed  plans with patient for ongoing care management follow up and provided patient with direct contact information for care management team; Discussed complications of poorly controlled blood pressure such as heart disease, stroke, circulatory complications, vision complications, kidney impairment, sexual dysfunction;  Screening for signs and symptoms of depression related to chronic disease state;  Assessed social determinant of health barriers;  Provided education to patient about basic DM disease process; Reviewed medications with patient and discussed importance of medication adherence;        Reviewed prescribed diet with patient carbohydrate modified, gave examples of food choices that will elevate blood sugar; Discussed plans with patient for ongoing care management follow up and provided patient with direct contact information for care management team;      Provided patient with written educational materials related to hypo and hyperglycemia and importance of correct treatment;       Review of patient status, including review of consultants reports, relevant laboratory and other test results, and medications completed;       Screening for signs and symptoms of depression related to chronic disease state;        Assessed social determinant of health barriers;        Reviewed importance of daily exercise, continue walking  Interaction and coordination with outside resources, practitioners, and providers See CCM Referral  Care Plan: Available in MyChart

## 2023-02-22 ENCOUNTER — Ambulatory Visit (INDEPENDENT_AMBULATORY_CARE_PROVIDER_SITE_OTHER): Payer: Medicare Other | Admitting: *Deleted

## 2023-02-22 DIAGNOSIS — I10 Essential (primary) hypertension: Secondary | ICD-10-CM

## 2023-02-22 DIAGNOSIS — E1142 Type 2 diabetes mellitus with diabetic polyneuropathy: Secondary | ICD-10-CM

## 2023-02-22 NOTE — Chronic Care Management (AMB) (Signed)
Chronic Care Management   CCM RN Visit Note  02/22/2023 Name: Renee [name] MRN: [id number] DOB: 11/18/1951  Subjective: Renee [name] is a 72 y.o. year old female who is a primary care patient of Sharion Balloon, FNP. The patient was referred to the Chronic Care Management team for assistance with care management needs subsequent to provider initiation of CCM services and plan of care.    Today's Visit:  Engaged with patient by telephone for follow up visit.        Goals Addressed             This Visit's Progress    CCM (DIABETES) EXPECTED OUTCOME: MONITOR, SELF-MANAGE AND REDUCE SYMPTOMS OF DIABETES       Current Barriers:  Knowledge Deficits related to management of Diabetes Chronic Disease Management support and education needs related to Diabetes management, diet Patient reports she checks CBG once daily fasting with ranges 100-200 with today's reading 264, states last night she ate sugar free ice cream with chocolate chip cookie and potato chips , pt verbalizes understanding that AIC is 10.4 and working towards goal of <7 Patient states she tries to follow a special diet, drinks tea with splenda, sugar free diet soda Patient reports she continues trying to walks at times, cold weather makes it more difficult  Planned Interventions: Reviewed medications with patient and discussed importance of medication adherence;        Reviewed prescribed diet with patient carbohydrate modified, gave examples of food choices that will elevate blood sugar; Counseled on importance of regular laboratory monitoring as prescribed;        Advised patient, providing education and rationale, to check cbg once daily and record        call provider for findings outside established parameters;       Review of patient status, including review of consultants reports, relevant laboratory and other test results, and medications completed;       Advised patient to discuss issues with blood sugar,  medications with provider;      Reinforced importance of daily exercise, continue walking Reinforced examples of foods high in carbohydrates/ starchy foods that will elevate blood sugar  Symptom Management: Take medications as prescribed   Attend all scheduled provider appointments Call pharmacy for medication refills 3-7 days in advance of running out of medications Attend church or other social activities Perform all self care activities independently  Perform IADL's (shopping, preparing meals, housekeeping, managing finances) independently Call provider office for new concerns or questions  check blood sugar at prescribed times: once daily check feet daily for cuts, sores or redness enter blood sugar readings and medication or insulin into daily log take the blood sugar log to all doctor visits take the blood sugar meter to all doctor visits trim toenails straight across drink 6 to 8 glasses of water each day fill half of plate with vegetables limit fast food meals to no more than 1 per week manage portion size prepare main meal at home 3 to 5 days each week read food labels for fat, fiber, carbohydrates and portion size keep feet up while sitting wash and dry feet carefully every day Too much potatoes, rice, pasta, bread at one meal with cause your blood sugar to go up Avoid concentrated sweets Continue walking- keep up the good work!  Follow Up Plan: Telephone follow up appointment with care management team member scheduled for:  04/25/23 at 9 am       CCM (HYPERTENSION)  EXPECTED OUTCOME:  MONITOR,SELF- MANAGE AND REDUCE SYMPTOMS OF HYPERTENSION       Current Barriers:  Knowledge Deficits related to Hypertension Care Coordination needs related to obtaining a blood pressure cuff in a patient with hypertension Chronic Disease Management support and education needs related to importance of monitoring blood pressure, adherence to low sodium diet and exercise Patient lives with  her sister, is independent with ADL, IADL's, [place] [employer] [place], patient states she obtained blood pressure cuff from [employer], states she has checked blood pressure a few times Patient reports she continues to be mindful of sodium intake  Planned Interventions: Evaluation of current treatment plan related to hypertension self management and patient's adherence to plan as established by provider;   Reviewed prescribed diet low sodium  Reviewed medications with patient and discussed importance of compliance;  Counseled on the importance of exercise goals with target of 150 minutes per week Advised patient, providing education and rationale, to monitor blood pressure daily and record, calling PCP for findings outside established parameters;  Discussed complications of poorly controlled blood pressure such as heart disease, stroke, circulatory complications, vision complications, kidney impairment, sexual dysfunction;   Symptom Management: Take medications as prescribed   Attend all scheduled provider appointments Call pharmacy for medication refills 3-7 days in advance of running out of medications Attend church or other social activities Perform all self care activities independently  Perform IADL's (shopping, preparing meals, housekeeping, managing finances) independently Call provider office for new concerns or questions  check blood pressure weekly choose a place to take my blood pressure (home, clinic or office, retail store) write blood pressure results in a log or diary learn about high blood pressure keep a blood pressure log take blood pressure log to all doctor appointments call doctor for signs and symptoms of high blood pressure take medications for blood pressure exactly as prescribed report new symptoms to your doctor eat more whole grains, fruits and vegetables, lean meats and healthy fats Continue to check your blood pressure,  keep a log Continue to follow a low sodium diet Avoid salty snacks, fast food Read food labels  Follow Up Plan: Telephone follow up appointment with care management team member scheduled for:   04/25/23 at 9 am       COMPLETED: CCM (KIDNEY FAILURE)  EXPECTED OUTCOME:  MONITOR, SELF-MANAGE AND REDUCE SYMPTOMS OF KIDNEY FAILURE       Current Barriers:  Knowledge Deficits related to Kidney Failure/ CKD Chronic Disease Management support and education needs related to Kidney Failure/ CKD  Planned Interventions: Reviewed importance of taking medications as prescribed Reviewed low sodium diet, foods high in sodium to limit/ avoid Reinforced importance of keeping blood pressure and blood sugar within normal limits Reinforced impact that hyperglycemia and hypertension have on the kidneys/ body Reviewed healthy ways of preparing food  Symptom Management: Take medications as prescribed   Attend all scheduled provider appointments Call pharmacy for medication refills 3-7 days in advance of running out of medications Attend church or other social activities Perform all self care activities independently  Perform IADL's (shopping, preparing meals, housekeeping, managing finances) independently Call provider office for new concerns or questions  Drink plenty of fluids- preferably water Continue to walk as much as you are able Keep blood sugar and blood pressure within normal limits Choose fresh and frozen foods when possible Bake/broil instead of frying  Follow Up Plan: Care plan complete  Plan:Telephone follow up appointment with care management team member scheduled for:  04/25/23 at 9 am  Jacqlyn Larsen [facility], BSN RN Case Manager [place] [phone]

## 2023-02-22 NOTE — Patient Instructions (Signed)
Please call the care guide team at [phone] if you need to cancel or reschedule your appointment.   If you are experiencing a Mental Health or [place] or need someone to talk to, please call the Suicide and Crisis Lifeline: 988 call the [place] National Suicide Prevention Lifeline: 678 294 2638 or TTY: 425 022 9101 TTY [phone]) to talk to a trained counselor call 1-800-273-TALK (toll free, 24 hour hotline) go to [facility] [address], [place] [phone]) call the [facility]: [phone] call 911   Following is a copy of the CCM Program Consent:  CCM service includes personalized support from designated clinical staff supervised by the physician, including individualized plan of care and coordination with other care providers 24/7 contact phone numbers for assistance for urgent and routine care needs. Service will only be billed when office clinical staff spend 20 minutes or more in a month to coordinate care. Only one practitioner may furnish and bill the service in a calendar month. The patient may stop CCM services at amy time (effective at the end of the month) by phone call to the office staff. The patient will be responsible for cost sharing (co-pay) or up to 20% of the service fee (after annual deductible is met)  Following is a copy of your full provider care plan:   Goals Addressed             This Visit's Progress    CCM (DIABETES) EXPECTED OUTCOME: MONITOR, SELF-MANAGE AND REDUCE SYMPTOMS OF DIABETES       Current Barriers:  Knowledge Deficits related to management of Diabetes Chronic Disease Management support and education needs related to Diabetes management, diet Patient reports she checks CBG once daily fasting with ranges 100-200 with today's reading 264, states last night she ate sugar free ice cream with chocolate chip cookie and potato chips , pt verbalizes understanding that  AIC is 10.4 and working towards goal of <7 Patient states she tries to follow a special diet, drinks tea with splenda, sugar free diet soda Patient reports she continues trying to walks at times, cold weather makes it more difficult  Planned Interventions: Reviewed medications with patient and discussed importance of medication adherence;        Reviewed prescribed diet with patient carbohydrate modified, gave examples of food choices that will elevate blood sugar; Counseled on importance of regular laboratory monitoring as prescribed;        Advised patient, providing education and rationale, to check cbg once daily and record        call provider for findings outside established parameters;       Review of patient status, including review of consultants reports, relevant laboratory and other test results, and medications completed;       Advised patient to discuss issues with blood sugar, medications with provider;      Reinforced importance of daily exercise, continue walking Reinforced examples of foods high in carbohydrates/ starchy foods that will elevate blood sugar  Symptom Management: Take medications as prescribed   Attend all scheduled provider appointments Call pharmacy for medication refills 3-7 days in advance of running out of medications Attend church or other social activities Perform all self care activities independently  Perform IADL's (shopping, preparing meals, housekeeping, managing finances) independently Call provider office for new concerns or questions  check blood sugar at prescribed times: once daily check feet daily for cuts, sores or redness enter blood sugar readings and medication or insulin into daily  log take the blood sugar log to all doctor visits take the blood sugar meter to all doctor visits trim toenails straight across drink 6 to 8 glasses of water each day fill half of plate with vegetables limit fast food meals to no more than 1 per  week manage portion size prepare main meal at home 3 to 5 days each week read food labels for fat, fiber, carbohydrates and portion size keep feet up while sitting wash and dry feet carefully every day Too much potatoes, rice, pasta, bread at one meal with cause your blood sugar to go up Avoid concentrated sweets Continue walking- keep up the good work!  Follow Up Plan: Telephone follow up appointment with care management team member scheduled for:  04/25/23 at 9 am       CCM (HYPERTENSION)  EXPECTED OUTCOME:  MONITOR,SELF- MANAGE AND REDUCE SYMPTOMS OF HYPERTENSION       Current Barriers:  Knowledge Deficits related to Hypertension Care Coordination needs related to obtaining a blood pressure cuff in a patient with hypertension Chronic Disease Management support and education needs related to importance of monitoring blood pressure, adherence to low sodium diet and exercise Patient lives with her sister, is independent with ADL, IADL's, [place] [employer] [place], patient states she obtained blood pressure cuff from [employer], states she has checked blood pressure a few times Patient reports she continues to be mindful of sodium intake  Planned Interventions: Evaluation of current treatment plan related to hypertension self management and patient's adherence to plan as established by provider;   Reviewed prescribed diet low sodium  Reviewed medications with patient and discussed importance of compliance;  Counseled on the importance of exercise goals with target of 150 minutes per week Advised patient, providing education and rationale, to monitor blood pressure daily and record, calling PCP for findings outside established parameters;  Discussed complications of poorly controlled blood pressure such as heart disease, stroke, circulatory complications, vision complications, kidney impairment, sexual dysfunction;   Symptom Management: Take  medications as prescribed   Attend all scheduled provider appointments Call pharmacy for medication refills 3-7 days in advance of running out of medications Attend church or other social activities Perform all self care activities independently  Perform IADL's (shopping, preparing meals, housekeeping, managing finances) independently Call provider office for new concerns or questions  check blood pressure weekly choose a place to take my blood pressure (home, clinic or office, retail store) write blood pressure results in a log or diary learn about high blood pressure keep a blood pressure log take blood pressure log to all doctor appointments call doctor for signs and symptoms of high blood pressure take medications for blood pressure exactly as prescribed report new symptoms to your doctor eat more whole grains, fruits and vegetables, lean meats and healthy fats Continue to check your blood pressure, keep a log Continue to follow a low sodium diet Avoid salty snacks, fast food Read food labels  Follow Up Plan: Telephone follow up appointment with care management team member scheduled for:   04/25/23 at 9 am       COMPLETED: CCM (KIDNEY FAILURE)  EXPECTED OUTCOME:  MONITOR, SELF-MANAGE AND REDUCE SYMPTOMS OF KIDNEY FAILURE       Current Barriers:  Knowledge Deficits related to Kidney Failure/ CKD Chronic Disease Management support and education needs related to Kidney Failure/ CKD  Planned Interventions: Reviewed importance of taking medications as prescribed Reviewed low sodium diet, foods high in sodium  to limit/ avoid Reinforced importance of keeping blood pressure and blood sugar within normal limits Reinforced impact that hyperglycemia and hypertension have on the kidneys/ body Reviewed healthy ways of preparing food  Symptom Management: Take medications as prescribed   Attend all scheduled provider appointments Call pharmacy for medication refills 3-7 days in advance of  running out of medications Attend church or other social activities Perform all self care activities independently  Perform IADL's (shopping, preparing meals, housekeeping, managing finances) independently Call provider office for new concerns or questions  Drink plenty of fluids- preferably water Continue to walk as much as you are able Keep blood sugar and blood pressure within normal limits Choose fresh and frozen foods when possible Bake/broil instead of frying  Follow Up Plan: Telephone follow up appointment with care management team member scheduled for: 02/22/23 at 130 pm             Patient verbalizes understanding of instructions and care plan provided today and agrees to view in [place]. Active MyChart status and patient understanding of how to access instructions and care plan via MyChart confirmed with patient.     Telephone follow up appointment with care management team member scheduled for:   04/25/23 at 9 am  How to Take Your Blood Pressure Blood pressure is a measurement of how strongly your blood is pressing against the walls of your arteries. Arteries are blood vessels that carry blood from your heart throughout your body. Your health care provider takes your blood pressure at each office visit. You can also take your own blood pressure at home with a blood pressure monitor. You may need to take your own blood pressure to: Confirm a diagnosis of high blood pressure (hypertension). Monitor your blood pressure over time. Make sure your blood pressure medicine is working. Supplies needed: Blood pressure monitor. A chair to sit in. This should be a chair where you can sit upright with your back supported. Do not sit on a soft couch or an armchair. Table or desk. Small notebook and pencil or pen. How to prepare To get the most accurate reading, avoid the following for 30 minutes before you check your blood pressure: Drinking caffeine. Drinking  alcohol. Eating. Smoking. Exercising. Five minutes before you check your blood pressure: Use the bathroom and urinate so that you have an empty bladder. Sit quietly in a chair. Do not talk. How to take your blood pressure To check your blood pressure, follow the instructions in the manual that came with your blood pressure monitor. If you have a digital blood pressure monitor, the instructions may be as follows: Sit up straight in a chair. Place your feet on the floor. Do not cross your ankles or legs. Rest your left arm at the level of your heart on a table or desk or on the arm of a chair. Pull up your shirt sleeve. Wrap the blood pressure cuff around the upper part of your left arm, 1 inch (2.5 cm) above your elbow. It is best to wrap the cuff around bare skin. Fit the cuff snugly, but not too tightly, around your arm. You should be able to place only one finger between the cuff and your arm. Position the cord so that it rests in the bend of your elbow. Press the power button. Sit quietly while the cuff inflates and deflates. Read the digital reading on the monitor screen and write the numbers down (record them) in a notebook. Wait 2-3 minutes, then repeat the  steps, starting at step 1. What does my blood pressure reading mean? A blood pressure reading consists of a higher number over a lower number. Ideally, your blood pressure should be below 120/80. The first ("top") number is called the systolic pressure. It is a measure of the pressure in your arteries as your heart beats. The second ("bottom") number is called the diastolic pressure. It is a measure of the pressure in your arteries as the heart relaxes. Blood pressure is classified into four stages. The following are the stages for adults who do not have a short-term serious illness or a chronic condition. Systolic pressure and diastolic pressure are measured in a unit called mm Hg (millimeters of mercury).  Normal Systolic pressure:  below 120. Diastolic pressure: below 80. Elevated Systolic pressure: [id number]. Diastolic pressure: below 80. Hypertension stage 1 Systolic pressure: 0000000. Diastolic pressure: [id number]. Hypertension stage 2 Systolic pressure: [id number] or above. Diastolic pressure: 90 or above. You can have elevated blood pressure or hypertension even if only the systolic or only the diastolic number in your reading is higher than normal. Follow these instructions at home: Medicines Take over-the-counter and prescription medicines only as told by your health care provider. Tell your health care provider if you are having any side effects from blood pressure medicine. General instructions Check your blood pressure as often as recommended by your health care provider. Check your blood pressure at the same time every day. Take your monitor to the next appointment with your health care provider to make sure that: You are using it correctly. It provides accurate readings. Understand what your goal blood pressure numbers are. Keep all follow-up visits. This is important. General tips Your health care provider can suggest a reliable monitor that will meet your needs. There are several types of home blood pressure monitors. Choose a monitor that has an arm cuff. Do not choose a monitor that measures your blood pressure from your wrist or finger. Choose a cuff that wraps snugly, not too tight or too loose, around your upper arm. You should be able to fit only one finger between your arm and the cuff. You can buy a blood pressure monitor at most drugstores or online. Where to find more information American Heart Association: www.heart.org Contact a health care provider if: Your blood pressure is consistently high. Your blood pressure is suddenly low. Get help right away if: Your systolic blood pressure is higher than 180. Your diastolic blood pressure is higher than 120. These symptoms may be an emergency. Get  help right away. Call 911. Do not wait to see if the symptoms will go away. Do not drive yourself to the hospital. Summary Blood pressure is a measurement of how strongly your blood is pressing against the walls of your arteries. A blood pressure reading consists of a higher number over a lower number. Ideally, your blood pressure should be below 120/80. Check your blood pressure at the same time every day. Avoid caffeine, alcohol, smoking, and exercise for 30 minutes prior to checking your blood pressure. These agents can affect the accuracy of the blood pressure reading. This information is not intended to replace advice given to you by your health care provider. Make sure you discuss any questions you have with your health care provider. Document Revised: 08/27/2021 Document Reviewed: 08/27/2021 Elsevier Patient Education  [place].

## 2023-02-24 DIAGNOSIS — E1159 Type 2 diabetes mellitus with other circulatory complications: Secondary | ICD-10-CM | POA: Diagnosis not present

## 2023-02-24 DIAGNOSIS — I10 Essential (primary) hypertension: Secondary | ICD-10-CM

## 2023-03-03 ENCOUNTER — Telehealth: Payer: Self-pay | Admitting: Family

## 2023-03-04 NOTE — Telephone Encounter (Signed)
Will mail [employer] application to patients home.

## 2023-03-15 DIAGNOSIS — B351 Tinea unguium: Secondary | ICD-10-CM | POA: Diagnosis not present

## 2023-03-15 DIAGNOSIS — L84 Corns and callosities: Secondary | ICD-10-CM | POA: Diagnosis not present

## 2023-03-15 DIAGNOSIS — M79676 Pain in unspecified toe(s): Secondary | ICD-10-CM | POA: Diagnosis not present

## 2023-03-15 DIAGNOSIS — E1151 Type 2 diabetes mellitus with diabetic peripheral angiopathy without gangrene: Secondary | ICD-10-CM | POA: Diagnosis not present

## 2023-03-16 ENCOUNTER — Telehealth: Payer: Self-pay | Admitting: Family

## 2023-03-16 NOTE — Telephone Encounter (Signed)
Pt wants to let Alyse Low know that she will not take sitaGLIPtin (JANUVIA) 50 MG tablet no more. It is not working.

## 2023-03-17 NOTE — Telephone Encounter (Signed)
Pt needs to be seen and strict low carb diet.

## 2023-03-17 NOTE — Telephone Encounter (Signed)
Patient aware and verbalized understanding. °

## 2023-03-20 ENCOUNTER — Other Ambulatory Visit: Payer: Self-pay | Admitting: Family

## 2023-03-20 DIAGNOSIS — K2100 Gastro-esophageal reflux disease with esophagitis, without bleeding: Secondary | ICD-10-CM

## 2023-03-20 DIAGNOSIS — E1142 Type 2 diabetes mellitus with diabetic polyneuropathy: Secondary | ICD-10-CM

## 2023-03-24 ENCOUNTER — Encounter: Payer: Self-pay | Admitting: Family

## 2023-03-24 ENCOUNTER — Ambulatory Visit (INDEPENDENT_AMBULATORY_CARE_PROVIDER_SITE_OTHER): Payer: Medicare Other | Admitting: Family

## 2023-03-24 VITALS — BP 125/72 | HR 78 | Temp 97.4°F | Ht 66.0 in | Wt 163.2 lb

## 2023-03-24 DIAGNOSIS — E785 Hyperlipidemia, unspecified: Secondary | ICD-10-CM | POA: Diagnosis not present

## 2023-03-24 DIAGNOSIS — I10 Essential (primary) hypertension: Secondary | ICD-10-CM | POA: Diagnosis not present

## 2023-03-24 DIAGNOSIS — I152 Hypertension secondary to endocrine disorders: Secondary | ICD-10-CM

## 2023-03-24 DIAGNOSIS — K5900 Constipation, unspecified: Secondary | ICD-10-CM

## 2023-03-24 DIAGNOSIS — E1159 Type 2 diabetes mellitus with other circulatory complications: Secondary | ICD-10-CM

## 2023-03-24 DIAGNOSIS — E1142 Type 2 diabetes mellitus with diabetic polyneuropathy: Secondary | ICD-10-CM | POA: Diagnosis not present

## 2023-03-24 DIAGNOSIS — K2100 Gastro-esophageal reflux disease with esophagitis, without bleeding: Secondary | ICD-10-CM

## 2023-03-24 DIAGNOSIS — E663 Overweight: Secondary | ICD-10-CM

## 2023-03-24 DIAGNOSIS — Z8673 Personal history of transient ischemic attack (TIA), and cerebral infarction without residual deficits: Secondary | ICD-10-CM

## 2023-03-24 DIAGNOSIS — Z91199 Patient's noncompliance with other medical treatment and regimen due to unspecified reason: Secondary | ICD-10-CM

## 2023-03-24 DIAGNOSIS — E1169 Type 2 diabetes mellitus with other specified complication: Secondary | ICD-10-CM

## 2023-03-24 LAB — CMP14+EGFR
ALT: 24 IU/L (ref 0–32)
AST: 22 IU/L (ref 0–40)
Albumin/Globulin Ratio: 1.3 (ref 1.2–2.2)
Albumin: 4.3 g/dL (ref 3.8–4.8)
Alkaline Phosphatase: 90 IU/L (ref 44–121)
BUN/Creatinine Ratio: 18 (ref 12–28)
BUN: 25 mg/dL (ref 8–27)
Bilirubin Total: 0.3 mg/dL (ref 0.0–1.2)
CO2: 24 mmol/L (ref 20–29)
Calcium: 10.3 mg/dL (ref 8.7–10.3)
Chloride: 103 mmol/L (ref 96–106)
Creatinine, Ser: 1.41 mg/dL — ABNORMAL HIGH (ref 0.57–1.00)
Globulin, Total: 3.2 g/dL (ref 1.5–4.5)
Glucose: 149 mg/dL — ABNORMAL HIGH (ref 70–99)
Potassium: 5.1 mmol/L (ref 3.5–5.2)
Sodium: 142 mmol/L (ref 134–144)
Total Protein: 7.5 g/dL (ref 6.0–8.5)
eGFR: 39 mL/min/{1.73_m2} — ABNORMAL LOW (ref >59)

## 2023-03-24 LAB — BAYER DCA HB A1C WAIVED: HB A1C (BAYER DCA - WAIVED): 10 % — ABNORMAL HIGH (ref 4.8–5.6)

## 2023-03-24 NOTE — Patient Instructions (Signed)

## 2023-03-24 NOTE — Progress Notes (Signed)
Subjective:    Patient ID: Renee [name], female    DOB: Mar 27, 1951, 72 y.o.   MRN: [id number]  No chief complaint on file.  Pt presents to the office today for follow up.   Her DM is uncontrolled and is followed by Podiatry every 8 weeks. She has seen Endocrinologists, but states she has not seen them in awhile because of transportation. Her last A1C was >10.4.   She refuses any medication changes because she is scared of side effects. She has stopped her Januvia because it was "not working".    She has diabetic neuropathy and takes gabapentin 400 mg TID. States her pain is an aching, burning  pain of 7 out 10 with medications.    Has hx of CVA.  Hypertension This is a chronic problem. The current episode started more than 1 year ago. The problem has been resolved since onset. The problem is controlled. Pertinent negatives include no malaise/fatigue, peripheral edema or shortness of breath. Risk factors for coronary artery disease include dyslipidemia, diabetes mellitus, obesity and sedentary lifestyle. The current treatment provides moderate improvement.  Gastroesophageal Reflux She complains of belching and heartburn. This is a chronic problem. The current episode started more than 1 year ago. The problem occurs occasionally. She has tried a PPI for the symptoms. The treatment provided moderate relief.  Hyperlipidemia This is a chronic problem. The current episode started more than 1 year ago. The problem is controlled. Recent lipid tests were reviewed and are normal. Pertinent negatives include no shortness of breath. Current antihyperlipidemic treatment includes statins. The current treatment provides moderate improvement of lipids. Risk factors for coronary artery disease include dyslipidemia, diabetes mellitus, hypertension and a sedentary lifestyle.  Diabetes She presents for her follow-up diabetic visit. She has type 2 diabetes mellitus. Associated symptoms include foot  paresthesias. Symptoms are stable. Diabetic complications include peripheral neuropathy. Risk factors for coronary artery disease include dyslipidemia, diabetes mellitus, hypertension and sedentary lifestyle. She is following a generally unhealthy diet. Her overall blood glucose range is 130-140 mg/dl.      Review of Systems  Constitutional:  Negative for malaise/fatigue.  Respiratory:  Negative for shortness of breath.   Gastrointestinal:  Positive for heartburn.  All other systems reviewed and are negative.      Objective:   Physical Exam Vitals reviewed.  Constitutional:      General: She is not in acute distress.    Appearance: She is well-developed. She is obese.  HENT:     Head: Normocephalic and atraumatic.     Right Ear: Tympanic membrane normal.     Left Ear: Tympanic membrane normal.  Eyes:     Pupils: Pupils are equal, round, and reactive to light.  Neck:     Thyroid: No thyromegaly.  Cardiovascular:     Rate and Rhythm: Normal rate and regular rhythm.     Heart sounds: Normal heart sounds. No murmur heard. Pulmonary:     Effort: Pulmonary effort is normal. No respiratory distress.     Breath sounds: Normal breath sounds. No wheezing.  Abdominal:     General: Bowel sounds are normal. There is no distension.     Palpations: Abdomen is soft.     Tenderness: There is no abdominal tenderness.  Musculoskeletal:        General: No tenderness. Normal range of motion.     Cervical back: Normal range of motion and neck supple.  Skin:    General: Skin is warm and dry.  Neurological:     Mental Status: She is alert and oriented to person, place, and time.     Cranial Nerves: No cranial nerve deficit.     Deep Tendon Reflexes: Reflexes are normal and symmetric.  Psychiatric:        Behavior: Behavior normal.        Thought Content: Thought content normal.        Judgment: Judgment normal.      BP 125/72   Pulse 78   Temp (!) 97.4 F (36.3 C) (Temporal)   Ht 5'  6" (1.676 m)   Wt 163 lb 3.2 oz (74 kg)   SpO2 99%   BMI 26.34 kg/m       Assessment & Plan:  Renee [name] comes in today with chief complaint of Medical Management of Chronic Issues   Diagnosis and orders addressed:  1. Gastroesophageal reflux disease with esophagitis, unspecified whether hemorrhage - CMP14+EGFR  2. Non-compliance - CMP14+EGFR  3. Essential hypertension, benign - CMP14+EGFR  4. Constipation, unspecified constipation type - CMP14+EGFR  5. Overweight (BMI 25.0-29.9) - CMP14+EGFR  6. Type 2 diabetes mellitus with diabetic polyneuropathy, without long-term current use of insulin (HCC) - Bayer DCA Hb A1c Waived - CMP14+EGFR  7. Diabetic peripheral neuropathy (HCC) - CMP14+EGFR  8. History of CVA (cerebrovascular accident) - CMP14+EGFR  9. Hypertension associated with diabetes ([place]) - CMP14+EGFR  10. Hyperlipidemia associated with type 2 diabetes mellitus ([place]) - CMP14+EGFR   Labs pending Health Maintenance reviewed Diet and exercise encouraged  Follow up plan: 3 months    Evelina Dun, FNP

## 2023-03-29 ENCOUNTER — Other Ambulatory Visit: Payer: Self-pay | Admitting: Family

## 2023-03-29 DIAGNOSIS — I152 Hypertension secondary to endocrine disorders: Secondary | ICD-10-CM

## 2023-04-19 ENCOUNTER — Encounter: Payer: Self-pay | Admitting: *Deleted

## 2023-04-25 ENCOUNTER — Ambulatory Visit (INDEPENDENT_AMBULATORY_CARE_PROVIDER_SITE_OTHER): Payer: Medicare Other | Admitting: *Deleted

## 2023-04-25 DIAGNOSIS — I152 Hypertension secondary to endocrine disorders: Secondary | ICD-10-CM

## 2023-04-25 DIAGNOSIS — E1142 Type 2 diabetes mellitus with diabetic polyneuropathy: Secondary | ICD-10-CM

## 2023-04-25 NOTE — Chronic Care Management (AMB) (Signed)
Chronic Care Management   CCM RN Visit Note  04/25/2023 Name: Renee [name] MRN: [id number] DOB: [birth year]  Subjective: Renee [name] is a 72 y.o. year old female who is a primary care patient of Junie Spencer, FNP. The patient was referred to the Chronic Care Management team for assistance with care management needs subsequent to provider initiation of CCM services and plan of care.    Today's Visit:  Engaged with patient by telephone for follow up visit.        Goals Addressed             This Visit's Progress    CCM (DIABETES) EXPECTED OUTCOME: MONITOR, SELF-MANAGE AND REDUCE SYMPTOMS OF DIABETES       Current Barriers:  Knowledge Deficits related to management of Diabetes Chronic Disease Management support and education needs related to Diabetes management, diet Patient reports she checks CBG once daily fasting with ranges 100-200's range with today's reading 224, no readings over 240 , pt verbalizes understanding that AIC is 10.0 on 03/24/23 and working towards goal of <7 Patient states she tries to follow a special diet, drinks tea with splenda, sugar free diet soda Patient reports she continues trying to walk when she can  Planned Interventions: Reviewed medications with patient and discussed importance of medication adherence;        Reviewed prescribed diet with patient carbohydrate modified, gave examples of food choices that will elevate blood sugar; Counseled on importance of regular laboratory monitoring as prescribed;        Advised patient, providing education and rationale, to check cbg once daily and record        call provider for findings outside established parameters;       Review of patient status, including review of consultants reports, relevant laboratory and other test results, and medications completed;       Advised patient to discuss issues with blood sugar, medications with provider;      Reviewed importance of daily consistent exercise,  continue walking Reviewed examples of foods high in carbohydrates/ starchy foods that will elevate blood sugar  Symptom Management: Take medications as prescribed   Attend all scheduled provider appointments Call pharmacy for medication refills 3-7 days in advance of running out of medications Attend church or other social activities Perform all self care activities independently  Perform IADL's (shopping, preparing meals, housekeeping, managing finances) independently Call provider office for new concerns or questions  check blood sugar at prescribed times: once daily check feet daily for cuts, sores or redness enter blood sugar readings and medication or insulin into daily log take the blood sugar log to all doctor visits take the blood sugar meter to all doctor visits trim toenails straight across drink 6 to 8 glasses of water each day eat fish at least once per week fill half of plate with vegetables limit fast food meals to no more than 1 per week manage portion size prepare main meal at home 3 to 5 days each week read food labels for fat, fiber, carbohydrates and portion size keep feet up while sitting wash and dry feet carefully every day Too much potatoes, rice, pasta, bread at one meal with cause your blood sugar to go up Avoid concentrated sweets Choose water to drink as much as possible Continue walking- keep up the good work!  Follow Up Plan: Telephone follow up appointment with care management team member scheduled for:  07/08/23 at 9 am  CCM (HYPERTENSION)  EXPECTED OUTCOME:  MONITOR,SELF- MANAGE AND REDUCE SYMPTOMS OF HYPERTENSION       Current Barriers:  Knowledge Deficits related to Hypertension Care Coordination needs related to obtaining a blood pressure cuff in a patient with hypertension Chronic Disease Management support and education needs related to importance of monitoring blood pressure, adherence to low sodium diet and exercise Patient lives with  her sister, is independent with ADL, IADL's, uses county van for transportation, patient states she obtained blood pressure cuff from [employer], states she has is checking blood pressure few times per week with today's reading 125/67. Patient reports she continues to be mindful of sodium intake  Planned Interventions: Evaluation of current treatment plan related to hypertension self management and patient's adherence to plan as established by provider;   Reviewed prescribed diet low sodium  Reviewed medications with patient and discussed importance of compliance;  Counseled on the importance of exercise goals with target of 150 minutes per week Advised patient, providing education and rationale, to monitor blood pressure daily and record, calling PCP for findings outside established parameters;  Advised patient to discuss any issues with blood pressure with provider; Discussed complications of poorly controlled blood pressure such as heart disease, stroke, circulatory complications, vision complications, kidney impairment, sexual dysfunction;   Symptom Management: Take medications as prescribed   Attend all scheduled provider appointments Call pharmacy for medication refills 3-7 days in advance of running out of medications Attend church or other social activities Perform all self care activities independently  Perform IADL's (shopping, preparing meals, housekeeping, managing finances) independently Call provider office for new concerns or questions  check blood pressure weekly choose a place to take my blood pressure (home, clinic or office, retail store) write blood pressure results in a log or diary learn about high blood pressure keep a blood pressure log take blood pressure log to all doctor appointments call doctor for signs and symptoms of high blood pressure develop an action plan for high blood pressure take medications for blood pressure exactly as  prescribed report new symptoms to your doctor eat more whole grains, fruits and vegetables, lean meats and healthy fats Continue to check your blood pressure, keep a log, take the log to the doctor with you Continue to follow a low sodium diet Avoid salty snacks, fast food Read food labels for sodium content  Follow Up Plan: Telephone follow up appointment with care management team member scheduled for:   07/08/23 at 9 am       COMPLETED: CCM (KIDNEY FAILURE)  EXPECTED OUTCOME:  MONITOR, SELF-MANAGE AND REDUCE SYMPTOMS OF KIDNEY FAILURE       Current Barriers:  Knowledge Deficits related to Kidney Failure/ CKD Chronic Disease Management support and education needs related to Kidney Failure/ CKD Patient reports she has all medications and taking as prescribed  Planned Interventions: Reviewed importance of taking medications as prescribed Reinforced low sodium diet, foods high in sodium to limit/ avoid Reviewed importance of keeping blood pressure and blood sugar within normal limits Reviewed impact that hyperglycemia and hypertension have on the kidneys/ body Reinforced healthy ways of preparing food-baking and broiling instead of frying  Symptom Management: Take medications as prescribed   Attend all scheduled provider appointments Call pharmacy for medication refills 3-7 days in advance of running out of medications Attend church or other social activities Perform all self care activities independently  Perform IADL's (shopping, preparing meals, housekeeping, managing finances) independently Call provider office for new  concerns or questions  Drink plenty of fluids- preferably water Continue to walk as much as you are able Get outside daily, weather permitting Keep blood sugar and blood pressure within normal limits Choose fresh and frozen foods when possible Bake/broil instead of frying  Follow Up Plan: Telephone follow up appointment with care management team member scheduled  for: 07/08/23 at 9 am             Plan:Telephone follow up appointment with care management team member scheduled for:  07/08/23 at 9 am  Irving Shows [facility], BSN RN Case Manager [facility] [phone]

## 2023-04-25 NOTE — Patient Instructions (Signed)
Please call the care guide team at [phone] if you need to cancel or reschedule your appointment.   If you are experiencing a Mental Health or Behavioral Health Crisis or need someone to talk to, please call the Suicide and Crisis Lifeline: 988 call the [place] National Suicide Prevention Lifeline: 952-432-5006 or TTY: (585)484-0106 TTY (505)681-2240) to talk to a trained counselor call 1-800-273-TALK (toll free, 24 hour hotline) go to [facility] [address] [place] [phone]) call the [facility]: [phone] call 911   Following is a copy of the CCM Program Consent:  CCM service includes personalized support from designated clinical staff supervised by the physician, including individualized plan of care and coordination with other care providers 24/7 contact phone numbers for assistance for urgent and routine care needs. Service will only be billed when office clinical staff spend 20 minutes or more in a month to coordinate care. Only one practitioner may furnish and bill the service in a calendar month. The patient may stop CCM services at amy time (effective at the end of the month) by phone call to the office staff. The patient will be responsible for cost sharing (co-pay) or up to 20% of the service fee (after annual deductible is met)  Following is a copy of your full provider care plan:   Goals Addressed             This Visit's Progress    CCM (DIABETES) EXPECTED OUTCOME: MONITOR, SELF-MANAGE AND REDUCE SYMPTOMS OF DIABETES       Current Barriers:  Knowledge Deficits related to management of Diabetes Chronic Disease Management support and education needs related to Diabetes management, diet Patient reports she checks CBG once daily fasting with ranges 100-200's range with today's reading 224, no readings over 240 , pt verbalizes understanding that AIC is 10.0 on 03/24/23 and working towards goal of <7 Patient  states she tries to follow a special diet, drinks tea with splenda, sugar free diet soda Patient reports she continues trying to walk when she can  Planned Interventions: Reviewed medications with patient and discussed importance of medication adherence;        Reviewed prescribed diet with patient carbohydrate modified, gave examples of food choices that will elevate blood sugar; Counseled on importance of regular laboratory monitoring as prescribed;        Advised patient, providing education and rationale, to check cbg once daily and record        call provider for findings outside established parameters;       Review of patient status, including review of consultants reports, relevant laboratory and other test results, and medications completed;       Advised patient to discuss issues with blood sugar, medications with provider;      Reviewed importance of daily consistent exercise, continue walking Reviewed examples of foods high in carbohydrates/ starchy foods that will elevate blood sugar  Symptom Management: Take medications as prescribed   Attend all scheduled provider appointments Call pharmacy for medication refills 3-7 days in advance of running out of medications Attend church or other social activities Perform all self care activities independently  Perform IADL's (shopping, preparing meals, housekeeping, managing finances) independently Call provider office for new concerns or questions  check blood sugar at prescribed times: once daily check feet daily for cuts, sores or redness enter blood sugar readings and medication or insulin into daily log take the blood sugar log to all doctor visits take the blood  sugar meter to all doctor visits trim toenails straight across drink 6 to 8 glasses of water each day eat fish at least once per week fill half of plate with vegetables limit fast food meals to no more than 1 per week manage portion size prepare main meal at home 3  to 5 days each week read food labels for fat, fiber, carbohydrates and portion size keep feet up while sitting wash and dry feet carefully every day Too much potatoes, rice, pasta, bread at one meal with cause your blood sugar to go up Avoid concentrated sweets Choose water to drink as much as possible Continue walking- keep up the good work!  Follow Up Plan: Telephone follow up appointment with care management team member scheduled for:  07/08/23 at 9 am       CCM (HYPERTENSION)  EXPECTED OUTCOME:  MONITOR,SELF- MANAGE AND REDUCE SYMPTOMS OF HYPERTENSION       Current Barriers:  Knowledge Deficits related to Hypertension Care Coordination needs related to obtaining a blood pressure cuff in a patient with hypertension Chronic Disease Management support and education needs related to importance of monitoring blood pressure, adherence to low sodium diet and exercise Patient lives with her sister, is independent with ADL, IADL's, uses [place] for transportation, patient states she obtained blood pressure cuff from [employer], states she has is checking blood pressure few times per week with today's reading 125/67. Patient reports she continues to be mindful of sodium intake  Planned Interventions: Evaluation of current treatment plan related to hypertension self management and patient's adherence to plan as established by provider;   Reviewed prescribed diet low sodium  Reviewed medications with patient and discussed importance of compliance;  Counseled on the importance of exercise goals with target of 150 minutes per week Advised patient, providing education and rationale, to monitor blood pressure daily and record, calling PCP for findings outside established parameters;  Advised patient to discuss any issues with blood pressure with provider; Discussed complications of poorly controlled blood pressure such as heart disease, stroke, circulatory complications,  vision complications, kidney impairment, sexual dysfunction;   Symptom Management: Take medications as prescribed   Attend all scheduled provider appointments Call pharmacy for medication refills 3-7 days in advance of running out of medications Attend church or other social activities Perform all self care activities independently  Perform IADL's (shopping, preparing meals, housekeeping, managing finances) independently Call provider office for new concerns or questions  check blood pressure weekly choose a place to take my blood pressure (home, clinic or office, retail store) write blood pressure results in a log or diary learn about high blood pressure keep a blood pressure log take blood pressure log to all doctor appointments call doctor for signs and symptoms of high blood pressure develop an action plan for high blood pressure take medications for blood pressure exactly as prescribed report new symptoms to your doctor eat more whole grains, fruits and vegetables, lean meats and healthy fats Continue to check your blood pressure, keep a log, take the log to the doctor with you Continue to follow a low sodium diet Avoid salty snacks, fast food Read food labels for sodium content  Follow Up Plan: Telephone follow up appointment with care management team member scheduled for:   07/08/23 at 9 am       COMPLETED: CCM (KIDNEY FAILURE)  EXPECTED OUTCOME:  MONITOR, SELF-MANAGE AND REDUCE SYMPTOMS OF KIDNEY FAILURE       Current Barriers:  Knowledge Deficits related to Kidney Failure/ CKD Chronic Disease Management support and education needs related to Kidney Failure/ CKD Patient reports she has all medications and taking as prescribed  Planned Interventions: Reviewed importance of taking medications as prescribed Reinforced low sodium diet, foods high in sodium to limit/ avoid Reviewed importance of keeping blood pressure and blood sugar within normal limits Reviewed impact that  hyperglycemia and hypertension have on the kidneys/ body Reinforced healthy ways of preparing food-baking and broiling instead of frying  Symptom Management: Take medications as prescribed   Attend all scheduled provider appointments Call pharmacy for medication refills 3-7 days in advance of running out of medications Attend church or other social activities Perform all self care activities independently  Perform IADL's (shopping, preparing meals, housekeeping, managing finances) independently Call provider office for new concerns or questions  Drink plenty of fluids- preferably water Continue to walk as much as you are able Get outside daily, weather permitting Keep blood sugar and blood pressure within normal limits Choose fresh and frozen foods when possible Bake/broil instead of frying  Follow Up Plan: Telephone follow up appointment with care management team member scheduled for: 07/08/23 at 9 am             Patient verbalizes understanding of instructions and care plan provided today and agrees to view in MyChart. Active MyChart status and patient understanding of how to access instructions and care plan via MyChart confirmed with patient.  Telephone follow up appointment with care management team member scheduled for:  07/08/23 at 9 am

## 2023-04-26 DIAGNOSIS — E1159 Type 2 diabetes mellitus with other circulatory complications: Secondary | ICD-10-CM

## 2023-04-26 DIAGNOSIS — I10 Essential (primary) hypertension: Secondary | ICD-10-CM

## 2023-04-26 DIAGNOSIS — Z7984 Long term (current) use of oral hypoglycemic drugs: Secondary | ICD-10-CM | POA: Diagnosis not present

## 2023-04-27 ENCOUNTER — Telehealth: Payer: Self-pay | Admitting: Family

## 2023-04-27 DIAGNOSIS — E1142 Type 2 diabetes mellitus with diabetic polyneuropathy: Secondary | ICD-10-CM

## 2023-04-27 NOTE — Telephone Encounter (Signed)
Pt called stating that she has tried several times to call the patient assistance company that she uses to get her Marcelline Deist medication because she only 4 pills left, but says she can't get anyone to answer her call and spends hours on hold. Needs advise on what to do.

## 2023-04-27 NOTE — Telephone Encounter (Signed)
Pt called stating that she received a call from the patient assistance company and was told that we needed to send them a new Rx for the [place] before they could fill it and ship it.   Please advise.

## 2023-04-27 NOTE — Telephone Encounter (Signed)
Left pt message regarding [facility] follow up.  Gave patient company phone number to follow up on shipment, although it should fill & ship automatically. Also informed to make sure she is using the correct prompts.  [facility] [phone]

## 2023-04-28 MED ORDER — DAPAGLIFLOZIN PROPANEDIOL 10 MG PO TABS: 10.0000 mg | ORAL_TABLET | Freq: Every day | ORAL | 4 refills | Status: DC

## 2023-04-28 NOTE — Telephone Encounter (Signed)
Refills sent to [facility] for AZ[facility]me PAP

## 2023-04-28 NOTE — Addendum Note (Signed)
Addended by: Vanice Sarah D on: 04/28/2023 01:16 PM   Modules accepted: Orders

## 2023-05-24 DIAGNOSIS — M79676 Pain in unspecified toe(s): Secondary | ICD-10-CM | POA: Diagnosis not present

## 2023-05-24 DIAGNOSIS — E1151 Type 2 diabetes mellitus with diabetic peripheral angiopathy without gangrene: Secondary | ICD-10-CM | POA: Diagnosis not present

## 2023-05-24 DIAGNOSIS — B351 Tinea unguium: Secondary | ICD-10-CM | POA: Diagnosis not present

## 2023-05-24 DIAGNOSIS — L84 Corns and callosities: Secondary | ICD-10-CM | POA: Diagnosis not present

## 2023-06-14 NOTE — Patient Instructions (Addendum)
Our records indicate that you are due for your screening mammogram.  Please call the imaging center that does your yearly mammograms to make an appointment for a mammogram at your earliest convenience. Our office also has a mobile unit through the [facility] that comes to our location. Please call our office if you would like to make an appointment.   Diabetes Mellitus and Nutrition, Adult When you have diabetes, or diabetes mellitus, it is very important to have healthy eating habits because your blood sugar (glucose) levels are greatly affected by what you eat and drink. Eating healthy foods in the right amounts, at about the same times every day, can help you: Manage your blood glucose. Lower your risk of heart disease. Improve your blood pressure. Reach or maintain a healthy weight. What can affect my meal plan? Every person with diabetes is different, and each person has different needs for a meal plan. Your health care provider may recommend that you work with a dietitian to make a meal plan that is best for you. Your meal plan may vary depending on factors such as: The calories you need. The medicines you take. Your weight. Your blood glucose, blood pressure, and cholesterol levels. Your activity level. Other health conditions you have, such as heart or kidney disease. How do carbohydrates affect me? Carbohydrates, also called carbs, affect your blood glucose level more than any other type of food. Eating carbs raises the amount of glucose in your blood. It is important to know how many carbs you can safely have in each meal. This is different for every person. Your dietitian can help you calculate how many carbs you should have at each meal and for each snack. How does alcohol affect me? Alcohol can cause a decrease in blood glucose (hypoglycemia), especially if you use insulin or take certain diabetes medicines by mouth. Hypoglycemia can be a life-threatening  condition. Symptoms of hypoglycemia, such as sleepiness, dizziness, and confusion, are similar to symptoms of having too much alcohol. Do not drink alcohol if: Your health care provider tells you not to drink. You are pregnant, may be pregnant, or are planning to become pregnant. If you drink alcohol: Limit how much you have to: 0-1 drink a day for women. 0-2 drinks a day for men. Know how much alcohol is in your drink. In the [place] one drink equals one 12 oz bottle of beer (355 mL), one 5 oz glass of wine (148 mL), or one 1 oz glass of hard liquor (44 mL). Keep yourself hydrated with water, diet soda, or unsweetened iced tea. Keep in mind that regular soda, juice, and other mixers may contain a lot of sugar and must be counted as carbs. What are tips for following this plan?  Reading food labels Start by checking the serving size on the Nutrition Facts label of packaged foods and drinks. The number of calories and the amount of carbs, fats, and other nutrients listed on the label are based on one serving of the item. Many items contain more than one serving per package. Check the total grams (g) of carbs in one serving. Check the number of grams of saturated fats and trans fats in one serving. Choose foods that have a low amount or none of these fats. Check the number of milligrams (mg) of salt (sodium) in one serving. Most people should limit total sodium intake to less than 2,300 mg per day. Always check the nutrition information of foods labeled as "  low-fat" or "nonfat." These foods may be higher in added sugar or refined carbs and should be avoided. Talk to your dietitian to identify your daily goals for nutrients listed on the label. Shopping Avoid buying canned, pre-made, or processed foods. These foods tend to be high in fat, sodium, and added sugar. Shop around the outside edge of the grocery store. This is where you will most often find fresh fruits and vegetables, bulk grains, fresh  meats, and fresh dairy products. Cooking Use low-heat cooking methods, such as baking, instead of high-heat cooking methods, such as deep frying. Cook using healthy oils, such as olive, canola, or sunflower oil. Avoid cooking with butter, cream, or high-fat meats. Meal planning Eat meals and snacks regularly, preferably at the same times every day. Avoid going long periods of time without eating. Eat foods that are high in fiber, such as fresh fruits, vegetables, beans, and whole grains. Eat 4-6 oz (112-168 g) of lean protein each day, such as lean meat, chicken, fish, eggs, or tofu. One ounce (oz) (28 g) of lean protein is equal to: 1 oz (28 g) of meat, chicken, or fish. 1 egg.  cup (62 g) of tofu. Eat some foods each day that contain healthy fats, such as avocado, nuts, seeds, and fish. What foods should I eat? Fruits Berries. Apples. Oranges. Peaches. Apricots. Plums. Grapes. Mangoes. Papayas. Pomegranates. Kiwi. Cherries. Vegetables Leafy greens, including lettuce, spinach, kale, chard, collard greens, mustard greens, and cabbage. Beets. Cauliflower. Broccoli. Carrots. Green beans. Tomatoes. Peppers. Onions. Cucumbers. Brussels sprouts. Grains Whole grains, such as whole-wheat or whole-grain bread, crackers, tortillas, cereal, and pasta. Unsweetened oatmeal. Quinoa. Brown or wild rice. Meats and other proteins Seafood. Poultry without skin. Lean cuts of poultry and beef. Tofu. Nuts. Seeds. Dairy Low-fat or fat-free dairy products such as milk, yogurt, and cheese. The items listed above may not be a complete list of foods and beverages you can eat and drink. Contact a dietitian for more information. What foods should I avoid? Fruits Fruits canned with syrup. Vegetables Canned vegetables. Frozen vegetables with butter or cream sauce. Grains Refined white flour and flour products such as bread, pasta, snack foods, and cereals. Avoid all processed foods. Meats and other  proteins Fatty cuts of meat. Poultry with skin. Breaded or fried meats. Processed meat. Avoid saturated fats. Dairy Full-fat yogurt, cheese, or milk. Beverages Sweetened drinks, such as soda or iced tea. The items listed above may not be a complete list of foods and beverages you should avoid. Contact a dietitian for more information. Questions to ask a health care provider Do I need to meet with a certified diabetes care and education specialist? Do I need to meet with a dietitian? What number can I call if I have questions? When are the best times to check my blood glucose? Where to find more information: American Diabetes Association: diabetes.org Academy of Nutrition and Dietetics: eatright.org National Institute of Diabetes and Digestive and Kidney Diseases: niddk.nih.gov Association of Diabetes Care & Education Specialists: diabeteseducator.org Summary It is important to have healthy eating habits because your blood sugar (glucose) levels are greatly affected by what you eat and drink. It is important to use alcohol carefully. A healthy meal plan will help you manage your blood glucose and lower your risk of heart disease. Your health care provider may recommend that you work with a dietitian to make a meal plan that is best for you. This information is not intended to replace advice given to you by   your health care provider. Make sure you discuss any questions you have with your health care provider. Document Revised: 07/16/2020 Document Reviewed: 07/16/2020 Elsevier Patient Education  2024 Elsevier Inc.   

## 2023-06-15 LAB — HM DIABETES EYE EXAM

## 2023-06-24 ENCOUNTER — Other Ambulatory Visit: Payer: Self-pay | Admitting: Family

## 2023-06-24 ENCOUNTER — Ambulatory Visit: Payer: Medicare Other | Admitting: Family

## 2023-06-26 ENCOUNTER — Other Ambulatory Visit: Payer: Self-pay | Admitting: Family

## 2023-06-26 DIAGNOSIS — I152 Hypertension secondary to endocrine disorders: Secondary | ICD-10-CM

## 2023-06-26 DIAGNOSIS — K2100 Gastro-esophageal reflux disease with esophagitis, without bleeding: Secondary | ICD-10-CM

## 2023-06-26 DIAGNOSIS — E1142 Type 2 diabetes mellitus with diabetic polyneuropathy: Secondary | ICD-10-CM

## 2023-06-27 ENCOUNTER — Ambulatory Visit (INDEPENDENT_AMBULATORY_CARE_PROVIDER_SITE_OTHER): Payer: Medicare Other | Admitting: Family

## 2023-06-27 ENCOUNTER — Encounter: Payer: Self-pay | Admitting: Family

## 2023-06-27 ENCOUNTER — Other Ambulatory Visit: Payer: Self-pay | Admitting: Family

## 2023-06-27 VITALS — BP 106/65 | HR 71 | Temp 97.0°F | Ht 66.0 in | Wt 160.4 lb

## 2023-06-27 DIAGNOSIS — E1169 Type 2 diabetes mellitus with other specified complication: Secondary | ICD-10-CM

## 2023-06-27 DIAGNOSIS — K5900 Constipation, unspecified: Secondary | ICD-10-CM

## 2023-06-27 DIAGNOSIS — E1159 Type 2 diabetes mellitus with other circulatory complications: Secondary | ICD-10-CM | POA: Diagnosis not present

## 2023-06-27 DIAGNOSIS — Z6825 Body mass index (BMI) 25.0-25.9, adult: Secondary | ICD-10-CM

## 2023-06-27 DIAGNOSIS — Z91199 Patient's noncompliance with other medical treatment and regimen due to unspecified reason: Secondary | ICD-10-CM | POA: Diagnosis not present

## 2023-06-27 DIAGNOSIS — I152 Hypertension secondary to endocrine disorders: Secondary | ICD-10-CM

## 2023-06-27 DIAGNOSIS — E1142 Type 2 diabetes mellitus with diabetic polyneuropathy: Secondary | ICD-10-CM | POA: Diagnosis not present

## 2023-06-27 DIAGNOSIS — K2100 Gastro-esophageal reflux disease with esophagitis, without bleeding: Secondary | ICD-10-CM

## 2023-06-27 DIAGNOSIS — E785 Hyperlipidemia, unspecified: Secondary | ICD-10-CM | POA: Diagnosis not present

## 2023-06-27 DIAGNOSIS — Z8673 Personal history of transient ischemic attack (TIA), and cerebral infarction without residual deficits: Secondary | ICD-10-CM

## 2023-06-27 DIAGNOSIS — Z1231 Encounter for screening mammogram for malignant neoplasm of breast: Secondary | ICD-10-CM

## 2023-06-27 DIAGNOSIS — E663 Overweight: Secondary | ICD-10-CM

## 2023-06-27 DIAGNOSIS — I10 Essential (primary) hypertension: Secondary | ICD-10-CM

## 2023-06-27 LAB — CMP14+EGFR
ALT: 22 IU/L (ref 0–32)
AST: 26 IU/L (ref 0–40)
Albumin: 4 g/dL (ref 3.8–4.8)
Alkaline Phosphatase: 101 IU/L (ref 44–121)
BUN/Creatinine Ratio: 19 (ref 12–28)
BUN: 24 mg/dL (ref 8–27)
Bilirubin Total: 0.3 mg/dL (ref 0.0–1.2)
CO2: 22 mmol/L (ref 20–29)
Calcium: 9.8 mg/dL (ref 8.7–10.3)
Chloride: 103 mmol/L (ref 96–106)
Creatinine, Ser: 1.28 mg/dL — ABNORMAL HIGH (ref 0.57–1.00)
Globulin, Total: 3 g/dL (ref 1.5–4.5)
Glucose: 232 mg/dL — ABNORMAL HIGH (ref 70–99)
Potassium: 4.6 mmol/L (ref 3.5–5.2)
Sodium: 139 mmol/L (ref 134–144)
Total Protein: 7 g/dL (ref 6.0–8.5)
eGFR: 44 mL/min/{1.73_m2} — ABNORMAL LOW (ref >59)

## 2023-06-27 LAB — BAYER DCA HB A1C WAIVED: HB A1C (BAYER DCA - WAIVED): 11.3 % — ABNORMAL HIGH (ref 4.8–5.6)

## 2023-06-27 MED ORDER — DAPAGLIFLOZIN PROPANEDIOL 10 MG PO TABS: 10.0000 mg | ORAL_TABLET | Freq: Every day | ORAL | 4 refills | Status: DC

## 2023-06-27 MED ORDER — GABAPENTIN 400 MG PO CAPS: 400.0000 mg | ORAL_CAPSULE | Freq: Three times a day (TID) | ORAL | 0 refills | Status: DC

## 2023-06-27 MED ORDER — VALSARTAN-HYDROCHLOROTHIAZIDE 160-25 MG PO TABS: 1.0000 | ORAL_TABLET | Freq: Every day | ORAL | 0 refills | Status: DC

## 2023-06-27 MED ORDER — ATORVASTATIN CALCIUM 40 MG PO TABS: 40.0000 mg | ORAL_TABLET | Freq: Every day | ORAL | 3 refills | Status: DC

## 2023-06-27 MED ORDER — GLIPIZIDE 10 MG PO TABS
10.0000 mg | ORAL_TABLET | Freq: Two times a day (BID) | ORAL | 0 refills | Status: DC
Start: 2023-06-27 — End: 2023-11-07

## 2023-06-27 MED ORDER — OMEPRAZOLE 20 MG PO CPDR: 20.0000 mg | DELAYED_RELEASE_CAPSULE | Freq: Every day | ORAL | 0 refills | Status: DC

## 2023-06-27 NOTE — Progress Notes (Signed)
Subjective:    Patient ID: Renee [name], female    DOB: [birth year], 72 y.o.   MRN: [id number]  Chief Complaint  Patient presents with   Medical Management of Chronic Issues    Pt presents to the office today for follow up.    Her DM is uncontrolled and is followed by Podiatry every 8 weeks. She has seen Endocrinologists, but states she has not seen them in awhile because of transportation. Her last A1C was 10.   She refuses any medication changes because she is scared of side effects. She has stopped her Januvia because it was "not working".    She has diabetic neuropathy and takes gabapentin 400 mg TID. States her pain is an aching, burning  pain of 7 out 10 with medications.    Has hx of CVA.  Diabetes She presents for her follow-up diabetic visit. She has type 2 diabetes mellitus. Associated symptoms include foot paresthesias. Pertinent negatives for diabetes include no blurred vision. Symptoms are stable. Diabetic complications include peripheral neuropathy. Risk factors for coronary artery disease include dyslipidemia, diabetes mellitus, hypertension and sedentary lifestyle. She is following a generally unhealthy diet. Her overall blood glucose range is 140-180 mg/dl.  Hypertension This is a chronic problem. The current episode started more than 1 year ago. The problem has been resolved since onset. The problem is controlled. Pertinent negatives include no blurred vision, malaise/fatigue, peripheral edema or shortness of breath. Risk factors for coronary artery disease include diabetes mellitus, dyslipidemia and sedentary lifestyle. The current treatment provides moderate improvement.  Gastroesophageal Reflux She complains of belching and heartburn. She reports no globus sensation. This is a chronic problem. The current episode started more than 1 year ago. The problem occurs occasionally. She has tried a PPI for the symptoms. The treatment provided moderate relief.   Hyperlipidemia This is a chronic problem. The current episode started more than 1 year ago. The problem is controlled. Recent lipid tests were reviewed and are normal. Exacerbating diseases include obesity. Pertinent negatives include no shortness of breath. Current antihyperlipidemic treatment includes statins. The current treatment provides moderate improvement of lipids. Risk factors for coronary artery disease include diabetes mellitus, dyslipidemia and hypertension.  Constipation This is a chronic problem. The current episode started more than 1 year ago. The problem has been resolved since onset. The treatment provided moderate relief.      Review of Systems  Constitutional:  Negative for malaise/fatigue.  Eyes:  Negative for blurred vision.  Respiratory:  Negative for shortness of breath.   Gastrointestinal:  Positive for constipation and heartburn.  All other systems reviewed and are negative.      Objective:   Physical Exam Vitals reviewed.  Constitutional:      General: She is not in acute distress.    Appearance: Normal appearance. She is well-developed.  HENT:     Head: Normocephalic and atraumatic.     Right Ear: Tympanic membrane normal.     Left Ear: Tympanic membrane normal.  Eyes:     Pupils: Pupils are equal, round, and reactive to light.  Neck:     Thyroid: No thyromegaly.  Cardiovascular:     Rate and Rhythm: Normal rate and regular rhythm.     Heart sounds: Normal heart sounds. No murmur heard. Pulmonary:     Effort: Pulmonary effort is normal. No respiratory distress.     Breath sounds: Normal breath sounds. No wheezing.  Abdominal:     General: Bowel sounds are normal. There  is no distension.     Palpations: Abdomen is soft.     Tenderness: There is no abdominal tenderness.  Musculoskeletal:        General: No tenderness. Normal range of motion.     Cervical back: Normal range of motion and neck supple.  Skin:    General: Skin is warm and dry.   Neurological:     Mental Status: She is alert and oriented to person, place, and time.     Cranial Nerves: No cranial nerve deficit.     Deep Tendon Reflexes: Reflexes are normal and symmetric.  Psychiatric:        Behavior: Behavior normal.        Thought Content: Thought content normal.        Judgment: Judgment normal.     Diabetic Foot Exam - Simple   Simple Foot Form Diabetic Foot exam was performed with the following findings: Yes 06/27/2023 10:19 AM  Visual Inspection Sensation Testing Intact to touch and monofilament testing bilaterally: Yes Pulse Check Posterior Tibialis and Dorsalis pulse intact bilaterally: Yes Comments Toenails thick      BP 106/65   Pulse 71   Temp (!) 97 F (36.1 C) (Temporal)   Ht 5\' 6"  (1.676 m)   Wt 160 lb 6.4 oz (72.8 kg)   SpO2 100%   BMI 25.89 kg/m      Assessment & Plan:  Renee [name] comes in today with chief complaint of Medical Management of Chronic Issues   Diagnosis and orders addressed:  1. Hyperlipidemia associated with type 2 diabetes mellitus (HCC) - atorvastatin (LIPITOR) 40 MG tablet; Take 1 tablet (40 mg total) by mouth daily.  Dispense: 90 tablet; Refill: 3 - CMP14+EGFR  2. Type 2 diabetes mellitus with diabetic polyneuropathy, without long-term current use of insulin (HCC) - dapagliflozin propanediol (FARXIGA) 10 MG TABS tablet; Take 1 tablet (10 mg total) by mouth daily before breakfast.  Dispense: 90 tablet; Refill: 4 - CMP14+EGFR - Bayer DCA Hb A1c Waived  3. Diabetic peripheral neuropathy (HCC) - gabapentin (NEURONTIN) 400 MG capsule; Take 1 capsule (400 mg total) by mouth 3 (three) times daily.  Dispense: 300 capsule; Refill: 0 - CMP14+EGFR  4. Hypertension associated with diabetes (HCC)  - glipiZIDE (GLUCOTROL) 10 MG tablet; Take 1 tablet (10 mg total) by mouth 2 (two) times daily before a meal.  Dispense: 200 tablet; Refill: 0 - CMP14+EGFR  5. Gastroesophageal reflux disease with esophagitis  without hemorrhage - omeprazole (PRILOSEC) 20 MG capsule; Take 1 capsule (20 mg total) by mouth daily.  Dispense: 100 capsule; Refill: 0 - CMP14+EGFR  6. Essential hypertension, benign - CMP14+EGFR  7. Constipation, unspecified constipation type - CMP14+EGFR  8. Overweight (BMI 25.0-29.9) - CMP14+EGFR  9. Non-compliance - CMP14+EGFR  10. History of CVA (cerebrovascular accident) - CMP14+EGFR   Labs pending She refuses all medication changes at this time. Discussed complications with uncontrolled DM.  Health Maintenance reviewed Diet and exercise encouraged  Follow up plan: 3 months    Jannifer Rodney, FNP

## 2023-07-04 ENCOUNTER — Ambulatory Visit
Admission: RE | Admit: 2023-07-04 | Discharge: 2023-07-04 | Disposition: A | Discharge status: Home / Self Care | Payer: Medicare Other | Source: Ambulatory Visit | Attending: Family | Admitting: Family

## 2023-07-04 DIAGNOSIS — Z1231 Encounter for screening mammogram for malignant neoplasm of breast: Secondary | ICD-10-CM

## 2023-07-08 ENCOUNTER — Ambulatory Visit (INDEPENDENT_AMBULATORY_CARE_PROVIDER_SITE_OTHER): Payer: Medicare Other | Admitting: *Deleted

## 2023-07-08 DIAGNOSIS — E1142 Type 2 diabetes mellitus with diabetic polyneuropathy: Secondary | ICD-10-CM

## 2023-07-08 DIAGNOSIS — E1159 Type 2 diabetes mellitus with other circulatory complications: Secondary | ICD-10-CM

## 2023-07-08 NOTE — Chronic Care Management (AMB) (Signed)
Chronic Care Management   CCM RN Visit Note  07/08/2023 Name: Renee [name] MRN: [id number] DOB: 1951/07/07  Subjective: Renee [name] is a 72 y.o. year old female who is a primary care patient of Junie Spencer, FNP. The patient was referred to the Chronic Care Management team for assistance with care management needs subsequent to provider initiation of CCM services and plan of care.    Today's Visit:  Engaged with patient by telephone for follow up visit.        Goals Addressed             This Visit's Progress    CCM (DIABETES) EXPECTED OUTCOME: MONITOR, SELF-MANAGE AND REDUCE SYMPTOMS OF DIABETES       Current Barriers:  Knowledge Deficits related to management of Diabetes Chronic Disease Management support and education needs related to Diabetes management, diet Patient reports she checks CBG once daily fasting with ranges 100-200's range with today's reading 125, pt verbalizes understanding that recent [facility] is 11 and working towards goal of <7 Patient states she tries to follow a special diet, drinks tea with splenda, sugar free diet soda Patient reports she continues trying to walk when she can  Planned Interventions: Reviewed medications with patient and discussed importance of medication adherence;        Reviewed prescribed diet with patient carbohydrate modified, gave examples of food choices that will elevate blood sugar; Counseled on importance of regular laboratory monitoring as prescribed;        Advised patient, providing education and rationale, to check cbg once daily and record        call provider for findings outside established parameters;       Review of patient status, including review of consultants reports, relevant laboratory and other test results, and medications completed;       Advised patient to discuss issues with blood sugar, medications with provider;      Reinforced importance of daily consistent exercise, continue walking Reinforced  examples of foods high in carbohydrates/ starchy foods that will elevate blood sugar  Symptom Management: Take medications as prescribed   Attend all scheduled provider appointments Call pharmacy for medication refills 3-7 days in advance of running out of medications Attend church or other social activities Perform all self care activities independently  Perform IADL's (shopping, preparing meals, housekeeping, managing finances) independently Call provider office for new concerns or questions  check blood sugar at prescribed times: once daily check feet daily for cuts, sores or redness enter blood sugar readings and medication or insulin into daily log take the blood sugar log to all doctor visits take the blood sugar meter to all doctor visits trim toenails straight across drink 6 to 8 glasses of water each day eat fish at least once per week fill half of plate with vegetables limit fast food meals to no more than 1 per week manage portion size prepare main meal at home 3 to 5 days each week read food labels for fat, fiber, carbohydrates and portion size set a realistic goal wash and dry feet carefully every day Too much potatoes, rice, pasta, bread at one meal with cause your blood sugar to go up Avoid concentrated sweets Choose water to drink as much as possible Continue walking- keep up the good work! Continue working towards goal of [facility] <7  Follow Up Plan: Telephone follow up appointment with care management team member scheduled for:  09/23/23 at 9 am  CCM (HYPERTENSION)  EXPECTED OUTCOME:  MONITOR,SELF- MANAGE AND REDUCE SYMPTOMS OF HYPERTENSION       Current Barriers:  Knowledge Deficits related to Hypertension Care Coordination needs related to obtaining a blood pressure cuff in a patient with hypertension Chronic Disease Management support and education needs related to importance of monitoring blood pressure, adherence to low sodium diet and exercise Patient  lives with her sister, is independent with ADL, IADL's, uses county van for transportation, patient states she continues checking blood pressure few times per week with today's reading 106/65. Patient reports she continues to be mindful of sodium intake  Planned Interventions: Evaluation of current treatment plan related to hypertension self management and patient's adherence to plan as established by provider;   Reviewed medications with patient and discussed importance of compliance;  Counseled on the importance of exercise goals with target of 150 minutes per week Advised patient, providing education and rationale, to monitor blood pressure daily and record, calling PCP for findings outside established parameters;  Advised patient to discuss any issues with blood pressure with provider; Discussed complications of poorly controlled blood pressure such as heart disease, stroke, circulatory complications, vision complications, kidney impairment, sexual dysfunction;  Reinforced carbohydrate modified diet Reviewed upcoming scheduled appointments  Symptom Management: Take medications as prescribed   Attend all scheduled provider appointments Call pharmacy for medication refills 3-7 days in advance of running out of medications Attend church or other social activities Perform all self care activities independently  Perform IADL's (shopping, preparing meals, housekeeping, managing finances) independently Call provider office for new concerns or questions  check blood pressure weekly choose a place to take my blood pressure (home, clinic or office, retail store) write blood pressure results in a log or diary learn about high blood pressure keep a blood pressure log take blood pressure log to all doctor appointments call doctor for signs and symptoms of high blood pressure develop an action plan for high blood pressure keep all doctor appointments take medications for blood pressure exactly as  prescribed report new symptoms to your doctor eat more whole grains, fruits and vegetables, lean meats and healthy fats Continue to check your blood pressure, keep a log, take the log to the doctor with you Continue to follow a low sodium diet Avoid salty snacks, fast food Read food labels for sodium content  Follow Up Plan: Telephone follow up appointment with care management team member scheduled for:   09/23/23 at 9 am          Plan:Telephone follow up appointment with care management team member scheduled for:  09/23/23 at 9 am  Irving Shows [facility], BSN RN Case Manager [facility] [phone]

## 2023-07-08 NOTE — Patient Instructions (Signed)
Please call the care guide team at [phone] if you need to cancel or reschedule your appointment.   If you are experiencing a Mental Health or Behavioral Health Crisis or need someone to talk to, please call the Suicide and Crisis Lifeline: 988 call the [place] National Suicide Prevention Lifeline: (860)046-6940 or TTY: 938-777-3267 TTY 787-554-5950) to talk to a trained counselor call 1-800-273-TALK (toll free, 24 hour hotline) go to [facility] [address] [place] [phone]) call the [facility]: 4247136652 call 911   Following is a copy of the CCM Program Consent:  CCM service includes personalized support from designated clinical staff supervised by the physician, including individualized plan of care and coordination with other care providers 24/7 contact phone numbers for assistance for urgent and routine care needs. Service will only be billed when office clinical staff spend 20 minutes or more in a month to coordinate care. Only one practitioner may furnish and bill the service in a calendar month. The patient may stop CCM services at amy time (effective at the end of the month) by phone call to the office staff. The patient will be responsible for cost sharing (co-pay) or up to 20% of the service fee (after annual deductible is met)  Following is a copy of your full provider care plan:   Goals Addressed             This Visit's Progress    CCM (DIABETES) EXPECTED OUTCOME: MONITOR, SELF-MANAGE AND REDUCE SYMPTOMS OF DIABETES       Current Barriers:  Knowledge Deficits related to management of Diabetes Chronic Disease Management support and education needs related to Diabetes management, diet Patient reports she checks CBG once daily fasting with ranges 100-200's range with today's reading 125, pt verbalizes understanding that recent [facility] is 11 and working towards goal of <7 Patient states she tries to follow a  special diet, drinks tea with splenda, sugar free diet soda Patient reports she continues trying to walk when she can  Planned Interventions: Reviewed medications with patient and discussed importance of medication adherence;        Reviewed prescribed diet with patient carbohydrate modified, gave examples of food choices that will elevate blood sugar; Counseled on importance of regular laboratory monitoring as prescribed;        Advised patient, providing education and rationale, to check cbg once daily and record        call provider for findings outside established parameters;       Review of patient status, including review of consultants reports, relevant laboratory and other test results, and medications completed;       Advised patient to discuss issues with blood sugar, medications with provider;      Reinforced importance of daily consistent exercise, continue walking Reinforced examples of foods high in carbohydrates/ starchy foods that will elevate blood sugar  Symptom Management: Take medications as prescribed   Attend all scheduled provider appointments Call pharmacy for medication refills 3-7 days in advance of running out of medications Attend church or other social activities Perform all self care activities independently  Perform IADL's (shopping, preparing meals, housekeeping, managing finances) independently Call provider office for new concerns or questions  check blood sugar at prescribed times: once daily check feet daily for cuts, sores or redness enter blood sugar readings and medication or insulin into daily log take the blood sugar log to all doctor visits take the blood sugar meter to all doctor visits  trim toenails straight across drink 6 to 8 glasses of water each day eat fish at least once per week fill half of plate with vegetables limit fast food meals to no more than 1 per week manage portion size prepare main meal at home 3 to 5 days each week read  food labels for fat, fiber, carbohydrates and portion size set a realistic goal wash and dry feet carefully every day Too much potatoes, rice, pasta, bread at one meal with cause your blood sugar to go up Avoid concentrated sweets Choose water to drink as much as possible Continue walking- keep up the good work! Continue working towards goal of [facility] <7  Follow Up Plan: Telephone follow up appointment with care management team member scheduled for:  09/23/23 at 9 am       CCM (HYPERTENSION)  EXPECTED OUTCOME:  MONITOR,SELF- MANAGE AND REDUCE SYMPTOMS OF HYPERTENSION       Current Barriers:  Knowledge Deficits related to Hypertension Care Coordination needs related to obtaining a blood pressure cuff in a patient with hypertension Chronic Disease Management support and education needs related to importance of monitoring blood pressure, adherence to low sodium diet and exercise Patient lives with her sister, is independent with ADL, IADL's, uses county van for transportation, patient states she continues checking blood pressure few times per week with today's reading 106/65. Patient reports she continues to be mindful of sodium intake  Planned Interventions: Evaluation of current treatment plan related to hypertension self management and patient's adherence to plan as established by provider;   Reviewed medications with patient and discussed importance of compliance;  Counseled on the importance of exercise goals with target of 150 minutes per week Advised patient, providing education and rationale, to monitor blood pressure daily and record, calling PCP for findings outside established parameters;  Advised patient to discuss any issues with blood pressure with provider; Discussed complications of poorly controlled blood pressure such as heart disease, stroke, circulatory complications, vision complications, kidney impairment, sexual dysfunction;  Reinforced carbohydrate modified diet Reviewed  upcoming scheduled appointments  Symptom Management: Take medications as prescribed   Attend all scheduled provider appointments Call pharmacy for medication refills 3-7 days in advance of running out of medications Attend church or other social activities Perform all self care activities independently  Perform IADL's (shopping, preparing meals, housekeeping, managing finances) independently Call provider office for new concerns or questions  check blood pressure weekly choose a place to take my blood pressure (home, clinic or office, retail store) write blood pressure results in a log or diary learn about high blood pressure keep a blood pressure log take blood pressure log to all doctor appointments call doctor for signs and symptoms of high blood pressure develop an action plan for high blood pressure keep all doctor appointments take medications for blood pressure exactly as prescribed report new symptoms to your doctor eat more whole grains, fruits and vegetables, lean meats and healthy fats Continue to check your blood pressure, keep a log, take the log to the doctor with you Continue to follow a low sodium diet Avoid salty snacks, fast food Read food labels for sodium content  Follow Up Plan: Telephone follow up appointment with care management team member scheduled for:   09/23/23 at 9 am          Patient verbalizes understanding of instructions and care plan provided today and agrees to view in MyChart. Active MyChart status and patient understanding of how to access instructions and care  plan via MyChart confirmed with patient.  Telephone follow up appointment with care management team member scheduled for:  09/23/23 at 9 am

## 2023-07-25 ENCOUNTER — Ambulatory Visit (INDEPENDENT_AMBULATORY_CARE_PROVIDER_SITE_OTHER): Payer: Medicare Other

## 2023-07-25 VITALS — Ht 66.0 in | Wt 160.0 lb

## 2023-07-25 DIAGNOSIS — Z780 Asymptomatic menopausal state: Secondary | ICD-10-CM | POA: Diagnosis not present

## 2023-07-25 DIAGNOSIS — Z0000 Encounter for general adult medical examination without abnormal findings: Secondary | ICD-10-CM | POA: Diagnosis not present

## 2023-07-25 NOTE — Patient Instructions (Signed)
Renee [name] , Thank you for taking time to come for your Medicare Wellness Visit. I appreciate your ongoing commitment to your health goals. Please review the following plan we discussed and let me know if I can assist you in the future.   Referrals/Orders/Follow-Ups/Clinician Recommendations: Aim for 30 minutes of exercise or brisk walking, 6-8 glasses of water, and 5 servings of fruits and vegetables each day.   This is a list of the screening recommended for you and due dates:  Health Maintenance  Topic Date Due   COVID-19 Vaccine (1 - 2023-24 season) Never done   Colon Cancer Screening  09/11/2023*   Zoster (Shingles) Vaccine (1 of 2) 09/27/2023*   Pneumonia Vaccine (1 of 1 - PCV) 06/26/2024*   Flu Shot  07/28/2023   Yearly kidney health urinalysis for diabetes  09/11/2023   Hemoglobin A1C  12/28/2023   Eye exam for diabetics  06/14/2024   Yearly kidney function blood test for diabetes  06/26/2024   Complete foot exam   06/26/2024   Mammogram  07/03/2024   Medicare Annual Wellness Visit  07/24/2024   DEXA scan (bone density measurement)  Completed   Hepatitis C Screening  Completed   HPV Vaccine  Aged Out   DTaP/Tdap/Td vaccine  Discontinued  *Topic was postponed. The date shown is not the original due date.    Advanced directives: (In Chart) A copy of your advanced directives are scanned into your chart should your provider ever need it.  Next Medicare Annual Wellness Visit scheduled for next year: Yes  Preventive Care 85 Years and Older, Female Preventive care refers to lifestyle choices and visits with your health care provider that can promote health and wellness. What does preventive care include? A yearly physical exam. This is also called an annual well check. Dental exams once or twice a year. Routine eye exams. Ask your health care provider how often you should have your eyes checked. Personal lifestyle choices, including: Daily care of your teeth and gums. Regular  physical activity. Eating a healthy diet. Avoiding tobacco and drug use. Limiting alcohol use. Practicing safe sex. Taking low-dose aspirin every day. Taking vitamin and mineral supplements as recommended by your health care provider. What happens during an annual well check? The services and screenings done by your health care provider during your annual well check will depend on your age, overall health, lifestyle risk factors, and family history of disease. Counseling  Your health care provider may ask you questions about your: Alcohol use. Tobacco use. Drug use. Emotional well-being. Home and relationship well-being. Sexual activity. Eating habits. History of falls. Memory and ability to understand (cognition). Work and work Astronomer. Reproductive health. Screening  You may have the following tests or measurements: Height, weight, and BMI. Blood pressure. Lipid and cholesterol levels. These may be checked every 5 years, or more frequently if you are over 52 years old. Skin check. Lung cancer screening. You may have this screening every year starting at age 3 if you have a 30-pack-year history of smoking and currently smoke or have quit within the past 15 years. Fecal occult blood test (FOBT) of the stool. You may have this test every year starting at age 38. Flexible sigmoidoscopy or colonoscopy. You may have a sigmoidoscopy every 5 years or a colonoscopy every 10 years starting at age 83. Hepatitis C blood test. Hepatitis B blood test. Sexually transmitted disease (STD) testing. Diabetes screening. This is done by checking your blood sugar (glucose) after you have  not eaten for a while (fasting). You may have this done every 1-3 years. Bone density scan. This is done to screen for osteoporosis. You may have this done starting at age 4. Mammogram. This may be done every 1-2 years. Talk to your health care provider about how often you should have regular mammograms. Talk  with your health care provider about your test results, treatment options, and if necessary, the need for more tests. Vaccines  Your health care provider may recommend certain vaccines, such as: Influenza vaccine. This is recommended every year. Tetanus, diphtheria, and acellular pertussis (Tdap, Td) vaccine. You may need a Td booster every 10 years. Zoster vaccine. You may need this after age 36. Pneumococcal 13-valent conjugate (PCV13) vaccine. One dose is recommended after age 17. Pneumococcal polysaccharide (PPSV23) vaccine. One dose is recommended after age 9. Talk to your health care provider about which screenings and vaccines you need and how often you need them. This information is not intended to replace advice given to you by your health care provider. Make sure you discuss any questions you have with your health care provider. Document Released: 01/09/2016 Document Revised: 09/01/2016 Document Reviewed: 10/14/2015 Elsevier Interactive Patient Education  2017 [employer].  Fall Prevention in the Home Falls can cause injuries. They can happen to people of all ages. There are many things you can do to make your home safe and to help prevent falls. What can I do on the outside of my home? Regularly fix the edges of walkways and driveways and fix any cracks. Remove anything that might make you trip as you walk through a door, such as a raised step or threshold. Trim any bushes or trees on the path to your home. Use bright outdoor lighting. Clear any walking paths of anything that might make someone trip, such as rocks or tools. Regularly check to see if handrails are loose or broken. Make sure that both sides of any steps have handrails. Any raised decks and porches should have guardrails on the edges. Have any leaves, snow, or ice cleared regularly. Use sand or salt on walking paths during winter. Clean up any spills in your garage right away. This includes oil or grease  spills. What can I do in the bathroom? Use night lights. Install grab bars by the toilet and in the tub and shower. Do not use towel bars as grab bars. Use non-skid mats or decals in the tub or shower. If you need to sit down in the shower, use a plastic, non-slip stool. Keep the floor dry. Clean up any water that spills on the floor as soon as it happens. Remove soap buildup in the tub or shower regularly. Attach bath mats securely with double-sided non-slip rug tape. Do not have throw rugs and other things on the floor that can make you trip. What can I do in the bedroom? Use night lights. Make sure that you have a light by your bed that is easy to reach. Do not use any sheets or blankets that are too big for your bed. They should not hang down onto the floor. Have a firm chair that has side arms. You can use this for support while you get dressed. Do not have throw rugs and other things on the floor that can make you trip. What can I do in the kitchen? Clean up any spills right away. Avoid walking on wet floors. Keep items that you use a lot in easy-to-reach places. If you need to  reach something above you, use a strong step stool that has a grab bar. Keep electrical cords out of the way. Do not use floor polish or wax that makes floors slippery. If you must use wax, use non-skid floor wax. Do not have throw rugs and other things on the floor that can make you trip. What can I do with my stairs? Do not leave any items on the stairs. Make sure that there are handrails on both sides of the stairs and use them. Fix handrails that are broken or loose. Make sure that handrails are as long as the stairways. Check any carpeting to make sure that it is firmly attached to the stairs. Fix any carpet that is loose or worn. Avoid having throw rugs at the top or bottom of the stairs. If you do have throw rugs, attach them to the floor with carpet tape. Make sure that you have a light switch at the  top of the stairs and the bottom of the stairs. If you do not have them, ask someone to add them for you. What else can I do to help prevent falls? Wear shoes that: Do not have high heels. Have rubber bottoms. Are comfortable and fit you well. Are closed at the toe. Do not wear sandals. If you use a stepladder: Make sure that it is fully opened. Do not climb a closed stepladder. Make sure that both sides of the stepladder are locked into place. Ask someone to hold it for you, if possible. Clearly mark and make sure that you can see: Any grab bars or handrails. First and last steps. Where the edge of each step is. Use tools that help you move around (mobility aids) if they are needed. These include: Canes. Walkers. Scooters. Crutches. Turn on the lights when you go into a dark area. Replace any light bulbs as soon as they burn out. Set up your furniture so you have a clear path. Avoid moving your furniture around. If any of your floors are uneven, fix them. If there are any pets around you, be aware of where they are. Review your medicines with your doctor. Some medicines can make you feel dizzy. This can increase your chance of falling. Ask your doctor what other things that you can do to help prevent falls. This information is not intended to replace advice given to you by your health care provider. Make sure you discuss any questions you have with your health care provider. Document Released: 10/09/2009 Document Revised: 05/20/2016 Document Reviewed: 01/17/2015 Elsevier Interactive Patient Education  2017 [employer].

## 2023-07-25 NOTE — Progress Notes (Signed)
Subjective:   Renee [name] is a 72 y.o. female who presents for Medicare Annual (Subsequent) preventive examination.  Visit Complete: Virtual  I connected with  Renee [name] on 07/25/23 by a audio enabled telemedicine application and verified that I am speaking with the correct person using two identifiers.  Patient Location: Home  Provider Location: Home Office  I discussed the limitations of evaluation and management by telemedicine. The patient expressed understanding and agreed to proceed.  Patient Medicare AWV questionnaire was completed by the patient on 07/25/2023; I have confirmed that all information answered by patient is correct and no changes since this date.  Review of Systems    Vital Signs: Unable to obtain new vitals due to this being a telehealth visit.  Cardiac Risk Factors include: advanced age (>66men, >47 women);diabetes mellitus;dyslipidemia;hypertension     Objective:    Today's Vitals   07/25/23 1532  Weight: 160 lb (72.6 kg)  Height: 5\' 6"  (1.676 m)   Body mass index is 25.82 kg/m.     07/25/2023    3:38 PM 10/14/2022   11:48 AM 07/23/2022    3:35 PM 07/22/2021    8:50 AM 05/08/2021   11:23 AM 11/21/2020    7:49 AM 07/21/2020    9:36 AM  Advanced Directives  Does Patient Have a Medical Advance Directive? Yes Yes Yes No No Unable to assess, patient is non-responsive or altered mental status Yes  Type of Public librarian Power of [place];Living will Healthcare Power of [place];Living will Healthcare Power of [place];Living will    Healthcare Power of Attorney  Does patient want to make changes to medical advance directive? No - Patient declined No - Patient declined     No - Patient declined  Copy of Healthcare Power of Attorney in Chart? Yes - validated most recent copy scanned in chart (See row information) No - copy requested Yes - validated most recent copy scanned in chart (See row information)    Yes - validated most recent  copy scanned in chart (See row information)  Would patient like information on creating a medical advance directive?    No - Patient declined No - Patient declined      Current Medications (verified) Outpatient Encounter Medications as of 07/25/2023  Medication Sig   aspirin 81 MG tablet Take 81 mg by mouth daily.   atorvastatin (LIPITOR) 40 MG tablet Take 1 tablet (40 mg total) by mouth daily.   Blood Glucose Calibration (ONETOUCH VERIO) SOLN Use as needed to calibrate glucometer   Blood Glucose Monitoring Suppl (ONETOUCH VERIO FLEX SYSTEM) w/Device KIT Use to check blood sugar twice daily   Cholecalciferol (VITAMIN D3) 10 MCG (400 UNIT) CAPS Take by mouth.   dapagliflozin propanediol (FARXIGA) 10 MG TABS tablet Take 1 tablet (10 mg total) by mouth daily before breakfast.   docusate sodium (COLACE) 100 MG capsule Take 100 mg by mouth daily.   gabapentin (NEURONTIN) 400 MG capsule Take 1 capsule (400 mg total) by mouth 3 (three) times daily.   glipiZIDE (GLUCOTROL) 10 MG tablet Take 1 tablet (10 mg total) by mouth 2 (two) times daily before a meal.   glucose blood (ONETOUCH VERIO) test strip CHECK BLOOD SUGAR TWICE DAILY Dx E11.9   omeprazole (PRILOSEC) 20 MG capsule Take 1 capsule (20 mg total) by mouth daily.   ONETOUCH DELICA LANCETS 33G MISC Use to check BG up to bid.  Dx: E11.65 type 2 DM with insulin therapy, uncontrolled   valsartan-hydrochlorothiazide (  DIOVAN-HCT) 160-25 MG tablet Take 1 tablet by mouth daily.   No facility-administered encounter medications on file as of 07/25/2023.    Allergies (verified) Metformin and related and Penicillins   History: Past Medical History:  Diagnosis Date   Arthritis    Breast cancer (HCC) 2001   right mastectomy   CVA (cerebral infarction) 2003, 2005   DM (diabetes mellitus) (HCC)    GERD (gastroesophageal reflux disease)    Heart murmur    Hyperlipidemia    Hypertension    Seizures (HCC)    Vitamin D deficiency    Past Surgical  History:  Procedure Laterality Date   ABDOMINAL HYSTERECTOMY  2000   GANGLION CYST EXCISION  2010   right (2010)  and left (before 2010) hand   MASTECTOMY  2001   right   TONSILECTOMY/ADENOIDECTOMY WITH MYRINGOTOMY Bilateral    age 65's   Family History  Problem Relation Age of Onset   Alzheimer's disease Mother    Parkinson's disease Mother    Heart Problems Father    Kidney disease Father        +dialysis   Heart disease Father    Other Sister        +TAH for fibroids   Breast cancer Sister        dx. 55-56   Renal cancer Sister        dx. 62-63; not a smoker   Early death Brother    Diabetes Brother    Cancer Paternal Aunt        NOS cancer   Colon cancer Neg Hx    Stomach cancer Neg Hx    Social History   Socioeconomic History   Marital status: Single    Spouse name: Not on file   Number of children: Not on file   Years of education: 12   Highest education level: Associate degree: academic program  Occupational History   Occupation: Retired    Associate Professor: [employer]    Comment: [employer]   Tobacco Use   Smoking status: Never   Smokeless tobacco: Never  Vaping Use   Vaping status: Never Used  Substance and Sexual Activity   Alcohol use: Not Currently    Alcohol/week: 1.0 standard drink of alcohol    Types: 1 Glasses of wine per week   Drug use: No   Sexual activity: Not Currently    Birth control/protection: Post-menopausal  Other Topics Concern   Not on file  Social History Narrative   Retired from school system, single, no children. Enjoys reading and word puzzles.    Jehovah's Witness - goes to [employer] get together [place], Saturdays   Her parents left their home to her and her sister   Social Determinants of Health   Financial Resource Strain: Low Risk  (07/25/2023)   Overall Financial Resource Strain (CARDIA)    Difficulty of Paying Living Expenses: Not hard at all  Food Insecurity: No  Food Insecurity (07/25/2023)   Hunger Vital Sign    Worried About Running Out of Food in the Last Year: Never true    Ran Out of Food in the Last Year: Never true  Transportation Needs: No Transportation Needs (07/25/2023)   PRAPARE - Administrator, Civil Service (Medical): No    Lack of Transportation (Non-Medical): No  Physical Activity: Insufficiently Active (07/25/2023)   Exercise Vital Sign    Days of Exercise per Week: 3 days  Minutes of Exercise per Session: 30 min  Stress: No Stress Concern Present (07/25/2023)   [employer] of Occupational Health - Occupational Stress Questionnaire    Feeling of Stress : Not at all  Social Connections: Moderately Isolated (07/25/2023)   Social Connection and Isolation Panel [NHANES]    Frequency of Communication with Friends and Family: More than three times a week    Frequency of Social Gatherings with Friends and Family: More than three times a week    Attends Religious Services: More than 4 times per year    Active Member of [employer] or Organizations: No    Attends Banker Meetings: Never    Marital Status: Never married    Tobacco Counseling Counseling given: Not Answered   Clinical Intake:  Pre-visit preparation completed: Yes  Pain : No/denies painNutrition Risk Assessment:  Has the patient had any N/V/D within the last 2 months?  No  Does the patient have any non-healing wounds?  No  Has the patient had any unintentional weight loss or weight gain?  No   Diabetes:  Is the patient diabetic?  Yes  If diabetic, was a CBG obtained today?  No  Did the patient bring in their glucometer from home?  No  How often do you monitor your CBG's? Daily .   Financial Strains and Diabetes Management:  Are you having any financial strains with the device, your supplies or your medication? No .  Does the patient want to be seen by Chronic Care Management for management of their diabetes?  No  Would the patient  like to be referred to a Nutritionist or for Diabetic Management?  No   Diabetic Exams:  Diabetic Eye Exam: Completed 03/2023 Diabetic Foot Exam: Overdue, Pt has been advised about the importance in completing this exam. Pt is scheduled for diabetic foot exam on next office visit .      Nutritional Risks: None Diabetes: Yes CBG done?: No Did pt. bring in CBG monitor from home?: No  How often do you need to have someone help you when you read instructions, pamphlets, or other written materials from your doctor or pharmacy?: 1 - Never  Interpreter Needed?: No  Information entered by :: Renie Ora, LPN   Activities of Daily Living    07/25/2023    3:38 PM 10/14/2022   11:54 AM  In your present state of health, do you have any difficulty performing the following activities:  Hearing? 0 0  Vision? 0 0  Difficulty concentrating or making decisions? 0   Walking or climbing stairs? 0 0  Comment  takes extra time but able to independently climb stairs  Dressing or bathing? 0 0  Doing errands, shopping? 0 0  Comment  uses county Marine scientist and eating ? N N  Using the Toilet? N N  In the past six months, have you accidently leaked urine? N N  Do you have problems with loss of bowel control? N N  Managing your Medications? N N  Managing your Finances? N N  Housekeeping or managing your Housekeeping? N N    Patient Care Team: Junie Spencer, FNP as PCP - General (Nurse Practitioner) Tat, Octaviano Batty, DO as Consulting Physician (Neurology) Pyrtle, Carie Caddy, MD as Consulting Physician (Gastroenterology) Roma Kayser, MD as Consulting Physician (Endocrinology) Adam Phenix, DPM as Consulting Physician (Podiatry) Cresenciano Genre, Lilla Shook, [facility] as Pharmacist (Family Medicine) Michaelle Copas, MD as Referring Physician (Optometry) Irving Shows  A, RN as [facility] Therapist, music  Indicate any recent Medical Services you may have received from  other than [facility] providers in the past year (date may be approximate).     Assessment:   This is a routine wellness examination for [place].  Hearing/Vision screen Vision Screening - Comments:: Wears rx glasses - up to date with routine eye exams with  Dr.Le   Dietary issues and exercise activities discussed:     Goals Addressed             This Visit's Progress    Patient Stated   On track    07/23/2022 AWV Goal: Exercise for General Health  Patient will verbalize understanding of the benefits of increased physical activity: Exercising regularly is important. It will improve your overall fitness, flexibility, and endurance. Regular exercise also will improve your overall health. It can help you control your weight, reduce stress, and improve your bone density. Over the next year, patient will increase physical activity as tolerated with a goal of at least 150 minutes of moderate physical activity per week.  You can tell that you are exercising at a moderate intensity if your heart starts beating faster and you start breathing faster but can still hold a conversation. Moderate-intensity exercise ideas include: Walking 1 mile (1.6 km) in about 15 minutes Biking Hiking Golfing Dancing Water aerobics Patient will verbalize understanding of everyday activities that increase physical activity by providing examples like the following: Yard work, such as: Insurance underwriter Gardening Washing windows or floors Patient will be able to explain general safety guidelines for exercising:  Before you start a new exercise program, talk with your health care provider. Do not exercise so much that you hurt yourself, feel dizzy, or get very short of breath. Wear comfortable clothes and wear shoes with good support. Drink plenty of water while you exercise to prevent dehydration or heat stroke. Work out until  your breathing and your heartbeat get faster.        Depression Screen    07/25/2023    3:37 PM 03/24/2023    9:46 AM 12/23/2022    9:34 AM 10/14/2022   11:39 AM 09/10/2022   10:32 AM 07/23/2022    3:42 PM 06/08/2022   10:03 AM  PHQ 2/9 Scores  PHQ - 2 Score 0 0 0 0 0 0 0  PHQ- 9 Score  0 0   0 0    Fall Risk    07/25/2023    3:35 PM 06/27/2023   10:02 AM 06/27/2023   10:00 AM 03/24/2023    9:46 AM 12/23/2022    9:34 AM  Fall Risk   Falls in the past year? 0 0 0 0 0  Number falls in past yr: 0 0 0    Injury with Fall? 0 0 0    Risk for fall due to : No Fall Risks No Fall Risks No Fall Risks    Follow up Falls prevention discussed Falls evaluation completed;Education provided Falls evaluation completed;Education provided      MEDICARE RISK AT HOME:  Medicare Risk at Home - 07/25/23 1535     Any stairs in or around the home? No    If so, are there any without handrails? No    Home free of loose throw rugs in walkways, pet beds, electrical cords, etc? Yes    Adequate lighting in your home to  reduce risk of falls? Yes    Life alert? No    Use of a cane, walker or w/c? No    Grab bars in the bathroom? No    Shower chair or bench in shower? No    Elevated toilet seat or a handicapped toilet? No             TIMED UP AND GO:  Was the test performed?  No    Cognitive Function:    07/14/2017   10:04 AM 07/05/2016   10:24 AM 07/05/2016   10:23 AM  MMSE - Mini Mental State Exam  Not completed: Unable to complete    Orientation to time 5 5   Orientation to Place 5 5   Registration 3 3 3   Attention/ Calculation 1 5   Recall 3 2   Language- name 2 objects 2 2   Language- repeat 1 1   Language- follow 3 step command 3 3   Language- read & follow direction 0 1   Write a sentence 0 1   Copy design 1 1   Total score 24 29         07/25/2023    3:39 PM 07/23/2022    3:44 PM 07/22/2021    8:54 AM 07/21/2020    9:41 AM 06/05/2019   10:55 AM  6CIT Screen  What Year? 0  points 0 points 0 points 0 points 0 points  What month? 0 points 0 points 0 points 0 points 0 points  What time? 0 points 0 points 0 points 0 points 0 points  Count back from 20 0 points 0 points 0 points 0 points 2 points  Months in reverse 0 points 0 points 0 points 4 points 0 points  Repeat phrase 0 points 2 points 2 points 2 points 0 points  Total Score 0 points 2 points 2 points 6 points 2 points    Immunizations  There is no immunization history on file for this patient.  TDAP status: Due, Education has been provided regarding the importance of this vaccine. Advised may receive this vaccine at local pharmacy or Health Dept. Aware to provide a copy of the vaccination record if obtained from local pharmacy or Health Dept. Verbalized acceptance and understanding.  Flu Vaccine status: Declined, Education has been provided regarding the importance of this vaccine but patient still declined. Advised may receive this vaccine at local pharmacy or Health Dept. Aware to provide a copy of the vaccination record if obtained from local pharmacy or Health Dept. Verbalized acceptance and understanding.  Pneumococcal vaccine status: Declined,  Education has been provided regarding the importance of this vaccine but patient still declined. Advised may receive this vaccine at local pharmacy or Health Dept. Aware to provide a copy of the vaccination record if obtained from local pharmacy or Health Dept. Verbalized acceptance and understanding.   Covid-19 vaccine status: Declined, Education has been provided regarding the importance of this vaccine but patient still declined. Advised may receive this vaccine at local pharmacy or Health Dept.or vaccine clinic. Aware to provide a copy of the vaccination record if obtained from local pharmacy or Health Dept. Verbalized acceptance and understanding.  Qualifies for Shingles Vaccine? Yes   Zostavax completed No   Shingrix Completed?: No.    Education has been  provided regarding the importance of this vaccine. Patient has been advised to call insurance company to determine out of pocket expense if they have not yet received this vaccine. Advised may  also receive vaccine at local pharmacy or Health Dept. Verbalized acceptance and understanding.  Screening Tests Health Maintenance  Topic Date Due   COVID-19 Vaccine (1 - 2023-24 season) Never done   Colonoscopy  09/11/2023 (Originally 08/17/2022)   Zoster Vaccines- Shingrix (1 of 2) 09/27/2023 (Originally 03/01/2000)   Pneumonia Vaccine 2+ Years old (1 of 1 - PCV) 06/26/2024 (Originally 03/02/2015)   INFLUENZA VACCINE  07/28/2023   Diabetic kidney evaluation - Urine ACR  09/11/2023   HEMOGLOBIN A1C  12/28/2023   OPHTHALMOLOGY EXAM  06/14/2024   Diabetic kidney evaluation - eGFR measurement  06/26/2024   FOOT EXAM  06/26/2024   MAMMOGRAM  07/03/2024   Medicare Annual Wellness (AWV)  07/24/2024   DEXA SCAN  Completed   Hepatitis C Screening  Completed   HPV VACCINES  Aged Out   DTaP/Tdap/Td  Discontinued    Health Maintenance  Health Maintenance Due  Topic Date Due   COVID-19 Vaccine (1 - 2023-24 season) Never done    Colorectal cancer screening: Referral to GI placed declined will discuss with Pcp . Pt aware the office will call re: appt.  Mammogram status: Completed 07/0/2024. Repeat every year  Bone Density status: Ordered 07/25/2023. Pt provided with contact info and advised to call to schedule appt.  Lung Cancer Screening: (Low Dose CT Chest recommended if Age 55-80 years, 20 pack-year currently smoking OR have quit w/in 15years.) does not qualify.   Lung Cancer Screening Referral: n/a  Additional Screening:  Hepatitis C Screening: does not qualify; Completed 12/030/2016  Vision Screening: Recommended annual ophthalmology exams for early detection of glaucoma and other disorders of the eye. Is the patient up to date with their annual eye exam?  Yes  Who is the provider or what is  the name of the office in which the patient attends annual eye exams? Dr.Le  If pt is not established with a provider, would they like to be referred to a provider to establish care? No .   Dental Screening: Recommended annual dental exams for proper oral hygiene    Community Resource Referral / Chronic Care Management: CRR required this visit?  No   CCM required this visit?  No     Plan:     I have personally reviewed and noted the following in the patient's chart:   Medical and social history Use of alcohol, tobacco or illicit drugs  Current medications and supplements including opioid prescriptions. Patient is not currently taking opioid prescriptions. Functional ability and status Nutritional status Physical activity Advanced directives List of other physicians Hospitalizations, surgeries, and ER visits in previous 12 months Vitals Screenings to include cognitive, depression, and falls Referrals and appointments  In addition, I have reviewed and discussed with patient certain preventive protocols, quality metrics, and best practice recommendations. A written personalized care plan for preventive services as well as general preventive health recommendations were provided to patient.     Lorrene Reid, LPN   04/04/8118   After Visit Summary: (MyChart) Due to this being a telephonic visit, the after visit summary with patients personalized plan was offered to patient via MyChart   Nurse Notes: Declines all Vaccines , Will discuss Colonoscopy with Pcp next  ov 08/06/2023

## 2023-07-27 DIAGNOSIS — E1159 Type 2 diabetes mellitus with other circulatory complications: Secondary | ICD-10-CM | POA: Diagnosis not present

## 2023-07-27 DIAGNOSIS — Z7984 Long term (current) use of oral hypoglycemic drugs: Secondary | ICD-10-CM

## 2023-07-27 DIAGNOSIS — I10 Essential (primary) hypertension: Secondary | ICD-10-CM | POA: Diagnosis not present

## 2023-08-09 ENCOUNTER — Encounter: Payer: Self-pay | Admitting: Family

## 2023-08-09 ENCOUNTER — Telehealth: Payer: Self-pay | Admitting: Family

## 2023-08-09 DIAGNOSIS — M79676 Pain in unspecified toe(s): Secondary | ICD-10-CM | POA: Diagnosis not present

## 2023-08-09 DIAGNOSIS — L84 Corns and callosities: Secondary | ICD-10-CM | POA: Diagnosis not present

## 2023-08-09 DIAGNOSIS — E1151 Type 2 diabetes mellitus with diabetic peripheral angiopathy without gangrene: Secondary | ICD-10-CM | POA: Diagnosis not present

## 2023-08-09 DIAGNOSIS — B351 Tinea unguium: Secondary | ICD-10-CM | POA: Diagnosis not present

## 2023-08-09 NOTE — Telephone Encounter (Signed)
Ok for letter.   Jannifer Rodney, FNP

## 2023-08-09 NOTE — Telephone Encounter (Signed)
Letter written and ready to pick pt aware

## 2023-09-23 ENCOUNTER — Telehealth: Payer: Self-pay | Admitting: *Deleted

## 2023-09-23 ENCOUNTER — Other Ambulatory Visit: Payer: Medicare Other | Admitting: *Deleted

## 2023-09-23 ENCOUNTER — Telehealth: Payer: Medicare Other

## 2023-09-23 NOTE — Patient Outreach (Signed)
Care Management   Follow Up Note   09/23/2023 Name: Renee [name] MRN: [id number] DOB: 1951-11-03   Referred by: Junie Spencer, FNP Reason for referral : Care Coordination (DM2)   An unsuccessful telephone outreach was attempted today. The patient was referred to the case management team for assistance with care management and care coordination.     Follow Up Plan: Telephone follow up appointment with care management team member scheduled for: upon care guide rescheduling.  Irving Shows [facility], BSN [place]/ Ambulatory Care Management [phone]

## 2023-09-27 ENCOUNTER — Ambulatory Visit (INDEPENDENT_AMBULATORY_CARE_PROVIDER_SITE_OTHER): Payer: Medicare Other

## 2023-09-27 ENCOUNTER — Encounter: Payer: Self-pay | Admitting: Family

## 2023-09-27 ENCOUNTER — Ambulatory Visit (INDEPENDENT_AMBULATORY_CARE_PROVIDER_SITE_OTHER): Payer: Medicare Other | Admitting: Family

## 2023-09-27 VITALS — BP 134/78 | HR 73 | Temp 97.5°F | Ht 66.0 in | Wt 162.2 lb

## 2023-09-27 DIAGNOSIS — E663 Overweight: Secondary | ICD-10-CM

## 2023-09-27 DIAGNOSIS — E785 Hyperlipidemia, unspecified: Secondary | ICD-10-CM | POA: Diagnosis not present

## 2023-09-27 DIAGNOSIS — E1159 Type 2 diabetes mellitus with other circulatory complications: Secondary | ICD-10-CM

## 2023-09-27 DIAGNOSIS — Z780 Asymptomatic menopausal state: Secondary | ICD-10-CM

## 2023-09-27 DIAGNOSIS — K2100 Gastro-esophageal reflux disease with esophagitis, without bleeding: Secondary | ICD-10-CM | POA: Diagnosis not present

## 2023-09-27 DIAGNOSIS — I152 Hypertension secondary to endocrine disorders: Secondary | ICD-10-CM

## 2023-09-27 DIAGNOSIS — Z8673 Personal history of transient ischemic attack (TIA), and cerebral infarction without residual deficits: Secondary | ICD-10-CM | POA: Diagnosis not present

## 2023-09-27 DIAGNOSIS — Z91199 Patient's noncompliance with other medical treatment and regimen due to unspecified reason: Secondary | ICD-10-CM

## 2023-09-27 DIAGNOSIS — E1142 Type 2 diabetes mellitus with diabetic polyneuropathy: Secondary | ICD-10-CM

## 2023-09-27 DIAGNOSIS — Z0000 Encounter for general adult medical examination without abnormal findings: Secondary | ICD-10-CM

## 2023-09-27 DIAGNOSIS — Z0001 Encounter for general adult medical examination with abnormal findings: Secondary | ICD-10-CM

## 2023-09-27 DIAGNOSIS — E1169 Type 2 diabetes mellitus with other specified complication: Secondary | ICD-10-CM | POA: Diagnosis not present

## 2023-09-27 DIAGNOSIS — I10 Essential (primary) hypertension: Secondary | ICD-10-CM

## 2023-09-27 LAB — BAYER DCA HB A1C WAIVED: HB A1C (BAYER DCA - WAIVED): 10.4 % — ABNORMAL HIGH (ref 4.8–5.6)

## 2023-09-27 NOTE — Patient Instructions (Signed)

## 2023-09-27 NOTE — Progress Notes (Signed)
Subjective:    Patient ID: Renee [name], female    DOB: August 13, 1951, 72 y.o.   MRN: [id number]  Chief Complaint  Patient presents with   Medical Management of Chronic Issues   Pt presents to the office today for CPE and chronic follow up.    Her DM is uncontrolled and is followed by Podiatry every 8 weeks. She has seen Endocrinologists, but states she has not seen them in awhile because of transportation. Her last A1C was 11.3.   She refuses any medication changes because she is scared of side effects. She has stopped her Januvia because it was "not working".    She has diabetic neuropathy and takes gabapentin 400 mg TID. States her pain is an aching, burning  pain of 7 out 10 with medications.    Has hx of CVA.  Hypertension This is a chronic problem. The current episode started more than 1 year ago. The problem has been resolved since onset. The problem is controlled. Associated symptoms include blurred vision and malaise/fatigue. Pertinent negatives include no peripheral edema or shortness of breath. Risk factors for coronary artery disease include dyslipidemia, diabetes mellitus and sedentary lifestyle. The current treatment provides moderate improvement. Hypertensive end-organ damage includes CVA.  Gastroesophageal Reflux She complains of belching and heartburn. This is a chronic problem. The current episode started more than 1 year ago. The problem occurs occasionally. She has tried a PPI for the symptoms. The treatment provided moderate relief.  Hyperlipidemia This is a chronic problem. The current episode started more than 1 year ago. The problem is controlled. Recent lipid tests were reviewed and are normal. Exacerbating diseases include obesity. Pertinent negatives include no shortness of breath. Current antihyperlipidemic treatment includes statins. The current treatment provides moderate improvement of lipids. Risk factors for coronary artery disease include dyslipidemia,  diabetes mellitus, hypertension, a sedentary lifestyle and post-menopausal.  Diabetes She presents for her follow-up diabetic visit. She has type 2 diabetes mellitus. Associated symptoms include blurred vision and foot paresthesias. Symptoms are stable. Diabetic complications include a CVA and peripheral neuropathy. Risk factors for coronary artery disease include dyslipidemia, diabetes mellitus, hypertension, sedentary lifestyle and post-menopausal. (Does not check regularly)      Review of Systems  Constitutional:  Positive for malaise/fatigue.  Eyes:  Positive for blurred vision.  Respiratory:  Negative for shortness of breath.   Gastrointestinal:  Positive for heartburn.  All other systems reviewed and are negative.  Family History  Problem Relation Age of Onset   Alzheimer's disease Mother    Parkinson's disease Mother    Heart Problems Father    Kidney disease Father        +dialysis   Heart disease Father    Other Sister        +TAH for fibroids   Breast cancer Sister        dx. 55-56   Renal cancer Sister        dx. 65-63; not a smoker   Early death Brother    Diabetes Brother    Cancer Paternal Aunt        NOS cancer   Colon cancer Neg Hx    Stomach cancer Neg Hx    Social History   Socioeconomic History   Marital status: Single    Spouse name: Not on file   Number of children: Not on file   Years of education: 12   Highest education level: Associate degree: academic program  Occupational History   Occupation: Retired  Employer: [employer]    Comment: [employer]   Tobacco Use   Smoking status: Never   Smokeless tobacco: Never  Vaping Use   Vaping status: Never Used  Substance and Sexual Activity   Alcohol use: Not Currently    Alcohol/week: 1.0 standard drink of alcohol    Types: 1 Glasses of wine per week   Drug use: No   Sexual activity: Not Currently    Birth control/protection: Post-menopausal  Other  Topics Concern   Not on file  Social History Narrative   Retired from school system, single, no children. Enjoys reading and word puzzles.    Jehovah's Witness - goes to [employer] get together [place] [employer], Saturdays   Her parents left their home to her and her sister   Social Determinants of Health   Financial Resource Strain: Low Risk  (07/25/2023)   Overall Financial Resource Strain (CARDIA)    Difficulty of Paying Living Expenses: Not hard at all  Food Insecurity: No Food Insecurity (07/25/2023)   Hunger Vital Sign    Worried About Running Out of Food in the Last Year: Never true    Ran Out of Food in the Last Year: Never true  Transportation Needs: No Transportation Needs (07/25/2023)   PRAPARE - Administrator, Civil Service (Medical): No    Lack of Transportation (Non-Medical): No  Physical Activity: Insufficiently Active (07/25/2023)   Exercise Vital Sign    Days of Exercise per Week: 3 days    Minutes of Exercise per Session: 30 min  Stress: No Stress Concern Present (07/25/2023)   [employer] of Occupational Health - Occupational Stress Questionnaire    Feeling of Stress : Not at all  Social Connections: Moderately Isolated (07/25/2023)   Social Connection and Isolation Panel [NHANES]    Frequency of Communication with Friends and Family: More than three times a week    Frequency of Social Gatherings with Friends and Family: More than three times a week    Attends Religious Services: More than 4 times per year    Active Member of [employer] or Organizations: No    Attends Banker Meetings: Never    Marital Status: Never married       Objective:   Physical Exam Vitals reviewed.  Constitutional:      General: She is not in acute distress.    Appearance: She is well-developed.  HENT:     Head: Normocephalic and atraumatic.     Right Ear: Tympanic membrane normal.     Left Ear: Tympanic membrane normal.  Eyes:     Pupils: Pupils are  equal, round, and reactive to light.  Neck:     Thyroid: No thyromegaly.  Cardiovascular:     Rate and Rhythm: Normal rate and regular rhythm.     Heart sounds: Normal heart sounds. No murmur heard. Pulmonary:     Effort: Pulmonary effort is normal. No respiratory distress.     Breath sounds: Normal breath sounds. No wheezing.  Abdominal:     General: Bowel sounds are normal. There is no distension.     Palpations: Abdomen is soft.     Tenderness: There is no abdominal tenderness.  Musculoskeletal:        General: No tenderness. Normal range of motion.     Cervical back: Normal range of motion and neck supple.  Skin:    General: Skin is warm and dry.  Neurological:  Mental Status: She is alert and oriented to person, place, and time.     Cranial Nerves: No cranial nerve deficit.     Deep Tendon Reflexes: Reflexes are normal and symmetric.  Psychiatric:        Behavior: Behavior normal.        Thought Content: Thought content normal.        Judgment: Judgment normal.      BP 134/78   Pulse 73   Temp (!) 97.5 F (36.4 C) (Temporal)   Ht 5\' 6"  (1.676 m)   Wt 162 lb 3.2 oz (73.6 kg)   BMI 26.18 kg/m      Assessment & Plan:  Renee [name] comes in today with chief complaint of Medical Management of Chronic Issues   Diagnosis and orders addressed:  1. Type 2 diabetes mellitus with diabetic polyneuropathy, without long-term current use of insulin (HCC) - Bayer DCA Hb A1c Waived - Microalbumin / creatinine urine ratio - CMP14+EGFR - CBC with Differential/Platelet - TSH  2. Diabetic peripheral neuropathy (HCC) - CMP14+EGFR - CBC with Differential/Platelet  3. Essential hypertension, benign - CMP14+EGFR - CBC with Differential/Platelet  4. Gastroesophageal reflux disease with esophagitis, unspecified whether hemorrhage - CMP14+EGFR - CBC with Differential/Platelet  5. History of CVA (cerebrovascular accident) - CMP14+EGFR - CBC with  Differential/Platelet  6. Hyperlipidemia associated with type 2 diabetes mellitus (HCC) - CMP14+EGFR - CBC with Differential/Platelet - Lipid panel  7. Hypertension associated with diabetes (HCC) - CMP14+EGFR - CBC with Differential/Platelet - Lipid panel  8. Non-compliance - CMP14+EGFR - CBC with Differential/Platelet  9. Overweight (BMI 25.0-29.9) - CMP14+EGFR - CBC with Differential/Platelet  10. Annual physical exam - Bayer DCA Hb A1c Waived - Microalbumin / creatinine urine ratio - CMP14+EGFR - CBC with Differential/Platelet - Lipid panel - TSH  11. Post-menopausal - CBC with Differential/Platelet   Labs pending Strict low carb diet  Continue medications, pt refuses any medication changes at this time.  Health Maintenance reviewed- Refuses all vaccines Diet and exercise encouraged  Follow up plan: 3 months    Jannifer Rodney, FNP

## 2023-09-28 LAB — CMP14+EGFR
ALT: 22 IU/L (ref 0–32)
AST: 21 IU/L (ref 0–40)
Albumin: 4.4 g/dL (ref 3.8–4.8)
Alkaline Phosphatase: 92 IU/L (ref 44–121)
BUN/Creatinine Ratio: 24 (ref 12–28)
BUN: 33 mg/dL — ABNORMAL HIGH (ref 8–27)
Bilirubin Total: 0.4 mg/dL (ref 0.0–1.2)
CO2: 23 mmol/L (ref 20–29)
Calcium: 10.4 mg/dL — ABNORMAL HIGH (ref 8.7–10.3)
Chloride: 100 mmol/L (ref 96–106)
Creatinine, Ser: 1.36 mg/dL — ABNORMAL HIGH (ref 0.57–1.00)
Globulin, Total: 2.8 g/dL (ref 1.5–4.5)
Glucose: 170 mg/dL — ABNORMAL HIGH (ref 70–99)
Potassium: 4.5 mmol/L (ref 3.5–5.2)
Sodium: 139 mmol/L (ref 134–144)
Total Protein: 7.2 g/dL (ref 6.0–8.5)
eGFR: 41 mL/min/{1.73_m2} — ABNORMAL LOW (ref >59)

## 2023-09-28 LAB — MICROALBUMIN / CREATININE URINE RATIO
Creatinine, Urine: 81.1 mg/dL
Microalb/Creat Ratio: 14 mg/g{creat} (ref 0–29)
Microalbumin, Urine: 11.4 ug/mL

## 2023-09-28 LAB — CBC WITH DIFFERENTIAL/PLATELET
Basophils Absolute: 0.1 10*3/uL (ref 0.0–0.2)
Basos: 1 %
EOS (ABSOLUTE): 0.1 10*3/uL (ref 0.0–0.4)
Eos: 2 %
Hematocrit: 39.3 % (ref 34.0–46.6)
Hemoglobin: 12.3 g/dL (ref 11.1–15.9)
Immature Grans (Abs): 0 10*3/uL (ref 0.0–0.1)
Immature Granulocytes: 0 %
Lymphocytes Absolute: 2 10*3/uL (ref 0.7–3.1)
Lymphs: 34 %
MCH: 28.4 pg (ref 26.6–33.0)
MCHC: 31.3 g/dL — ABNORMAL LOW (ref 31.5–35.7)
MCV: 91 fL (ref 79–97)
Monocytes Absolute: 0.5 10*3/uL (ref 0.1–0.9)
Monocytes: 9 %
Neutrophils Absolute: 3.2 10*3/uL (ref 1.4–7.0)
Neutrophils: 54 %
Platelets: 321 10*3/uL (ref 150–450)
RBC: 4.33 x10E6/uL (ref 3.77–5.28)
RDW: 12.7 % (ref 11.7–15.4)
WBC: 6 10*3/uL (ref 3.4–10.8)

## 2023-09-28 LAB — LIPID PANEL
Cholesterol, Total: 178 mg/dL (ref 100–199)
HDL: 49 mg/dL (ref >39)
LDL CALC COMMENT:: 3.6 ratio (ref 0.0–4.4)
LDL Chol Calc (NIH): 103 mg/dL — ABNORMAL HIGH (ref 0–99)
Triglycerides: 145 mg/dL (ref 0–149)
VLDL Cholesterol Cal: 26 mg/dL (ref 5–40)

## 2023-09-28 LAB — TSH: TSH: 0.824 u[IU]/mL (ref 0.450–4.500)

## 2023-09-29 DIAGNOSIS — Z780 Asymptomatic menopausal state: Secondary | ICD-10-CM | POA: Diagnosis not present

## 2023-10-02 ENCOUNTER — Other Ambulatory Visit: Payer: Self-pay | Admitting: Family

## 2023-10-02 DIAGNOSIS — K2100 Gastro-esophageal reflux disease with esophagitis, without bleeding: Secondary | ICD-10-CM

## 2023-10-18 ENCOUNTER — Other Ambulatory Visit: Payer: Self-pay | Admitting: *Deleted

## 2023-10-18 ENCOUNTER — Encounter: Payer: Self-pay | Admitting: *Deleted

## 2023-10-18 DIAGNOSIS — B351 Tinea unguium: Secondary | ICD-10-CM | POA: Diagnosis not present

## 2023-10-18 DIAGNOSIS — E1151 Type 2 diabetes mellitus with diabetic peripheral angiopathy without gangrene: Secondary | ICD-10-CM | POA: Diagnosis not present

## 2023-10-18 DIAGNOSIS — L84 Corns and callosities: Secondary | ICD-10-CM | POA: Diagnosis not present

## 2023-10-18 DIAGNOSIS — M79676 Pain in unspecified toe(s): Secondary | ICD-10-CM | POA: Diagnosis not present

## 2023-10-18 NOTE — Patient Instructions (Signed)
Visit Information  Thank you for taking time to visit with me today. Please don't hesitate to contact me if I can be of assistance to you before our next scheduled telephone appointment.  Following are the goals we discussed today:   Goals Addressed             This Visit's Progress    CCM (DIABETES) EXPECTED OUTCOME: MONITOR, SELF-MANAGE AND REDUCE SYMPTOMS OF DIABETES       Current Barriers:  Knowledge Deficits related to management of Diabetes Chronic Disease Management support and education needs related to Diabetes management, diet Patient reports she checks CBG once daily fasting with ranges 100-200's range (with all under 249)with today's reading 193, pt verbalizes understanding that recent AIC is 10.4 on 09/27/23 and working towards goal of <7 Patient states she tries to follow a special diet, drinks tea with splenda, sugar free diet soda Patient reports she continues trying to walk when she can Patient reports she is taking glipizide and farxiga and has all medications on hand  Planned Interventions: Reviewed medications with patient and discussed importance of medication adherence;        Reviewed prescribed diet with patient carbohydrate modified, gave examples of food choices that will elevate blood sugar; Counseled on importance of regular laboratory monitoring as prescribed;        Advised patient, providing education and rationale, to check cbg once daily and record        call provider for findings outside established parameters;       Review of patient status, including review of consultants reports, relevant laboratory and other test results, and medications completed;       Advised patient to discuss issues with blood sugar, medications with provider;      Reviewed importance of daily consistent exercise, continue walking Reviewed examples of foods high in carbohydrates/ starchy foods that will elevate blood sugar  Symptom Management: Take medications as prescribed    Attend all scheduled provider appointments Call pharmacy for medication refills 3-7 days in advance of running out of medications Attend church or other social activities Perform all self care activities independently  Perform IADL's (shopping, preparing meals, housekeeping, managing finances) independently Call provider office for new concerns or questions  check blood sugar at prescribed times: once daily check feet daily for cuts, sores or redness enter blood sugar readings and medication or insulin into daily log take the blood sugar log to all doctor visits take the blood sugar meter to all doctor visits trim toenails straight across drink 6 to 8 glasses of water each day eat fish at least once per week fill half of plate with vegetables limit fast food meals to no more than 1 per week manage portion size prepare main meal at home 3 to 5 days each week read food labels for fat, fiber, carbohydrates and portion size set a realistic goal wash and dry feet carefully every day Too much potatoes, rice, pasta, bread at one meal with cause your blood sugar to go up Avoid concentrated sweets Choose water to drink as much as possible Continue walking- keep up the good work! Continue working towards goal of [facility] <7  Follow Up Plan: Telephone follow up appointment with care management team member scheduled for:   12/16/23 at 9 am       CCM (HYPERTENSION)  EXPECTED OUTCOME:  MONITOR,SELF- MANAGE AND REDUCE SYMPTOMS OF HYPERTENSION       Current Barriers:  Knowledge Deficits related to Hypertension Care  Coordination needs related to obtaining a blood pressure cuff in a patient with hypertension Chronic Disease Management support and education needs related to importance of monitoring blood pressure, adherence to low sodium diet and exercise Patient lives with her sister, is independent with ADL, IADL's, uses [place] [employer] for transportation, patient states she continues checking blood  pressure few times per week with readings within normal limits Patient reports she continues to be mindful of sodium intake  Planned Interventions: Evaluation of current treatment plan related to hypertension self management and patient's adherence to plan as established by provider;   Reviewed medications with patient and discussed importance of compliance;  Counseled on the importance of exercise goals with target of 150 minutes per week Advised patient, providing education and rationale, to monitor blood pressure daily and record, calling PCP for findings outside established parameters;  Advised patient to discuss any issues with blood pressure with provider; Discussed complications of poorly controlled blood pressure such as heart disease, stroke, circulatory complications, vision complications, kidney impairment, sexual dysfunction;  Reviewed carbohydrate modified diet Reviewed all upcoming scheduled appointments  Symptom Management: Take medications as prescribed   Attend all scheduled provider appointments Call pharmacy for medication refills 3-7 days in advance of running out of medications Attend church or other social activities Perform all self care activities independently  Perform IADL's (shopping, preparing meals, housekeeping, managing finances) independently Call provider office for new concerns or questions  check blood pressure weekly choose a place to take my blood pressure (home, clinic or office, retail store) write blood pressure results in a log or diary learn about high blood pressure keep a blood pressure log take blood pressure log to all doctor appointments call doctor for signs and symptoms of high blood pressure develop an action plan for high blood pressure keep all doctor appointments take medications for blood pressure exactly as prescribed report new symptoms to your doctor eat more whole grains, fruits and vegetables, lean meats and healthy  fats Continue to check your blood pressure, keep a log, take the log to the doctor with you Continue to follow a low sodium diet Avoid salty snacks, fast food Read food labels for sodium content  Follow Up Plan: Telephone follow up appointment with care management team member scheduled for:   12/16/23 at 9 am           Our next appointment is by telephone on 12/16/23 at 9 am  Please call the care guide team at [phone] if you need to cancel or reschedule your appointment.   If you are experiencing a Mental Health or Behavioral Health Crisis or need someone to talk to, please call the Suicide and Crisis Lifeline: 988 call the [place] National Suicide Prevention Lifeline: 845-570-7750 or TTY: 203-484-1564 TTY 857-184-7506) to talk to a trained counselor call 1-800-273-TALK (toll free, 24 hour hotline) go to [facility] [address], [place] [phone]) call the [facility] Crisis Line: [phone] call 911   Patient verbalizes understanding of instructions and care plan provided today and agrees to view in MyChart. Active MyChart status and patient understanding of how to access instructions and care plan via MyChart confirmed with patient.     Telephone follow up appointment with care management team member scheduled for: 12/16/23 at 9 am  Irving Shows [facility], BSN [facility]/ Ambulatory Care Management [phone]

## 2023-10-18 NOTE — Patient Outreach (Signed)
Care Management   Visit Note  10/18/2023 Name: Renee [name] MRN: [id number] DOB: 04-09-1951  Subjective: Renee [name] is a 72 y.o. year old female who is a primary care patient of Junie Spencer, FNP. The Care Management team was consulted for assistance.      Engaged with patient spoke with patient by telephone for follow up   Goals Addressed             This Visit's Progress    CCM (DIABETES) EXPECTED OUTCOME: MONITOR, SELF-MANAGE AND REDUCE SYMPTOMS OF DIABETES       Current Barriers:  Knowledge Deficits related to management of Diabetes Chronic Disease Management support and education needs related to Diabetes management, diet Patient reports she checks CBG once daily fasting with ranges 100-200's range (with all under 249)with today's reading 193, pt verbalizes understanding that recent AIC is 10.4 on 09/27/23 and working towards goal of <7 Patient states she tries to follow a special diet, drinks tea with splenda, sugar free diet soda Patient reports she continues trying to walk when she can Patient reports she is taking glipizide and farxiga and has all medications on hand  Planned Interventions: Reviewed medications with patient and discussed importance of medication adherence;        Reviewed prescribed diet with patient carbohydrate modified, gave examples of food choices that will elevate blood sugar; Counseled on importance of regular laboratory monitoring as prescribed;        Advised patient, providing education and rationale, to check cbg once daily and record        call provider for findings outside established parameters;       Review of patient status, including review of consultants reports, relevant laboratory and other test results, and medications completed;       Advised patient to discuss issues with blood sugar, medications with provider;      Reviewed importance of daily consistent exercise, continue walking Reviewed examples of foods high in  carbohydrates/ starchy foods that will elevate blood sugar  Symptom Management: Take medications as prescribed   Attend all scheduled provider appointments Call pharmacy for medication refills 3-7 days in advance of running out of medications Attend church or other social activities Perform all self care activities independently  Perform IADL's (shopping, preparing meals, housekeeping, managing finances) independently Call provider office for new concerns or questions  check blood sugar at prescribed times: once daily check feet daily for cuts, sores or redness enter blood sugar readings and medication or insulin into daily log take the blood sugar log to all doctor visits take the blood sugar meter to all doctor visits trim toenails straight across drink 6 to 8 glasses of water each day eat fish at least once per week fill half of plate with vegetables limit fast food meals to no more than 1 per week manage portion size prepare main meal at home 3 to 5 days each week read food labels for fat, fiber, carbohydrates and portion size set a realistic goal wash and dry feet carefully every day Too much potatoes, rice, pasta, bread at one meal with cause your blood sugar to go up Avoid concentrated sweets Choose water to drink as much as possible Continue walking- keep up the good work! Continue working towards goal of [facility] <7  Follow Up Plan: Telephone follow up appointment with care management team member scheduled for:   12/16/23 at 9 am       CCM (HYPERTENSION)  EXPECTED OUTCOME:  MONITOR,SELF- MANAGE AND REDUCE SYMPTOMS OF HYPERTENSION       Current Barriers:  Knowledge Deficits related to Hypertension Care Coordination needs related to obtaining a blood pressure cuff in a patient with hypertension Chronic Disease Management support and education needs related to importance of monitoring blood pressure, adherence to low sodium diet and exercise Patient lives with her sister, is  independent with ADL, IADL's, uses [place] [employer] for transportation, patient states she continues checking blood pressure few times per week with readings within normal limits Patient reports she continues to be mindful of sodium intake  Planned Interventions: Evaluation of current treatment plan related to hypertension self management and patient's adherence to plan as established by provider;   Reviewed medications with patient and discussed importance of compliance;  Counseled on the importance of exercise goals with target of 150 minutes per week Advised patient, providing education and rationale, to monitor blood pressure daily and record, calling PCP for findings outside established parameters;  Advised patient to discuss any issues with blood pressure with provider; Discussed complications of poorly controlled blood pressure such as heart disease, stroke, circulatory complications, vision complications, kidney impairment, sexual dysfunction;  Reviewed carbohydrate modified diet Reviewed all upcoming scheduled appointments  Symptom Management: Take medications as prescribed   Attend all scheduled provider appointments Call pharmacy for medication refills 3-7 days in advance of running out of medications Attend church or other social activities Perform all self care activities independently  Perform IADL's (shopping, preparing meals, housekeeping, managing finances) independently Call provider office for new concerns or questions  check blood pressure weekly choose a place to take my blood pressure (home, clinic or office, retail store) write blood pressure results in a log or diary learn about high blood pressure keep a blood pressure log take blood pressure log to all doctor appointments call doctor for signs and symptoms of high blood pressure develop an action plan for high blood pressure keep all doctor appointments take medications for blood pressure exactly as  prescribed report new symptoms to your doctor eat more whole grains, fruits and vegetables, lean meats and healthy fats Continue to check your blood pressure, keep a log, take the log to the doctor with you Continue to follow a low sodium diet Avoid salty snacks, fast food Read food labels for sodium content  Follow Up Plan: Telephone follow up appointment with care management team member scheduled for:   12/16/23 at 9 am           Plan: Telephone follow up appointment with care management team member scheduled for: 12/16/23 at 9 am  Irving Shows [facility], BSN / Ambulatory Care Management [phone]

## 2023-10-24 ENCOUNTER — Other Ambulatory Visit: Payer: Medicare Other | Admitting: *Deleted

## 2023-11-07 ENCOUNTER — Other Ambulatory Visit: Payer: Self-pay | Admitting: Family

## 2023-11-07 DIAGNOSIS — E1159 Type 2 diabetes mellitus with other circulatory complications: Secondary | ICD-10-CM

## 2023-11-07 DIAGNOSIS — E1142 Type 2 diabetes mellitus with diabetic polyneuropathy: Secondary | ICD-10-CM

## 2023-11-08 NOTE — Progress Notes (Unsigned)
Received fax that AZ&Me is missing an active Rx to fill for the patient.   Nils Pyle, PharmD PGY1 Pharmacy Resident

## 2023-11-09 ENCOUNTER — Telehealth: Payer: Self-pay | Admitting: Pharmacist

## 2023-11-09 DIAGNOSIS — E1142 Type 2 diabetes mellitus with diabetic polyneuropathy: Secondary | ICD-10-CM

## 2023-11-09 MED ORDER — DAPAGLIFLOZIN PROPANEDIOL 10 MG PO TABS: 10.0000 mg | ORAL_TABLET | Freq: Every day | ORAL | 4 refills | Status: DC

## 2023-11-09 NOTE — Telephone Encounter (Signed)
   Patient enrolled in the AZ&me patient assistance program for [place].  Updated RX escribed to [facility] mail order (pharmacy for AZ&me patient assistance).  Patient is stable on current regimen.  She will need to re-enroll for 2025. PAP paperwork to be mailed to patient's home.  Forwarding encounter to CPhT to ensure PAP mailed to home.  Patient has not read her my chart message  Kieth Brightly, PharmD, BCACP, CPP Clinical Pharmacist, [facility]

## 2023-11-16 ENCOUNTER — Telehealth: Payer: Self-pay

## 2023-11-16 NOTE — Patient Outreach (Signed)
Attempted to contact patient regarding colorectal, urine micro. Left voicemail for patient to return my call at [phone].  Nicholes Rough, CMA Care Guide [employer]

## 2023-11-22 ENCOUNTER — Telehealth: Payer: Self-pay

## 2023-11-22 DIAGNOSIS — E1142 Type 2 diabetes mellitus with diabetic polyneuropathy: Secondary | ICD-10-CM

## 2023-11-22 NOTE — Telephone Encounter (Signed)
Mailed az&me renewal application for patients [place] assistance.

## 2023-11-22 NOTE — Telephone Encounter (Signed)
Mailed application to pt's home.   New encounter started.

## 2023-12-13 NOTE — Progress Notes (Unsigned)
Pharmacy Medication Assistance Program Note    12/13/2023  Patient ID: Renee [name], female   DOB: 1951/02/11, 72 y.o.   MRN: [id number]     11/22/2023  Outreach [employer]  Manufacturer Medication One Nurse, adult Drugs Farxiga  Dose of Farxiga 10mg   Type of Assistance Manufacturer Assistance  Date Application Sent to Patient 11/22/2023  Date Application Received From Patient 12/13/2023  Application Items Received From Patient Application  Date Application Submitted to Manufacturer 12/13/2023  Method Application Sent to Manufacturer Fax     Please send new 90 day RX to [facility] pharmacy for patient renewal. Thanks!

## 2023-12-14 MED ORDER — DAPAGLIFLOZIN PROPANEDIOL 10 MG PO TABS: 10.0000 mg | ORAL_TABLET | Freq: Every day | ORAL | 4 refills | Status: DC

## 2023-12-16 ENCOUNTER — Other Ambulatory Visit: Payer: Medicare Other

## 2023-12-27 DIAGNOSIS — B351 Tinea unguium: Secondary | ICD-10-CM | POA: Diagnosis not present

## 2023-12-27 DIAGNOSIS — M79676 Pain in unspecified toe(s): Secondary | ICD-10-CM | POA: Diagnosis not present

## 2023-12-27 DIAGNOSIS — E1151 Type 2 diabetes mellitus with diabetic peripheral angiopathy without gangrene: Secondary | ICD-10-CM | POA: Diagnosis not present

## 2023-12-27 DIAGNOSIS — L84 Corns and callosities: Secondary | ICD-10-CM | POA: Diagnosis not present

## 2023-12-29 ENCOUNTER — Ambulatory Visit: Payer: Medicare Other | Admitting: Family

## 2024-01-03 ENCOUNTER — Other Ambulatory Visit: Payer: Self-pay | Admitting: *Deleted

## 2024-01-03 ENCOUNTER — Telehealth: Payer: Self-pay | Admitting: *Deleted

## 2024-01-03 NOTE — Patient Outreach (Signed)
 Care Management   Visit Note  01/03/2024 Name: MARRA FRAGA MRN: [id number] DOB: 1951-06-30  Subjective: DESERE GWIN is a 73 y.o. year old female who is a primary care patient of Lavell Bari LABOR, FNP. The Care Management team was consulted for assistance.      Engaged with patient spoke with patient by telephone.    Goals Addressed             This Visit's Progress    COMPLETED: CCM (DIABETES) EXPECTED OUTCOME: MONITOR, SELF-MANAGE AND REDUCE SYMPTOMS OF DIABETES       Current Barriers:  Knowledge Deficits related to management of Diabetes Chronic Disease Management support and education needs related to Diabetes management, diet Patient reports she checks CBG once daily fasting with ranges 100-200's range (with all under 249)with today's reading 193, pt verbalizes understanding that recent AIC is 10.4 on 09/27/23 and working towards goal of <7 Patient states she tries to follow a special diet, drinks tea with splenda, sugar free diet soda Patient reports she continues trying to walk when she can Patient reports she is taking glipizide  and farxiga  and has all medications on hand  Planned Interventions: Reviewed medications with patient and discussed importance of medication adherence;        Reviewed prescribed diet with patient carbohydrate modified, gave examples of food choices that will elevate blood sugar; Counseled on importance of regular laboratory monitoring as prescribed;        Advised patient, providing education and rationale, to check cbg once daily and record        call provider for findings outside established parameters;       Review of patient status, including review of consultants reports, relevant laboratory and other test results, and medications completed;       Advised patient to discuss issues with blood sugar, medications with provider;      Reviewed importance of daily consistent exercise, continue walking Reviewed examples of foods high in  carbohydrates/ starchy foods that will elevate blood sugar  Symptom Management: Take medications as prescribed   Attend all scheduled provider appointments Call pharmacy for medication refills 3-7 days in advance of running out of medications Attend church or other social activities Perform all self care activities independently  Perform IADL's (shopping, preparing meals, housekeeping, managing finances) independently Call provider office for new concerns or questions  check blood sugar at prescribed times: once daily check feet daily for cuts, sores or redness enter blood sugar readings and medication or insulin  into daily log take the blood sugar log to all doctor visits take the blood sugar meter to all doctor visits trim toenails straight across drink 6 to 8 glasses of water each day eat fish at least once per week fill half of plate with vegetables limit fast food meals to no more than 1 per week manage portion size prepare main meal at home 3 to 5 days each week read food labels for fat, fiber, carbohydrates and portion size set a realistic goal wash and dry feet carefully every day Too much potatoes, rice, pasta, bread at one meal with cause your blood sugar to go up Avoid concentrated sweets Choose water to drink as much as possible Continue walking- keep up the good work! Continue working towards goal of [facility] <7  Follow Up Plan: Telephone follow up appointment with care management team member scheduled for:   12/16/23 at 9 am       COMPLETED: CCM (HYPERTENSION)  EXPECTED OUTCOME:  MONITOR,SELF- MANAGE AND REDUCE SYMPTOMS OF HYPERTENSION       Current Barriers:  Knowledge Deficits related to Hypertension Care Coordination needs related to obtaining a blood pressure cuff in a patient with hypertension Chronic Disease Management support and education needs related to importance of monitoring blood pressure, adherence to low sodium diet and exercise Patient lives with her  sister, is independent with ADL, IADL's, [place] [employer] [place] transportation, patient states she continues checking blood pressure few times per week with readings within normal limits Patient reports she continues to be mindful of sodium intake  Planned Interventions: Evaluation of current treatment plan related to hypertension self management and patient's adherence to plan as established by provider;   Reviewed medications with patient and discussed importance of compliance;  Counseled on the importance of exercise goals with target of 150 minutes per week Advised patient, providing education and rationale, to monitor blood pressure daily and record, calling PCP for findings outside established parameters;  Advised patient to discuss any issues with blood pressure with provider; Discussed complications of poorly controlled blood pressure such as heart disease, stroke, circulatory complications, vision complications, kidney impairment, sexual dysfunction;  Reviewed carbohydrate modified diet Reviewed all upcoming scheduled appointments  Symptom Management: Take medications as prescribed   Attend all scheduled provider appointments Call pharmacy for medication refills 3-7 days in advance of running out of medications Attend church or other social activities Perform all self care activities independently  Perform IADL's (shopping, preparing meals, housekeeping, managing finances) independently Call provider office for new concerns or questions  check blood pressure weekly choose a place to take my blood pressure (home, clinic or office, retail store) write blood pressure results in a log or diary learn about high blood pressure keep a blood pressure log take blood pressure log to all doctor appointments call doctor for signs and symptoms of high blood pressure develop an action plan for high blood pressure keep all doctor appointments take medications for blood pressure exactly as  prescribed report new symptoms to your doctor eat more whole grains, fruits and vegetables, lean meats and healthy fats Continue to check your blood pressure, keep a log, take the log to the doctor with you Continue to follow a low sodium diet Avoid salty snacks, fast food Read food labels for sodium content  Follow Up Plan: Telephone follow up appointment with care management team member scheduled for:   12/16/23 at 9 am       RNCM Care Management Expected Outcomes: Monitor, Self-Manage and Reduce Symptoms of: DM, HTN       Current Barriers:  Knowledge Deficits related to plan of care for management of HTN and DMII  Chronic Disease Management support and education needs related to HTN and DMII   RNCM Clinical Goal(s):  Patient will verbalize basic understanding of  HTN and DMII disease process and self health management plan as evidenced by verbal explanation, recognizing symptoms, lifestyle modifications take all medications exactly as prescribed and will call provider for medication related questions as evidenced by compliance with all medications attend all scheduled medical appointments: with primary care provider and specialist as evidenced by keeping all scheduled appointments demonstrate Improved and Ongoing adherence to prescribed treatment plan for HTN and DMII as evidenced by consistent medication compliance, symptom monitoring and continued lifestyle modifications continue to work with RN Care Manager to address care management and care coordination needs related to  HTN and DMII as evidenced by adherence to CM Team Scheduled appointments through collaboration  with RN Care manager, provider, and care team.   Interventions: Evaluation of current treatment plan related to  self management and patient's adherence to plan as established by provider   Diabetes Interventions:  (Status:  Goal on track:  NO.) Long Term Goal Assessed patient's understanding of A1c goal: <7% Provided  education to patient about basic DM disease process. RNCM provided verbal education to patient regarding checking fasting blood glucose daily. RNCM reinforced that blood sugar should be checked prior to any meals/snack and recorded. RNCM reviewed with patient the fasting goal <130 and post prandial <180. Patient verbalized full understanding and states she was following the direction of the hospital as to when/how to check her fasting glucose.  Reviewed medications with patient and discussed importance of medication adherence. Reports compliance with Farxiga  and Glipizide . She states she is unable to use insulin  because it has adverse effects on her vision. Counseled on importance of regular laboratory monitoring as prescribed Discussed plans with patient for ongoing care management follow up and provided patient with direct contact information for care management team Provided patient with written educational materials related to hypo and hyperglycemia and importance of correct treatment Reviewed scheduled/upcoming provider appointments including: 01-17-2024 with PCP Advised patient, providing education and rationale, to check cbg at least once daily or per provider recommendations and record, calling provider for findings outside established parameters. She reports glucose of 221 today.  Review of patient status, including review of consultants reports, relevant laboratory and other test results, and medications completed Screening for signs and symptoms of depression related to chronic disease state  Assessed social determinant of health barriers Lab Results  Component Value Date   HGBA1C 10.4 (H) 09/27/2023    Hypertension Interventions:  (Status:  Goal on track:  Yes.) Long Term Goal Last practice recorded BP readings:  BP Readings from Last 3 Encounters:  09/27/23 134/78  06/27/23 106/65  03/24/23 125/72   Most recent eGFR/CrCl:  Lab Results  Component Value Date   EGFR 41 (L)  09/27/2023    No components found for: CRCL  Evaluation of current treatment plan related to hypertension self management and patient's adherence to plan as established by provider. Regularly checks blood pressure and self reported BP today of 111/65 HR 72.  Provided education to patient re: stroke prevention, s/s of heart attack and stroke Reviewed medications with patient and discussed importance of compliance. Reports compliance with all medications Counseled on adverse effects of illicit drug and excessive alcohol use in patients with high blood pressure. Denies illicit drug or alcohol use.  Counseled on the importance of exercise goals with target of 150 minutes per week Discussed plans with patient for ongoing care management follow up and provided patient with direct contact information for care management team Advised patient, providing education and rationale, to monitor blood pressure daily and record, calling PCP for findings outside established parameters. Reviewed scheduled/upcoming provider appointments including: 01-17-2024 with PCP Advised patient to discuss any new changes with provider Provided education on prescribed diet ADA/Heart Healthy Discussed complications of poorly controlled blood pressure such as heart disease, stroke, circulatory complications, vision complications, kidney impairment, sexual dysfunction Screening for signs and symptoms of depression related to chronic disease state  Assessed social determinant of health barriers  Patient Goals/Self-Care Activities: Take all medications as prescribed Attend all scheduled provider appointments Call pharmacy for medication refills 3-7 days in advance of running out of medications Attend church or other social activities Perform all self care activities independently  Perform  IADL's (shopping, preparing meals, housekeeping, managing finances) independently Call provider office for new concerns or questions   schedule appointment with eye doctor check blood sugar at prescribed times: once daily check feet daily for cuts, sores or redness take the blood sugar log to all doctor visits check blood pressure weekly write blood pressure results in a log or diary take blood pressure log to all doctor appointments  Follow Up Plan:  Telephone follow up appointment with care management team member scheduled for:  02-02-2024 at 1:00 pm            Consent to Services:  Patient was given information about care management services, agreed to services, and gave verbal consent to participate.   Plan: Telephone follow up appointment with care management team member scheduled for:02-02-2024 at 1:00 pm  Rosina Forte, BSN RN RN Care Manager  [facility]  Ambulatory Care Management  Direct Number: [phone]

## 2024-01-03 NOTE — Patient Outreach (Signed)
  Care Management   Follow Up Note   01/03/2024 Name: Renee [name] MRN: [id number] DOB: 09-Jul-1951   Referred by: Lavell Bari LABOR, FNP Reason for referral : Care Management (RNCM: ATTEMPT #1 Follow Up For Chronic Disease Management & Care Coordination Needs)   An unsuccessful telephone outreach was attempted today. The patient was referred to the case management team for assistance with care management and care coordination.   Follow Up Plan: The care management team will reach out to the patient again over the next 30 days.   Rosina Forte, BSN RN RN Care Manager  [place]  Ambulatory Care Management  Direct Number: [phone]

## 2024-01-03 NOTE — Patient Instructions (Signed)
 Visit Information  Thank you for taking time to visit with me today. Please don't hesitate to contact me if I can be of assistance to you before our next scheduled telephone appointment.  Following are the goals we discussed today:   Goals Addressed             This Visit's Progress    COMPLETED: CCM (DIABETES) EXPECTED OUTCOME: MONITOR, SELF-MANAGE AND REDUCE SYMPTOMS OF DIABETES       Current Barriers:  Knowledge Deficits related to management of Diabetes Chronic Disease Management support and education needs related to Diabetes management, diet Patient reports she checks CBG once daily fasting with ranges 100-200's range (with all under 249)with today's reading 193, pt verbalizes understanding that recent AIC is 10.4 on 09/27/23 and working towards goal of <7 Patient states she tries to follow a special diet, drinks tea with splenda, sugar free diet soda Patient reports she continues trying to walk when she can Patient reports she is taking glipizide  and farxiga  and has all medications on hand  Planned Interventions: Reviewed medications with patient and discussed importance of medication adherence;        Reviewed prescribed diet with patient carbohydrate modified, gave examples of food choices that will elevate blood sugar; Counseled on importance of regular laboratory monitoring as prescribed;        Advised patient, providing education and rationale, to check cbg once daily and record        call provider for findings outside established parameters;       Review of patient status, including review of consultants reports, relevant laboratory and other test results, and medications completed;       Advised patient to discuss issues with blood sugar, medications with provider;      Reviewed importance of daily consistent exercise, continue walking Reviewed examples of foods high in carbohydrates/ starchy foods that will elevate blood sugar  Symptom Management: Take medications as  prescribed   Attend all scheduled provider appointments Call pharmacy for medication refills 3-7 days in advance of running out of medications Attend church or other social activities Perform all self care activities independently  Perform IADL's (shopping, preparing meals, housekeeping, managing finances) independently Call provider office for new concerns or questions  check blood sugar at prescribed times: once daily check feet daily for cuts, sores or redness enter blood sugar readings and medication or insulin  into daily log take the blood sugar log to all doctor visits take the blood sugar meter to all doctor visits trim toenails straight across drink 6 to 8 glasses of water each day eat fish at least once per week fill half of plate with vegetables limit fast food meals to no more than 1 per week manage portion size prepare main meal at home 3 to 5 days each week read food labels for fat, fiber, carbohydrates and portion size set a realistic goal wash and dry feet carefully every day Too much potatoes, rice, pasta, bread at one meal with cause your blood sugar to go up Avoid concentrated sweets Choose water to drink as much as possible Continue walking- keep up the good work! Continue working towards goal of [facility] <7  Follow Up Plan: Telephone follow up appointment with care management team member scheduled for:   12/16/23 at 9 am       COMPLETED: CCM (HYPERTENSION)  EXPECTED OUTCOME:  MONITOR,SELF- MANAGE AND REDUCE SYMPTOMS OF HYPERTENSION       Current Barriers:  Knowledge Deficits related to  Hypertension Care Coordination needs related to obtaining a blood pressure cuff in a patient with hypertension Chronic Disease Management support and education needs related to importance of monitoring blood pressure, adherence to low sodium diet and exercise Patient lives with her sister, is independent with ADL, IADL's, [place] [employer] [place] for transportation, patient states she  continues checking blood pressure few times per week with readings within normal limits Patient reports she continues to be mindful of sodium intake  Planned Interventions: Evaluation of current treatment plan related to hypertension self management and patient's adherence to plan as established by provider;   Reviewed medications with patient and discussed importance of compliance;  Counseled on the importance of exercise goals with target of 150 minutes per week Advised patient, providing education and rationale, to monitor blood pressure daily and record, calling PCP for findings outside established parameters;  Advised patient to discuss any issues with blood pressure with provider; Discussed complications of poorly controlled blood pressure such as heart disease, stroke, circulatory complications, vision complications, kidney impairment, sexual dysfunction;  Reviewed carbohydrate modified diet Reviewed all upcoming scheduled appointments  Symptom Management: Take medications as prescribed   Attend all scheduled provider appointments Call pharmacy for medication refills 3-7 days in advance of running out of medications Attend church or other social activities Perform all self care activities independently  Perform IADL's (shopping, preparing meals, housekeeping, managing finances) independently Call provider office for new concerns or questions  check blood pressure weekly choose a place to take my blood pressure (home, clinic or office, retail store) write blood pressure results in a log or diary learn about high blood pressure keep a blood pressure log take blood pressure log to all doctor appointments call doctor for signs and symptoms of high blood pressure develop an action plan for high blood pressure keep all doctor appointments take medications for blood pressure exactly as prescribed report new symptoms to your doctor eat more whole grains, fruits and vegetables, lean  meats and healthy fats Continue to check your blood pressure, keep a log, take the log to the doctor with you Continue to follow a low sodium diet Avoid salty snacks, fast food Read food labels for sodium content  Follow Up Plan: Telephone follow up appointment with care management team member scheduled for:   12/16/23 at 9 am       RNCM Care Management Expected Outcomes: Monitor, Self-Manage and Reduce Symptoms of: DM, HTN       Current Barriers:  Knowledge Deficits related to plan of care for management of HTN and DMII  Chronic Disease Management support and education needs related to HTN and DMII   RNCM Clinical Goal(s):  Patient will verbalize basic understanding of  HTN and DMII disease process and self health management plan as evidenced by verbal explanation, recognizing symptoms, lifestyle modifications take all medications exactly as prescribed and will call provider for medication related questions as evidenced by compliance with all medications attend all scheduled medical appointments: with primary care provider and specialist as evidenced by keeping all scheduled appointments demonstrate Improved and Ongoing adherence to prescribed treatment plan for HTN and DMII as evidenced by consistent medication compliance, symptom monitoring and continued lifestyle modifications continue to work with RN Care Manager to address care management and care coordination needs related to  HTN and DMII as evidenced by adherence to CM Team Scheduled appointments through collaboration with RN Care manager, provider, and care team.   Interventions: Evaluation of current treatment plan related to  self  management and patient's adherence to plan as established by provider   Diabetes Interventions:  (Status:  Goal on track:  NO.) Long Term Goal Assessed patient's understanding of A1c goal: <7% Provided education to patient about basic DM disease process. RNCM provided verbal education to patient  regarding checking fasting blood glucose daily. RNCM reinforced that blood sugar should be checked prior to any meals/snack and recorded. RNCM reviewed with patient the fasting goal <130 and post prandial <180. Patient verbalized full understanding and states she was following the direction of the hospital as to when/how to check her fasting glucose.  Reviewed medications with patient and discussed importance of medication adherence. Reports compliance with Farxiga  and Glipizide . She states she is unable to use insulin  because it has adverse effects on her vision. Counseled on importance of regular laboratory monitoring as prescribed Discussed plans with patient for ongoing care management follow up and provided patient with direct contact information for care management team Provided patient with written educational materials related to hypo and hyperglycemia and importance of correct treatment Reviewed scheduled/upcoming provider appointments including: 01-17-2024 with PCP Advised patient, providing education and rationale, to check cbg at least once daily or per provider recommendations and record, calling provider for findings outside established parameters. She reports glucose of 221 today.  Review of patient status, including review of consultants reports, relevant laboratory and other test results, and medications completed Screening for signs and symptoms of depression related to chronic disease state  Assessed social determinant of health barriers Lab Results  Component Value Date   HGBA1C 10.4 (H) 09/27/2023    Hypertension Interventions:  (Status:  Goal on track:  Yes.) Long Term Goal Last practice recorded BP readings:  BP Readings from Last 3 Encounters:  09/27/23 134/78  06/27/23 106/65  03/24/23 125/72   Most recent eGFR/CrCl:  Lab Results  Component Value Date   EGFR 41 (L) 09/27/2023    No components found for: CRCL  Evaluation of current treatment plan related to  hypertension self management and patient's adherence to plan as established by provider. Regularly checks blood pressure and self reported BP today of 111/65 HR 72.  Provided education to patient re: stroke prevention, s/s of heart attack and stroke Reviewed medications with patient and discussed importance of compliance. Reports compliance with all medications Counseled on adverse effects of illicit drug and excessive alcohol use in patients with high blood pressure. Denies illicit drug or alcohol use.  Counseled on the importance of exercise goals with target of 150 minutes per week Discussed plans with patient for ongoing care management follow up and provided patient with direct contact information for care management team Advised patient, providing education and rationale, to monitor blood pressure daily and record, calling PCP for findings outside established parameters. Reviewed scheduled/upcoming provider appointments including: 01-17-2024 with PCP Advised patient to discuss any new changes with provider Provided education on prescribed diet ADA/Heart Healthy Discussed complications of poorly controlled blood pressure such as heart disease, stroke, circulatory complications, vision complications, kidney impairment, sexual dysfunction Screening for signs and symptoms of depression related to chronic disease state  Assessed social determinant of health barriers  Patient Goals/Self-Care Activities: Take all medications as prescribed Attend all scheduled provider appointments Call pharmacy for medication refills 3-7 days in advance of running out of medications Attend church or other social activities Perform all self care activities independently  Perform IADL's (shopping, preparing meals, housekeeping, managing finances) independently Call provider office for new concerns or questions  schedule appointment with  eye doctor check blood sugar at prescribed times: once daily check feet daily  for cuts, sores or redness take the blood sugar log to all doctor visits check blood pressure weekly write blood pressure results in a log or diary take blood pressure log to all doctor appointments  Follow Up Plan:  Telephone follow up appointment with care management team member scheduled for:  02-02-2024 at 1:00 pm           Our next appointment is by telephone on 02-02-2024 at 1:00 pm  Please call the care guide team at [phone] if you need to cancel or reschedule your appointment.   If you are experiencing a Mental Health or Behavioral Health Crisis or need someone to talk to, please call the Suicide and Crisis Lifeline: 988 call the [place]  National Suicide Prevention Lifeline: (914)510-9553 or TTY: 315 867 3665 TTY 424-030-7402) to talk to a trained counselor call 1-800-273-TALK (toll free, 24 hour hotline) call 911   Patient verbalizes understanding of instructions and care plan provided today and agrees to view in MyChart. Active MyChart status and patient understanding of how to access instructions and care plan via MyChart confirmed with patient.     Telephone follow up appointment with care management team member scheduled for:02-02-2024 at 1:00 pm  Rosina Forte, BSN RN RN Care Manager  [facility]  Ambulatory Care Management  Direct Number: [phone]

## 2024-01-12 ENCOUNTER — Other Ambulatory Visit: Payer: Self-pay | Admitting: Family

## 2024-01-12 DIAGNOSIS — K2100 Gastro-esophageal reflux disease with esophagitis, without bleeding: Secondary | ICD-10-CM

## 2024-01-17 ENCOUNTER — Encounter: Payer: Self-pay | Admitting: Family

## 2024-01-17 ENCOUNTER — Ambulatory Visit: Payer: Medicare Other | Admitting: Family

## 2024-01-17 VITALS — BP 124/70 | HR 73 | Temp 96.9°F | Ht 66.0 in | Wt 159.8 lb

## 2024-01-17 DIAGNOSIS — Z8673 Personal history of transient ischemic attack (TIA), and cerebral infarction without residual deficits: Secondary | ICD-10-CM | POA: Diagnosis not present

## 2024-01-17 DIAGNOSIS — E1169 Type 2 diabetes mellitus with other specified complication: Secondary | ICD-10-CM

## 2024-01-17 DIAGNOSIS — E1159 Type 2 diabetes mellitus with other circulatory complications: Secondary | ICD-10-CM | POA: Diagnosis not present

## 2024-01-17 DIAGNOSIS — I152 Hypertension secondary to endocrine disorders: Secondary | ICD-10-CM | POA: Diagnosis not present

## 2024-01-17 DIAGNOSIS — Z6825 Body mass index (BMI) 25.0-25.9, adult: Secondary | ICD-10-CM

## 2024-01-17 DIAGNOSIS — E1142 Type 2 diabetes mellitus with diabetic polyneuropathy: Secondary | ICD-10-CM

## 2024-01-17 DIAGNOSIS — E663 Overweight: Secondary | ICD-10-CM

## 2024-01-17 DIAGNOSIS — K2100 Gastro-esophageal reflux disease with esophagitis, without bleeding: Secondary | ICD-10-CM

## 2024-01-17 DIAGNOSIS — Z7984 Long term (current) use of oral hypoglycemic drugs: Secondary | ICD-10-CM

## 2024-01-17 DIAGNOSIS — Z91199 Patient's noncompliance with other medical treatment and regimen due to unspecified reason: Secondary | ICD-10-CM | POA: Diagnosis not present

## 2024-01-17 DIAGNOSIS — E785 Hyperlipidemia, unspecified: Secondary | ICD-10-CM

## 2024-01-17 DIAGNOSIS — Z23 Encounter for immunization: Secondary | ICD-10-CM | POA: Diagnosis not present

## 2024-01-17 LAB — CMP14+EGFR
ALT: 19 [IU]/L (ref 0–32)
AST: 20 [IU]/L (ref 0–40)
Albumin: 4.2 g/dL (ref 3.8–4.8)
Alkaline Phosphatase: 89 [IU]/L (ref 44–121)
BUN/Creatinine Ratio: 25 (ref 12–28)
BUN: 28 mg/dL — ABNORMAL HIGH (ref 8–27)
Bilirubin Total: 0.4 mg/dL (ref 0.0–1.2)
CO2: 22 mmol/L (ref 20–29)
Calcium: 10.1 mg/dL (ref 8.7–10.3)
Chloride: 103 mmol/L (ref 96–106)
Creatinine, Ser: 1.11 mg/dL — ABNORMAL HIGH (ref 0.57–1.00)
Globulin, Total: 3 g/dL (ref 1.5–4.5)
Glucose: 134 mg/dL — ABNORMAL HIGH (ref 70–99)
Potassium: 4.1 mmol/L (ref 3.5–5.2)
Sodium: 140 mmol/L (ref 134–144)
Total Protein: 7.2 g/dL (ref 6.0–8.5)
eGFR: 52 mL/min/{1.73_m2} — ABNORMAL LOW (ref >59)

## 2024-01-17 LAB — BAYER DCA HB A1C WAIVED: HB A1C (BAYER DCA - WAIVED): 10 % — ABNORMAL HIGH (ref 4.8–5.6)

## 2024-01-17 MED ORDER — GABAPENTIN 400 MG PO CAPS: 400.0000 mg | ORAL_CAPSULE | Freq: Three times a day (TID) | ORAL | 0 refills | Status: DC

## 2024-01-17 MED ORDER — DAPAGLIFLOZIN PROPANEDIOL 10 MG PO TABS: 10.0000 mg | ORAL_TABLET | Freq: Every day | ORAL | 4 refills | Status: DC

## 2024-01-17 MED ORDER — GLIPIZIDE 10 MG PO TABS: 10.0000 mg | ORAL_TABLET | Freq: Two times a day (BID) | ORAL | 0 refills | Status: DC

## 2024-01-17 NOTE — Progress Notes (Signed)
Subjective:    Patient ID: Renee [name], female    DOB: 23-Mar-1951, 73 y.o.   MRN: [id number]  Chief Complaint  Patient presents with   Medical Management of Chronic Issues    WANTS SOMETHING FOR PAIN IN HANDS WANTS SOMETHING OTHER THAN MOTRAIN SHE WANTS TYLENOL EXTRA STRENGTH    Pt presents to the office today for chronic follow up.    Her DM is uncontrolled and is followed by Podiatry every 8 weeks. She has seen Endocrinologists, but states she has not seen them in awhile because of transportation. Her last A1C was 10.4.   She refuses any medication changes because she is scared of side effects. She has stopped her Januvia because it was "not working".    She has diabetic neuropathy and takes gabapentin 400 mg TID. States her pain is an aching, burning  pain of 7 out 10 with medications.    Has hx of CVA.  Hypertension This is a chronic problem. The current episode started more than 1 year ago. The problem has been resolved since onset. The problem is controlled. Associated symptoms include blurred vision. Pertinent negatives include no malaise/fatigue, peripheral edema or shortness of breath. Risk factors for coronary artery disease include dyslipidemia, diabetes mellitus and sedentary lifestyle. The current treatment provides moderate improvement. Hypertensive end-organ damage includes CVA.  Gastroesophageal Reflux She complains of belching and heartburn. This is a chronic problem. The current episode started more than 1 year ago. The problem occurs occasionally. She has tried a PPI for the symptoms. The treatment provided moderate relief.  Hyperlipidemia This is a chronic problem. The current episode started more than 1 year ago. The problem is controlled. Recent lipid tests were reviewed and are normal. Exacerbating diseases include obesity. Pertinent negatives include no shortness of breath. Current antihyperlipidemic treatment includes statins. The current treatment provides  moderate improvement of lipids. Risk factors for coronary artery disease include dyslipidemia, diabetes mellitus, hypertension, a sedentary lifestyle and post-menopausal.  Diabetes She presents for her follow-up diabetic visit. She has type 2 diabetes mellitus. Associated symptoms include blurred vision and foot paresthesias. Symptoms are stable. Diabetic complications include a CVA and peripheral neuropathy. Risk factors for coronary artery disease include dyslipidemia, diabetes mellitus, hypertension, sedentary lifestyle and post-menopausal. She is following a generally unhealthy diet. Her overall blood glucose range is 180-200 mg/dl.      Review of Systems  Constitutional:  Negative for malaise/fatigue.  Eyes:  Positive for blurred vision.  Respiratory:  Negative for shortness of breath.   Gastrointestinal:  Positive for heartburn.  All other systems reviewed and are negative.  Family History  Problem Relation Age of Onset   Alzheimer's disease Mother    Parkinson's disease Mother    Heart Problems Father    Kidney disease Father        +dialysis   Heart disease Father    Other Sister        +TAH for fibroids   Breast cancer Sister        dx. 55-56   Renal cancer Sister        dx. 62-63; not a smoker   Early death Brother    Diabetes Brother    Cancer Paternal Aunt        NOS cancer   Colon cancer Neg Hx    Stomach cancer Neg Hx    Social History   Socioeconomic History   Marital status: Single    Spouse name: Not on file  Number of children: Not on file   Years of education: 12   Highest education level: Associate degree: academic program  Occupational History   Occupation: Retired    Associate Professor: [employer]    Comment: [employer]   Tobacco Use   Smoking status: Never   Smokeless tobacco: Never  Vaping Use   Vaping status: Never Used  Substance and Sexual Activity   Alcohol use: Not Currently    Alcohol/week: 1.0 standard  drink of alcohol    Types: 1 Glasses of wine per week   Drug use: No   Sexual activity: Not Currently    Birth control/protection: Post-menopausal  Other Topics Concern   Not on file  Social History Narrative   Retired from school system, single, no children. Enjoys reading and word puzzles.    Jehovah's Witness - goes to [employer] get together [employer] [employer], Saturdays   Her parents left their home to her and her sister   Social Drivers of [employer]: Low Risk  (07/25/2023)   Overall Financial Resource Strain (CARDIA)    Difficulty of Paying Living Expenses: Not hard at all  Food Insecurity: No Food Insecurity (07/25/2023)   Hunger Vital Sign    Worried About Running Out of Food in the Last Year: Never true    Ran Out of Food in the Last Year: Never true  Transportation Needs: No Transportation Needs (07/25/2023)   PRAPARE - Administrator, Civil Service (Medical): No    Lack of Transportation (Non-Medical): No  Physical Activity: Insufficiently Active (07/25/2023)   Exercise Vital Sign    Days of Exercise per Week: 3 days    Minutes of Exercise per Session: 30 min  Stress: No Stress Concern Present (07/25/2023)   [employer] of Occupational Health - Occupational Stress Questionnaire    Feeling of Stress : Not at all  Social Connections: Moderately Isolated (07/25/2023)   Social Connection and Isolation Panel [NHANES]    Frequency of Communication with Friends and Family: More than three times a week    Frequency of Social Gatherings with Friends and Family: More than three times a week    Attends Religious Services: More than 4 times per year    Active Member of [employer] or Organizations: No    Attends Banker Meetings: Never    Marital Status: Never married       Objective:   Physical Exam Vitals reviewed.  Constitutional:      General: She is not in acute distress.    Appearance: She is well-developed.  HENT:      Head: Normocephalic and atraumatic.     Right Ear: Tympanic membrane normal.     Left Ear: Tympanic membrane normal.  Eyes:     Pupils: Pupils are equal, round, and reactive to light.  Neck:     Thyroid: No thyromegaly.  Cardiovascular:     Rate and Rhythm: Normal rate and regular rhythm.     Heart sounds: Normal heart sounds. No murmur heard. Pulmonary:     Effort: Pulmonary effort is normal. No respiratory distress.     Breath sounds: Normal breath sounds. No wheezing.  Abdominal:     General: Bowel sounds are normal. There is no distension.     Palpations: Abdomen is soft.     Tenderness: There is no abdominal tenderness.  Musculoskeletal:        General: No tenderness. Normal  range of motion.     Cervical back: Normal range of motion and neck supple.  Skin:    General: Skin is warm and dry.  Neurological:     Mental Status: She is alert and oriented to person, place, and time.     Cranial Nerves: No cranial nerve deficit.     Deep Tendon Reflexes: Reflexes are normal and symmetric.  Psychiatric:        Behavior: Behavior normal.        Thought Content: Thought content normal.        Judgment: Judgment normal.       BP 124/70   Pulse 73   Temp (!) 96.9 F (36.1 C) (Temporal)   Ht 5\' 6"  (1.676 m)   Wt 159 lb 12.8 oz (72.5 kg)   SpO2 99%   BMI 25.79 kg/m      Assessment & Plan:  Renee [name] comes in today with chief complaint of Medical Management of Chronic Issues (WANTS SOMETHING FOR PAIN IN HANDS WANTS SOMETHING OTHER THAN MOTRAIN SHE WANTS TYLENOL EXTRA STRENGTH )   Diagnosis and orders addressed:  1. Diabetic peripheral neuropathy (HCC) - gabapentin (NEURONTIN) 400 MG capsule; Take 1 capsule (400 mg total) by mouth 3 (three) times daily.  Dispense: 300 capsule; Refill: 0 - dapagliflozin propanediol (FARXIGA) 10 MG TABS tablet; Take 1 tablet (10 mg total) by mouth daily before breakfast.  Dispense: 90 tablet; Refill: 4 - CMP14+EGFR - Bayer DCA Hb A1c  Waived  2. Hypertension associated with diabetes (HCC) - CMP14+EGFR  3. Encounter for immunization - CMP14+EGFR  4. Type 2 diabetes mellitus with diabetic polyneuropathy, without long-term current use of insulin (HCC) (Primary) - glipiZIDE (GLUCOTROL) 10 MG tablet; Take 1 tablet (10 mg total) by mouth 2 (two) times daily before a meal.  Dispense: 200 tablet; Refill: 0 - dapagliflozin propanediol (FARXIGA) 10 MG TABS tablet; Take 1 tablet (10 mg total) by mouth daily before breakfast.  Dispense: 90 tablet; Refill: 4 - CMP14+EGFR - Bayer DCA Hb A1c Waived  5. Hyperlipidemia associated with type 2 diabetes mellitus (HCC) - CMP14+EGFR  6. Gastroesophageal reflux disease with esophagitis, unspecified whether hemorrhage - CMP14+EGFR  7. History of CVA (cerebrovascular accident) - CMP14+EGFR  8. Overweight (BMI 25.0-29.9) - CMP14+EGFR  9. Non-compliance - CMP14+EGFR  Labs pending Strict low carb diet  Continue medications, pt refuses any medication changes at this time.  Health Maintenance reviewed- Refuses all vaccines Diet and exercise encouraged  Follow up plan: 4 months    Jannifer Rodney, FNP

## 2024-01-17 NOTE — Patient Instructions (Signed)

## 2024-02-02 ENCOUNTER — Other Ambulatory Visit: Payer: Self-pay | Admitting: *Deleted

## 2024-02-02 NOTE — Patient Instructions (Signed)
 Visit Information  Thank you for taking time to visit with me today. Please don't hesitate to contact me if I can be of assistance to you before our next scheduled telephone appointment.  Following are the goals we discussed today:   Goals Addressed             This Visit's Progress    RNCM Care Management Expected Outcomes: Monitor, Self-Manage and Reduce Symptoms of: DM, HTN       Current Barriers:  Knowledge Deficits related to plan of care for management of HTN and DMII  Chronic Disease Management support and education needs related to HTN and DMII   RNCM Clinical Goal(s):  Patient will verbalize basic understanding of  HTN and DMII disease process and self health management plan as evidenced by verbal explanation, recognizing symptoms, lifestyle modifications take all medications exactly as prescribed and will call provider for medication related questions as evidenced by compliance with all medications attend all scheduled medical appointments: with primary care provider and specialist as evidenced by keeping all scheduled appointments demonstrate Improved and Ongoing adherence to prescribed treatment plan for HTN and DMII as evidenced by consistent medication compliance, symptom monitoring and continued lifestyle modifications continue to work with RN Care Manager to address care management and care coordination needs related to  HTN and DMII as evidenced by adherence to CM Team Scheduled appointments through collaboration with RN Care manager, provider, and care team.   Interventions: Evaluation of current treatment plan related to  self management and patient's adherence to plan as established by provider   Diabetes Interventions:  (Status:  Goal on track:  Yes.) Long Term Goal Assessed patient's understanding of A1c goal: <7% Provided education to patient about basic DM disease process. Patient with slight decrease in A1C. Patient checks blood sugar daily and records. Denies  any changes at this time. UTD with her vision screening and planning to schedule her visit for 2025.  RNCM provided reinforced education with patient the fasting goal <130 and post prandial <180.  Reviewed medications with patient and discussed importance of medication adherence. Reports compliance with Farxiga  and Glipizide . She states she is unable to use insulin  because it has adverse effects on her vision. Counseled on importance of regular laboratory monitoring as prescribed Discussed plans with patient for ongoing care management follow up and provided patient with direct contact information for care management team Provided patient with written educational materials related to hypo and hyperglycemia and importance of correct treatment Reviewed scheduled/upcoming provider appointments including: 05-24-2024 with PCP Advised patient, providing education and rationale, to check cbg at least once daily or per provider recommendations and record, calling provider for findings outside established parameters. She reports fasting glucose of 192 today.  Review of patient status, including review of consultants reports, relevant laboratory and other test results, and medications completed Screening for signs and symptoms of depression related to chronic disease state  Assessed social determinant of health barriers Lab Results  Component Value Date   HGBA1C 10.0 (H) 01/17/2024    Hypertension Interventions:  (Status:  Goal on track:  Yes.) Long Term Goal Last practice recorded BP readings:  BP Readings from Last 3 Encounters:  01/17/24 124/70  09/27/23 134/78  06/27/23 106/65   Most recent eGFR/CrCl:  Lab Results  Component Value Date   EGFR 52 (L) 01/17/2024    No components found for: CRCL  Evaluation of current treatment plan related to hypertension self management and patient's adherence to plan as established by  provider. Regularly checks blood pressure and self reported BP today of  120/60 HR 75. Denies any chest pain, dizziness or swelling. Provided education to patient re: stroke prevention, s/s of heart attack and stroke. Support and education provided Reviewed medications with patient and discussed importance of compliance. Reports compliance with all medications Counseled on adverse effects of illicit drug and excessive alcohol use in patients with high blood pressure. Denies illicit drug or alcohol use.  Counseled on the importance of exercise goals with target of 150 minutes per week Discussed plans with patient for ongoing care management follow up and provided patient with direct contact information for care management team Advised patient, providing education and rationale, to monitor blood pressure daily and record, calling PCP for findings outside established parameters. Reviewed scheduled/upcoming provider appointments including: 05-24-2024 with PCP Advised patient to discuss any new changes with provider Provided education on prescribed diet low carb Discussed complications of poorly controlled blood pressure such as heart disease, stroke, circulatory complications, vision complications, kidney impairment, sexual dysfunction Screening for signs and symptoms of depression related to chronic disease state  Assessed social determinant of health barriers  Patient Goals/Self-Care Activities: Take all medications as prescribed Attend all scheduled provider appointments Call pharmacy for medication refills 3-7 days in advance of running out of medications Attend church or other social activities Perform all self care activities independently  Perform IADL's (shopping, preparing meals, housekeeping, managing finances) independently Call provider office for new concerns or questions  schedule appointment with eye doctor check blood sugar at prescribed times: once daily check feet daily for cuts, sores or redness take the blood sugar log to all doctor visits check  blood pressure weekly write blood pressure results in a log or diary take blood pressure log to all doctor appointments  Follow Up Plan:  Telephone follow up appointment with care management team member scheduled for:  03-06-2024 at 11:45 am           Our next appointment is by telephone on 03-06-2024 at 11:45 am  Please call the care guide team at [phone] if you need to cancel or reschedule your appointment.   If you are experiencing a Mental Health or Behavioral Health Crisis or need someone to talk to, please call the Suicide and Crisis Lifeline: 988 call the [place]  National Suicide Prevention Lifeline: (873)195-6490 or TTY: 203-602-7288 TTY 815-888-9335) to talk to a trained counselor call 1-800-273-TALK (toll free, 24 hour hotline) call the [facility]: (208) 428-4133   The patient verbalized understanding of instructions, educational materials, and care plan provided today and agreed to receive a mailed copy of patient instructions, educational materials, and care plan.   Telephone follow up appointment with care management team member scheduled for:03-06-2024 at 11:45 am  Rosina Sicard, BSN RN [facility], [facility] RN Care Manager Direct Dial : [phone]  Fax: [phone]    Carbohydrate Counting for Diabetes Mellitus, Adult Carbohydrate counting is a method of keeping track of how many carbohydrates you eat. Eating carbohydrates increases the amount of sugar (glucose) in the blood. Counting how many carbohydrates you eat improves how well you manage your blood glucose. This, in turn, helps you manage your diabetes. Carbohydrates are measured in grams (g) per serving. It is important to know how many carbohydrates (in grams or by serving size) you can have in each meal. This is different for every person. A dietitian can help you make a meal plan and calculate how many carbohydrates you should  have at each meal and  snack. What foods contain carbohydrates? Carbohydrates are found in the following foods: Grains, such as breads and cereals. Dried beans and soy products. Starchy vegetables, such as potatoes, peas, and corn. Fruit and fruit juices. Milk and yogurt. Sweets and snack foods, such as cake, cookies, candy, chips, and soft drinks. How do I count carbohydrates in foods? There are two ways to count carbohydrates in food. You can read food labels or learn standard serving sizes of foods. You can use either of these methods or a combination of both. Using the Nutrition Facts label The Nutrition Facts list is included on the labels of almost all packaged foods and beverages in the [place] . It includes: The serving size. Information about nutrients in each serving, including the grams of carbohydrate per serving. To use the Nutrition Facts, decide how many servings you will have. Then, multiply the number of servings by the number of carbohydrates per serving. The resulting number is the total grams of carbohydrates that you will be having. Learning the standard serving sizes of foods When you eat carbohydrate foods that are not packaged or do not include Nutrition Facts on the label, you need to measure the servings in order to count the grams of carbohydrates. Measure the foods that you will eat with a food scale or measuring cup, if needed. Decide how many standard-size servings you will eat. Multiply the number of servings by 15. For foods that contain carbohydrates, one serving equals 15 g of carbohydrates. For example, if you eat 2 cups or 10 oz (300 g) of strawberries, you will have eaten 2 servings and 30 g of carbohydrates (2 servings x 15 g = 30 g). For foods that have more than one food mixed, such as soups and casseroles, you must count the carbohydrates in each food that is included. The following list contains standard serving sizes of common carbohydrate-rich foods. Each of these  servings has about 15 g of carbohydrates: 1 slice of bread. 1 six-inch (15 cm) tortilla. ? cup or 2 oz (53 g) cooked rice or pasta.  cup or 3 oz (85 g) cooked or canned, drained and rinsed beans or lentils.  cup or 3 oz (85 g) starchy vegetable, such as peas, corn, or squash.  cup or 4 oz (120 g) hot cereal.  cup or 3 oz (85 g) boiled or mashed potatoes, or  or 3 oz (85 g) of a large baked potato.  cup or 4 fl oz (118 mL) fruit juice. 1 cup or 8 fl oz (237 mL) milk. 1 small or 4 oz (106 g) apple.  or 2 oz (63 g) of a medium banana. 1 cup or 5 oz (150 g) strawberries. 3 cups or 1 oz (28.3 g) popped popcorn. What is an example of carbohydrate counting? To calculate the grams of carbohydrates in this sample meal, follow the steps shown below. Sample meal 3 oz (85 g) chicken breast. ? cup or 4 oz (106 g) brown rice.  cup or 3 oz (85 g) corn. 1 cup or 8 fl oz (237 mL) milk. 1 cup or 5 oz (150 g) strawberries with sugar-free whipped topping. Carbohydrate calculation Identify the foods that contain carbohydrates: Rice. Corn. Milk. Strawberries. Calculate how many servings you have of each food: 2 servings rice. 1 serving corn. 1 serving milk. 1 serving strawberries. Multiply each number of servings by 15 g: 2 servings rice x 15 g = 30 g. 1 serving corn x  15 g = 15 g. 1 serving milk x 15 g = 15 g. 1 serving strawberries x 15 g = 15 g. Add together all of the amounts to find the total grams of carbohydrates eaten: 30 g + 15 g + 15 g + 15 g = 75 g of carbohydrates total. What are tips for following this plan? Shopping Develop a meal plan and then make a shopping list. Buy fresh and frozen vegetables, fresh and frozen fruit, dairy, eggs, beans, lentils, and whole grains. Look at food labels. Choose foods that have more fiber and less sugar. Avoid processed foods and foods with added sugars. Meal planning Aim to have the same number of grams of carbohydrates at each meal  and for each snack time. Plan to have regular, balanced meals and snacks. Where to find more information American Diabetes Association: diabetes.org Centers for Disease Control and Prevention: tonerpromos.no Academy of Nutrition and Dietetics: eatright.org Association of Diabetes Care & Education Specialists: diabeteseducator.org Summary Carbohydrate counting is a method of keeping track of how many carbohydrates you eat. Eating carbohydrates increases the amount of sugar (glucose) in your blood. Counting how many carbohydrates you eat improves how well you manage your blood glucose. This helps you manage your diabetes. A dietitian can help you make a meal plan and calculate how many carbohydrates you should have at each meal and snack. This information is not intended to replace advice given to you by your health care provider. Make sure you discuss any questions you have with your health care provider. Document Revised: 07/15/2020 Document Reviewed: 07/16/2020 Elsevier Patient Education  2024 [employer].

## 2024-02-02 NOTE — Patient Outreach (Signed)
 Care Management   Visit Note  02/02/2024 Name: Renee [name] MRN: [id number] DOB: Oct 06, 1951  Subjective: Renee [name] is a 73 y.o. year old female who is a primary care patient of Lavell Bari LABOR, FNP. The Care Management team was consulted for assistance.      Engaged with patient spoke with patient by telephone.    Goals Addressed             This Visit's Progress    RNCM Care Management Expected Outcomes: Monitor, Self-Manage and Reduce Symptoms of: DM, HTN       Current Barriers:  Knowledge Deficits related to plan of care for management of HTN and DMII  Chronic Disease Management support and education needs related to HTN and DMII   RNCM Clinical Goal(s):  Patient will verbalize basic understanding of  HTN and DMII disease process and self health management plan as evidenced by verbal explanation, recognizing symptoms, lifestyle modifications take all medications exactly as prescribed and will call provider for medication related questions as evidenced by compliance with all medications attend all scheduled medical appointments: with primary care provider and specialist as evidenced by keeping all scheduled appointments demonstrate Improved and Ongoing adherence to prescribed treatment plan for HTN and DMII as evidenced by consistent medication compliance, symptom monitoring and continued lifestyle modifications continue to work with RN Care Manager to address care management and care coordination needs related to  HTN and DMII as evidenced by adherence to CM Team Scheduled appointments through collaboration with RN Care manager, provider, and care team.   Interventions: Evaluation of current treatment plan related to  self management and patient's adherence to plan as established by provider   Diabetes Interventions:  (Status:  Goal on track:  Yes.) Long Term Goal Assessed patient's understanding of A1c goal: <7% Provided education to patient about basic DM disease  process. Patient with slight decrease in A1C. Patient checks blood sugar daily and records. Denies any changes at this time. UTD with her vision screening and planning to schedule her visit for 2025.  RNCM provided reinforced education with patient the fasting goal <130 and post prandial <180.  Reviewed medications with patient and discussed importance of medication adherence. Reports compliance with Farxiga  and Glipizide . She states she is unable to use insulin  because it has adverse effects on her vision. Counseled on importance of regular laboratory monitoring as prescribed Discussed plans with patient for ongoing care management follow up and provided patient with direct contact information for care management team Provided patient with written educational materials related to hypo and hyperglycemia and importance of correct treatment Reviewed scheduled/upcoming provider appointments including: 05-24-2024 with PCP Advised patient, providing education and rationale, to check cbg at least once daily or per provider recommendations and record, calling provider for findings outside established parameters. She reports fasting glucose of 192 today.  Review of patient status, including review of consultants reports, relevant laboratory and other test results, and medications completed Screening for signs and symptoms of depression related to chronic disease state  Assessed social determinant of health barriers Lab Results  Component Value Date   HGBA1C 10.0 (H) 01/17/2024    Hypertension Interventions:  (Status:  Goal on track:  Yes.) Long Term Goal Last practice recorded BP readings:  BP Readings from Last 3 Encounters:  01/17/24 124/70  09/27/23 134/78  06/27/23 106/65   Most recent eGFR/CrCl:  Lab Results  Component Value Date   EGFR 52 (L) 01/17/2024    No components found for:  CRCL  Evaluation of current treatment plan related to hypertension self management and patient's adherence to  plan as established by provider. Regularly checks blood pressure and self reported BP today of 120/60 HR 75. Denies any chest pain, dizziness or swelling. Provided education to patient re: stroke prevention, s/s of heart attack and stroke. Support and education provided Reviewed medications with patient and discussed importance of compliance. Reports compliance with all medications Counseled on adverse effects of illicit drug and excessive alcohol use in patients with high blood pressure. Denies illicit drug or alcohol use.  Counseled on the importance of exercise goals with target of 150 minutes per week Discussed plans with patient for ongoing care management follow up and provided patient with direct contact information for care management team Advised patient, providing education and rationale, to monitor blood pressure daily and record, calling PCP for findings outside established parameters. Reviewed scheduled/upcoming provider appointments including: 05-24-2024 with PCP Advised patient to discuss any new changes with provider Provided education on prescribed diet low carb Discussed complications of poorly controlled blood pressure such as heart disease, stroke, circulatory complications, vision complications, kidney impairment, sexual dysfunction Screening for signs and symptoms of depression related to chronic disease state  Assessed social determinant of health barriers  Patient Goals/Self-Care Activities: Take all medications as prescribed Attend all scheduled provider appointments Call pharmacy for medication refills 3-7 days in advance of running out of medications Attend church or other social activities Perform all self care activities independently  Perform IADL's (shopping, preparing meals, housekeeping, managing finances) independently Call provider office for new concerns or questions  schedule appointment with eye doctor check blood sugar at prescribed times: once  daily check feet daily for cuts, sores or redness take the blood sugar log to all doctor visits check blood pressure weekly write blood pressure results in a log or diary take blood pressure log to all doctor appointments  Follow Up Plan:  Telephone follow up appointment with care management team member scheduled for:  03-06-2024 at 11:45 am             Consent to Services:  Patient was given information about care management services, agreed to services, and gave verbal consent to participate.   Plan: Telephone follow up appointment with care management team member scheduled for:03-06-2024 at 11:45 am  Rosina Sicard, BSN RN [facility], [facility] RN Care Manager Direct Dial : [phone]  Fax: [phone]

## 2024-02-08 ENCOUNTER — Ambulatory Visit (INDEPENDENT_AMBULATORY_CARE_PROVIDER_SITE_OTHER): Payer: Medicare Other | Admitting: Family Medicine

## 2024-02-08 ENCOUNTER — Encounter: Payer: Self-pay | Admitting: Family Medicine

## 2024-02-08 ENCOUNTER — Ambulatory Visit (HOSPITAL_COMMUNITY)
Admission: RE | Admit: 2024-02-08 | Discharge: 2024-02-08 | Disposition: A | Discharge status: Home / Self Care | Payer: Medicare Other | Source: Ambulatory Visit | Attending: Family Medicine | Admitting: Family Medicine

## 2024-02-08 VITALS — Temp 97.0°F

## 2024-02-08 DIAGNOSIS — S0990XA Unspecified injury of head, initial encounter: Secondary | ICD-10-CM | POA: Diagnosis not present

## 2024-02-08 DIAGNOSIS — R4020 Unspecified coma: Secondary | ICD-10-CM

## 2024-02-08 DIAGNOSIS — Z8673 Personal history of transient ischemic attack (TIA), and cerebral infarction without residual deficits: Secondary | ICD-10-CM | POA: Insufficient documentation

## 2024-02-08 DIAGNOSIS — R251 Tremor, unspecified: Secondary | ICD-10-CM

## 2024-02-08 DIAGNOSIS — R531 Weakness: Secondary | ICD-10-CM | POA: Diagnosis not present

## 2024-02-08 DIAGNOSIS — R42 Dizziness and giddiness: Secondary | ICD-10-CM | POA: Insufficient documentation

## 2024-02-08 DIAGNOSIS — W19XXXA Unspecified fall, initial encounter: Secondary | ICD-10-CM

## 2024-02-08 DIAGNOSIS — R6889 Other general symptoms and signs: Secondary | ICD-10-CM | POA: Diagnosis not present

## 2024-02-08 NOTE — Progress Notes (Signed)
Subjective:  Patient ID: Renee [name], female    DOB: 11/24/1951, 73 y.o.   MRN: [id number]  Patient Care Team: Junie Spencer, FNP as PCP - General (Nurse Practitioner) Tat, Octaviano Batty, DO as Consulting Physician (Neurology) Pyrtle, Carie Caddy, MD as Consulting Physician (Gastroenterology) Roma Kayser, MD as Consulting Physician (Endocrinology) Adam Phenix, DPM as Consulting Physician (Podiatry) Danella Maiers, [facility] as Pharmacist (Family Medicine) Michaelle Copas, MD as Referring Physician (Optometry) Ricky Stabs, RN as [facility] Care Management (General Practice)   Chief Complaint:  Dizziness (Patient states she has been having dizzy spells since Sunday night.  States she hit her head Sunday on the bath tub when she passed out.  Also having back pain . )  HPI: Renee [name] is a 73 y.o. female presenting on 02/08/2024 for Dizziness (Patient states she has been having dizzy spells since Sunday night.  States she hit her head Sunday on the bath tub when she passed out.  Also having back pain . )  Dizziness  1. History of CVA (cerebrovascular accident)/2. Dizziness/3. Fall  States that Sunday night, 3 days ago she was standing at the sink in the bathroom and she passed out. States that she fell backwards and fell into the bathroom. States that she struggled to get out of the bathroom. Reports that she passed out a second time and laid down on the bathroom floor. States that she then got up and went to the bedroom and rested. Then she was walking to the kitchen and passed out a third time. States that all events were on Sunday night within an hour. She reports that she hit her head. She lives alone and no one witnessed the events. Endorses shakiness and dizziness prior to episodes. States that her left side is swollen and painful. States that she continues to have some slight dizziness and takes her time with transitions.  She has a history of diabetes but did not check her BG  when she fainted.   Relevant past medical, surgical, family, and social history reviewed and updated as indicated.  Allergies and medications reviewed and updated. Data reviewed: Chart in Epic.   Past Medical History:  Diagnosis Date   Arthritis    Breast cancer (HCC) 2001   right mastectomy   CVA (cerebral infarction) 2003, 2005   DM (diabetes mellitus) (HCC)    GERD (gastroesophageal reflux disease)    Heart murmur    Hyperlipidemia    Hypertension    Seizures (HCC)    Vitamin D deficiency     Past Surgical History:  Procedure Laterality Date   ABDOMINAL HYSTERECTOMY  2000   GANGLION CYST EXCISION  2010   right (2010)  and left (before 2010) hand   MASTECTOMY  2001   right   TONSILECTOMY/ADENOIDECTOMY WITH MYRINGOTOMY Bilateral    age 73's    Social History   Socioeconomic History   Marital status: Single    Spouse name: Not on file   Number of children: Not on file   Years of education: 12   Highest education level: Associate degree: academic program  Occupational History   Occupation: Retired    Associate Professor: [employer]    Comment: [employer]   Tobacco Use   Smoking status: Never   Smokeless tobacco: Never  Vaping Use   Vaping status: Never Used  Substance and Sexual Activity   Alcohol use: Not Currently    Alcohol/week:  1.0 standard drink of alcohol    Types: 1 Glasses of wine per week   Drug use: No   Sexual activity: Not Currently    Birth control/protection: Post-menopausal  Other Topics Concern   Not on file  Social History Narrative   Retired from school system, single, no children. Enjoys reading and word puzzles.    Jehovah's Witness - goes to [employer] get together [place] [employer], Saturdays   Her parents left their home to her and her sister   Social Drivers of [employer]: Low Risk  (07/25/2023)   Overall Financial Resource Strain (CARDIA)    Difficulty of Paying Living  Expenses: Not hard at all  Food Insecurity: No Food Insecurity (07/25/2023)   Hunger Vital Sign    Worried About Running Out of Food in the Last Year: Never true    Ran Out of Food in the Last Year: Never true  Transportation Needs: No Transportation Needs (07/25/2023)   PRAPARE - Administrator, Civil Service (Medical): No    Lack of Transportation (Non-Medical): No  Physical Activity: Insufficiently Active (07/25/2023)   Exercise Vital Sign    Days of Exercise per Week: 3 days    Minutes of Exercise per Session: 30 min  Stress: No Stress Concern Present (07/25/2023)   [employer] of Occupational Health - Occupational Stress Questionnaire    Feeling of Stress : Not at all  Social Connections: Moderately Isolated (07/25/2023)   Social Connection and Isolation Panel [NHANES]    Frequency of Communication with Friends and Family: More than three times a week    Frequency of Social Gatherings with Friends and Family: More than three times a week    Attends Religious Services: More than 4 times per year    Active Member of [employer] or Organizations: No    Attends Banker Meetings: Never    Marital Status: Never married  Intimate Partner Violence: Not At Risk (07/25/2023)   Humiliation, Afraid, Rape, and Kick questionnaire    Fear of Current or Ex-Partner: No    Emotionally Abused: No    Physically Abused: No    Sexually Abused: No    Outpatient Encounter Medications as of 02/08/2024  Medication Sig   aspirin 81 MG tablet Take 81 mg by mouth daily.   atorvastatin (LIPITOR) 40 MG tablet Take 1 tablet (40 mg total) by mouth daily.   Blood Glucose Calibration (ONETOUCH VERIO) SOLN Use as needed to calibrate glucometer   Blood Glucose Monitoring Suppl (ONETOUCH VERIO FLEX SYSTEM) w/Device KIT Use to check blood sugar twice daily   Cholecalciferol (VITAMIN D3) 10 MCG (400 UNIT) CAPS Take by mouth.   dapagliflozin propanediol (FARXIGA) 10 MG TABS tablet Take 1 tablet  (10 mg total) by mouth daily before breakfast.   docusate sodium (COLACE) 100 MG capsule Take 100 mg by mouth daily.   gabapentin (NEURONTIN) 400 MG capsule Take 1 capsule (400 mg total) by mouth 3 (three) times daily.   glipiZIDE (GLUCOTROL) 10 MG tablet Take 1 tablet (10 mg total) by mouth 2 (two) times daily before a meal.   glucose blood (ONETOUCH VERIO) test strip CHECK BLOOD SUGAR TWICE DAILY Dx E11.9   omeprazole (PRILOSEC) 20 MG capsule TAKE 1 CAPSULE BY MOUTH DAILY   ONETOUCH DELICA LANCETS 33G MISC Use to check BG up to bid.  Dx: E11.65 type 2 DM with insulin therapy, uncontrolled   valsartan-hydrochlorothiazide (DIOVAN-HCT) 160-25 MG tablet  TAKE 1 TABLET BY MOUTH DAILY   No facility-administered encounter medications on file as of 02/08/2024.    Allergies  Allergen Reactions   Metformin And Related Other (See Comments)    seizures   Penicillins Diarrhea and Nausea And Vomiting    Review of Systems  Neurological:  Positive for dizziness.   Objective:  Temp (!) 97 F (36.1 C)   HR 83 Temp 36.1   Wt Readings from Last 3 Encounters:  01/17/24 159 lb 12.8 oz (72.5 kg)  09/27/23 162 lb 3.2 oz (73.6 kg)  07/25/23 160 lb (72.6 kg)  Orthostatic VS for the past 72 hrs (Last 3 readings):  Orthostatic BP Patient Position BP Location Cuff Size Orthostatic Pulse  02/08/24 1024 104/54 Standing Left Arm Normal 87  02/08/24 1023 100/55 Sitting Left Arm Normal 83  02/08/24 1015 107/59 Supine Left Arm Normal 83     Physical Exam Constitutional:      General: She is awake. She is not in acute distress.    Appearance: Normal appearance. She is well-developed and well-groomed. She is not ill-appearing, toxic-appearing or diaphoretic.  Eyes:     Extraocular Movements:     Right eye: Normal extraocular motion and no nystagmus.     Left eye: Normal extraocular motion and no nystagmus.  Cardiovascular:     Rate and Rhythm: Normal rate and regular rhythm.     Heart sounds: Normal  heart sounds.  Pulmonary:     Effort: Pulmonary effort is normal.     Breath sounds: Normal breath sounds.  Musculoskeletal:     Comments: Generalized weakness, using wheelchair   Skin:    General: Skin is warm.     Capillary Refill: Capillary refill takes less than 2 seconds.  Neurological:     Mental Status: She is alert and easily aroused.     Cranial Nerves: Cranial nerve deficit and facial asymmetry present.     Motor: Weakness and tremor present. No atrophy or abnormal muscle tone.     Gait: Gait abnormal.     Comments: Unable to assess tandem walking or Romberg due to patient tremors in bilateral legs and hands. Left arm strength 4/5  Right arm strength 5/5  Left hand strength 4/5  Right hand strength 5/5  Left leg strength 4/5  Right leg strength 5/5   Psychiatric:        Behavior: Behavior is cooperative.    Results for orders placed or performed in visit on 01/17/24  Bayer DCA Hb A1c Waived   Collection Time: 01/17/24 12:14 PM  Result Value Ref Range   HB A1C (BAYER DCA - WAIVED) 10.0 (H) 4.8 - 5.6 %  CMP14+EGFR   Collection Time: 01/17/24 12:16 PM  Result Value Ref Range   Glucose 134 (H) 70 - 99 mg/dL   BUN 28 (H) 8 - 27 mg/dL   Creatinine, Ser 1.61 (H) 0.57 - 1.00 mg/dL   eGFR 52 (L) >09 UE/AVW/0.98   BUN/Creatinine Ratio 25 12 - 28   Sodium 140 134 - 144 mmol/L   Potassium 4.1 3.5 - 5.2 mmol/L   Chloride 103 96 - 106 mmol/L   CO2 22 20 - 29 mmol/L   Calcium 10.1 8.7 - 10.3 mg/dL   Total Protein 7.2 6.0 - 8.5 g/dL   Albumin 4.2 3.8 - 4.8 g/dL   Globulin, Total 3.0 1.5 - 4.5 g/dL   Bilirubin Total 0.4 0.0 - 1.2 mg/dL   Alkaline Phosphatase 89 44 - 121 IU/L  AST 20 0 - 40 IU/L   ALT 19 0 - 32 IU/L       01/17/2024   11:36 AM 09/27/2023   10:01 AM 07/25/2023    3:37 PM 03/24/2023    9:46 AM 12/23/2022    9:34 AM  Depression screen PHQ 2/9  Decreased Interest 0 0 0 0 0  Down, Depressed, Hopeless 0 0 0 0 0  PHQ - 2 Score 0 0 0 0 0  Altered sleeping     0 0  Tired, decreased energy    0 0  Change in appetite    0 0  Feeling bad or failure about yourself     0 0  Trouble concentrating    0 0  Moving slowly or fidgety/restless    0 0  Suicidal thoughts    0 0  PHQ-9 Score    0 0  Difficult doing work/chores    Not difficult at all Not difficult at all       06/27/2023   10:01 AM 06/08/2022   10:03 AM  GAD 7 : Generalized Anxiety Score  Nervous, Anxious, on Edge 0 0  Control/stop worrying 0 0  Worry too much - different things 0 0  Trouble relaxing 0 0  Restless 0 0  Easily annoyed or irritable 0 0  Afraid - awful might happen 0 0  Total GAD 7 Score 0 0  Anxiety Difficulty Not difficult at all Not difficult at all    Pertinent labs & imaging results that were available during my care of the patient were reviewed by me and considered in my medical decision making.  Assessment & Plan:  Avelynn was seen today for dizziness.  Diagnoses and all orders for this visit:  Dizziness Endorses fall with head trauma. Currently only taking ASA daily. Will complete imaging and labs as below. Will communicate results to patient once available. Will await results to determine next steps.  Patient has not followed up with neurology since 2015. Reviewed notes from Karel Jarvis, MD on 03/21/2014 where patient was evaluated for "whole body shaking". Patient was instructed to follow up as needed. Will await results from below and will consider referral to neurology.  Discussed with patient to call EMS or present to ED if symptoms like this happen again.  Patient did not monitor BG during event. Reviewed CMP and A1C from 01/17/24, stable.  -     Anemia Profile B -     CMP14+EGFR -     TSH -     Magnesium -     CT HEAD WO CONTRAST ( ); Future  Loss of consciousness (HCC) As above.  -     Anemia Profile B -     CMP14+EGFR -     TSH -     Magnesium -     CT HEAD WO CONTRAST ( ); Future  Fall, initial encounter As above.  -     Anemia Profile  B -     CMP14+EGFR -     TSH -     Magnesium -     CT HEAD WO CONTRAST ( ); Future  Left-sided weakness As above.  -     Anemia Profile B -     CMP14+EGFR -     TSH -     Magnesium -     CT HEAD WO CONTRAST ( ); Future  History of CVA (cerebrovascular accident) As above.  -     CT HEAD WO CONTRAST (  ); Future  Tremor As above.    Continue all other maintenance medications.  Follow up plan: Return if symptoms worsen or fail to improve.   Continue healthy lifestyle choices, including diet (rich in fruits, vegetables, and lean proteins, and low in salt and simple carbohydrates) and exercise (at least 30 minutes of moderate physical activity daily).  Written and verbal instructions provided   The above assessment and management plan was discussed with the patient. The patient verbalized understanding of and has agreed to the management plan. Patient is aware to call the clinic if they develop any new symptoms or if symptoms persist or worsen. Patient is aware when to return to the clinic for a follow-up visit. Patient educated on when it is appropriate to go to the emergency department.   Neale Burly, DNP-FNP [facility] [address] [place], [place] [address] [phone]

## 2024-02-08 NOTE — Addendum Note (Signed)
Addended by: Neale Burly on: 02/08/2024 03:11 PM   Modules accepted: Orders

## 2024-02-08 NOTE — Progress Notes (Signed)
No acute abnormality. Will await results of labs. Will refer patient back to neurology for further evaluation.

## 2024-02-09 ENCOUNTER — Encounter: Payer: Self-pay | Admitting: Family Medicine

## 2024-02-09 LAB — CMP14+EGFR
ALT: 24 [IU]/L (ref 0–32)
AST: 33 [IU]/L (ref 0–40)
Albumin: 3.9 g/dL (ref 3.8–4.8)
Alkaline Phosphatase: 69 [IU]/L (ref 44–121)
BUN/Creatinine Ratio: 25 (ref 12–28)
BUN: 40 mg/dL — ABNORMAL HIGH (ref 8–27)
Bilirubin Total: 0.2 mg/dL (ref 0.0–1.2)
CO2: 17 mmol/L — ABNORMAL LOW (ref 20–29)
Calcium: 9.4 mg/dL (ref 8.7–10.3)
Chloride: 101 mmol/L (ref 96–106)
Creatinine, Ser: 1.6 mg/dL — ABNORMAL HIGH (ref 0.57–1.00)
Globulin, Total: 2.6 g/dL (ref 1.5–4.5)
Glucose: 398 mg/dL — ABNORMAL HIGH (ref 70–99)
Potassium: 4 mmol/L (ref 3.5–5.2)
Sodium: 137 mmol/L (ref 134–144)
Total Protein: 6.5 g/dL (ref 6.0–8.5)
eGFR: 34 mL/min/{1.73_m2} — ABNORMAL LOW (ref >59)

## 2024-02-09 LAB — ANEMIA PROFILE B
Basophils Absolute: 0 10*3/uL (ref 0.0–0.2)
Basos: 1 %
EOS (ABSOLUTE): 0 10*3/uL (ref 0.0–0.4)
Eos: 1 %
Ferritin: 496 ng/mL — ABNORMAL HIGH (ref 15–150)
Folate: 14.9 ng/mL (ref >3.0)
Hematocrit: 34.1 % (ref 34.0–46.6)
Hemoglobin: 11.1 g/dL (ref 11.1–15.9)
Immature Grans (Abs): 0 10*3/uL (ref 0.0–0.1)
Immature Granulocytes: 0 %
Iron Saturation: 14 % — ABNORMAL LOW (ref 15–55)
Iron: 38 ug/dL (ref 27–139)
Lymphocytes Absolute: 0.9 10*3/uL (ref 0.7–3.1)
Lymphs: 35 %
MCH: 29.1 pg (ref 26.6–33.0)
MCHC: 32.6 g/dL (ref 31.5–35.7)
MCV: 90 fL (ref 79–97)
Monocytes Absolute: 0.5 10*3/uL (ref 0.1–0.9)
Monocytes: 20 %
Neutrophils Absolute: 1.1 10*3/uL — ABNORMAL LOW (ref 1.4–7.0)
Neutrophils: 43 %
Platelets: 247 10*3/uL (ref 150–450)
RBC: 3.81 x10E6/uL (ref 3.77–5.28)
RDW: 12.5 % (ref 11.7–15.4)
Retic Ct Pct: 0.9 % (ref 0.6–2.6)
Total Iron Binding Capacity: 264 ug/dL (ref 250–450)
UIBC: 226 ug/dL (ref 118–369)
Vitamin B-12: 714 pg/mL (ref 232–1245)
WBC: 2.6 10*3/uL — ABNORMAL LOW (ref 3.4–10.8)

## 2024-02-09 LAB — MAGNESIUM: Magnesium: 2 mg/dL (ref 1.6–2.3)

## 2024-02-09 LAB — TSH: TSH: 1.07 u[IU]/mL (ref 0.450–4.500)

## 2024-02-09 NOTE — Progress Notes (Signed)
Iron stores and decreased GFR suggestive of dehydration. Recommend patient increase water intake to 80-100 oz of water daily. Recommend patient follow up with PCP and neurology

## 2024-02-28 DIAGNOSIS — M79676 Pain in unspecified toe(s): Secondary | ICD-10-CM | POA: Diagnosis not present

## 2024-02-28 DIAGNOSIS — E1151 Type 2 diabetes mellitus with diabetic peripheral angiopathy without gangrene: Secondary | ICD-10-CM | POA: Diagnosis not present

## 2024-02-28 DIAGNOSIS — B351 Tinea unguium: Secondary | ICD-10-CM | POA: Diagnosis not present

## 2024-02-28 DIAGNOSIS — L84 Corns and callosities: Secondary | ICD-10-CM | POA: Diagnosis not present

## 2024-03-02 ENCOUNTER — Telehealth: Payer: Self-pay | Admitting: Pharmacist

## 2024-03-02 NOTE — Telephone Encounter (Signed)
   Patient was identified as falling into the [place] Measure - Diabetes. A1c on 01/17/24 was 10% and holding since 2024.  Patient was: Left voicemail to schedule with primary care provider.  Referred to pharmacy for chronic disease management.    RN appt 03/06/24 PharmD appt 03/22/24  Will continue to follow  .Kieth Brightly, PharmD, BCACP, CPP Clinical Pharmacist, [facility]

## 2024-03-06 ENCOUNTER — Other Ambulatory Visit: Payer: Self-pay | Admitting: *Deleted

## 2024-03-06 NOTE — Patient Instructions (Signed)
 Visit Information  Thank you for taking time to visit with me today. Please don't hesitate to contact me if I can be of assistance to you before our next scheduled telephone appointment.  Following are the goals we discussed today:   Goals Addressed             This Visit's Progress    RNCM Care Management Expected Outcomes: Monitor, Self-Manage and Reduce Symptoms of: DM, HTN       Current Barriers:  Knowledge Deficits related to plan of care for management of HTN and DMII  Chronic Disease Management support and education needs related to HTN and DMII   RNCM Clinical Goal(s):  Patient will verbalize basic understanding of  HTN and DMII disease process and self health management plan as evidenced by verbal explanation, recognizing symptoms, lifestyle modifications take all medications exactly as prescribed and will call provider for medication related questions as evidenced by compliance with all medications attend all scheduled medical appointments: with primary care provider and specialist as evidenced by keeping all scheduled appointments demonstrate Improved and Ongoing adherence to prescribed treatment plan for HTN and DMII as evidenced by consistent medication compliance, symptom monitoring and continued lifestyle modifications continue to work with RN Care Manager to address care management and care coordination needs related to  HTN and DMII as evidenced by adherence to CM Team Scheduled appointments through collaboration with RN Care manager, provider, and care team.   Interventions: Evaluation of current treatment plan related to  self management and patient's adherence to plan as established by provider   Diabetes Interventions:  (Status:  Goal on track:  Yes.) Long Term Goal Assessed patient's understanding of A1c goal: <7% Provided education to patient about basic DM disease process. A1C remains above goal. Patient checks blood sugar daily and keeps a record. She reports  the following fasting numbers for the last week: 184, 230, 158, 216, 193, 154.  RNCM provided reinforced education with patient the fasting goal <130 and post prandial <180.  Reviewed medications with patient and discussed importance of medication adherence. Reports compliance with Farxiga and Glipizide.  Counseled on importance of regular laboratory monitoring as prescribed. Labs up to date. Discussed plans with patient for ongoing care management follow up and provided patient with direct contact information for care management team Provided patient with written educational materials related to hypo and hyperglycemia and importance of correct treatment. Denies any hypo/hyperglycemia. Reviewed scheduled/upcoming provider appointments including: 05-24-2024 with PCP Advised patient, providing education and rationale, to check cbg at least once daily or per provider recommendations and record, calling provider for findings outside established parameters.  Review of patient status, including review of consultants reports, relevant laboratory and other test results, and medications completed Screening for signs and symptoms of depression related to chronic disease state  Assessed social determinant of health barriers Lab Results  Component Value Date   HGBA1C 10.0 (H) 01/17/2024    Hypertension Interventions:  (Status:  Goal on track:  Yes.) Long Term Goal Last practice recorded BP readings:  BP Readings from Last 3 Encounters:  01/17/24 124/70  09/27/23 134/78  06/27/23 106/65   Most recent eGFR/CrCl:  Lab Results  Component Value Date   EGFR 34 (L) 02/08/2024    No components found for: "CRCL"  Evaluation of current treatment plan related to hypertension self management and patient's adherence to plan as established by provider. Regularly checks blood pressure and self reported BP today of 128/60. Denies any chest pain, dizziness or  swelling. Provided education to patient re: stroke  prevention, s/s of heart attack and stroke. Support and education provided Reviewed medications with patient and discussed importance of compliance. Reports compliance with all medications Counseled on adverse effects of illicit drug and excessive alcohol use in patients with high blood pressure. Denies illicit drug or alcohol use.  Counseled on the importance of exercise goals with target of 150 minutes per week Discussed plans with patient for ongoing care management follow up and provided patient with direct contact information for care management team Advised patient, providing education and rationale, to monitor blood pressure daily and record, calling PCP for findings outside established parameters. Reviewed scheduled/upcoming provider appointments including: 05-24-2024 with PCP Advised patient to discuss any new changes with provider Provided education on prescribed diet low carb Discussed complications of poorly controlled blood pressure such as heart disease, stroke, circulatory complications, vision complications, kidney impairment, sexual dysfunction Screening for signs and symptoms of depression related to chronic disease state  Assessed social determinant of health barriers  Patient Goals/Self-Care Activities: Take all medications as prescribed Attend all scheduled provider appointments Call pharmacy for medication refills 3-7 days in advance of running out of medications Attend church or other social activities Perform all self care activities independently  Perform IADL's (shopping, preparing meals, housekeeping, managing finances) independently Call provider office for new concerns or questions  schedule appointment with eye doctor check blood sugar at prescribed times: once daily check feet daily for cuts, sores or redness take the blood sugar log to all doctor visits check blood pressure weekly write blood pressure results in a log or diary take blood pressure log to all  doctor appointments  Follow Up Plan:  Telephone follow up appointment with care management team member scheduled for:  04-03-2024 at 10:30 am           Our next appointment is by telephone on 04-03-2024 at 10:30 am  Please call the care guide team at [phone] if you need to cancel or reschedule your appointment.   If you are experiencing a Mental Health or Behavioral Health Crisis or need someone to talk to, please call the Suicide and Crisis Lifeline: 988 call the [place] National Suicide Prevention Lifeline: (934)275-9402 or TTY: 5346102811 TTY 619-347-3330) to talk to a trained counselor call 1-800-273-TALK (toll free, 24 hour hotline)   Patient verbalizes understanding of instructions and care plan provided today and agrees to view in MyChart. Active MyChart status and patient understanding of how to access instructions and care plan via MyChart confirmed with patient.     Telephone follow up appointment with care management team member scheduled for:04-03-2024 at 10:30 am  Larey Brick, BSN RN [facility], [facility] RN Care Manager Direct Dial: [phone]  Fax: [phone]

## 2024-03-06 NOTE — Patient Outreach (Signed)
 Care Management   Visit Note  03/06/2024 Name: Renee [name] MRN: [id number] DOB: [birth year]  Subjective: Renee [name] is a 73 y.o. year old female who is a primary care patient of Junie Spencer, FNP. The Care Management team was consulted for assistance.      Engaged with patient spoke with patient by telephone.    Goals Addressed             This Visit's Progress    RNCM Care Management Expected Outcomes: Monitor, Self-Manage and Reduce Symptoms of: DM, HTN       Current Barriers:  Knowledge Deficits related to plan of care for management of HTN and DMII  Chronic Disease Management support and education needs related to HTN and DMII   RNCM Clinical Goal(s):  Patient will verbalize basic understanding of  HTN and DMII disease process and self health management plan as evidenced by verbal explanation, recognizing symptoms, lifestyle modifications take all medications exactly as prescribed and will call provider for medication related questions as evidenced by compliance with all medications attend all scheduled medical appointments: with primary care provider and specialist as evidenced by keeping all scheduled appointments demonstrate Improved and Ongoing adherence to prescribed treatment plan for HTN and DMII as evidenced by consistent medication compliance, symptom monitoring and continued lifestyle modifications continue to work with RN Care Manager to address care management and care coordination needs related to  HTN and DMII as evidenced by adherence to CM Team Scheduled appointments through collaboration with RN Care manager, provider, and care team.   Interventions: Evaluation of current treatment plan related to  self management and patient's adherence to plan as established by provider   Diabetes Interventions:  (Status:  Goal on track:  Yes.) Long Term Goal Assessed patient's understanding of A1c goal: <7% Provided education to patient about basic DM disease  process. A1C remains above goal. Patient checks blood sugar daily and keeps a record. She reports the following fasting numbers for the last week: 184, 230, 158, 216, 193, 154.  RNCM provided reinforced education with patient the fasting goal <130 and post prandial <180.  Reviewed medications with patient and discussed importance of medication adherence. Reports compliance with Farxiga and Glipizide.  Counseled on importance of regular laboratory monitoring as prescribed. Labs up to date. Discussed plans with patient for ongoing care management follow up and provided patient with direct contact information for care management team Provided patient with written educational materials related to hypo and hyperglycemia and importance of correct treatment. Denies any hypo/hyperglycemia. Reviewed scheduled/upcoming provider appointments including: 05-24-2024 with PCP Advised patient, providing education and rationale, to check cbg at least once daily or per provider recommendations and record, calling provider for findings outside established parameters.  Review of patient status, including review of consultants reports, relevant laboratory and other test results, and medications completed Screening for signs and symptoms of depression related to chronic disease state  Assessed social determinant of health barriers Lab Results  Component Value Date   HGBA1C 10.0 (H) 01/17/2024    Hypertension Interventions:  (Status:  Goal on track:  Yes.) Long Term Goal Last practice recorded BP readings:  BP Readings from Last 3 Encounters:  01/17/24 124/70  09/27/23 134/78  06/27/23 106/65   Most recent eGFR/CrCl:  Lab Results  Component Value Date   EGFR 34 (L) 02/08/2024    No components found for: "CRCL"  Evaluation of current treatment plan related to hypertension self management and patient's adherence to plan as  established by provider. Regularly checks blood pressure and self reported BP today of  128/60. Denies any chest pain, dizziness or swelling. Provided education to patient re: stroke prevention, s/s of heart attack and stroke. Support and education provided Reviewed medications with patient and discussed importance of compliance. Reports compliance with all medications Counseled on adverse effects of illicit drug and excessive alcohol use in patients with high blood pressure. Denies illicit drug or alcohol use.  Counseled on the importance of exercise goals with target of 150 minutes per week Discussed plans with patient for ongoing care management follow up and provided patient with direct contact information for care management team Advised patient, providing education and rationale, to monitor blood pressure daily and record, calling PCP for findings outside established parameters. Reviewed scheduled/upcoming provider appointments including: 05-24-2024 with PCP Advised patient to discuss any new changes with provider Provided education on prescribed diet low carb Discussed complications of poorly controlled blood pressure such as heart disease, stroke, circulatory complications, vision complications, kidney impairment, sexual dysfunction Screening for signs and symptoms of depression related to chronic disease state  Assessed social determinant of health barriers  Patient Goals/Self-Care Activities: Take all medications as prescribed Attend all scheduled provider appointments Call pharmacy for medication refills 3-7 days in advance of running out of medications Attend church or other social activities Perform all self care activities independently  Perform IADL's (shopping, preparing meals, housekeeping, managing finances) independently Call provider office for new concerns or questions  schedule appointment with eye doctor check blood sugar at prescribed times: once daily check feet daily for cuts, sores or redness take the blood sugar log to all doctor visits check blood  pressure weekly write blood pressure results in a log or diary take blood pressure log to all doctor appointments  Follow Up Plan:  Telephone follow up appointment with care management team member scheduled for:  04-03-2024 at 10:30 am           Consent to Services:  Patient was given information about care management services, agreed to services, and gave verbal consent to participate.   Plan: Telephone follow up appointment with care management team member scheduled for:04-03-2024 at 10:30 am  Larey Brick, BSN RN [facility], [facility] RN Care Manager Direct Dial: [phone]  Fax: [phone]

## 2024-03-21 ENCOUNTER — Encounter: Payer: Self-pay | Admitting: Neurology

## 2024-03-21 ENCOUNTER — Ambulatory Visit: Admitting: Neurology

## 2024-03-21 VITALS — BP 125/75 | HR 81 | Ht 66.0 in | Wt 162.4 lb

## 2024-03-21 DIAGNOSIS — R55 Syncope and collapse: Secondary | ICD-10-CM | POA: Diagnosis not present

## 2024-03-21 DIAGNOSIS — R251 Tremor, unspecified: Secondary | ICD-10-CM

## 2024-03-21 NOTE — Patient Instructions (Signed)
 Good to see you again.  Schedule MRI brain without contrast  2. Schedule EEG  3. Referral will be sent to Cardiology  4. Continue working on increasing hydrated  5. Follow-up in 4-5 months, call for any changes

## 2024-03-21 NOTE — Progress Notes (Signed)
 NEUROLOGY CONSULTATION NOTE  Renee [name] MRN: [id number] DOB: Jun 20, 1951  Referring provider: Neale Burly, FNP Primary care provider: Jannifer Rodney, FNP  Reason for consult:  syncope   Thank you for your kind referral of Renee [name] for consultation of the above symptoms. Although her history is well known to you, please allow me to reiterate it for the purpose of our medical record. The patient was accompanied to the clinic by her niece Renee [name] who also provides collateral information. Records and images were personally reviewed where available.   HISTORY OF PRESENT ILLNESS: This is a pleasant 73 year old right-handed woman with a history of hypertension, DM2 with neuropathy, hyperlipidemia, seen previously in our office for functional shaking episodes in 2015, presenting for evaluation of loss of consciousness that occurred 02/05/24. She lives alone and was in the bathroom getting ready to dry herself, she recalls squeezing the rag then waking up in the bathtub. She had fallen backwards. She got up then passed out again, waking up on the bathroom floor. She recalls trying to pull herself up to the sink and she was able to get in her bed, then she felt like she needed to go to the bathroom. She then had another syncopal episode in the kitchen. No tongue bite, incontinence, focal weakness. She does not recall being dizzy or diaphoretic. She went to her PCP 3 days after and bloodwork showed an elevated BUN and creatinine (40/1.60) compared to bloodwork from 2 weeks prior (creatinine 1.11). Head CT was ordered which I personally reviewed, no acute changes, there was diffuse atrophy. She denies any further syncopal episodes since then. She denies feeling dizzy but sometimes feels "a little shaky" for a few minutes. Sometimes the sensation starts in her legs and radiates to her chest then her head shakes. She feels her heart beating fast and her head nods. She has to get herself controlled for  a few minutes. Sometimes she gets a little shaking in her knees or holds her hands together when they are shaky. She is taking Gabapentin once a day (instead of TID) for neuropathy, there is tingling and burning in her toes and base of her fingers. She has a little neck pain, no back pain. She denies any headaches, diplopia, dysarthria/dysphagia. Memory is not so good, she has to stop and think about it then remembers. She denies missing medications. Mood is pretty good. She was previously living with her siblings until her sister passed away in September 13, 2024. She has been living alone since January. Renee [name] sees her once a month, her other sister lives close and she goes to the hall 3 days a week for activities. She does not drive.   Records from 2015 were reviewed as follows: This is a pleasant 73 year old right-handed woman presenting for evaluation of shaking episodes.  She was previously seen by one of my partners, Movement Disorders specialist Dr. Arbutus [name], 2 months ago during which she was noted to have an atypical and nonphysiologic tremor during the visit, suggestive of psychogenic tremors.  She presents today for evaluation of possible seizures, she has undergone 24-hour EEG monitoring in the interim.  I will summarize her history as well.  She was admitted to [facility] in December 2014 for a UTI with weakness.  There was note of shaking and chills secondary to the infection.  Per report, she was having chest pain after being on Metformin for 3 days, that was then discontinued.  On the day of admission,  a friend visited her at home and found her so weak, with note of left arm and leg weakness.  She started shaking with retained awareness.  She was slow to respond to EMS.  They had stated that some said it was tremor and others mentioned seizure, and she was apparently discharged on Keppra at that time.  She was discharged to rehab where she had more shaking episodes.  On 12/31/2013, she was at home and  started having a tremor in both legs.  This progressed to whole body shaking with retained awareness.  She was able to call her sister, who arrived at her house with continued shaking.  She was noted to be slow to respond.  EMS was called due to increasingly violent shaking with eyes rolling back.  She was brought to the ER where Keppra was increased.  She feels that she cannot control the shaking, she is able to talk sometimes but feels confused.  She started having itching, attributed to Keppra, which was discontinued in January.  She has been off anti-seizure medications since then, with no further similar episodes of whole body shaking.  She however continues to have the leg tremors that can occur when standing or sitting.  If she is standing, she has to sit and wait for a few minutes to quiet down.  Last episode was 4 days ago, she can speak and comprehend during the bilateral leg shaking lasting 10-15 minutes.  They report that if she did not sit down, the shaking in her legs would progress to whole body shaking.  She has been living with her sister since January.     They report a history of seizures in her 59s that were "totally different."  She would have brief shaking lasting 5-10 minutes without loss of awareness.  She was not started on seizure medication.  These lasted 3 months then resolved spontaneously.  She had an MRI brain which was normal.  I personally reviewed her 24-hour EEG which was normal.  Typical whole body shaking episodes were not captured, however the patient reports she had bilateral leg shaking during the study.     PAST MEDICAL HISTORY: Past Medical History:  Diagnosis Date   Arthritis    Breast cancer (HCC) 2001   right mastectomy   CVA (cerebral infarction) 2003, 2005   DM (diabetes mellitus) (HCC)    GERD (gastroesophageal reflux disease)    Heart murmur    Hyperlipidemia    Hypertension    Seizures (HCC)    Vitamin D deficiency     PAST SURGICAL  HISTORY: Past Surgical History:  Procedure Laterality Date   ABDOMINAL HYSTERECTOMY  2000   GANGLION CYST EXCISION  2010   right (2010)  and left (before 2010) hand   MASTECTOMY  2001   right   TONSILECTOMY/ADENOIDECTOMY WITH MYRINGOTOMY Bilateral    age 52's    MEDICATIONS: Current Outpatient Medications on File Prior to Visit  Medication Sig Dispense Refill   aspirin 81 MG tablet Take 81 mg by mouth daily.     atorvastatin (LIPITOR) 40 MG tablet Take 1 tablet (40 mg total) by mouth daily. 90 tablet 3   Blood Glucose Calibration (ONETOUCH VERIO) SOLN Use as needed to calibrate glucometer 1 each 0   Blood Glucose Monitoring Suppl (ONETOUCH VERIO FLEX SYSTEM) w/Device KIT Use to check blood sugar twice daily 1 kit 0   Cholecalciferol (VITAMIN D3) 10 MCG (400 UNIT) CAPS Take by mouth.     dapagliflozin  propanediol (FARXIGA) 10 MG TABS tablet Take 1 tablet (10 mg total) by mouth daily before breakfast. 90 tablet 4   docusate sodium (COLACE) 100 MG capsule Take 100 mg by mouth daily.     gabapentin (NEURONTIN) 400 MG capsule Take 1 capsule (400 mg total) by mouth 3 (three) times daily. 300 capsule 0   glipiZIDE (GLUCOTROL) 10 MG tablet Take 1 tablet (10 mg total) by mouth 2 (two) times daily before a meal. 200 tablet 0   glucose blood (ONETOUCH VERIO) test strip CHECK BLOOD SUGAR TWICE DAILY Dx E11.9 200 each 3   omeprazole (PRILOSEC) 20 MG capsule TAKE 1 CAPSULE BY MOUTH DAILY 100 capsule 0   ONETOUCH DELICA LANCETS 33G MISC Use to check BG up to bid.  Dx: E11.65 type 2 DM with insulin therapy, uncontrolled 100 each 2   valsartan-hydrochlorothiazide (DIOVAN-HCT) 160-25 MG tablet TAKE 1 TABLET BY MOUTH DAILY 100 tablet 0   No current facility-administered medications on file prior to visit.    ALLERGIES: Allergies  Allergen Reactions   Metformin And Related Other (See Comments)    seizures   Penicillins Diarrhea and Nausea And Vomiting    FAMILY HISTORY: Family History  Problem  Relation Age of Onset   Alzheimer's disease Mother    Parkinson's disease Mother    Heart Problems Father    Kidney disease Father        +dialysis   Heart disease Father    Other Sister        +TAH for fibroids   Breast cancer Sister        dx. 55-56   Renal cancer Sister        dx. 39-63; not a smoker   Early death Brother    Diabetes Brother    Cancer Paternal Aunt        NOS cancer   Colon cancer Neg Hx    Stomach cancer Neg Hx     SOCIAL HISTORY: Social History   Socioeconomic History   Marital status: Single    Spouse name: Not on file   Number of children: Not on file   Years of education: 12   Highest education level: Associate degree: academic program  Occupational History   Occupation: Retired    Associate Professor: [employer]    Comment: [employer]   Tobacco Use   Smoking status: Never   Smokeless tobacco: Never  Vaping Use   Vaping status: Never Used  Substance and Sexual Activity   Alcohol use: Not Currently    Alcohol/week: 1.0 standard drink of alcohol    Types: 1 Glasses of wine per week   Drug use: No   Sexual activity: Not Currently    Birth control/protection: Post-menopausal  Other Topics Concern   Not on file  Social History Narrative   Retired from school system, single, no children. Enjoys reading and word puzzles.    Jehovah's Witness - goes to [employer] get together [place] [employer], Saturdays   Her parents left their home to her and her sister   Social Drivers of [employer]: Low Risk  (07/25/2023)   Overall Financial Resource Strain (CARDIA)    Difficulty of Paying Living Expenses: Not hard at all  Food Insecurity: No Food Insecurity (07/25/2023)   Hunger Vital Sign    Worried About Running Out of Food in the Last Year: Never true    Ran Out of Food in the  Last Year: Never true  Transportation Needs: No Transportation Needs (07/25/2023)   PRAPARE - Doctor, general practice (Medical): No    Lack of Transportation (Non-Medical): No  Physical Activity: Insufficiently Active (07/25/2023)   Exercise Vital Sign    Days of Exercise per Week: 3 days    Minutes of Exercise per Session: 30 min  Stress: No Stress Concern Present (07/25/2023)   [employer] of Occupational Health - Occupational Stress Questionnaire    Feeling of Stress : Not at all  Social Connections: Moderately Isolated (07/25/2023)   Social Connection and Isolation Panel [NHANES]    Frequency of Communication with Friends and Family: More than three times a week    Frequency of Social Gatherings with Friends and Family: More than three times a week    Attends Religious Services: More than 4 times per year    Active Member of [employer] or Organizations: No    Attends Banker Meetings: Never    Marital Status: Never married  Intimate Partner Violence: Not At Risk (07/25/2023)   Humiliation, Afraid, Rape, and Kick questionnaire    Fear of Current or Ex-Partner: No    Emotionally Abused: No    Physically Abused: No    Sexually Abused: No     PHYSICAL EXAM: Vitals:   03/21/24 0828  BP: 125/75  Pulse: 81  SpO2: 98%   General: No acute distress Head:  Normocephalic/atraumatic Skin/Extremities: No rash, no edema Neurological Exam: Mental status: alert and oriented to person, place, and time, no dysarthria or aphasia, Fund of knowledge is appropriate.  Recent and remote memory are intact, 2/3 delayed recall.  Attention and concentration are normal, 5/5 WORLD backwards. Cranial nerves: CN I: not tested CN II: pupils equal, round, visual fields intact CN III, IV, VI:  full range of motion, no nystagmus, no ptosis CN V: facial sensation intact CN VII: upper and lower face symmetric CN VIII: hearing intact to conversation Bulk & Tone: mild left cogwheeling, no fasciculations. Motor: 5/5 throughout with no pronator drift. Sensation: intact to light touch, cold,  vibration sense.  No extinction to double simultaneous stimulation.   Deep Tendon Reflexes: +1 throughout Cerebellar: no incoordination on finger to nose testing Gait: slow and cautious, no ataxia Tremor: none in office today Good finger taps   IMPRESSION: This is a pleasant 73 year old right-handed woman with a history of hypertension, DM2 with neuropathy, hyperlipidemia, seen previously in our office for non-epileptic shaking episodes in 2015, presenting for evaluation of loss of consciousness that occurred 02/05/24. She reports 3 episodes of passing out in one evening, etiology unclear, likely vasovagal syncope/dehydration. Bloodwork showed elevated BUN/creatinine at that time. She reports shaking of her legs or hands, episode witnessed in 2015 with leg shaking in the office was functional. MRI brain without contrast and EEG will be ordered. She will be referred to Cardiology for syncope. She was advised to continue increasing hydration. She does not drive. Follow-up in 4-5 months, call for any changes.    Thank you for allowing me to participate in the care of this patient. Please do not hesitate to call for any questions or concerns.   Patrcia Dolly, M.D.  CC: Renee Burly, FNP, Renee Rodney, FNP

## 2024-03-23 ENCOUNTER — Other Ambulatory Visit

## 2024-03-27 ENCOUNTER — Ambulatory Visit (INDEPENDENT_AMBULATORY_CARE_PROVIDER_SITE_OTHER)

## 2024-03-27 DIAGNOSIS — R251 Tremor, unspecified: Secondary | ICD-10-CM

## 2024-03-27 DIAGNOSIS — R55 Syncope and collapse: Secondary | ICD-10-CM

## 2024-03-28 NOTE — Procedures (Signed)
 ELECTROENCEPHALOGRAM REPORT  Date of Study: 03/27/2024  Patient's Name: Renee [name] MRN: [id number] Date of Birth: [birth year]  Referring Provider: Dr. Patrcia Dolly  Clinical History: This is a 73 year old woman with a history of functional shaking episodes in 2015, presenting for 3 syncopal episodes in one day in 01/2024. EEG for classification.  CNS Active Medications: Gabapentin  Technical Summary: A multichannel digital EEG recording measured by the international 10-20 system with electrodes applied with paste and impedances below 5000 ohms performed in our laboratory with EKG monitoring in an awake and asleep patient.  Hyperventilation was not performed. Photic stimulation was performed.  The digital EEG was referentially recorded, reformatted, and digitally filtered in a variety of bipolar and referential montages for optimal display.    Description: The patient is awake and asleep during the recording.  During maximal wakefulness, there is a symmetric, medium voltage 9 Hz posterior dominant rhythm that attenuates with eye opening.  The record is symmetric.  During drowsiness and sleep, there is an increase in theta slowing of the background.  Vertex waves and symmetric sleep spindles were seen. Photic stimulation did not elicit any abnormalities.  There were no epileptiform discharges or electrographic seizures seen.    EKG lead was unremarkable.  Impression: This awake and asleep EEG is normal.    Clinical Correlation: A normal EEG does not exclude a clinical diagnosis of epilepsy.  If further clinical questions remain, prolonged EEG may be helpful.  Clinical correlation is advised.   Patrcia Dolly, M.D.

## 2024-04-02 ENCOUNTER — Ambulatory Visit: Payer: Medicare Other | Admitting: Neurology

## 2024-04-03 ENCOUNTER — Other Ambulatory Visit: Payer: Self-pay

## 2024-04-03 ENCOUNTER — Encounter: Payer: Self-pay | Admitting: *Deleted

## 2024-04-03 ENCOUNTER — Other Ambulatory Visit: Payer: Self-pay | Admitting: *Deleted

## 2024-04-03 NOTE — Patient Instructions (Signed)
 Visit Information  Thank you for taking time to visit with me today. Please don't hesitate to contact me if I can be of assistance to you before our next scheduled appointment.  Your next care management appointment is by telephone on 05-03-2024 at 9:45 am  Telephone follow-up in 1 month  Please call the care guide team at [phone] if you need to cancel, schedule, or reschedule an appointment.   Please call the Suicide and Crisis Lifeline: 988 call the [place] National Suicide Prevention Lifeline: (732)562-6838 or TTY: 831-499-5507 TTY 437-642-8484) to talk to a trained counselor call 1-800-273-TALK (toll free, 24 hour hotline) call the [facility]: 980-520-5670 if you are experiencing a Mental Health or Behavioral Health Crisis or need someone to talk to.  Danise Edge, BSN RN [facility], [facility] RN Care Manager Direct Dial: [phone]  Fax: [phone]

## 2024-04-03 NOTE — Patient Outreach (Addendum)
 Complex Care Management   Visit Note  04/03/2024  Name:  Renee [name] MRN: [id number] DOB: October 19, 1951  Situation: Referral received for Complex Care Management related to Diabetes with Complications I obtained verbal consent from patient.  Visit completed with patient  on the phone  Background:   Past Medical History:  Diagnosis Date   Arthritis    Breast cancer (HCC) 2001   right mastectomy   CVA (cerebral infarction) 2003, 2005   DM (diabetes mellitus) (HCC)    GERD (gastroesophageal reflux disease)    Heart murmur    Hyperlipidemia    Hypertension    Seizures (HCC)    Vitamin D deficiency     Assessment: Patient Reported Symptoms:  Cognitive Alert and oriented to person, place, and time  Neurological No symptoms reported    HEENT No symptoms reported    Cardiovascular No symptoms reported    Respiratory No symptoms reported    Endocrine No symptoms reported    Gastrointestinal No symptoms reported    Genitourinary No symptoms reported    Integumentary No symptoms reported    Musculoskeletal No symptoms reported    Psychosocial No symptoms reported     Vitals:   04/03/24 1042  BP: 136/67    Medications Reviewed Today     Reviewed by Ricky Stabs, RN (Registered Nurse) on 04/03/24 at 1131  Med List Status: <None>   Medication Order Taking? Sig Documenting Provider Last Dose Status Informant  aspirin 81 MG tablet [id number] Yes Take 81 mg by mouth daily. [provider] Taking Active Self  atorvastatin (LIPITOR) 40 MG tablet [id number] Yes Take 1 tablet (40 mg total) by mouth daily. Junie Spencer, FNP Taking Active   Blood Glucose Calibration (ONETOUCH VERIO) Criss Rosales [id number] Yes Use as needed to calibrate glucometer Eckard, Tammy, RPH-CPP Taking Active Self  Blood Glucose Monitoring Suppl (ONETOUCH VERIO FLEX SYSTEM) w/Device KIT [id number] Yes Use to check blood sugar twice daily Jannifer Rodney A, FNP Taking Active   Cholecalciferol  (VITAMIN D3) 10 MCG (400 UNIT) CAPS [id number] Yes Take by mouth. [provider] Taking Active   dapagliflozin propanediol (FARXIGA) 10 MG TABS tablet [id number] Yes Take 1 tablet (10 mg total) by mouth daily before breakfast. Jannifer Rodney A, FNP Taking Active   docusate sodium (COLACE) 100 MG capsule [id number] Yes Take 100 mg by mouth daily. [provider] Taking Active Self  gabapentin (NEURONTIN) 400 MG capsule [id number] Yes Take 1 capsule (400 mg total) by mouth 3 (three) times daily. Junie Spencer, FNP Taking Active   glipiZIDE (GLUCOTROL) 10 MG tablet [id number] Yes Take 1 tablet (10 mg total) by mouth 2 (two) times daily before a meal. Junie Spencer, FNP Taking Active   glucose blood (ONETOUCH VERIO) test strip [id number] Yes CHECK BLOOD SUGAR TWICE DAILY Dx E11.9 Jannifer Rodney A, FNP Taking Active   omeprazole (PRILOSEC) 20 MG capsule [id number] Yes TAKE 1 CAPSULE BY MOUTH DAILY Junie Spencer, FNP Taking Active   [facility] DELICA LANCETS 33G MISC [id number] Yes Use to check BG up to bid.  Dx: E11.65 type 2 DM with insulin therapy, uncontrolled Eckard, Tammy, RPH-CPP Taking Active Self  valsartan-hydrochlorothiazide (DIOVAN-HCT) 160-25 MG tablet [id number] Yes TAKE 1 TABLET BY MOUTH DAILY Junie Spencer, FNP Taking Active             Recommendation:   PCP Follow-up  Follow Up Plan:   Telephone follow up appointment date/time:  05-03-2024 at 9:45 am  Morrie Sheldon  Sullivan Lone, BSN RN [facility], [facility] RN Care Manager Direct Dial: [phone]  Fax: [phone]

## 2024-04-08 ENCOUNTER — Other Ambulatory Visit: Payer: Self-pay | Admitting: Family

## 2024-04-08 DIAGNOSIS — E1142 Type 2 diabetes mellitus with diabetic polyneuropathy: Secondary | ICD-10-CM

## 2024-04-13 ENCOUNTER — Ambulatory Visit
Admission: RE | Admit: 2024-04-13 | Discharge: 2024-04-13 | Disposition: A | Discharge status: Home / Self Care | Source: Ambulatory Visit | Attending: Neurology | Admitting: Neurology

## 2024-04-13 DIAGNOSIS — R55 Syncope and collapse: Secondary | ICD-10-CM

## 2024-04-13 DIAGNOSIS — R251 Tremor, unspecified: Secondary | ICD-10-CM

## 2024-04-18 ENCOUNTER — Other Ambulatory Visit: Payer: Self-pay | Admitting: Family

## 2024-04-18 DIAGNOSIS — E785 Hyperlipidemia, unspecified: Secondary | ICD-10-CM

## 2024-04-22 ENCOUNTER — Other Ambulatory Visit: Payer: Self-pay | Admitting: Family

## 2024-04-22 DIAGNOSIS — K2100 Gastro-esophageal reflux disease with esophagitis, without bleeding: Secondary | ICD-10-CM

## 2024-05-03 ENCOUNTER — Other Ambulatory Visit: Payer: Self-pay | Admitting: *Deleted

## 2024-05-03 NOTE — Patient Instructions (Signed)
 Visit Information  Thank you for taking time to visit with me today. Please don't hesitate to contact me if I can be of assistance to you before our next scheduled appointment.  Your next care management appointment is by telephone on 05-31-2024 at 10:30 am  Telephone follow-up in 1 month  Please call the care guide team at [phone] if you need to cancel, schedule, or reschedule an appointment.   Please call the Suicide and Crisis Lifeline: 988 call the [place]  National Suicide Prevention Lifeline: 248-738-9259 or TTY: 2505406335 TTY (323) 290-9879) to talk to a trained counselor call 1-800-273-TALK (toll free, 24 hour hotline) call the [facility]: 272-092-2737 call 911 if you are experiencing a Mental Health or Behavioral Health Crisis or need someone to talk to.  Grandville Lax, BSN RN [place]  [facility], [facility] RN Care Manager Direct Dial : [phone]  Fax: [phone]  Carbohydrate Counting for Diabetes Mellitus, Adult Carbohydrate counting is a method of keeping track of how many carbohydrates you eat. Eating carbohydrates increases the amount of sugar (glucose) in the blood. Counting how many carbohydrates you eat improves how well you manage your blood glucose. This, in turn, helps you manage your diabetes. Carbohydrates are measured in grams (g) per serving. It is important to know how many carbohydrates (in grams or by serving size) you can have in each meal. This is different for every person. A dietitian can help you make a meal plan and calculate how many carbohydrates you should have at each meal and snack. What foods contain carbohydrates? Carbohydrates are found in the following foods: Grains, such as breads and cereals. Dried beans and soy products. Starchy vegetables, such as potatoes, peas, and corn. Fruit and fruit juices. Milk and yogurt. Sweets and snack foods, such as cake, cookies, candy, chips, and soft  drinks. How do I count carbohydrates in foods? There are two ways to count carbohydrates in food. You can read food labels or learn standard serving sizes of foods. You can use either of these methods or a combination of both. Using the Nutrition Facts label The Nutrition Facts list is included on the labels of almost all packaged foods and beverages in the [place] . It includes: The serving size. Information about nutrients in each serving, including the grams of carbohydrate per serving. To use the Nutrition Facts, decide how many servings you will have. Then, multiply the number of servings by the number of carbohydrates per serving. The resulting number is the total grams of carbohydrates that you will be having. Learning the standard serving sizes of foods When you eat carbohydrate foods that are not packaged or do not include Nutrition Facts on the label, you need to measure the servings in order to count the grams of carbohydrates. Measure the foods that you will eat with a food scale or measuring cup, if needed. Decide how many standard-size servings you will eat. Multiply the number of servings by 15. For foods that contain carbohydrates, one serving equals 15 g of carbohydrates. For example, if you eat 2 cups or 10 oz (300 g) of strawberries, you will have eaten 2 servings and 30 g of carbohydrates (2 servings x 15 g = 30 g). For foods that have more than one food mixed, such as soups and casseroles, you must count the carbohydrates in each food that is included. The following list contains standard serving sizes of common carbohydrate-rich foods. Each of these servings has about 15 g of carbohydrates: 1  slice of bread. 1 six-inch (15 cm) tortilla. ? cup or 2 oz (53 g) cooked rice or pasta.  cup or 3 oz (85 g) cooked or canned, drained and rinsed beans or lentils.  cup or 3 oz (85 g) starchy vegetable, such as peas, corn, or squash.  cup or 4 oz (120 g) hot cereal.  cup or 3  oz (85 g) boiled or mashed potatoes, or  or 3 oz (85 g) of a large baked potato.  cup or 4 fl oz (118 mL) fruit juice. 1 cup or 8 fl oz (237 mL) milk. 1 small or 4 oz (106 g) apple.  or 2 oz (63 g) of a medium banana. 1 cup or 5 oz (150 g) strawberries. 3 cups or 1 oz (28.3 g) popped popcorn. What is an example of carbohydrate counting? To calculate the grams of carbohydrates in this sample meal, follow the steps shown below. Sample meal 3 oz (85 g) chicken breast. ? cup or 4 oz (106 g) brown rice.  cup or 3 oz (85 g) corn. 1 cup or 8 fl oz (237 mL) milk. 1 cup or 5 oz (150 g) strawberries with sugar-free whipped topping. Carbohydrate calculation Identify the foods that contain carbohydrates: Rice. Corn. Milk. Strawberries. Calculate how many servings you have of each food: 2 servings rice. 1 serving corn. 1 serving milk. 1 serving strawberries. Multiply each number of servings by 15 g: 2 servings rice x 15 g = 30 g. 1 serving corn x 15 g = 15 g. 1 serving milk x 15 g = 15 g. 1 serving strawberries x 15 g = 15 g. Add together all of the amounts to find the total grams of carbohydrates eaten: 30 g + 15 g + 15 g + 15 g = 75 g of carbohydrates total. What are tips for following this plan? Shopping Develop a meal plan and then make a shopping list. Buy fresh and frozen vegetables, fresh and frozen fruit, dairy, eggs, beans, lentils, and whole grains. Look at food labels. Choose foods that have more fiber and less sugar. Avoid processed foods and foods with added sugars. Meal planning Aim to have the same number of grams of carbohydrates at each meal and for each snack time. Plan to have regular, balanced meals and snacks. Where to find more information American Diabetes Association: diabetes.org Centers for Disease Control and Prevention: TonerPromos.no Academy of Nutrition and Dietetics: eatright.org Association of Diabetes Care & Education Specialists:  diabeteseducator.org Summary Carbohydrate counting is a method of keeping track of how many carbohydrates you eat. Eating carbohydrates increases the amount of sugar (glucose) in your blood. Counting how many carbohydrates you eat improves how well you manage your blood glucose. This helps you manage your diabetes. A dietitian can help you make a meal plan and calculate how many carbohydrates you should have at each meal and snack. This information is not intended to replace advice given to you by your health care provider. Make sure you discuss any questions you have with your health care provider. Document Revised: 07/15/2020 Document Reviewed: 07/16/2020 Elsevier Patient Education  2024 [employer].

## 2024-05-03 NOTE — Patient Outreach (Signed)
 Complex Care Management   Visit Note  05/03/2024  Name:  Renee [name] MRN: [id number] DOB: 12/06/1951  Situation: Referral received for Complex Care Management related to Diabetes with Complications I obtained verbal consent from Patient.  Visit completed with patient  on the phone  Background:   Past Medical History:  Diagnosis Date   Arthritis    Breast cancer (HCC) 2001   right mastectomy   CVA (cerebral infarction) 2003, 2005   DM (diabetes mellitus) (HCC)    GERD (gastroesophageal reflux disease)    Heart murmur    Hyperlipidemia    Hypertension    Seizures (HCC)    Vitamin D  deficiency     Assessment: Patient Reported Symptoms:  Cognitive Cognitive Status: Alert and oriented to person, place, and time Cognitive/Intellectual Conditions Management [RPT]: None reported or documented in medical history or problem list   Health Maintenance Behaviors: Annual physical exam Healing Pattern: Average Health Facilitated by: Prayer/meditation, Rest  Neurological Neurological Review of Symptoms: No symptoms reported Neurological Self-Management Outcome: 4 (good)  HEENT HEENT Symptoms Reported: No symptoms reported HEENT Self-Management Outcome: 4 (good)    Cardiovascular Cardiovascular Symptoms Reported: No symptoms reported Does patient have uncontrolled Hypertension?: No Cardiovascular Conditions: Hypertension Cardiovascular Management Strategies: Medication therapy, Routine screening Cardiovascular Self-Management Outcome: 4 (good)  Respiratory Respiratory Symptoms Reported: No symptoms reported Respiratory Self-Management Outcome: 4 (good)  Endocrine Patient reports the following symptoms related to hypoglycemia or hyperglycemia : No symptoms reported Is patient diabetic?: Yes Is patient checking blood sugars at home?: Yes Endocrine Conditions: Diabetes Endocrine Management Strategies: Routine screening, Medication therapy, Diet modification Endocrine Self-Management  Outcome: 4 (good)  Gastrointestinal Gastrointestinal Symptoms Reported: No symptoms reported Gastrointestinal Conditions: Constipation Gastrointestinal Management Strategies: Medication therapy Gastrointestinal Self-Management Outcome: 4 (good) Nutrition Risk Screen (CP): No indicators present  Genitourinary Genitourinary Symptoms Reported: No symptoms reported Genitourinary Management Strategies: Incontinence garment/pad Genitourinary Self-Management Outcome: 4 (good)  Integumentary Integumentary Symptoms Reported: No symptoms reported Skin Self-Management Outcome: 4 (good)  Musculoskeletal Musculoskelatal Symptoms Reviewed: No symptoms reported Musculoskeletal Self-Management Outcome: 4 (good) Falls in the past year?: Yes Number of falls in past year: 1 or less Was there an injury with Fall?: Yes Fall Risk Category Calculator: 2 Patient Fall Risk Level: Moderate Fall Risk Patient at Risk for Falls Due to: History of fall(s) Fall risk Follow up: Falls evaluation completed, Falls prevention discussed  Psychosocial Psychosocial Symptoms Reported: No symptoms reported Behavioral Health Self-Management Outcome: 4 (good) Major Change/Loss/Stressor/Fears (CP): Denies Quality of Family Relationships: helpful, involved, supportive Do you feel physically threatened by others?: No      05/03/2024   10:11 AM  Depression screen PHQ 2/9  Decreased Interest 0  Down, Depressed, Hopeless 0  PHQ - 2 Score 0    Vitals:   05/03/24 0900  BP: (!) 113/57    Medications Reviewed Today     Reviewed by Remona Carmel, RN (Registered Nurse) on 05/03/24 at 1000  Med List Status: <None>   Medication Order Taking? Sig Documenting Provider Last Dose Status Informant  aspirin 81 MG tablet [id number] Yes Take 81 mg by mouth daily. [provider] Taking Active Self  atorvastatin  (LIPITOR) 40 MG tablet [id number] Yes TAKE 1 TABLET BY MOUTH DAILY Yevette Hem, FNP Taking Active   Blood  Glucose Calibration (ONETOUCH VERIO) SOLN [id number] Yes Use as needed to calibrate glucometer Eckard, Tammy, RPH-CPP Taking Active Self  Blood Glucose Monitoring Suppl (ONETOUCH VERIO FLEX SYSTEM) w/Device KIT [id number] Yes Use to check  blood sugar twice daily Tommas Fragmin A, FNP Taking Active   Cholecalciferol (VITAMIN D3) 10 MCG (400 UNIT) CAPS [id number] Yes Take by mouth. [provider] Taking Active   dapagliflozin  propanediol (FARXIGA ) 10 MG TABS tablet [id number] Yes Take 1 tablet (10 mg total) by mouth daily before breakfast. Tommas Fragmin A, FNP Taking Active   docusate sodium (COLACE) 100 MG capsule [id number] Yes Take 100 mg by mouth daily. [provider] Taking Active Self  gabapentin  (NEURONTIN ) 400 MG capsule [id number] Yes TAKE 1 CAPSULE BY MOUTH 3 TIMES  DAILY Hawks, Christy A, FNP Taking Active   glipiZIDE  (GLUCOTROL ) 10 MG tablet [id number] Yes TAKE 1 TABLET BY MOUTH TWICE  DAILY BEFORE A MEAL Hawks, Christy A, FNP Taking Active   glucose blood (ONETOUCH VERIO) test strip [id number] Yes CHECK BLOOD SUGAR TWICE DAILY Dx E11.9 Yevette Hem, FNP Taking Active   omeprazole  (PRILOSEC) 20 MG capsule [id number] Yes TAKE 1 CAPSULE BY MOUTH DAILY Yevette Hem, FNP Taking Active   [facility] DELICA LANCETS 33G MISC [id number] Yes Use to check BG up to bid.  Dx: E11.65 type 2 DM with insulin  therapy, uncontrolled Eckard, Tammy, RPH-CPP Taking Active Self  valsartan -hydrochlorothiazide  (DIOVAN -HCT) 160-25 MG tablet [id number] Yes TAKE 1 TABLET BY MOUTH DAILY Yevette Hem, FNP Taking Active             Recommendation:   PCP Follow-up  Follow Up Plan:   Telephone follow up appointment date/time:  05-31-2024 at 10:30 am  Grandville Lax, BSN RN [facility], [facility] RN Care Manager Direct Dial : [phone]  Fax: [phone]

## 2024-05-08 ENCOUNTER — Ambulatory Visit: Payer: Self-pay | Admitting: Neurology

## 2024-05-08 DIAGNOSIS — L84 Corns and callosities: Secondary | ICD-10-CM | POA: Diagnosis not present

## 2024-05-08 DIAGNOSIS — M79675 Pain in left toe(s): Secondary | ICD-10-CM | POA: Diagnosis not present

## 2024-05-08 DIAGNOSIS — B351 Tinea unguium: Secondary | ICD-10-CM | POA: Diagnosis not present

## 2024-05-08 DIAGNOSIS — M79674 Pain in right toe(s): Secondary | ICD-10-CM | POA: Diagnosis not present

## 2024-05-08 DIAGNOSIS — E1151 Type 2 diabetes mellitus with diabetic peripheral angiopathy without gangrene: Secondary | ICD-10-CM | POA: Diagnosis not present

## 2024-05-08 NOTE — Telephone Encounter (Signed)
-----   Message from Renee [name] sent at 05/08/2024  2:07 PM EDT ----- Pls let her know brain MRI looked good, no tumor, stroke, bleed, or inflammation seen. Thanks

## 2024-05-08 NOTE — Telephone Encounter (Signed)
 Pt called an informed brain MRI looked good, no tumor, stroke, bleed, or inflammation seen

## 2024-05-24 ENCOUNTER — Ambulatory Visit: Payer: Medicare Other | Admitting: Family

## 2024-05-24 ENCOUNTER — Encounter: Payer: Self-pay | Admitting: Family

## 2024-05-24 VITALS — BP 118/75 | HR 66 | Temp 97.9°F | Ht 66.0 in | Wt 160.0 lb

## 2024-05-24 DIAGNOSIS — Z7984 Long term (current) use of oral hypoglycemic drugs: Secondary | ICD-10-CM

## 2024-05-24 DIAGNOSIS — E1159 Type 2 diabetes mellitus with other circulatory complications: Secondary | ICD-10-CM | POA: Diagnosis not present

## 2024-05-24 DIAGNOSIS — E1169 Type 2 diabetes mellitus with other specified complication: Secondary | ICD-10-CM

## 2024-05-24 DIAGNOSIS — K2100 Gastro-esophageal reflux disease with esophagitis, without bleeding: Secondary | ICD-10-CM

## 2024-05-24 DIAGNOSIS — E1142 Type 2 diabetes mellitus with diabetic polyneuropathy: Secondary | ICD-10-CM | POA: Diagnosis not present

## 2024-05-24 DIAGNOSIS — I152 Hypertension secondary to endocrine disorders: Secondary | ICD-10-CM

## 2024-05-24 DIAGNOSIS — Z6825 Body mass index (BMI) 25.0-25.9, adult: Secondary | ICD-10-CM

## 2024-05-24 DIAGNOSIS — Z91199 Patient's noncompliance with other medical treatment and regimen due to unspecified reason: Secondary | ICD-10-CM

## 2024-05-24 DIAGNOSIS — Z8673 Personal history of transient ischemic attack (TIA), and cerebral infarction without residual deficits: Secondary | ICD-10-CM

## 2024-05-24 DIAGNOSIS — E785 Hyperlipidemia, unspecified: Secondary | ICD-10-CM

## 2024-05-24 DIAGNOSIS — E663 Overweight: Secondary | ICD-10-CM

## 2024-05-24 LAB — BAYER DCA HB A1C WAIVED: HB A1C (BAYER DCA - WAIVED): 10.7 % — ABNORMAL HIGH (ref 4.8–5.6)

## 2024-05-24 MED ORDER — GLIPIZIDE 10 MG PO TABS
10.0000 mg | ORAL_TABLET | Freq: Two times a day (BID) | ORAL | 4 refills | Status: AC
Start: 2024-05-24 — End: ?

## 2024-05-24 MED ORDER — OMEPRAZOLE 20 MG PO CPDR
20.0000 mg | DELAYED_RELEASE_CAPSULE | Freq: Every day | ORAL | 4 refills | Status: AC
Start: 2024-05-24 — End: ?

## 2024-05-24 MED ORDER — ATORVASTATIN CALCIUM 40 MG PO TABS: 40.0000 mg | ORAL_TABLET | Freq: Every day | ORAL | 4 refills | Status: DC

## 2024-05-24 MED ORDER — GABAPENTIN 400 MG PO CAPS
400.0000 mg | ORAL_CAPSULE | Freq: Three times a day (TID) | ORAL | 4 refills | Status: AC
Start: 2024-05-24 — End: ?

## 2024-05-24 MED ORDER — VALSARTAN-HYDROCHLOROTHIAZIDE 160-25 MG PO TABS
1.0000 | ORAL_TABLET | Freq: Every day | ORAL | 3 refills | Status: AC
Start: 2024-05-24 — End: ?

## 2024-05-24 NOTE — Progress Notes (Signed)
 Subjective:    Patient ID: Renee [name], female    DOB: 11/14/1951, 73 y.o.   MRN: [id number]  Chief Complaint  Patient presents with   Medical Management of Chronic Issues   Pt presents to the office today for chronic follow up.    Her DM is uncontrolled and is followed by Podiatry every 8 weeks. She has seen Endocrinologists, but states she has not seen them in awhile because of transportation. Her last A1C was 10.0.   She refuses any medication changes because she is scared of side effects. She has stopped her Januvia  because it was "not working".    She has diabetic neuropathy and takes gabapentin  400 mg TID. States her pain is an aching, burning  pain of 2 out 10 with medications.    Has hx of CVA.  Hypertension This is a chronic problem. The current episode started more than 1 year ago. The problem has been resolved since onset. The problem is controlled. Associated symptoms include blurred vision. Pertinent negatives include no malaise/fatigue, peripheral edema or shortness of breath. Risk factors for coronary artery disease include dyslipidemia, diabetes mellitus and sedentary lifestyle. The current treatment provides moderate improvement. Hypertensive end-organ damage includes CVA.  Gastroesophageal Reflux She complains of belching and heartburn. This is a chronic problem. The current episode started more than 1 year ago. The problem occurs occasionally. The symptoms are aggravated by certain foods. She has tried a PPI for the symptoms. The treatment provided moderate relief.  Hyperlipidemia This is a chronic problem. The current episode started more than 1 year ago. The problem is uncontrolled. Recent lipid tests were reviewed and are high. Exacerbating diseases include obesity. Pertinent negatives include no shortness of breath. Current antihyperlipidemic treatment includes statins. The current treatment provides moderate improvement of lipids. Risk factors for coronary artery  disease include dyslipidemia, diabetes mellitus, hypertension, a sedentary lifestyle and post-menopausal.  Diabetes She presents for her follow-up diabetic visit. She has type 2 diabetes mellitus. Associated symptoms include blurred vision and foot paresthesias. Symptoms are stable. Diabetic complications include a CVA and peripheral neuropathy. Risk factors for coronary artery disease include dyslipidemia, diabetes mellitus, hypertension, sedentary lifestyle and post-menopausal. She is following a generally unhealthy diet. Her overall blood glucose range is 180-200 mg/dl.      Review of Systems  Constitutional:  Negative for malaise/fatigue.  Eyes:  Positive for blurred vision.  Respiratory:  Negative for shortness of breath.   Gastrointestinal:  Positive for heartburn.  All other systems reviewed and are negative.  Family History  Problem Relation Age of Onset   Alzheimer's disease Mother    Parkinson's disease Mother    Heart Problems Father    Kidney disease Father        +dialysis   Heart disease Father    Other Sister        +TAH for fibroids   Breast cancer Sister        dx. 55-56   Renal cancer Sister        dx. 62-63; not a smoker   Early death Brother    Diabetes Brother    Cancer Paternal Aunt        NOS cancer   Colon cancer Neg Hx    Stomach cancer Neg Hx    Social History   Socioeconomic History   Marital status: Single    Spouse name: Not on file   Number of children: Not on file   Years of education: 11  Highest education level: Associate degree: academic program  Occupational History   Occupation: Retired    Associate Professor: [employer]    Comment: [employer]   Tobacco Use   Smoking status: Never   Smokeless tobacco: Never  Vaping Use   Vaping status: Never Used  Substance and Sexual Activity   Alcohol use: Not Currently    Alcohol/week: 1.0 standard drink of alcohol    Types: 1 Glasses of wine per week   Drug  use: No   Sexual activity: Not Currently    Birth control/protection: Post-menopausal  Other Topics Concern   Not on file  Social History Narrative   Retired from school system, single, no children. Enjoys reading and word puzzles.    Jehovah's Witness - goes to [employer] get together [place] Abed, Saturdays   Her parents left their home to her and her sister   Social Drivers of [employer]: Low Risk  (07/25/2023)   Overall Financial Resource Strain (CARDIA)    Difficulty of Paying Living Expenses: Not hard at all  Food Insecurity: No Food Insecurity (05/03/2024)   Hunger Vital Sign    Worried About Running Out of Food in the Last Year: Never true    Ran Out of Food in the Last Year: Never true  Transportation Needs: No Transportation Needs (05/03/2024)   PRAPARE - Administrator, Civil Service (Medical): No    Lack of Transportation (Non-Medical): No  Physical Activity: Insufficiently Active (07/25/2023)   Exercise Vital Sign    Days of Exercise per Week: 3 days    Minutes of Exercise per Session: 30 min  Stress: No Stress Concern Present (07/25/2023)   [employer] of Occupational Health - Occupational Stress Questionnaire    Feeling of Stress : Not at all  Social Connections: Moderately Isolated (07/25/2023)   Social Connection and Isolation Panel [NHANES]    Frequency of Communication with Friends and Family: More than three times a week    Frequency of Social Gatherings with Friends and Family: More than three times a week    Attends Religious Services: More than 4 times per year    Active Member of [employer] or Organizations: No    Attends Banker Meetings: Never    Marital Status: Never married       Objective:   Physical Exam Vitals reviewed.  Constitutional:      General: She is not in acute distress.    Appearance: She is well-developed.  HENT:     Head: Normocephalic and atraumatic.     Right Ear: Tympanic  membrane normal.     Left Ear: Tympanic membrane normal.  Eyes:     Pupils: Pupils are equal, round, and reactive to light.  Neck:     Thyroid : No thyromegaly.  Cardiovascular:     Rate and Rhythm: Normal rate and regular rhythm.     Heart sounds: Normal heart sounds. No murmur heard. Pulmonary:     Effort: Pulmonary effort is normal. No respiratory distress.     Breath sounds: Normal breath sounds. No wheezing.  Abdominal:     General: Bowel sounds are normal. There is no distension.     Palpations: Abdomen is soft.     Tenderness: There is no abdominal tenderness.  Musculoskeletal:        General: No tenderness. Normal range of motion.     Cervical back: Normal range of motion and  neck supple.  Skin:    General: Skin is warm and dry.  Neurological:     Mental Status: She is alert and oriented to person, place, and time.     Cranial Nerves: No cranial nerve deficit.     Deep Tendon Reflexes: Reflexes are normal and symmetric.  Psychiatric:        Behavior: Behavior normal.        Thought Content: Thought content normal.        Judgment: Judgment normal.       BP 118/75   Pulse 66   Temp 97.9 F (36.6 C) (Temporal)   Ht 5\' 6"  (1.676 m)   Wt 160 lb (72.6 kg)   SpO2 98%   BMI 25.82 kg/m      Assessment & Plan:  LUVERN MISCHKE comes in today with chief complaint of Medical Management of Chronic Issues   Diagnosis and orders addressed:  1. Hypertension associated with diabetes (HCC) (Primary) - valsartan -hydrochlorothiazide  (DIOVAN -HCT) 160-25 MG tablet; Take 1 tablet by mouth daily.  Dispense: 100 tablet; Refill: 3  2. Type 2 diabetes mellitus with diabetic polyneuropathy, without long-term current use of insulin  (HCC) - Bayer DCA Hb A1c Waived - CMP14+EGFR - glipiZIDE  (GLUCOTROL ) 10 MG tablet; Take 1 tablet (10 mg total) by mouth 2 (two) times daily before a meal.  Dispense: 200 tablet; Refill: 4  3. Hyperlipidemia associated with type 2 diabetes mellitus  (HCC) - atorvastatin  (LIPITOR) 40 MG tablet; Take 1 tablet (40 mg total) by mouth daily.  Dispense: 100 tablet; Refill: 4  4. Diabetic peripheral neuropathy (HCC) - gabapentin  (NEURONTIN ) 400 MG capsule; Take 1 capsule (400 mg total) by mouth 3 (three) times daily.  Dispense: 300 capsule; Refill: 4  5. Gastroesophageal reflux disease with esophagitis without hemorrhage  - omeprazole  (PRILOSEC) 20 MG capsule; Take 1 capsule (20 mg total) by mouth daily.  Dispense: 100 capsule; Refill: 4  6. History of CVA (cerebrovascular accident)  7. Overweight (BMI 25.0-29.9)  8. Non-compliance A1C elevated, but refuses any medications changes because she is scared of adverse effects.    Labs pending Strict low carb diet  Continue medications, pt refuses any medication changes at this time.  Health Maintenance reviewed- Refuses all vaccines Diet and exercise encouraged  Follow up plan: 4 months    Tommas Fragmin, FNP

## 2024-05-24 NOTE — Patient Instructions (Signed)

## 2024-05-25 ENCOUNTER — Ambulatory Visit: Payer: Self-pay | Admitting: Family

## 2024-05-25 ENCOUNTER — Ambulatory Visit: Payer: Self-pay

## 2024-05-25 LAB — CMP14+EGFR
ALT: 20 IU/L (ref 0–32)
AST: 23 IU/L (ref 0–40)
Albumin: 4.7 g/dL (ref 3.8–4.8)
Alkaline Phosphatase: 95 IU/L (ref 44–121)
BUN/Creatinine Ratio: 27 (ref 12–28)
BUN: 38 mg/dL — ABNORMAL HIGH (ref 8–27)
Bilirubin Total: 0.4 mg/dL (ref 0.0–1.2)
CO2: 19 mmol/L — ABNORMAL LOW (ref 20–29)
Calcium: 10.9 mg/dL — ABNORMAL HIGH (ref 8.7–10.3)
Chloride: 102 mmol/L (ref 96–106)
Creatinine, Ser: 1.39 mg/dL — ABNORMAL HIGH (ref 0.57–1.00)
Globulin, Total: 3 g/dL (ref 1.5–4.5)
Glucose: 127 mg/dL — ABNORMAL HIGH (ref 70–99)
Potassium: 4.4 mmol/L (ref 3.5–5.2)
Sodium: 140 mmol/L (ref 134–144)
Total Protein: 7.7 g/dL (ref 6.0–8.5)
eGFR: 40 mL/min/{1.73_m2} — ABNORMAL LOW (ref >59)

## 2024-05-25 NOTE — Telephone Encounter (Signed)
 Copied from CRM [id number]. Topic: Clinical - Lab/Test Results >> May 25, 2024  2:30 PM Baldomero Bone wrote: Reason for CRM: Patient is returning a call regarding labs.  Chief Complaint: Lab Results  Disposition: [] ED /[] Urgent Care (no appt availability in office) / [] Appointment(In office/virtual)/ []  [facility]/ [] Home Care/ [] Refused Recommended Disposition /[] [facility]/ [x]  Follow-up with PCP  Additional Notes: SD is calling in regarding lab results and questions regarding the medications. The patient is requesting a call regarding what the next steps are regarding treatment and adjustments. The patient is adamant about staying on oral medications.   [phone]  Reason for Disposition  Caller requesting lab results  (Exception: Routine or non-urgent lab result.)  Answer Assessment - Initial Assessment Questions 1. REASON FOR CALL or QUESTION: "What is your reason for calling today?" or "How can I best help you?" or "What question do you have that I can help answer?"     Lab Results  2. CALLER: Document the source of call. (e.g., laboratory, patient).     Patient  Protocols used: PCP Call - No Triage-A-AH

## 2024-05-25 NOTE — Telephone Encounter (Signed)
 Refer to labs

## 2024-05-31 ENCOUNTER — Other Ambulatory Visit: Payer: Self-pay | Admitting: *Deleted

## 2024-05-31 NOTE — Patient Instructions (Signed)
 Visit Information  Thank you for taking time to visit with me today. Please don't hesitate to contact me if I can be of assistance to you before our next scheduled appointment.  Your next care management appointment is by telephone on 06/28/2024 at 10:30 am  Telephone follow-up in 1 month  Please call the care guide team at [phone] if you need to cancel, schedule, or reschedule an appointment.   Please call the Suicide and Crisis Lifeline: 988 call the [place]  National Suicide Prevention Lifeline: 9785725253 or TTY: 4018006339 TTY 301-065-6568) to talk to a trained counselor call 1-800-273-TALK (toll free, 24 hour hotline) call the [facility]: (778)732-8460 call 911 if you are experiencing a Mental Health or Behavioral Health Crisis or need someone to talk to.  Gilberto Labella, MSN, RN [facility], [facility] RN Care Manager Direct Dial : [phone] Fax: [phone]

## 2024-05-31 NOTE — Patient Outreach (Signed)
 Complex Care Management   Visit Note  05/31/2024  Name:  Renee [name] MRN: [id number] DOB: January 20, 1951  Situation: Referral received for Complex Care Management related to Diabetes with Complications and HTN I obtained verbal consent from Patient.  Visit completed with Patient  on the phone  Background:   Past Medical History:  Diagnosis Date   Arthritis    Breast cancer (HCC) 2001   right mastectomy   CVA (cerebral infarction) 2003, 2005   DM (diabetes mellitus) (HCC)    GERD (gastroesophageal reflux disease)    Heart murmur    Hyperlipidemia    Hypertension    Seizures (HCC)    Vitamin D  deficiency     Assessment: Patient Reported Symptoms:  Cognitive Cognitive Status: Alert and oriented to person, place, and time Cognitive/Intellectual Conditions Management [RPT]: None reported or documented in medical history or problem list   Health Maintenance Behaviors: Annual physical exam Healing Pattern: Average Health Facilitated by: Prayer/meditation  Neurological Neurological Review of Symptoms: No symptoms reported    HEENT HEENT Symptoms Reported: No symptoms reported      Cardiovascular Cardiovascular Symptoms Reported: No symptoms reported Does patient have uncontrolled Hypertension?: No Cardiovascular Conditions: Hypertension Cardiovascular Management Strategies: Medication therapy  Respiratory Respiratory Symptoms Reported: No symptoms reported    Endocrine Patient reports the following symptoms related to hypoglycemia or hyperglycemia : No symptoms reported Is patient diabetic?: Yes Is patient checking blood sugars at home?: Yes Endocrine Conditions: Diabetes  Gastrointestinal Gastrointestinal Symptoms Reported: No symptoms reported Gastrointestinal Conditions: Constipation Gastrointestinal Management Strategies: Medication therapy Nutrition Risk Screen (CP): Unintentional loss of 10 lbs or more in the past 2 months (Patient states hungry on diet advised and  losing weight.)  Genitourinary Genitourinary Symptoms Reported: No symptoms reported    Integumentary Integumentary Symptoms Reported: No symptoms reported    Musculoskeletal Musculoskelatal Symptoms Reviewed: No symptoms reported   Falls in the past year?: Yes Number of falls in past year: 1 or less Was there an injury with Fall?: No Fall Risk Category Calculator: 1 Patient Fall Risk Level: Low Fall Risk Patient at Risk for Falls Due to: History of fall(s) Fall risk Follow up: Falls evaluation completed  Psychosocial Psychosocial Symptoms Reported: No symptoms reported   Major Change/Loss/Stressor/Fears (CP): Denies Quality of Family Relationships: helpful, supportive, involved Do you feel physically threatened by others?: No      05/31/2024   10:54 AM  Depression screen PHQ 2/9  Decreased Interest 0  Down, Depressed, Hopeless 0  PHQ - 2 Score 0    Vitals:   05/31/24 1050  BP: 118/75  Pulse: 66    Medications Reviewed Today     Reviewed by Gilberto Labella, RN (Registered Nurse) on 05/31/24 at 1107  Med List Status: <None>   Medication Order Taking? Sig Documenting Provider Last Dose Status Informant  aspirin 81 MG tablet [id number] Yes Take 81 mg by mouth daily. [provider] Taking Active Self  atorvastatin  (LIPITOR) 40 MG tablet [id number] Yes Take 1 tablet (40 mg total) by mouth daily. Yevette Hem, FNP Taking Active   Blood Glucose Calibration (ONETOUCH VERIO) Leonard Raker [id number] Yes Use as needed to calibrate glucometer Eckard, Tammy, RPH-CPP Taking Active Self  Blood Glucose Monitoring Suppl (ONETOUCH VERIO FLEX SYSTEM) w/Device KIT [id number] Yes Use to check blood sugar twice daily Tommas Fragmin A, FNP Taking Active   Cholecalciferol (VITAMIN D3) 10 MCG (400 UNIT) CAPS [id number] Yes Take by mouth. [provider] Taking Active   dapagliflozin  propanediol (  FARXIGA ) 10 MG TABS tablet [id number] Yes Take 1 tablet (10 mg total) by mouth daily before  breakfast. Yevette Hem, FNP Taking Active   docusate sodium (COLACE) 100 MG capsule [id number] Yes Take 100 mg by mouth daily. [provider] Taking Active Self  gabapentin  (NEURONTIN ) 400 MG capsule [id number] Yes Take 1 capsule (400 mg total) by mouth 3 (three) times daily. Yevette Hem, FNP Taking Active   glipiZIDE  (GLUCOTROL ) 10 MG tablet [id number] Yes Take 1 tablet (10 mg total) by mouth 2 (two) times daily before a meal. Yevette Hem, FNP Taking Active   glucose blood (ONETOUCH VERIO) test strip [id number] Yes CHECK BLOOD SUGAR TWICE DAILY Dx E11.9 Yevette Hem, FNP Taking Active   omeprazole  (PRILOSEC) 20 MG capsule [id number] Yes Take 1 capsule (20 mg total) by mouth daily. Yevette Hem, FNP Taking Active   [facility] LANCETS 33G MISC [id number] Yes Use to check BG up to bid.  Dx: E11.65 type 2 DM with insulin  therapy, uncontrolled Eckard, Tammy, RPH-CPP Taking Active Self  valsartan -hydrochlorothiazide  (DIOVAN -HCT) 160-25 MG tablet [id number] Yes Take 1 tablet by mouth daily. Yevette Hem, FNP Taking Active             Recommendation:   PCP Follow-up; Patient would like to discuss increasing dosage of diabetes medications. Refuses insulin .  Continue Current Plan of Care  Follow Up Plan:   Telephone follow-up in 1 month   Gilberto Labella, MSN, RN [facility]  [facility], [facility] RN Care Manager Direct Dial : [phone] Fax: [phone]

## 2024-06-13 ENCOUNTER — Telehealth: Payer: Self-pay

## 2024-06-13 ENCOUNTER — Telehealth: Payer: Self-pay | Admitting: Family

## 2024-06-13 NOTE — Telephone Encounter (Signed)
 Patient states she is going to hold off on eye exam for now. She knows she needs to get it done. She states she is busy at this time and will schedule when things settle down.

## 2024-06-13 NOTE — Telephone Encounter (Signed)
 Lmtcb to schedule diabetic eye exam

## 2024-06-13 NOTE — Telephone Encounter (Signed)
 Copied from CRM [id number]. Topic: Appointments - Scheduling Inquiry for Clinic >> Jun 13, 2024 12:58 PM [name] wrote: Reason for CRM: Pt called in about her diabetic eye exam and said she was left a voicemail saying she needed to schedule.    Pt said she doesn't have time and said it was optional and declined to schedule.    Pt would like a follow up [phone]

## 2024-06-21 ENCOUNTER — Telehealth: Payer: Self-pay | Admitting: Pharmacy Technician

## 2024-06-21 NOTE — Progress Notes (Addendum)
   06/21/2024 Name: Renee [name] MRN: [id number] DOB: December 27, 1951  Patient is appearing on a report for True [place] Metric Diabetes and last engaged with the clinical pharmacist to discuss diabetes on unknown date. Contacted patient today to discuss diabetes management and completed medication review.   Diabetes Plan from last clinical pharmacist appointment:  Patient was identified as falling into the [place] Measure - Diabetes. A1c on 01/17/24 was 10% and holding since 2024.Patient was: Left voicemail to schedule with primary care provider.  Referred to pharmacy for chronic disease management.  (copy/paste from last note)   Medication Adherence Barriers Identified:  Patient made recommended medication changes per plan: Yes  Access issues with any new medication or testing device: No  Patient is checking blood sugars as prescribed: Yes  Patient informs she is taking Glipizide  10mg  bid and Farxiga  10mg  every day. She informs she has both medications on hand.Patient receives Farxiga  thru PAP. Per Dr Annemarie, patient last fill Glipizide  on 03/28/24 for 100 days supply. Patient informs she checks blood sugar once a day in the morning before breakfast. She informs they range anywhere between 100-200. No extreme highs or lows were reported.  Medication Adherence Barriers Addressed/Actions Taken:  Reviewed medication changes per plan from last clinical pharmacist note Medication Access for Farxiga  Patient Assistance Program approved thru 12/26/24  Reviewed instructions for monitoring blood sugars at home and reminded patient to keep a written log to review with pharmacist Reminded patient of date/time of upcoming clinical pharmacist follow up and any upcoming PCP/specialists visits. Patient denies transportation barriers to the appointment. Yes  Patient informs she has a niece who takes her to the appointments in [place] and she catches the bus for her appointments in [place].  Next clinical  pharmacist appointment is scheduled for: TBD   Kate Caddy, CPhT [facility] Office: [phone] Email: [email or web address]

## 2024-06-28 ENCOUNTER — Other Ambulatory Visit: Payer: Self-pay | Admitting: *Deleted

## 2024-06-28 NOTE — Patient Outreach (Signed)
 Complex Care Management   Visit Note  06/28/2024  Name:  Renee [name] MRN: [id number] DOB: 13-Jul-1951  Situation: Referral received for Complex Care Management related to Diabetes with Complications I obtained verbal consent from Patient.  Visit completed with patient  on the phone  Background:   Past Medical History:  Diagnosis Date   Arthritis    Breast cancer (HCC) 2001   right mastectomy   CVA (cerebral infarction) 2003, 2005   DM (diabetes mellitus) (HCC)    GERD (gastroesophageal reflux disease)    Heart murmur    Hyperlipidemia    Hypertension    Seizures (HCC)    Vitamin D  deficiency     Assessment: Patient Reported Symptoms:  Cognitive Cognitive Status: No symptoms reported Cognitive/Intellectual Conditions Management [RPT]: None reported or documented in medical history or problem list   Health Maintenance Behaviors: Annual physical exam Healing Pattern: Average Health Facilitated by: Rest, Stress management, Prayer/meditation  Neurological Neurological Review of Symptoms: No symptoms reported Neurological Management Strategies: Coping strategies Neurological Self-Management Outcome: 4 (good)  HEENT HEENT Symptoms Reported: No symptoms reported HEENT Management Strategies: Routine screening HEENT Self-Management Outcome: 4 (good)    Cardiovascular Cardiovascular Symptoms Reported: No symptoms reported Does patient have uncontrolled Hypertension?: No Cardiovascular Management Strategies: Medication therapy, Diet modification, Routine screening, Exercise Cardiovascular Self-Management Outcome: 4 (good)  Respiratory Respiratory Symptoms Reported: No symptoms reported Respiratory Self-Management Outcome: 4 (good)  Endocrine Endocrine Symptoms Reported: No symptoms reported Is patient diabetic?: Yes Is patient checking blood sugars at home?: Yes List most recent blood sugar readings, include date and time of day: Today fasting: 205 Endocrine Self-Management  Outcome: 4 (good)  Gastrointestinal Gastrointestinal Symptoms Reported: No symptoms reported Gastrointestinal Self-Management Outcome: 4 (good) Nutrition Risk Screen (CP): No indicators present  Genitourinary Genitourinary Symptoms Reported: No symptoms reported Genitourinary Self-Management Outcome: 4 (good)  Integumentary Integumentary Symptoms Reported: No symptoms reported Skin Self-Management Outcome: 4 (good)  Musculoskeletal Musculoskelatal Symptoms Reviewed: No symptoms reported Musculoskeletal Management Strategies: Routine screening Falls in the past year?: Yes Number of falls in past year: 1 or less Was there an injury with Fall?: No Fall Risk Category Calculator: 1 Patient Fall Risk Level: Low Fall Risk Patient at Risk for Falls Due to: History of fall(s) Fall risk Follow up: Falls evaluation completed  Psychosocial Psychosocial Symptoms Reported: No symptoms reported Behavioral Health Self-Management Outcome: 4 (good) Major Change/Loss/Stressor/Fears (CP): Denies Quality of Family Relationships: helpful, involved, supportive Do you feel physically threatened by others?: No      06/28/2024   10:48 AM  Depression screen PHQ 2/9  Decreased Interest 0  Down, Depressed, Hopeless 0  PHQ - 2 Score 0    Vitals:   06/28/24 1041  BP: 111/62  Pulse: 82    Medications Reviewed Today     Reviewed by Bertrum Rosina HERO, RN (Registered Nurse) on 06/28/24 at 1042  Med List Status: <None>   Medication Order Taking? Sig Documenting Provider Last Dose Status Informant  aspirin 81 MG tablet [id number] Yes Take 81 mg by mouth daily. [provider]  Active Self  atorvastatin  (LIPITOR) 40 MG tablet [id number] Yes Take 1 tablet (40 mg total) by mouth daily. Lavell Bari LABOR, FNP  Active   Blood Glucose Calibration (ONETOUCH VERIO) LARRAINE [id number] Yes Use as needed to calibrate glucometer Eckard, Tammy, RPH-CPP  Active Self  Blood Glucose Monitoring Suppl (ONETOUCH VERIO FLEX  SYSTEM) w/Device KIT [id number] Yes Use to check blood sugar twice daily Lavell Bari LABOR, FNP  Active   Cholecalciferol (VITAMIN D3) 10 MCG (400 UNIT) CAPS [id number] Yes Take by mouth. [provider]  Active   dapagliflozin  propanediol (FARXIGA ) 10 MG TABS tablet [id number] Yes Take 1 tablet (10 mg total) by mouth daily before breakfast. Lavell Lye A, FNP  Active   docusate sodium (COLACE) 100 MG capsule [id number] Yes Take 100 mg by mouth daily. [provider]  Active Self  gabapentin  (NEURONTIN ) 400 MG capsule [id number] Yes Take 1 capsule (400 mg total) by mouth 3 (three) times daily. Lavell Lye A, FNP  Active   glipiZIDE  (GLUCOTROL ) 10 MG tablet [id number] Yes Take 1 tablet (10 mg total) by mouth 2 (two) times daily before a meal. Lavell Lye A, FNP  Active   glucose blood (ONETOUCH VERIO) test strip [id number] Yes CHECK BLOOD SUGAR TWICE DAILY Dx E11.9 Lavell Lye A, FNP  Active   omeprazole  (PRILOSEC) 20 MG capsule [id number] Yes Take 1 capsule (20 mg total) by mouth daily. Lavell Lye LABOR, FNP  Active   [facility] DELICA LANCETS 33G MISC [id number] Yes Use to check BG up to bid.  Dx: E11.65 type 2 DM with insulin  therapy, uncontrolled Eckard, Tammy, RPH-CPP  Active Self  valsartan -hydrochlorothiazide  (DIOVAN -HCT) 160-25 MG tablet [id number] Yes Take 1 tablet by mouth daily. Lavell Lye LABOR, FNP  Active             Recommendation:   Continue Current Plan of Care  Follow Up Plan:   Telephone follow-up in 1 month  Rosina Forte, BSN RN [facility], [facility] RN Care Manager Direct Dial : [phone]  Fax: [phone]

## 2024-06-28 NOTE — Patient Instructions (Signed)
 Visit Information  Thank you for taking time to visit with me today. Please don't hesitate to contact me if I can be of assistance to you before our next scheduled appointment.  Your next care management appointment is by telephone on 08-03-2024 at 10:30 am  Telephone follow-up in 1 month  Please call the care guide team at [phone] if you need to cancel, schedule, or reschedule an appointment.   Please call the Suicide and Crisis Lifeline: 988 call the [place]  National Suicide Prevention Lifeline: 651-181-5083 or TTY: 435-082-4212 TTY (306)502-7245) to talk to a trained counselor call 1-800-273-TALK (toll free, 24 hour hotline) call the [facility]: 437-107-0352 call 911 if you are experiencing a Mental Health or Behavioral Health Crisis or need someone to talk to.  Rosina Forte, BSN RN [facility], [facility] RN Care Manager Direct Dial : [phone]  Fax: [phone]

## 2024-07-10 ENCOUNTER — Other Ambulatory Visit: Payer: Self-pay | Admitting: *Deleted

## 2024-07-10 MED ORDER — ACCU-CHEK SOFTCLIX LANCETS MISC: 3 refills | Status: DC

## 2024-07-10 MED ORDER — ACCU-CHEK GUIDE ME W/DEVICE KIT: PACK | 0 refills | Status: DC

## 2024-07-10 MED ORDER — ACCU-CHEK GUIDE TEST VI STRP: ORAL_STRIP | 3 refills | Status: DC

## 2024-07-10 NOTE — Telephone Encounter (Signed)
 Ax from [facility] [place] Ins will no longer cover One Touch products Needs new scripts for Accu-check Scripts sent to pharmacy

## 2024-07-17 DIAGNOSIS — M79674 Pain in right toe(s): Secondary | ICD-10-CM | POA: Diagnosis not present

## 2024-07-17 DIAGNOSIS — E1151 Type 2 diabetes mellitus with diabetic peripheral angiopathy without gangrene: Secondary | ICD-10-CM | POA: Diagnosis not present

## 2024-07-17 DIAGNOSIS — L84 Corns and callosities: Secondary | ICD-10-CM | POA: Diagnosis not present

## 2024-07-17 DIAGNOSIS — M79675 Pain in left toe(s): Secondary | ICD-10-CM | POA: Diagnosis not present

## 2024-07-17 DIAGNOSIS — B351 Tinea unguium: Secondary | ICD-10-CM | POA: Diagnosis not present

## 2024-07-19 ENCOUNTER — Other Ambulatory Visit

## 2024-07-19 ENCOUNTER — Telehealth: Payer: Self-pay | Admitting: Pharmacist

## 2024-07-19 ENCOUNTER — Telehealth: Payer: Self-pay | Admitting: Pharmacy Technician

## 2024-07-19 DIAGNOSIS — E1142 Type 2 diabetes mellitus with diabetic polyneuropathy: Secondary | ICD-10-CM

## 2024-07-19 DIAGNOSIS — E119 Type 2 diabetes mellitus without complications: Secondary | ICD-10-CM

## 2024-07-19 DIAGNOSIS — Z7984 Long term (current) use of oral hypoglycemic drugs: Secondary | ICD-10-CM

## 2024-07-19 DIAGNOSIS — E1169 Type 2 diabetes mellitus with other specified complication: Secondary | ICD-10-CM

## 2024-07-19 NOTE — Telephone Encounter (Signed)
 Please send me re-enrollment for Farxiga   [facility] PAP Thank you!

## 2024-07-19 NOTE — Progress Notes (Signed)
 07/19/2024 Name: Renee [name] MRN: [id number] DOB: 1951/06/03  Chief Complaint  Patient presents with   Diabetes    Renee [name] is a 73 y.o. year old female who presented for a telephone visit. I connected with  Renee [name] on 07/19/24 by telephone and verified that I am speaking with the correct person using two identifiers. I discussed the limitations of evaluation and management by telemedicine. The patient expressed understanding and agreed to proceed.  Patient was located in her home and PharmD in PCP office during this visit.    They were referred to the pharmacist by their PCP for assistance in managing diabetes, medication access, and complex medication management.    Subjective:  Patient reports she is doing okay, but her blood sugars remain uncontrolled.  She has respectfully declined any future medications for her type 2 diabetes.  Care Team: Primary Care Provider: Lavell Bari LABOR, FNP    Medication Access/Adherence  Current Pharmacy:  [facility]) - [place], [place] - [address] [facility] [address] [place] [address] [place] [address] Phone: [phone] Fax: [phone]  [facility] [address], [place] - [address] [place] [address] Phone: [phone] Fax: [phone]  [facility] - [place], [place] - [address] [place] [address] Phone: [phone] Fax: [phone]   Patient reports affordability concerns with their medications: Yes  Patient reports access/transportation concerns to their pharmacy: No  Patient reports adherence concerns with their medications:  No     Diabetes:  Current medications: glipizide , Farxiga  Medications tried in the past: metformin, Rybelsus , Januvia  (all stopped due to GI and cost) She was prescribed Trulicity  03/216  declined to start due to concerns with possible side  effects.  Current glucose readings: FBG<180, improved control needed Using accuchek GUIDE meter; testing occasionally   Patient denies hypoglycemic s/sx including dizziness, shakiness, sweating. Patient denies hyperglycemic symptoms including polyuria, polydipsia, polyphagia, nocturia, neuropathy, blurred vision.  Current meal patterns:  Discussed meal planning options and Plate method for healthy eating Avoid sugary drinks and desserts Incorporate balanced protein, non starchy veggies, 1 serving of carbohydrate with each meal Increase water intake Increase physical activity as able  Current physical activity: encouraged as able   Current medication access support: medicare   Objective:  Lab Results  Component Value Date   HGBA1C 10.7 (H) 05/24/2024    Lab Results  Component Value Date   CREATININE 1.39 (H) 05/24/2024   BUN 38 (H) 05/24/2024   NA 140 05/24/2024   K 4.4 05/24/2024   CL 102 05/24/2024   CO2 19 (L) 05/24/2024    Lab Results  Component Value Date   CHOL 178 09/27/2023   HDL 49 09/27/2023   LDLCALC 103 (H) 09/27/2023   TRIG 145 09/27/2023   CHOLHDL 3.6 09/27/2023    Medications Reviewed Today     Reviewed by Billee Mliss BIRCH, [facility] (Pharmacist) on 08/08/24 at 2010  Med List Status: <None>   Medication Order Taking? Sig Documenting Provider Last Dose Status Informant  Accu-Chek Softclix Lancets lancets [id number]  CHECK BLOOD SUGAR TWICE DAILY Dx E11.9 Lavell Bari A, FNP  Active   aspirin 81 MG tablet [id number]  Take 81 mg by mouth daily. [provider]  Active Self  atorvastatin  (LIPITOR) 40 MG tablet [id number]  Take 1 tablet (40 mg total) by mouth daily. Lavell Bari LABOR, FNP  Active     Discontinued 07/24/24 1201 (Change in therapy) Blood Glucose Monitoring Suppl (ACCU-CHEK GUIDE ME) w/Device KIT [id number]  CHECK BLOOD SUGAR TWICE DAILY Dx E11.9 Lavell Lye A, FNP  Active   Cholecalciferol (VITAMIN D3) 10 MCG (400 UNIT) CAPS [id number]   Take by mouth. [provider]  Active   dapagliflozin  propanediol (FARXIGA ) 10 MG TABS tablet [id number]  Take 1 tablet (10 mg total) by mouth daily before breakfast. Lavell Lye A, FNP  Active   docusate sodium (COLACE) 100 MG capsule [id number]  Take 100 mg by mouth daily. [provider]  Active Self  gabapentin  (NEURONTIN ) 400 MG capsule [id number]  Take 1 capsule (400 mg total) by mouth 3 (three) times daily. Lavell Lye A, FNP  Active   glipiZIDE  (GLUCOTROL ) 10 MG tablet [id number]  Take 1 tablet (10 mg total) by mouth 2 (two) times daily before a meal. Lavell Lye A, FNP  Active   glucose blood (ACCU-CHEK GUIDE TEST) test strip [id number]  CHECK BLOOD SUGAR TWICE DAILY Dx E11.9 Lavell Lye A, FNP  Active   omeprazole  (PRILOSEC) 20 MG capsule [id number]  Take 1 capsule (20 mg total) by mouth daily. Lavell Lye LABOR, FNP  Active     Discontinued 07/24/24 1204 valsartan -hydrochlorothiazide  (DIOVAN -HCT) 160-25 MG tablet [id number]  Take 1 tablet by mouth daily. Lavell Lye LABOR, FNP  Active               Assessment/Plan:   Diabetes: - Currently uncontrolled Respectfully declines any further medications for her type 2 diabetes Continue current regimen  Needs re-enrollment in [place]; will reach out to CPhT - Reviewed long term cardiovascular and renal outcomes of uncontrolled blood sugar - Reviewed goal A1c, goal fasting, and goal 2 hour post prandial glucose - Recommend to check glucose daily (fasting) or if symptomatic - new meter sent in for accuchek guide (newly preferred) -compliant with ARB/statin  Follow Up Plan: PCP   Mliss Tarry Griffin, PharmD, BCACP, CPP Clinical Pharmacist, [facility]

## 2024-07-19 NOTE — Progress Notes (Addendum)
   07/19/2024 Name: Renee [name] MRN: [id number] DOB: 13-Dec-1951  Patient is appearing on a report for [employer] [place] Metric Diabetes and last engaged with the clinical pharmacist to discuss diabetes on 07/19/2024. Contacted patient today to discuss diabetes management and completed medication review.   Diabetes Plan from last clinical pharmacist appointment: Received message from PharmD requesting a call be placed to [facility] in regard to which tblood sugar testing supplies is preferred with patient's paper from her insurance stating something about her prescriptions that she gave the pharmacy.    Medication Adherence Barriers Addressed/Actions Taken:  Reviewed medication changes per plan from last clinical pharmacist note Medication Access for Blood glucose testing supplies  Contacted pharmacy regarding new prescriptions Call placed to [facility]. [facility] was able to fill Accu Chek Guide Meter and relevant testing supplies to patient's insurance. The cost to the patient for meter, strips and lancets is zero dollars.These items will be able to be picked up later today Inquired about the paper that the patient mentioned having dropped off at the pharmacy and the [facility] representative was not able to provide any information about this. Attempted to call patient to inform her that the blood glucose testing supplies are ready for her pickup for free. However, received patient's voicemail. HIPAA compliant voicemail left requesting a return call. In basket message response sent to PharmD.  ADDENDUM Patient returned the call and was informed of the above information. Inquired about the paper she gave [facility] and she informs the paper stated that her insurance would no longer cover One Touch blood glucose supplies as of August 1st and gave alternatives. Accu Chek Guide was one of those alternatives. Patient informs she will pick up new devices and will call if unable to set up.  Next clinical  pharmacist appointment is scheduled for: TBD  Kate Caddy, CPhT [facility] Office: [phone] Email: [email or web address]

## 2024-07-19 NOTE — Telephone Encounter (Signed)
 Can you please call [facility] [place] and confirm which meter patient is supposed to be on?  She states insurance changed the preferred meter.  She has both one touch/Accuchek and isn't able to confirm I believe there are reading/educational barriers.  She also states the pharmacy has a paper from her insurance stating something about her prescriptions, but I'm not sure what that would be about.   Any information on this or the meter would be helpful and then I can call in correct meter/supplies  Thanks! Arvilla Salada

## 2024-07-24 ENCOUNTER — Encounter: Payer: Self-pay | Admitting: Neurology

## 2024-07-24 ENCOUNTER — Ambulatory Visit: Admitting: Neurology

## 2024-07-24 VITALS — BP 119/65 | HR 69 | Ht 66.0 in | Wt 161.0 lb

## 2024-07-24 DIAGNOSIS — R55 Syncope and collapse: Secondary | ICD-10-CM | POA: Diagnosis not present

## 2024-07-24 MED ORDER — ATORVASTATIN CALCIUM 40 MG PO TABS: 40.0000 mg | ORAL_TABLET | Freq: Every day | ORAL | 4 refills | Status: AC

## 2024-07-24 MED ORDER — DAPAGLIFLOZIN PROPANEDIOL 10 MG PO TABS: 10.0000 mg | ORAL_TABLET | Freq: Every day | ORAL | 4 refills | Status: AC

## 2024-07-24 NOTE — Addendum Note (Signed)
 Addended by: Jannelly Bergren D on: 07/24/2024 12:08 PM   Modules accepted: Orders

## 2024-07-24 NOTE — Telephone Encounter (Signed)
 Filled accuchek guide meter and test strips,lancets On 07/19/24

## 2024-07-24 NOTE — Addendum Note (Signed)
 Addended by: Dailan Pfalzgraf D on: 07/24/2024 12:06 PM   Modules accepted: Orders

## 2024-07-24 NOTE — Progress Notes (Signed)
 NEUROLOGY FOLLOW UP OFFICE NOTE  BRIYANNA BILLINGHAM [id number] 05/03/1951  HISTORY OF PRESENT ILLNESS: I had the pleasure of seeing Io Dieujuste in follow-up in the neurology clinic on 07/24/2024.  The patient was last seen 4 months ago for syncope that occurred on 02/05/24. She is accompanied by her sister who helps supplement the history today. today.  Records and images were personally reviewed where available.  I personally reviewed brain MRI without contrast done 04/2024 which did not show any changes, largely normal for age with mild chronic microvascular disease. Her wake and sleep EEG in 03/2024 was normal. They deny any further episodes of loss of consciousness since 01/2024. She denies any shaking episodes, no staring/unresponsive episodes, gaps in time, focal numbness/tingling/weakness, myoclonic jerks. She denies any headaches, dizziness, vision changes, no falls. She lives alone.    History on Initial Assessment 03/21/2024: This is a pleasant 73 year old right-handed woman with a history of hypertension, DM2 with neuropathy, hyperlipidemia, seen previously in our office for functional shaking episodes in 08-13-14, presenting for evaluation of loss of consciousness that occurred 02/05/24. She lives alone and was in the bathroom getting ready to dry herself, she recalls squeezing the rag then waking up in the bathtub. She had fallen backwards. She got up then passed out again, waking up on the bathroom floor. She recalls trying to pull herself up to the sink and she was able to get in her bed, then she felt like she needed to go to the bathroom. She then had another syncopal episode in the kitchen. No tongue bite, incontinence, focal weakness. She does not recall being dizzy or diaphoretic. She went to her PCP 3 days after and bloodwork showed an elevated BUN and creatinine (40/1.60) compared to bloodwork from 2 weeks prior (creatinine 1.11). Head CT was ordered which I personally reviewed, no acute changes,  there was diffuse atrophy. She denies any further syncopal episodes since then. She denies feeling dizzy but sometimes feels a little shaky for a few minutes. Sometimes the sensation starts in her legs and radiates to her chest then her head shakes. She feels her heart beating fast and her head nods. She has to get herself controlled for a few minutes. Sometimes she gets a little shaking in her knees or holds her hands together when they are shaky. She is taking Gabapentin  once a day (instead of TID) for neuropathy, there is tingling and burning in her toes and base of her fingers. She has a little neck pain, no back pain. She denies any headaches, diplopia, dysarthria/dysphagia. Memory is not so good, she has to stop and think about it then remembers. She denies missing medications. Mood is pretty good. She was previously living with her siblings until her sister passed away in August 13, 2024. She has been living alone since January. Selena sees her once a month, her other sister lives close and she goes to the hall 3 days a week for activities. She does not drive.   Records from 2014/08/13 were reviewed as follows: This is a pleasant 73 year old right-handed woman presenting for evaluation of shaking episodes.  She was previously seen by one of my partners, Movement Disorders specialist Dr. Evonnie, 2 months ago during which she was noted to have an atypical and nonphysiologic tremor during the visit, suggestive of psychogenic tremors.  She presents today for evaluation of possible seizures, she has undergone 24-hour EEG monitoring in the interim.  I will summarize her history as well.  She was admitted to [facility] in December 2014 for a UTI with weakness.  There was note of shaking and chills secondary to the infection.  Per report, she was having chest pain after being on Metformin for 3 days, that was then discontinued.  On the day of admission, a friend visited her at home and found her so weak, with note of left  arm and leg weakness.  She started shaking with retained awareness.  She was slow to respond to EMS.  They had stated that some said it was tremor and others mentioned seizure, and she was apparently discharged on Keppra  at that time.  She was discharged to rehab where she had more shaking episodes.  On 12/31/2013, she was at home and started having a tremor in both legs.  This progressed to whole body shaking with retained awareness.  She was able to call her sister, who arrived at her house with continued shaking.  She was noted to be slow to respond.  EMS was called due to increasingly violent shaking with eyes rolling back.  She was brought to the ER where Keppra  was increased.  She feels that she cannot control the shaking, she is able to talk sometimes but feels confused.  She started having itching, attributed to Keppra , which was discontinued in January.  She has been off anti-seizure medications since then, with no further similar episodes of whole body shaking.  She however continues to have the leg tremors that can occur when standing or sitting.  If she is standing, she has to sit and wait for a few minutes to quiet down.  Last episode was 4 days ago, she can speak and comprehend during the bilateral leg shaking lasting 10-15 minutes.  They report that if she did not sit down, the shaking in her legs would progress to whole body shaking.  She has been living with her sister since January.     They report a history of seizures in her 2s that were totally different.  She would have brief shaking lasting 5-10 minutes without loss of awareness.  She was not started on seizure medication.  These lasted 3 months then resolved spontaneously.  She had an MRI brain which was normal.  I personally reviewed her 24-hour EEG which was normal.  Typical whole body shaking episodes were not captured, however the patient reports she had bilateral leg shaking during the study.     PAST MEDICAL HISTORY: Past  Medical History:  Diagnosis Date   Arthritis    Breast cancer (HCC) 2001   right mastectomy   CVA (cerebral infarction) 2003, 2005   DM (diabetes mellitus) (HCC)    GERD (gastroesophageal reflux disease)    Heart murmur    Hyperlipidemia    Hypertension    Seizures (HCC)    Vitamin D  deficiency     MEDICATIONS: Current Outpatient Medications on File Prior to Visit  Medication Sig Dispense Refill   Accu-Chek Softclix Lancets lancets CHECK BLOOD SUGAR TWICE DAILY Dx E11.9 200 each 3   aspirin 81 MG tablet Take 81 mg by mouth daily.     atorvastatin  (LIPITOR) 40 MG tablet Take 1 tablet (40 mg total) by mouth daily. 100 tablet 4   Blood Glucose Monitoring Suppl (ACCU-CHEK GUIDE ME) w/Device KIT CHECK BLOOD SUGAR TWICE DAILY Dx E11.9 1 kit 0   Cholecalciferol (VITAMIN D3) 10 MCG (400 UNIT) CAPS Take by mouth.     dapagliflozin  propanediol (FARXIGA ) 10 MG TABS tablet  Take 1 tablet (10 mg total) by mouth daily before breakfast. 90 tablet 4   docusate sodium (COLACE) 100 MG capsule Take 100 mg by mouth daily.     gabapentin  (NEURONTIN ) 400 MG capsule Take 1 capsule (400 mg total) by mouth 3 (three) times daily. 300 capsule 4   glipiZIDE  (GLUCOTROL ) 10 MG tablet Take 1 tablet (10 mg total) by mouth 2 (two) times daily before a meal. 200 tablet 4   glucose blood (ACCU-CHEK GUIDE TEST) test strip CHECK BLOOD SUGAR TWICE DAILY Dx E11.9 200 each 3   omeprazole  (PRILOSEC) 20 MG capsule Take 1 capsule (20 mg total) by mouth daily. 100 capsule 4   valsartan -hydrochlorothiazide  (DIOVAN -HCT) 160-25 MG tablet Take 1 tablet by mouth daily. 100 tablet 3   No current facility-administered medications on file prior to visit.    ALLERGIES: Allergies  Allergen Reactions   Metformin And Related Other (See Comments)    seizures   Penicillins Diarrhea and Nausea And Vomiting   Semaglutide  Other (See Comments)    Reports vision changes    FAMILY HISTORY: Family History  Problem Relation Age of Onset    Alzheimer's disease Mother    Parkinson's disease Mother    Heart Problems Father    Kidney disease Father        +dialysis   Heart disease Father    Other Sister        +TAH for fibroids   Breast cancer Sister        dx. 55-56   Renal cancer Sister        dx. 20-63; not a smoker   Early death Brother    Diabetes Brother    Cancer Paternal Aunt        NOS cancer   Colon cancer Neg Hx    Stomach cancer Neg Hx     SOCIAL HISTORY: Social History   Socioeconomic History   Marital status: Single    Spouse name: Not on file   Number of children: Not on file   Years of education: 12   Highest education level: Associate degree: academic program  Occupational History   Occupation: Retired    Associate Professor: [employer]    Comment: [employer]   Tobacco Use   Smoking status: Never   Smokeless tobacco: Never  Vaping Use   Vaping status: Never Used  Substance and Sexual Activity   Alcohol use: Not Currently    Alcohol/week: 1.0 standard drink of alcohol    Types: 1 Glasses of wine per week   Drug use: No   Sexual activity: Not Currently    Birth control/protection: Post-menopausal  Other Topics Concern   Not on file  Social History Narrative   Retired from school system, single, no children. Enjoys reading and word puzzles.    Jehovah's Witness - goes to [employer] get together [employer], Saturdays   Her parents left their home to her and her sister      Right hand   Living alone, 1 step by the hallway      Social Drivers of Health   Financial Resource Strain: Low Risk  (07/25/2023)   Overall Financial Resource Strain (CARDIA)    Difficulty of Paying Living Expenses: Not hard at all  Food Insecurity: No Food Insecurity (05/03/2024)   Hunger Vital Sign    Worried About Running Out of Food in the Last Year: Never true    Ran Out of Food  in the Last Year: Never true  Transportation Needs: No Transportation Needs (05/03/2024)    PRAPARE - Administrator, Civil Service (Medical): No    Lack of Transportation (Non-Medical): No  Physical Activity: Insufficiently Active (07/25/2023)   Exercise Vital Sign    Days of Exercise per Week: 3 days    Minutes of Exercise per Session: 30 min  Stress: No Stress Concern Present (07/25/2023)   [employer] of Occupational Health - Occupational Stress Questionnaire    Feeling of Stress : Not at all  Social Connections: Moderately Isolated (07/25/2023)   Social Connection and Isolation Panel    Frequency of Communication with Friends and Family: More than three times a week    Frequency of Social Gatherings with Friends and Family: More than three times a week    Attends Religious Services: More than 4 times per year    Active Member of [employer] or Organizations: No    Attends Banker Meetings: Never    Marital Status: Never married  Intimate Partner Violence: Not At Risk (05/03/2024)   Humiliation, Afraid, Rape, and Kick questionnaire    Fear of Current or Ex-Partner: No    Emotionally Abused: No    Physically Abused: No    Sexually Abused: No     PHYSICAL EXAM: Vitals:   07/24/24 1544  BP: 119/65  Pulse: 69  SpO2: 97%   General: No acute distress Head:  Normocephalic/atraumatic Skin/Extremities: No rash, no edema Neurological Exam: alert and awake. No aphasia or dysarthria. Fund of knowledge is appropriate.  Attention and concentration are normal.   Cranial nerves: Pupils equal, round. Extraocular movements intact with no nystagmus. Visual fields full.  No facial asymmetry.  Motor: Bulk and tone normal, muscle strength 5/5 throughout with no pronator drift.   Finger to nose testing intact.  Gait narrow-based and steady, no ataxia. No tremors.    IMPRESSION: This is a pleasant 72 yo RH woman with a history of hypertension, DM2 with neuropathy, hyperlipidemia, seen previously in our office for non-epileptic shaking episodes in 2015, who  presented for an episode of loss of consciousness that occurred in 01/2024, suggestive of vasovagal syncope/dehydration. MRI brain and EEG normal. She denies any further episodes since 01/2024. If symptoms recur, recommend Cardiology evaluation. Follow-up as needed, call for any changes.   Thank you for allowing me to participate in her care.  Please do not hesitate to call for any questions or concerns.    Darice Shivers, M.D.   CC: Bari Learn, FNP

## 2024-07-24 NOTE — Telephone Encounter (Signed)
 Refills sent

## 2024-07-24 NOTE — Patient Instructions (Signed)
 Good to see you doing well! Your brain MRI and EEG were normal. Continue staying hydrated. Follow-up as needed, call for any changes.

## 2024-08-03 ENCOUNTER — Other Ambulatory Visit: Payer: Self-pay | Admitting: *Deleted

## 2024-08-03 ENCOUNTER — Other Ambulatory Visit: Payer: Self-pay

## 2024-08-03 NOTE — Patient Instructions (Signed)
 Visit Information  Thank you for taking time to visit with me today. Please don't hesitate to contact me if I can be of assistance to you before our next scheduled appointment.  Your next care management appointment is by telephone on 08-31-24 at 9:30 am  Telephone follow-up in 1 month  Please call the care guide team at [phone] if you need to cancel, schedule, or reschedule an appointment.   Please call the Suicide and Crisis Lifeline: 988 call the [place]  National Suicide Prevention Lifeline: 6413345812 or TTY: (775)791-5408 TTY (443) 796-9916) to talk to a trained counselor call the [facility]: [phone] if you are experiencing a Mental Health or Behavioral Health Crisis or need someone to talk to.  Mana Haberl, RN, BSN, Theatre manager [employer] [phone]

## 2024-08-03 NOTE — Patient Outreach (Signed)
 Complex Care Management   Visit Note  08/03/2024  Name:  Renee [name] MRN: [id number] DOB: 04-Dec-1951  Situation: Referral received for Complex Care Management related to Diabetes with Complications and HTN I obtained verbal consent from Patient.  Visit completed with Patient  on the phone  Background:   Past Medical History:  Diagnosis Date   Arthritis    Breast cancer (HCC) 2001   right mastectomy   CVA (cerebral infarction) 2003, 2005   DM (diabetes mellitus) (HCC)    GERD (gastroesophageal reflux disease)    Heart murmur    Hyperlipidemia    Hypertension    Seizures (HCC)    Vitamin D  deficiency     Assessment: Patient Reported Symptoms:  Cognitive Cognitive Status: Alert and oriented to person, place, and time, Insightful and able to interpret abstract concepts, Normal speech and language skills Cognitive/Intellectual Conditions Management [RPT]: None reported or documented in medical history or problem list   Health Maintenance Behaviors: Annual physical exam, Stress management, Spiritual practice(s), Healthy diet Healing Pattern: Average Health Facilitated by: Prayer/meditation, Rest, Stress management, Healthy diet  Neurological Neurological Review of Symptoms: No symptoms reported Neurological Management Strategies: Routine screening Neurological Self-Management Outcome: 4 (good)  HEENT HEENT Symptoms Reported: No symptoms reported HEENT Management Strategies: Routine screening HEENT Self-Management Outcome: 4 (good)    Cardiovascular Cardiovascular Symptoms Reported: No symptoms reported Does patient have uncontrolled Hypertension?: Yes Is patient checking Blood Pressure at home?: Yes Patient's Recent BP reading at home: 120/57- today Pulse 70 Cardiovascular Management Strategies: Medication therapy, Routine screening, Diet modification  Respiratory Respiratory Symptoms Reported: No symptoms reported Respiratory Management Strategies: Routine  screening Respiratory Self-Management Outcome: 4 (good)  Endocrine Endocrine Symptoms Reported: No symptoms reported Is patient diabetic?: Yes Is patient checking blood sugars at home?: Yes List most recent blood sugar readings, include date and time of day: 206-Fasting today Endocrine Self-Management Outcome: 3 (uncertain) Endocrine Comment: Highest- 285 ( 08-02-24) Lowest- 114 (07-11-24)  Gastrointestinal Gastrointestinal Symptoms Reported: No symptoms reported Gastrointestinal Management Strategies: Medication therapy Gastrointestinal Self-Management Outcome: 4 (good)    Genitourinary Genitourinary Symptoms Reported: No symptoms reported Genitourinary Management Strategies: Medication therapy Genitourinary Self-Management Outcome: 4 (good)  Integumentary Integumentary Symptoms Reported: No symptoms reported Skin Management Strategies: Medication therapy, Routine screening Skin Self-Management Outcome: 4 (good)  Musculoskeletal Musculoskelatal Symptoms Reviewed: No symptoms reported Musculoskeletal Management Strategies: Medication therapy, Routine screening Musculoskeletal Self-Management Outcome: 4 (good) Falls in the past year?: Yes Number of falls in past year: 1 or less Was there an injury with Fall?: No Fall Risk Category Calculator: 1 Patient Fall Risk Level: Low Fall Risk Patient at Risk for Falls Due to: History of fall(s)  Psychosocial Psychosocial Symptoms Reported: No symptoms reported Behavioral Management Strategies: Support system Behavioral Health Self-Management Outcome: 4 (good)   Quality of Family Relationships: helpful, supportive, involved Do you feel physically threatened by others?: No      08/03/2024   11:34 AM  Depression screen PHQ 2/9  Decreased Interest 0  Down, Depressed, Hopeless 0  PHQ - 2 Score 0    Vitals:   08/03/24 1127  BP: (!) 120/57  Pulse: 70    Medications Reviewed Today     Reviewed by Jorja Nichole LABOR, RN (Case Manager) on  08/03/24 at 1117  Med List Status: <None>   Medication Order Taking? Sig Documenting Provider Last Dose Status Informant  Accu-Chek Softclix Lancets lancets [id number] Yes CHECK BLOOD SUGAR TWICE DAILY Dx E11.9 Lavell Bari LABOR, FNP  Active  aspirin 81 MG tablet [id number] Yes Take 81 mg by mouth daily. [provider]  Active Self  atorvastatin  (LIPITOR) 40 MG tablet [id number] Yes Take 1 tablet (40 mg total) by mouth daily. Lavell Bari LABOR, FNP  Active   Blood Glucose Monitoring Suppl (ACCU-CHEK GUIDE ME) w/Device KIT [id number] Yes CHECK BLOOD SUGAR TWICE DAILY Dx E11.9 Lavell Bari A, FNP  Active   Cholecalciferol (VITAMIN D3) 10 MCG (400 UNIT) CAPS [id number] Yes Take by mouth. [provider]  Active   dapagliflozin  propanediol (FARXIGA ) 10 MG TABS tablet [id number] Yes Take 1 tablet (10 mg total) by mouth daily before breakfast. Lavell Bari A, FNP  Active   docusate sodium (COLACE) 100 MG capsule [id number] Yes Take 100 mg by mouth daily. [provider]  Active Self  gabapentin  (NEURONTIN ) 400 MG capsule [id number] Yes Take 1 capsule (400 mg total) by mouth 3 (three) times daily. Lavell Bari A, FNP  Active   glipiZIDE  (GLUCOTROL ) 10 MG tablet [id number] Yes Take 1 tablet (10 mg total) by mouth 2 (two) times daily before a meal. Lavell Bari A, FNP  Active   glucose blood (ACCU-CHEK GUIDE TEST) test strip [id number] Yes CHECK BLOOD SUGAR TWICE DAILY Dx E11.9 Lavell Bari A, FNP  Active   omeprazole  (PRILOSEC) 20 MG capsule [id number] Yes Take 1 capsule (20 mg total) by mouth daily. Lavell Bari A, FNP  Active   valsartan -hydrochlorothiazide  (DIOVAN -HCT) 160-25 MG tablet [id number] Yes Take 1 tablet by mouth daily. Lavell Bari LABOR, FNP  Active             Recommendation:   Continue Current Plan of Care  Follow Up Plan:   Telephone follow-up in 1 month  Shields Pautz, RN, BSN, Theatre manager [employer] [phone]

## 2024-08-16 ENCOUNTER — Other Ambulatory Visit: Payer: Self-pay

## 2024-08-16 ENCOUNTER — Other Ambulatory Visit (HOSPITAL_COMMUNITY): Payer: Self-pay

## 2024-08-16 ENCOUNTER — Other Ambulatory Visit (INDEPENDENT_AMBULATORY_CARE_PROVIDER_SITE_OTHER)

## 2024-08-16 DIAGNOSIS — Z7984 Long term (current) use of oral hypoglycemic drugs: Secondary | ICD-10-CM

## 2024-08-16 DIAGNOSIS — E119 Type 2 diabetes mellitus without complications: Secondary | ICD-10-CM

## 2024-08-16 MED ORDER — ACCU-CHEK SOFTCLIX LANCETS MISC
3 refills | Status: AC
  Filled 2024-08-16: qty 200, 100d supply, fill #0

## 2024-08-16 MED ORDER — ACCU-CHEK GUIDE ME W/DEVICE KIT
PACK | 0 refills | Status: AC
  Filled 2024-08-16: qty 1, 30d supply, fill #0

## 2024-08-16 MED ORDER — ACCU-CHEK GUIDE TEST VI STRP
ORAL_STRIP | 3 refills | Status: AC
  Filled 2024-08-16: qty 200, 100d supply, fill #0

## 2024-08-16 NOTE — Progress Notes (Signed)
 08/16/2024 Name: Renee [name] MRN: [id number] DOB: Apr 30, 1951  Chief Complaint  Patient presents with   Diabetes    Renee [name] is a 73 y.o. year old female who presented for a telephone visit. I connected with  Renee [name] on 08/16/24 by telephone and verified that I am speaking with the correct person using two identifiers. I discussed the limitations of evaluation and management by telemedicine. The patient expressed understanding and agreed to proceed.  Patient was located in her home and PharmD in PCP office during this visit.    They were referred to the pharmacist by their PCP for assistance in managing diabetes, medication access, and complex medication management.    Subjective:  Patient reports she is doing okay, but her blood sugars remain elevated up to 250.  She has respectfully declined any future medications for her type 2 diabetes.  She is now receiving Farxiga  in the mail via [place] patient assistance.  Care Team: Primary Care Provider: Lavell Bari LABOR, Renee [name]    Medication Access/Adherence  Current Pharmacy:  [facility] - [place], [place] - [address] [facility] [address] [place] [address] [place] [address] Phone: [phone] Fax: [phone]  [facility] [address], [place] - [address] [place] [address] Phone: [phone] Fax: [phone]  [facility] - [place], [place] - [address] [place]  [address] Phone: [phone] Fax: [phone]  Renee [name] - [facility] [address] [place] [address] Phone: [phone] Fax: [phone]   Patient reports affordability concerns with their medications: Yes  Patient reports access/transportation concerns to their pharmacy: No  Patient reports adherence concerns with their medications:  No    Diabetes:  Current medications: glipizide ,  Farxiga  Medications tried in the past: metformin, Rybelsus , Januvia  (all stopped due to GI and cost) She was prescribed Trulicity  03/216  declined to start due to concerns with possible side effects. Respectfully declines additional T2DM medications  Current glucose readings: FBG<180, PPBG up to 250; improved control needed Using accuchek GUIDE meter; testing occasionally   BP <120/57 HR 70; checking at home Reports drinking plenty of water  Patient denies hypoglycemic s/sx including dizziness, shakiness, sweating. Patient denies hyperglycemic symptoms including polyuria, polydipsia, polyphagia, nocturia, neuropathy, blurred vision.  Current meal patterns:  Discussed meal planning options and Plate method for healthy eating Avoid sugary drinks and desserts Incorporate balanced protein, non starchy veggies, 1 serving of carbohydrate with each meal Increase water intake Increase physical activity as able  Current physical activity: encouraged as able   Current medication access support: medicare   Objective:  Lab Results  Component Value Date   HGBA1C 10.7 (H) 05/24/2024    Lab Results  Component Value Date   CREATININE 1.39 (H) 05/24/2024   BUN 38 (H) 05/24/2024   NA 140 05/24/2024   K 4.4 05/24/2024   CL 102 05/24/2024   CO2 19 (L) 05/24/2024    Lab Results  Component Value Date   CHOL 178 09/27/2023   HDL 49 09/27/2023   LDLCALC 103 (H) 09/27/2023   TRIG 145 09/27/2023   CHOLHDL 3.6 09/27/2023    Medications Reviewed Today     Reviewed by Renee [name], [facility] (Pharmacist) on 08/16/24 at [phone]  Med List Status: <None>   Medication Order Taking? Sig Documenting Provider Last Dose Status Informant  Accu-Chek Softclix Lancets lancets [id number]  CHECK BLOOD SUGAR TWICE DAILY Dx E11.9 Renee Bari LABOR, Renee [name]  Active   aspirin 81 MG tablet [id number]  Take 81 mg by mouth daily. [provider]  Active Self  atorvastatin  (LIPITOR) 40 MG tablet [id number]  Take 1  tablet (40 mg total) by mouth daily. Renee Bari LABOR, Renee [name]  Active   Blood Glucose Monitoring Suppl (ACCU-CHEK GUIDE ME) w/Device PRESSLEY [id number]  CHECK BLOOD SUGAR TWICE DAILY Dx E11.9 Renee Bari A, Renee [name]  Active   Cholecalciferol (VITAMIN D3) 10 MCG (400 UNIT) CAPS [id number]  Take by mouth. [provider]  Active   dapagliflozin  propanediol (FARXIGA ) 10 MG TABS tablet [id number]  Take 1 tablet (10 mg total) by mouth daily before breakfast. Renee Bari A, Renee [name]  Active   docusate sodium (COLACE) 100 MG capsule [id number]  Take 100 mg by mouth daily. [provider]  Active Self  gabapentin  (NEURONTIN ) 400 MG capsule [id number]  Take 1 capsule (400 mg total) by mouth 3 (three) times daily. Renee Bari A, Renee [name]  Active   glipiZIDE  (GLUCOTROL ) 10 MG tablet [id number]  Take 1 tablet (10 mg total) by mouth 2 (two) times daily before a meal. Renee Bari A, Renee [name]  Active   glucose blood (ACCU-CHEK GUIDE TEST) test strip [id number]  CHECK BLOOD SUGAR TWICE DAILY Dx E11.9 Renee Bari A, Renee [name]  Active   omeprazole  (PRILOSEC) 20 MG capsule [id number]  Take 1 capsule (20 mg total) by mouth daily. Renee Bari A, Renee [name]  Active   valsartan -hydrochlorothiazide  (DIOVAN -HCT) 160-25 MG tablet [id number]  Take 1 tablet by mouth daily. Renee Bari LABOR, Renee [name]  Active               Assessment/Plan:   Diabetes: - Currently uncontrolled Respectfully declines any further medications for her type 2 diabetes Continue current regimen  re-enrollme in [place]; Farxiga  now being delivered Needs Accuchek GUIDE--sent to [place]; CPhT to follow - Reviewed [name] term cardiovascular and renal outcomes of uncontrolled blood sugar - Reviewed goal A1c, goal fasting, and goal 2 hour post prandial glucose - Recommend to check glucose daily (fasting) or if symptomatic - new meter sent in for accuchek guide (newly preferred) -compliant with ARB/statin  Follow Up Plan: PCP/CPhT   Mliss Tarry Griffin,  PharmD, BCACP, CPP Clinical Pharmacist, [facility]

## 2024-08-16 NOTE — Progress Notes (Unsigned)
 Cardiology Office Note   Date:  08/17/2024   ID:  Renee [name], Renee [name] 11-29-1951, MRN [id number]  PCP:  Lavell Bari LABOR, FNP  Cardiologist:   None Referring:  Lavell Bari LABOR, FNP  Chief Complaint  Patient presents with   Loss of Consciousness      History of Present Illness: Renee [name] is a 73 y.o. female who presents for evaluation of syncope.   She reports an episode of syncope.  She was standing in her bathroom.  She was just getting ready to bring out a rag.  She been standing for a while when she felt like something was going to happen and the next thing she knows she found herself in the bathroom.  She did not have trauma.  She did not feel palpitations.  She has not been having any orthostatic symptoms.  She lives by herself.  She does her household chores.  She is active with her congregation.  She is a Scientist, product/process development and goes door-to-door.  She goes shopping and out to eat.  She does not get chest pressure, neck or arm discomfort.  She does not have any shortness of breath, PND or orthopnea.  She does not describe leg swelling.  She did see neurology and had an MRI that was unremarkable and an EEG that demonstrated no significant abnormalities.  It was suggested that this may have been vagal.  She saw Dr. Debera years ago.  I see mention of an echo stress test in 2014 that apparently was unremarkable.  She is otherwise not had cardiac history.    Past Medical History:  Diagnosis Date   Arthritis    Breast cancer (HCC) 2001   right mastectomy   CVA (cerebral infarction) 2003, 2005   DM (diabetes mellitus) (HCC)    GERD (gastroesophageal reflux disease)    Heart murmur    Hyperlipidemia    Hypertension    Seizures (HCC)    Vitamin D  deficiency     Past Surgical History:  Procedure Laterality Date   ABDOMINAL HYSTERECTOMY  2000   GANGLION CYST EXCISION  2010   right (2010)  and left (before 2010) hand   MASTECTOMY  2001   right    TONSILECTOMY/ADENOIDECTOMY WITH MYRINGOTOMY Bilateral    age 87's     Current Outpatient Medications  Medication Sig Dispense Refill   Accu-Chek Softclix Lancets lancets CHECK BLOOD SUGAR TWICE DAILY Dx E11.9 200 each 3   aspirin 81 MG tablet Take 81 mg by mouth daily.     atorvastatin  (LIPITOR) 40 MG tablet Take 1 tablet (40 mg total) by mouth daily. 100 tablet 4   Blood Glucose Monitoring Suppl (ACCU-CHEK GUIDE ME) w/Device KIT CHECK BLOOD SUGAR TWICE DAILY Dx E11.9 1 kit 0   Cholecalciferol (VITAMIN D3) 10 MCG (400 UNIT) CAPS Take by mouth.     dapagliflozin  propanediol (FARXIGA ) 10 MG TABS tablet Take 1 tablet (10 mg total) by mouth daily before breakfast. 90 tablet 4   docusate sodium (COLACE) 100 MG capsule Take 100 mg by mouth daily.     gabapentin  (NEURONTIN ) 400 MG capsule Take 1 capsule (400 mg total) by mouth 3 (three) times daily. 300 capsule 4   glipiZIDE  (GLUCOTROL ) 10 MG tablet Take 1 tablet (10 mg total) by mouth 2 (two) times daily before a meal. 200 tablet 4   glucose blood (ACCU-CHEK GUIDE TEST) test strip CHECK BLOOD SUGAR TWICE DAILY Dx E11.9 200 each 3   omeprazole  (  PRILOSEC) 20 MG capsule Take 1 capsule (20 mg total) by mouth daily. 100 capsule 4   valsartan -hydrochlorothiazide  (DIOVAN -HCT) 160-25 MG tablet Take 1 tablet by mouth daily. 100 tablet 3   No current facility-administered medications for this visit.    Allergies:   Metformin and related, Penicillins, and Semaglutide     Social History:  The patient  reports that she has never smoked. She has never used smokeless tobacco. She reports that she does not currently use alcohol after a past usage of about 1.0 standard drink of alcohol per week. She reports that she does not use drugs.   Family History:  The patient's family history includes Alzheimer's disease in her mother; Breast cancer in her sister; Cancer in her paternal aunt; Diabetes in her brother; Early death in her brother; Heart Problems in her  father; Heart disease in her father; Kidney disease in her father; Other in her sister; Parkinson's disease in her mother; Renal cancer in her sister.    ROS:  Please see the history of present illness.   Otherwise, review of systems are positive for none.   All other systems are reviewed and negative.    PHYSICAL EXAM: VS:  BP 121/66   Pulse 66   Ht 5' 6 (1.676 m)   Wt 161 lb (73 kg)   SpO2 96%   BMI 25.99 kg/m  , BMI Body mass index is 25.99 kg/m. GENERAL:  Well appearing HEENT:  Pupils equal round and reactive, fundi not visualized, oral mucosa unremarkable NECK:  No jugular venous distention, waveform within normal limits, carotid upstroke brisk and symmetric, no bruits, no thyromegaly LYMPHATICS:  No cervical, inguinal adenopathy LUNGS:  Clear to auscultation bilaterally BACK:  No CVA tenderness CHEST:  Unremarkable HEART:  PMI not displaced or sustained,S1 and S2 within normal limits, no S3, no S4, no clicks, no rubs,  soft brief apical systolic murmur nonradiating, no diastolic murmurs ABD:  Flat, positive bowel sounds normal in frequency in pitch, no bruits, no rebound, no guarding, no midline pulsatile mass, no hepatomegaly, no splenomegaly EXT:  2 plus pulses throughout, no edema, no cyanosis no clubbing SKIN:  No rashes no nodules NEURO:  Cranial nerves II through XII grossly intact, motor grossly intact throughout [facility]:  Cognitively intact, oriented to person place and time    EKG:  EKG Interpretation Date/Time:  Friday August 17 2024 11:05:01 EDT Ventricular Rate:  66 PR Interval:  194 QRS Duration:  78 QT Interval:  410 QTC Calculation: 429 R Axis:   34  Text Interpretation: Normal sinus rhythm Poor anterior R wave progression When compared with ECG of 14-Jan-2016 22:54, No significant change was found Confirmed by Lavona Agent ([id number]) on 08/17/2024 11:56:35 AM     Recent Labs: 02/08/2024: Hemoglobin 11.1; Magnesium 2.0; Platelets 247; TSH  1.070 05/24/2024: ALT 20; BUN 38; Creatinine, Ser 1.39; Potassium 4.4; Sodium 140    Lipid Panel    Component Value Date/Time   CHOL 178 09/27/2023 1051   TRIG 145 09/27/2023 1051   HDL 49 09/27/2023 1051   CHOLHDL 3.6 09/27/2023 1051   LDLCALC 103 (H) 09/27/2023 1051      Wt Readings from Last 3 Encounters:  08/17/24 161 lb (73 kg)  07/24/24 161 lb (73 kg)  05/24/24 160 lb (72.6 kg)      Other studies Reviewed: Additional studies/ records that were reviewed today include: Neurology records. Review of the above records demonstrates:  Please see elsewhere in the note.  ASSESSMENT AND PLAN:   Syncope: She was not orthostatic in the office.  I will check an echocardiogram and a 2-week monitor.  If however there are no clear etiologies and no further workup will be suggested unless she has recurrent syncope.  Murmur, I suspect some mild aortic sclerosis but will follow-up with an echo.  Hypertension: Her blood pressure is well-controlled.  No change in therapy.  Current medicines are reviewed at length with the patient today.  The patient does not have concerns regarding medicines.  The following changes have been made:  no change  Labs/ tests ordered today include:   Orders Placed This Encounter  Procedures   LONG TERM MONITOR (3-14 DAYS)   EKG 12-Lead   ECHOCARDIOGRAM COMPLETE     Disposition:   FU with me based on the results of the above or further events.   Signed, Lynwood Schilling, MD  08/17/2024 12:51 PM    [facility]

## 2024-08-17 ENCOUNTER — Encounter: Payer: Self-pay | Admitting: Cardiology

## 2024-08-17 ENCOUNTER — Ambulatory Visit: Attending: Cardiology | Admitting: Cardiology

## 2024-08-17 ENCOUNTER — Ambulatory Visit

## 2024-08-17 ENCOUNTER — Telehealth: Payer: Self-pay | Admitting: Pharmacy Technician

## 2024-08-17 VITALS — BP 121/66 | HR 66 | Ht 66.0 in | Wt 161.0 lb

## 2024-08-17 DIAGNOSIS — R55 Syncope and collapse: Secondary | ICD-10-CM | POA: Diagnosis not present

## 2024-08-17 DIAGNOSIS — R011 Cardiac murmur, unspecified: Secondary | ICD-10-CM | POA: Diagnosis not present

## 2024-08-17 NOTE — Progress Notes (Addendum)
   08/17/2024  Patient ID: Renee [name], female   DOB: [birth year], 73 y.o.   MRN: [id number]  Patient engaged with clinical pharmacist for management of diabetes on 08/16/2024. Outreach by [employer] technician was requested.   Outreached patient to discuss diabetes medication management. Left voicemail for patient to return my call at their convenience.   ADDENDUM 08/17/2024 Attempted another outreach this afternoon. Unfortunately, patient did not answer the phone. HIPAA compliant voicemail left.    Nissa Stannard, CPhT [facility] Office: [phone] Email: [email or web address]

## 2024-08-17 NOTE — Progress Notes (Unsigned)
 Enrolled patient for a 14 day Zio XT  monitor to be mailed to patients home

## 2024-08-17 NOTE — Patient Instructions (Signed)
 Medication Instructions:  Continue same medications  Lab Work: None ordered  Testing/Procedures: Echo  First available  2 week Event Monitor  will be mailed to home with instructions  Follow-Up: At [facility], you and your health needs are our priority.  As part of our continuing mission to provide you with exceptional heart care, our providers are all part of one team.  This team includes your primary Cardiologist (physician) and Advanced Practice Providers or APPs (Physician Assistants and Nurse Practitioners) who all work together to provide you with the care you need, when you need it.  Your next appointment:  To Be Determined    Provider:  Dr.Hochrein    We recommend signing up for the patient portal called MyChart.  Sign up information is provided on this After Visit Summary.  MyChart is used to connect with patients for Virtual Visits (Telemedicine).  Patients are able to view lab/test results, encounter notes, upcoming appointments, etc.  Non-urgent messages can be sent to your provider as well.   To learn more about what you can do with MyChart, go to [email or web address].

## 2024-08-31 ENCOUNTER — Telehealth: Admitting: *Deleted

## 2024-09-07 ENCOUNTER — Other Ambulatory Visit: Payer: Self-pay | Admitting: *Deleted

## 2024-09-07 NOTE — Patient Instructions (Signed)
 Visit Information  Thank you for taking time to visit with me today. Please don't hesitate to contact me if I can be of assistance to you before our next scheduled appointment.  Your next care management appointment is by telephone on 10-10-2024 at 9:30 am  Telephone follow-up in 1 month  Please call the care guide team at [phone] if you need to cancel, schedule, or reschedule an appointment.   Please call the Suicide and Crisis Lifeline: 988 call the [place]  National Suicide Prevention Lifeline: 507 016 4903 or TTY: 903-443-6341 TTY (318)067-9380) to talk to a trained counselor call 1-800-273-TALK (toll free, 24 hour hotline) if you are experiencing a Mental Health or Behavioral Health Crisis or need someone to talk to.  Rosina Forte, BSN RN [facility], [facility] RN Care Manager Direct Dial : [phone]  Fax: [phone]

## 2024-09-07 NOTE — Patient Outreach (Signed)
 Complex Care Management   Visit Note  09/07/2024  Name:  Renee [name] MRN: [id number] DOB: March 08, 1951  Situation: Referral received for Complex Care Management related to Diabetes with Complications and HTN I obtained verbal consent from Patient.  Visit completed with Patient  on the phone  Background:   Past Medical History:  Diagnosis Date   Arthritis    Breast cancer (HCC) 2001   right mastectomy   CVA (cerebral infarction) 2003, 2005   DM (diabetes mellitus) (HCC)    GERD (gastroesophageal reflux disease)    Heart murmur    Hyperlipidemia    Hypertension    Seizures (HCC)    Vitamin D  deficiency     Assessment: Patient Reported Symptoms:  Cognitive Cognitive Status: No symptoms reported Cognitive/Intellectual Conditions Management [RPT]: None reported or documented in medical history or problem list   Health Maintenance Behaviors: Annual physical exam Healing Pattern: Average Health Facilitated by: Rest  Neurological Neurological Review of Symptoms: No symptoms reported    HEENT HEENT Symptoms Reported: Tearing      Cardiovascular Cardiovascular Symptoms Reported: No symptoms reported Does patient have uncontrolled Hypertension?: No Is patient checking Blood Pressure at home?: Yes Patient's Recent BP reading at home: 107/63    Respiratory Respiratory Symptoms Reported: No symptoms reported    Endocrine Endocrine Symptoms Reported: No symptoms reported Is patient diabetic?: Yes Is patient checking blood sugars at home?: Yes List most recent blood sugar readings, include date and time of day: 09/07/24 at 0800 264 Endocrine Self-Management Outcome: 4 (good)  Gastrointestinal Gastrointestinal Symptoms Reported: No symptoms reported   Nutrition Risk Screen (CP): No indicators present  Genitourinary Genitourinary Symptoms Reported: No symptoms reported    Integumentary Integumentary Symptoms Reported: No symptoms reported    Musculoskeletal Musculoskelatal  Symptoms Reviewed: No symptoms reported   Falls in the past year?: Yes Number of falls in past year: 1 or less Was there an injury with Fall?: No Fall Risk Category Calculator: 1 Patient Fall Risk Level: Low Fall Risk Patient at Risk for Falls Due to: History of fall(s) Fall risk Follow up: Falls evaluation completed  Psychosocial Psychosocial Symptoms Reported: No symptoms reported Behavioral Management Strategies: Support system Major Change/Loss/Stressor/Fears (CP): Denies Quality of Family Relationships: helpful, involved, supportive Do you feel physically threatened by others?: No    09/07/2024    PHQ2-9 Depression Screening   Little interest or pleasure in doing things Not at all  Feeling down, depressed, or hopeless Not at all  PHQ-2 - Total Score 0  Trouble falling or staying asleep, or sleeping too much    Feeling tired or having little energy    Poor appetite or overeating     Feeling bad about yourself - or that you are a failure or have let yourself or your family down    Trouble concentrating on things, such as reading the newspaper or watching television    Moving or speaking so slowly that other people could have noticed.  Or the opposite - being so fidgety or restless that you have been moving around a lot more than usual    Thoughts that you would be better off dead, or hurting yourself in some way    PHQ2-9 Total Score    If you checked off any problems, how difficult have these problems made it for you to do your work, take care of things at home, or get along with other people    Depression Interventions/Treatment      Vitals:  09/07/24 0937  BP: 107/63  Pulse: 80    Medications Reviewed Today     Reviewed by Bertrum Rosina HERO, RN (Registered Nurse) on 09/07/24 at [phone]  Med List Status: <None>   Medication Order Taking? Sig Documenting Provider Last Dose Status Informant  Accu-Chek Softclix Lancets lancets [id number] Yes CHECK BLOOD SUGAR TWICE DAILY Dx  E11.9 Lavell Lye A, FNP  Active   aspirin 81 MG tablet [id number] Yes Take 81 mg by mouth daily. [provider]  Active Self  atorvastatin  (LIPITOR) 40 MG tablet [id number] Yes Take 1 tablet (40 mg total) by mouth daily. Lavell Lye LABOR, FNP  Active   Blood Glucose Monitoring Suppl (ACCU-CHEK GUIDE ME) w/Device KIT [id number] Yes CHECK BLOOD SUGAR TWICE DAILY Dx E11.9 Lavell Lye A, FNP  Active   Cholecalciferol (VITAMIN D3) 10 MCG (400 UNIT) CAPS [id number] Yes Take by mouth. [provider]  Active   dapagliflozin  propanediol (FARXIGA ) 10 MG TABS tablet [id number] Yes Take 1 tablet (10 mg total) by mouth daily before breakfast. Lavell Lye A, FNP  Active   docusate sodium (COLACE) 100 MG capsule [id number] Yes Take 100 mg by mouth daily. [provider]  Active Self  gabapentin  (NEURONTIN ) 400 MG capsule [id number] Yes Take 1 capsule (400 mg total) by mouth 3 (three) times daily. Lavell Lye A, FNP  Active   glipiZIDE  (GLUCOTROL ) 10 MG tablet [id number] Yes Take 1 tablet (10 mg total) by mouth 2 (two) times daily before a meal. Lavell Lye A, FNP  Active   glucose blood (ACCU-CHEK GUIDE TEST) test strip [id number] Yes CHECK BLOOD SUGAR TWICE DAILY Dx E11.9 Lavell Lye A, FNP  Active   omeprazole  (PRILOSEC) 20 MG capsule [id number] Yes Take 1 capsule (20 mg total) by mouth daily. Lavell Lye A, FNP  Active   valsartan -hydrochlorothiazide  (DIOVAN -HCT) 160-25 MG tablet [id number] Yes Take 1 tablet by mouth daily. Lavell Lye LABOR, FNP  Active             Recommendation:   Continue Current Plan of Care  Follow Up Plan:   Telephone follow-up in 1 month  Rosina Bertrum, BSN RN [facility], [facility] RN Care Manager Direct Dial : [phone]  Fax: [phone]

## 2024-09-12 ENCOUNTER — Ambulatory Visit (HOSPITAL_COMMUNITY)
Admission: RE | Admit: 2024-09-12 | Discharge: 2024-09-12 | Disposition: A | Discharge status: Home / Self Care | Source: Ambulatory Visit | Attending: Vascular Surgery | Admitting: Vascular Surgery

## 2024-09-12 DIAGNOSIS — R55 Syncope and collapse: Secondary | ICD-10-CM | POA: Diagnosis not present

## 2024-09-12 DIAGNOSIS — R011 Cardiac murmur, unspecified: Secondary | ICD-10-CM | POA: Insufficient documentation

## 2024-09-14 DIAGNOSIS — R55 Syncope and collapse: Secondary | ICD-10-CM | POA: Diagnosis not present

## 2024-09-14 LAB — ECHOCARDIOGRAM COMPLETE
AR max vel: 1.75 cm2
AV Area VTI: 1.7 cm2
AV Area mean vel: 1.68 cm2
AV Mean grad: 3 mmHg
AV Peak grad: 6.7 mmHg
Ao pk vel: 1.29 m/s
Area-P 1/2: 4.06 cm2
S' Lateral: 2.95 cm

## 2024-09-16 ENCOUNTER — Ambulatory Visit: Payer: Self-pay | Admitting: Cardiology

## 2024-09-24 ENCOUNTER — Encounter: Payer: Self-pay | Admitting: Family

## 2024-09-24 ENCOUNTER — Ambulatory Visit: Admitting: Family

## 2024-09-24 VITALS — BP 110/68 | HR 69 | Temp 97.6°F | Ht 66.0 in | Wt 159.6 lb

## 2024-09-24 DIAGNOSIS — I10 Essential (primary) hypertension: Secondary | ICD-10-CM

## 2024-09-24 DIAGNOSIS — E1169 Type 2 diabetes mellitus with other specified complication: Secondary | ICD-10-CM

## 2024-09-24 DIAGNOSIS — I152 Hypertension secondary to endocrine disorders: Secondary | ICD-10-CM

## 2024-09-24 DIAGNOSIS — Z0000 Encounter for general adult medical examination without abnormal findings: Secondary | ICD-10-CM

## 2024-09-24 DIAGNOSIS — K2100 Gastro-esophageal reflux disease with esophagitis, without bleeding: Secondary | ICD-10-CM | POA: Diagnosis not present

## 2024-09-24 DIAGNOSIS — E1159 Type 2 diabetes mellitus with other circulatory complications: Secondary | ICD-10-CM | POA: Diagnosis not present

## 2024-09-24 DIAGNOSIS — Z0001 Encounter for general adult medical examination with abnormal findings: Secondary | ICD-10-CM

## 2024-09-24 DIAGNOSIS — E1142 Type 2 diabetes mellitus with diabetic polyneuropathy: Secondary | ICD-10-CM | POA: Diagnosis not present

## 2024-09-24 DIAGNOSIS — E785 Hyperlipidemia, unspecified: Secondary | ICD-10-CM | POA: Diagnosis not present

## 2024-09-24 DIAGNOSIS — K5900 Constipation, unspecified: Secondary | ICD-10-CM | POA: Diagnosis not present

## 2024-09-24 DIAGNOSIS — R55 Syncope and collapse: Secondary | ICD-10-CM

## 2024-09-24 DIAGNOSIS — Z8673 Personal history of transient ischemic attack (TIA), and cerebral infarction without residual deficits: Secondary | ICD-10-CM

## 2024-09-24 DIAGNOSIS — Z91199 Patient's noncompliance with other medical treatment and regimen due to unspecified reason: Secondary | ICD-10-CM

## 2024-09-24 DIAGNOSIS — E663 Overweight: Secondary | ICD-10-CM

## 2024-09-24 LAB — BAYER DCA HB A1C WAIVED: HB A1C (BAYER DCA - WAIVED): 10.2 % — ABNORMAL HIGH (ref 4.8–5.6)

## 2024-09-24 LAB — LIPID PANEL

## 2024-09-24 NOTE — Patient Instructions (Signed)

## 2024-09-24 NOTE — Progress Notes (Signed)
 Subjective:    Patient ID: Renee [name], female    DOB: 1951/02/02, 73 y.o.   MRN: [id number]  Chief Complaint  Patient presents with   Medical Management of Chronic Issues   Pt presents to the office today for CPE.    Her DM is uncontrolled and is followed by Podiatry every 8 weeks. She has seen Endocrinologists, but states she has not seen them in awhile because of transportation. Her last A1C was 10.7.   She refuses any medication changes because she is scared of side effects. She has stopped her Januvia  because it was not working.    She has diabetic neuropathy and takes gabapentin  400 mg TID. States her pain is an aching, burning  pain of 2 out 10 with medications.    Has hx of CVA.  Hypertension This is a chronic problem. The current episode started more than 1 year ago. The problem has been resolved since onset. The problem is controlled. Associated symptoms include blurred vision. Pertinent negatives include no malaise/fatigue, peripheral edema or shortness of breath. Risk factors for coronary artery disease include dyslipidemia, diabetes mellitus and sedentary lifestyle. The current treatment provides moderate improvement. Hypertensive end-organ damage includes CVA.  Gastroesophageal Reflux She complains of belching and heartburn. This is a chronic problem. The current episode started more than 1 year ago. The problem occurs occasionally. The symptoms are aggravated by certain foods. She has tried a PPI for the symptoms. The treatment provided moderate relief.  Hyperlipidemia This is a chronic problem. The current episode started more than 1 year ago. The problem is uncontrolled. Recent lipid tests were reviewed and are high. Exacerbating diseases include obesity. Pertinent negatives include no shortness of breath. Current antihyperlipidemic treatment includes statins. The current treatment provides moderate improvement of lipids. Risk factors for coronary artery disease include  dyslipidemia, diabetes mellitus, hypertension, a sedentary lifestyle and post-menopausal.  Diabetes She presents for her follow-up diabetic visit. She has type 2 diabetes mellitus. Associated symptoms include blurred vision and foot paresthesias. Symptoms are stable. Diabetic complications include a CVA and peripheral neuropathy. Risk factors for coronary artery disease include dyslipidemia, diabetes mellitus, hypertension, sedentary lifestyle and post-menopausal. She is following a generally unhealthy diet. Her overall blood glucose range is 180-200 mg/dl.      Review of Systems  Constitutional:  Negative for malaise/fatigue.  Eyes:  Positive for blurred vision.  Respiratory:  Negative for shortness of breath.   Gastrointestinal:  Positive for heartburn.  All other systems reviewed and are negative.  Family History  Problem Relation Age of Onset   Alzheimer's disease Mother    Parkinson's disease Mother    Heart Problems Father    Kidney disease Father        +dialysis   Heart disease Father    Other Sister        +TAH for fibroids   Breast cancer Sister        dx. 55-56   Renal cancer Sister        dx. 62-63; not a smoker   Early death Brother    Diabetes Brother    Cancer Paternal Aunt        NOS cancer   Colon cancer Neg Hx    Stomach cancer Neg Hx    Social History   Socioeconomic History   Marital status: Single    Spouse name: Not on file   Number of children: Not on file   Years of education: 12   Highest  education level: Associate degree: academic program  Occupational History   Occupation: Retired    Associate Professor: [employer]    Comment: [employer]   Tobacco Use   Smoking status: Never   Smokeless tobacco: Never  Vaping Use   Vaping status: Never Used  Substance and Sexual Activity   Alcohol use: Not Currently    Alcohol/week: 1.0 standard drink of alcohol    Types: 1 Glasses of wine per week   Drug use: No   Sexual  activity: Not Currently    Birth control/protection: Post-menopausal  Other Topics Concern   Not on file  Social History Narrative   Retired from school system, single, no children. Enjoys reading and word puzzles.    Jehovah's Witness - goes to [employer] get together [employer], Saturdays   Her parents left their home to her and her sister      Right hand   Living alone, 1 step by the hallway      Social Drivers of Health   Financial Resource Strain: Low Risk  (07/25/2023)   Overall Financial Resource Strain (CARDIA)    Difficulty of Paying Living Expenses: Not hard at all  Food Insecurity: No Food Insecurity (08/03/2024)   Hunger Vital Sign    Worried About Running Out of Food in the Last Year: Never true    Ran Out of Food in the Last Year: Never true  Transportation Needs: No Transportation Needs (08/03/2024)   PRAPARE - Administrator, Civil Service (Medical): No    Lack of Transportation (Non-Medical): No  Physical Activity: Insufficiently Active (07/25/2023)   Exercise Vital Sign    Days of Exercise per Week: 3 days    Minutes of Exercise per Session: 30 min  Stress: No Stress Concern Present (07/25/2023)   [employer] of Occupational Health - Occupational Stress Questionnaire    Feeling of Stress : Not at all  Social Connections: Moderately Isolated (07/25/2023)   Social Connection and Isolation Panel    Frequency of Communication with Friends and Family: More than three times a week    Frequency of Social Gatherings with Friends and Family: More than three times a week    Attends Religious Services: More than 4 times per year    Active Member of [employer] or Organizations: No    Attends Banker Meetings: Never    Marital Status: Never married       Objective:   Physical Exam Vitals reviewed.  Constitutional:      General: She is not in acute distress.    Appearance: She is well-developed.  HENT:     Head: Normocephalic and  atraumatic.     Right Ear: Tympanic membrane normal.     Left Ear: Tympanic membrane normal.  Eyes:     Pupils: Pupils are equal, round, and reactive to light.  Neck:     Thyroid : No thyromegaly.  Cardiovascular:     Rate and Rhythm: Normal rate and regular rhythm.     Heart sounds: Normal heart sounds. No murmur heard. Pulmonary:     Effort: Pulmonary effort is normal. No respiratory distress.     Breath sounds: Normal breath sounds. No wheezing.  Abdominal:     General: Bowel sounds are normal. There is no distension.     Palpations: Abdomen is soft.     Tenderness: There is no abdominal tenderness.  Musculoskeletal:        General:  No tenderness. Normal range of motion.     Cervical back: Normal range of motion and neck supple.  Skin:    General: Skin is warm and dry.  Neurological:     Mental Status: She is alert and oriented to person, place, and time.     Cranial Nerves: No cranial nerve deficit.     Deep Tendon Reflexes: Reflexes are normal and symmetric.  Psychiatric:        Behavior: Behavior normal.        Thought Content: Thought content normal.        Judgment: Judgment normal.    Diabetic Foot Exam - Simple   Simple Foot Form Diabetic Foot exam was performed with the following findings: Yes 09/24/2024  9:18 AM  Visual Inspection No deformities, no ulcerations, no other skin breakdown bilaterally: Yes Sensation Testing Intact to touch and monofilament testing bilaterally: Yes Pulse Check Posterior Tibialis and Dorsalis pulse intact bilaterally: Yes Comments Toenails thick and discolored        BP 110/68   Pulse 69   Temp 97.6 F (36.4 C) (Temporal)   Ht 5' 6 (1.676 m)   Wt 159 lb 9.6 oz (72.4 kg)   BMI 25.76 kg/m      Assessment & Plan:  JAMICA WOODYARD comes in today with chief complaint of Medical Management of Chronic Issues   Diagnosis and orders addressed:  1. Annual physical exam (Primary) - Bayer DCA Hb A1c Waived - CBC with  Differential/Platelet - Lipid panel - CMP14+EGFR - Microalbumin / creatinine urine ratio  2. Hyperlipidemia associated with type 2 diabetes mellitus (HCC) - CBC with Differential/Platelet - Lipid panel - CMP14+EGFR  3. Hypertension associated with diabetes (HCC) - CBC with Differential/Platelet - CMP14+EGFR  4. History of CVA (cerebrovascular accident)  - CBC with Differential/Platelet - CMP14+EGFR  5. Type 2 diabetes mellitus with diabetic polyneuropathy, without long-term current use of insulin  (HCC) - Bayer DCA Hb A1c Waived - CBC with Differential/Platelet - CMP14+EGFR - Microalbumin / creatinine urine ratio  6. Diabetic peripheral neuropathy (HCC)  - CBC with Differential/Platelet - CMP14+EGFR  7. Overweight (BMI 25.0-29.9) - CBC with Differential/Platelet - CMP14+EGFR  8. Constipation, unspecified constipation type - CBC with Differential/Platelet - CMP14+EGFR  9. Essential hypertension, benign  - CBC with Differential/Platelet - CMP14+EGFR  10. Non-compliance - CBC with Differential/Platelet - CMP14+EGFR  11. Gastroesophageal reflux disease with esophagitis, unspecified whether hemorrhage - CBC with Differential/Platelet - CMP14+EGFR  A1C elevated, but refuses any medications changes because she is scared of adverse effects.  Labs pending Strict low carb diet  Continue medications, pt refuses any medication changes at this time.  Health Maintenance reviewed- Refuses all vaccines Diet and exercise encouraged  Follow up plan: 4 months    Bari Learn, FNP

## 2024-09-25 ENCOUNTER — Ambulatory Visit: Payer: Self-pay | Admitting: Family

## 2024-09-25 ENCOUNTER — Ambulatory Visit: Payer: Self-pay

## 2024-09-25 DIAGNOSIS — E1142 Type 2 diabetes mellitus with diabetic polyneuropathy: Secondary | ICD-10-CM | POA: Diagnosis not present

## 2024-09-25 DIAGNOSIS — M79674 Pain in right toe(s): Secondary | ICD-10-CM | POA: Diagnosis not present

## 2024-09-25 DIAGNOSIS — M79675 Pain in left toe(s): Secondary | ICD-10-CM | POA: Diagnosis not present

## 2024-09-25 DIAGNOSIS — L84 Corns and callosities: Secondary | ICD-10-CM | POA: Diagnosis not present

## 2024-09-25 DIAGNOSIS — B351 Tinea unguium: Secondary | ICD-10-CM | POA: Diagnosis not present

## 2024-09-25 LAB — CBC WITH DIFFERENTIAL/PLATELET
Basophils Absolute: 0.1 x10E3/uL (ref 0.0–0.2)
Basos: 2 %
EOS (ABSOLUTE): 0.1 x10E3/uL (ref 0.0–0.4)
Eos: 2 %
Hematocrit: 40.6 % (ref 34.0–46.6)
Hemoglobin: 13 g/dL (ref 11.1–15.9)
Immature Grans (Abs): 0 x10E3/uL (ref 0.0–0.1)
Immature Granulocytes: 0 %
Lymphocytes Absolute: 1.9 x10E3/uL (ref 0.7–3.1)
Lymphs: 35 %
MCH: 28.8 pg (ref 26.6–33.0)
MCHC: 32 g/dL (ref 31.5–35.7)
MCV: 90 fL (ref 79–97)
Monocytes Absolute: 0.5 x10E3/uL (ref 0.1–0.9)
Monocytes: 9 %
Neutrophils Absolute: 2.8 x10E3/uL (ref 1.4–7.0)
Neutrophils: 52 %
Platelets: 333 x10E3/uL (ref 150–450)
RBC: 4.52 x10E6/uL (ref 3.77–5.28)
RDW: 12.9 % (ref 11.7–15.4)
WBC: 5.3 x10E3/uL (ref 3.4–10.8)

## 2024-09-25 LAB — CMP14+EGFR
ALT: 18 IU/L (ref 0–32)
AST: 22 IU/L (ref 0–40)
Albumin: 4.5 g/dL (ref 3.8–4.8)
Alkaline Phosphatase: 108 IU/L (ref 49–135)
BUN/Creatinine Ratio: 23 (ref 12–28)
BUN: 33 mg/dL — AB (ref 8–27)
Bilirubin Total: 0.4 mg/dL (ref 0.0–1.2)
CO2: 21 mmol/L (ref 20–29)
Calcium: 10.5 mg/dL — AB (ref 8.7–10.3)
Chloride: 103 mmol/L (ref 96–106)
Creatinine, Ser: 1.44 mg/dL — AB (ref 0.57–1.00)
Globulin, Total: 3.3 g/dL (ref 1.5–4.5)
Glucose: 178 mg/dL — AB (ref 70–99)
Potassium: 4.8 mmol/L (ref 3.5–5.2)
Sodium: 141 mmol/L (ref 134–144)
Total Protein: 7.8 g/dL (ref 6.0–8.5)
eGFR: 38 mL/min/1.73 — AB (ref >59)

## 2024-09-25 LAB — LIPID PANEL
Cholesterol, Total: 160 mg/dL (ref 100–199)
HDL: 48 mg/dL (ref >39)
LDL CALC COMMENT:: 3.3 ratio (ref 0.0–4.4)
LDL Chol Calc (NIH): 90 mg/dL (ref 0–99)
Triglycerides: 124 mg/dL (ref 0–149)
VLDL Cholesterol Cal: 22 mg/dL (ref 5–40)

## 2024-09-25 LAB — MICROALBUMIN / CREATININE URINE RATIO
Creatinine, Urine: 81.3 mg/dL
Microalb/Creat Ratio: 26 mg/g{creat} (ref 0–29)
Microalbumin, Urine: 21.5 ug/mL

## 2024-09-25 NOTE — Telephone Encounter (Signed)
 FYI Only or Action Required?: FYI only for provider.  Patient was last seen in primary care on 09/24/2024 by Lavell Bari LABOR, FNP.  Called Nurse Triage reporting Results.  Symptoms began na.  Interventions attempted: Other: na.  Symptoms are: na.  Triage Disposition: No disposition on file.  Patient/caregiver understands and will follow disposition?:   Copied from CRM [id number]. Topic: Clinical - Lab/Test Results >> Sep 25, 2024  3:10 PM Lauren C wrote: Reason for CRM: Pt returning call for lab results. Results relayed. Patient has further questions about her A1c numbers and what this means. A CRM was sent to admin to schedule with Mliss the pharmacist per result note. Reason for Disposition  [1] Other NON-URGENT information for PCP AND [2] does not require PCP response  Answer Assessment - Initial Assessment Questions 1. REASON FOR CALL or QUESTION: What is your reason for calling today? or How can I best   Patient given results of recent Lab work. Had question about HA1C and what the level means. Patient verbalized understanding that her A1C is elevated and needing better glucose control. Reviewed current regimen and that patient will be contacted by the office to schedule appt with Mliss in person. She is unable to take Metformin due to severe reaction. No further needs at this time.  Protocols used: PCP Call - No Triage-A-AH

## 2024-09-26 ENCOUNTER — Telehealth: Payer: Self-pay | Admitting: Pharmacist

## 2024-09-26 DIAGNOSIS — E1142 Type 2 diabetes mellitus with diabetic polyneuropathy: Secondary | ICD-10-CM

## 2024-09-26 NOTE — Telephone Encounter (Signed)
   A1c persistently elevated and GFR declining.  PCP requesting Pharmacy medication management for diabetes.  Will place a [id number] for diabetes management.   Tinisha Etzkorn Dattero Ngan Qualls, PharmD, BCACP, CPP Clinical Pharmacist, [facility]

## 2024-09-27 ENCOUNTER — Telehealth: Payer: Self-pay

## 2024-09-27 NOTE — Progress Notes (Signed)
 Care Guide Pharmacy Note  09/27/2024 Name: TRISTY UDOVICH MRN: [id number] DOB: Dec 13, 1951  Referred By: Lavell Bari LABOR, FNP Reason for referral: Complex Care Management (Outreach to schedule with Pharm d )   MARGI EDMUNDSON is a 73 y.o. year old female who is a primary care patient of Lavell Bari LABOR, FNP.  VASSIE KUGEL was referred to the pharmacist for assistance related to: DMII  Successful contact was made with the patient to discuss pharmacy services.  Patient declines engagement at this time. Contact information was provided to the patient should they wish to reach out for assistance at a later time.  Jeoffrey Buffalo , RMA     [facility]  [facility], [place] Guide  Direct Dial : [phone]  Website: [email or web address]

## 2024-10-10 ENCOUNTER — Other Ambulatory Visit: Payer: Self-pay | Admitting: *Deleted

## 2024-10-10 NOTE — Patient Instructions (Signed)
 Visit Information  Thank you for taking time to visit with me today. Please don't hesitate to contact me if I can be of assistance to you before our next scheduled appointment.  Your next care management appointment is by telephone on 11-15-2024 at 9:30 am  Telephone follow-up in 1 month  Please call the care guide team at [phone] if you need to cancel, schedule, or reschedule an appointment.   Please call the Suicide and Crisis Lifeline: 988 call the [place]  National Suicide Prevention Lifeline: (401)486-8494 or TTY: 651-177-3669 TTY 2701784196) to talk to a trained counselor call 1-800-273-TALK (toll free, 24 hour hotline) if you are experiencing a Mental Health or Behavioral Health Crisis or need someone to talk to.  Rosina Forte, BSN RN [facility], [facility] RN Care Manager Direct Dial : [phone]  Fax: [phone]

## 2024-10-10 NOTE — Patient Outreach (Signed)
 Complex Care Management   Visit Note  10/10/2024  Name:  Renee [name] MRN: [id number] DOB: Jul 30, 1951  Situation: Referral received for Complex Care Management related to Diabetes with Complications and HTN I obtained verbal consent from Patient.  Visit completed with Patient  on the phone  Background:   Past Medical History:  Diagnosis Date   Arthritis    Breast cancer (HCC) 2001   right mastectomy   CVA (cerebral infarction) 2003, 2005   DM (diabetes mellitus) (HCC)    GERD (gastroesophageal reflux disease)    Heart murmur    Hyperlipidemia    Hypertension    Seizures (HCC)    Vitamin D  deficiency     Assessment: Patient Reported Symptoms:  Cognitive Cognitive Status: Able to follow simple commands Cognitive/Intellectual Conditions Management [RPT]: None reported or documented in medical history or problem list   Health Maintenance Behaviors: Spiritual practice(s), Annual physical exam Healing Pattern: Average Health Facilitated by: Prayer/meditation, Rest  Neurological Neurological Review of Symptoms: No symptoms reported Neurological Management Strategies: Routine screening Neurological Self-Management Outcome: 4 (good)  HEENT HEENT Symptoms Reported: Tearing HEENT Management Strategies: Routine screening HEENT Self-Management Outcome: 4 (good)    Cardiovascular Cardiovascular Symptoms Reported: No symptoms reported Does patient have uncontrolled Hypertension?: No Is patient checking Blood Pressure at home?: Yes Patient's Recent BP reading at home: 115/47 Cardiovascular Management Strategies: Routine screening, Medication therapy  Respiratory Respiratory Symptoms Reported: No symptoms reported Respiratory Management Strategies: Routine screening Respiratory Self-Management Outcome: 4 (good)  Endocrine Endocrine Symptoms Reported: No symptoms reported Is patient diabetic?: Yes Is patient checking blood sugars at home?: Yes List most recent blood sugar  readings, include date and time of day: 10-10-2024 0800 192 Endocrine Self-Management Outcome: 3 (uncertain)  Gastrointestinal Gastrointestinal Symptoms Reported: No symptoms reported Gastrointestinal Self-Management Outcome: 4 (good)    Genitourinary Genitourinary Symptoms Reported: No symptoms reported Genitourinary Self-Management Outcome: 4 (good)  Integumentary Integumentary Symptoms Reported: No symptoms reported Skin Self-Management Outcome: 4 (good)  Musculoskeletal Musculoskelatal Symptoms Reviewed: No symptoms reported Musculoskeletal Self-Management Outcome: 4 (good) Falls in the past year?: No Number of falls in past year: 1 or less Was there an injury with Fall?: No Fall Risk Category Calculator: 0 Patient Fall Risk Level: Low Fall Risk Patient at Risk for Falls Due to: No Fall Risks Fall risk Follow up: Falls evaluation completed  Psychosocial Psychosocial Symptoms Reported: No symptoms reported Behavioral Management Strategies: Coping strategies Behavioral Health Self-Management Outcome: 4 (good) Major Change/Loss/Stressor/Fears (CP): Denies Techniques to Cope with Loss/Stress/Change: Not applicable      10/10/2024    PHQ2-9 Depression Screening   Little interest or pleasure in doing things Not at all  Feeling down, depressed, or hopeless Not at all  PHQ-2 - Total Score 0  Trouble falling or staying asleep, or sleeping too much    Feeling tired or having little energy    Poor appetite or overeating     Feeling bad about yourself - or that you are a failure or have let yourself or your family down    Trouble concentrating on things, such as reading the newspaper or watching television    Moving or speaking so slowly that other people could have noticed.  Or the opposite - being so fidgety or restless that you have been moving around a lot more than usual    Thoughts that you would be better off dead, or hurting yourself in some way    PHQ2-9 Total Score    If you  checked off any problems, how difficult have these problems made it for you to do your work, take care of things at home, or get along with other people    Depression Interventions/Treatment      Vitals:   10/10/24 0950  BP: (!) 115/47    Medications Reviewed Today     Reviewed by Bertrum Rosina HERO, RN (Registered Nurse) on 10/10/24 at 0945  Med List Status: <None>   Medication Order Taking? Sig Documenting Provider Last Dose Status Informant  Accu-Chek Softclix Lancets lancets [id number] Yes CHECK BLOOD SUGAR TWICE DAILY Dx E11.9 Lavell Lye A, FNP  Active   aspirin 81 MG tablet [id number] Yes Take 81 mg by mouth daily. [provider]  Active Self  atorvastatin  (LIPITOR) 40 MG tablet [id number] Yes Take 1 tablet (40 mg total) by mouth daily. Lavell Lye LABOR, FNP  Active   Blood Glucose Monitoring Suppl (ACCU-CHEK GUIDE ME) w/Device KIT [id number] Yes CHECK BLOOD SUGAR TWICE DAILY Dx E11.9 Lavell Lye A, FNP  Active   Cholecalciferol (VITAMIN D3) 10 MCG (400 UNIT) CAPS [id number] Yes Take by mouth. [provider]  Active   dapagliflozin  propanediol (FARXIGA ) 10 MG TABS tablet [id number] Yes Take 1 tablet (10 mg total) by mouth daily before breakfast. Lavell Lye A, FNP  Active   docusate sodium (COLACE) 100 MG capsule [id number] Yes Take 100 mg by mouth daily. [provider]  Active Self  gabapentin  (NEURONTIN ) 400 MG capsule [id number] Yes Take 1 capsule (400 mg total) by mouth 3 (three) times daily. Lavell Lye A, FNP  Active   glipiZIDE  (GLUCOTROL ) 10 MG tablet [id number] Yes Take 1 tablet (10 mg total) by mouth 2 (two) times daily before a meal. Lavell Lye A, FNP  Active   glucose blood (ACCU-CHEK GUIDE TEST) test strip [id number] Yes CHECK BLOOD SUGAR TWICE DAILY Dx E11.9 Lavell Lye A, FNP  Active   omeprazole  (PRILOSEC) 20 MG capsule [id number] Yes Take 1 capsule (20 mg total) by mouth daily. Lavell Lye A, FNP  Active    valsartan -hydrochlorothiazide  (DIOVAN -HCT) 160-25 MG tablet [id number] Yes Take 1 tablet by mouth daily. Lavell Lye LABOR, FNP  Active             Recommendation:   Referral to: Dietitian - patient declines  Continue Current Plan of Care  Follow Up Plan:   Telephone follow-up in 1 month  Rosina Bertrum, BSN RN [facility], [facility] RN Care Manager Direct Dial : [phone]  Fax: [phone]

## 2024-10-15 ENCOUNTER — Other Ambulatory Visit: Payer: Self-pay | Admitting: Family

## 2024-10-15 DIAGNOSIS — Z1231 Encounter for screening mammogram for malignant neoplasm of breast: Secondary | ICD-10-CM

## 2024-10-16 NOTE — Progress Notes (Signed)
 Renee [name]                                          MRN: [id number]   10/16/2024   The [facility] Quality Team Specialist reviewed this patient medical record for the purposes of chart review for care gap closure. The following were reviewed: chart review for care gap closure-glycemic status assessment.    [facility] Quality Team

## 2024-10-17 ENCOUNTER — Other Ambulatory Visit: Payer: Self-pay | Admitting: Family

## 2024-10-17 ENCOUNTER — Ambulatory Visit
Admission: RE | Admit: 2024-10-17 | Discharge: 2024-10-17 | Disposition: A | Source: Ambulatory Visit | Attending: Family | Admitting: Family

## 2024-10-17 ENCOUNTER — Encounter

## 2024-10-17 DIAGNOSIS — Z1231 Encounter for screening mammogram for malignant neoplasm of breast: Secondary | ICD-10-CM

## 2024-11-12 ENCOUNTER — Telehealth: Admitting: *Deleted

## 2024-11-14 ENCOUNTER — Encounter: Payer: Self-pay | Admitting: *Deleted

## 2024-11-14 NOTE — Progress Notes (Signed)
 Renee [name]                                          MRN: [id number]   11/14/2024   The [facility] Quality Team Specialist reviewed this patient medical record for the purposes of chart review for care gap closure. The following were reviewed: abstraction for care gap closure-kidney health evaluation for diabetes:eGFR  and uACR.    [facility] Quality Team

## 2024-11-15 ENCOUNTER — Encounter: Payer: Self-pay | Admitting: *Deleted

## 2024-11-15 ENCOUNTER — Other Ambulatory Visit: Payer: Self-pay | Admitting: *Deleted

## 2024-11-15 NOTE — Patient Outreach (Addendum)
 Complex Care Management   Visit Note  11/15/2024  Name:  Renee [name] MRN: [id number] DOB: [birth year]  Situation: Referral received for Complex Care Management related to HTN and Diabetes I obtained verbal consent from Patient.  Visit completed with Patient  on the phone  Background:   Past Medical History:  Diagnosis Date   Arthritis    Breast cancer (HCC) 2001   right mastectomy   CVA (cerebral infarction) 2003, 2005   DM (diabetes mellitus) (HCC)    GERD (gastroesophageal reflux disease)    Heart murmur    Hyperlipidemia    Hypertension    Seizures (HCC)    Vitamin D  deficiency     Assessment: Patient Reported Symptoms:  Cognitive Cognitive Status: No symptoms reported Cognitive/Intellectual Conditions Management [RPT]: None reported or documented in medical history or problem list   Healing Pattern: Average Health Facilitated by: Prayer/meditation  Neurological Neurological Review of Symptoms: No symptoms reported Neurological Management Strategies: Routine screening Neurological Self-Management Outcome: 4 (good)  HEENT HEENT Symptoms Reported: No symptoms reported HEENT Management Strategies: Routine screening HEENT Self-Management Outcome: 4 (good)    Cardiovascular Cardiovascular Symptoms Reported: No symptoms reported Does patient have uncontrolled Hypertension?: Yes Is patient checking Blood Pressure at home?: Yes Patient's Recent BP reading at home: 132/70 Cardiovascular Management Strategies: Adequate rest, Coping strategies Weight: 162 lb (73.5 kg) Cardiovascular Self-Management Outcome: 4 (good)  Respiratory Respiratory Symptoms Reported: No symptoms reported Respiratory Management Strategies: Routine screening Respiratory Self-Management Outcome: 4 (good)  Endocrine Endocrine Symptoms Reported: No symptoms reported Is patient checking blood sugars at home?: Yes List most recent blood sugar readings, include date and time of day: 11/15/24  168 Endocrine Self-Management Outcome: 3 (uncertain) Endocrine Comment: 224 HIGHEST LOWEST 135  Gastrointestinal Gastrointestinal Symptoms Reported: No symptoms reported Gastrointestinal Management Strategies: Medication therapy Gastrointestinal Self-Management Outcome: 4 (good)    Genitourinary Genitourinary Symptoms Reported: No symptoms reported Genitourinary Management Strategies: Adequate rest Genitourinary Self-Management Outcome: 4 (good)  Integumentary Integumentary Symptoms Reported: No symptoms reported Skin Management Strategies: Routine screening Skin Self-Management Outcome: 4 (good)  Musculoskeletal Musculoskelatal Symptoms Reviewed: No symptoms reported Musculoskeletal Management Strategies: Routine screening Musculoskeletal Self-Management Outcome: 4 (good) Falls in the past year?: No Number of falls in past year: 1 or less Was there an injury with Fall?: No Fall Risk Category Calculator: 0 Patient Fall Risk Level: Low Fall Risk Patient at Risk for Falls Due to: No Fall Risks Fall risk Follow up: Education provided  Psychosocial Psychosocial Symptoms Reported: No symptoms reported Behavioral Health Self-Management Outcome: 4 (good)   Do you feel physically threatened by others?: No    11/15/2024    PHQ2-9 Depression Screening   Little interest or pleasure in doing things Not at all  Feeling down, depressed, or hopeless Not at all  PHQ-2 - Total Score 0  Trouble falling or staying asleep, or sleeping too much    Feeling tired or having little energy    Poor appetite or overeating     Feeling bad about yourself - or that you are a failure or have let yourself or your family down    Trouble concentrating on things, such as reading the newspaper or watching television    Moving or speaking so slowly that other people could have noticed.  Or the opposite - being so fidgety or restless that you have been moving around a lot more than usual    Thoughts that you would  be better off dead, or hurting yourself in some way  PHQ2-9 Total Score    If you checked off any problems, how difficult have these problems made it for you to do your work, take care of things at home, or get along with other people    Depression Interventions/Treatment      Today's Vitals   11/15/24 0939  BP: 132/70  Pulse: 66  Weight: 162 lb (73.5 kg)   Pain Scale: 0-10 Pain Score: 0-No pain  Medications Reviewed Today     Reviewed by Nivia Queen Abbett , RN (Registered Nurse) on 11/15/24 at [phone]  Med List Status: <None>   Medication Order Taking? Sig Documenting Provider Last Dose Status Informant  Accu-Chek Softclix Lancets lancets [id number] Yes CHECK BLOOD SUGAR TWICE DAILY Dx E11.9 Lavell Lye A, FNP  Active   aspirin 81 MG tablet [id number] Yes Take 81 mg by mouth daily. [provider]  Active Self  atorvastatin  (LIPITOR) 40 MG tablet [id number] Yes Take 1 tablet (40 mg total) by mouth daily. Lavell Lye LABOR, FNP  Active   Blood Glucose Monitoring Suppl (ACCU-CHEK GUIDE ME) w/Device KIT [id number] Yes CHECK BLOOD SUGAR TWICE DAILY Dx E11.9 Lavell Lye A, FNP  Active   Cholecalciferol (VITAMIN D3) 10 MCG (400 UNIT) CAPS [id number] Yes Take by mouth. [provider]  Active   dapagliflozin  propanediol (FARXIGA ) 10 MG TABS tablet [id number] Yes Take 1 tablet (10 mg total) by mouth daily before breakfast. Lavell Lye A, FNP  Active   docusate sodium (COLACE) 100 MG capsule [id number] Yes Take 100 mg by mouth daily. [provider]  Active Self  gabapentin  (NEURONTIN ) 400 MG capsule [id number] Yes Take 1 capsule (400 mg total) by mouth 3 (three) times daily. Lavell Lye A, FNP  Active   glipiZIDE  (GLUCOTROL ) 10 MG tablet [id number] Yes Take 1 tablet (10 mg total) by mouth 2 (two) times daily before a meal. Lavell Lye A, FNP  Active   glucose blood (ACCU-CHEK GUIDE TEST) test strip [id number] Yes CHECK BLOOD SUGAR TWICE DAILY Dx E11.9 Lavell Lye A,  FNP  Active   omeprazole  (PRILOSEC) 20 MG capsule [id number] Yes Take 1 capsule (20 mg total) by mouth daily. Lavell Lye A, FNP  Active   valsartan -hydrochlorothiazide  (DIOVAN -HCT) 160-25 MG tablet [id number] Yes Take 1 tablet by mouth daily. Lavell Lye LABOR, FNP  Active             Recommendation:   Continue Current Plan of Care  Follow Up Plan:   Telephone follow-up in 1 month  Keyonna Comunale RN RN Care Manager [facility] [phone]

## 2024-11-15 NOTE — Patient Instructions (Signed)
 Visit Information  Thank you for taking time to visit with me today. Please don't hesitate to contact me if I can be of assistance to you before our next scheduled appointment.  Your next care management appointment is by telephone on 12/14/24 at 10:00AM  Telephone follow-up in 1 month  Please call the care guide team at [phone] if you need to cancel, schedule, or reschedule an appointment.   Please call the Suicide and Crisis Lifeline: 988 call the [place]  National Suicide Prevention Lifeline: 930-768-4779 or TTY: 807-411-2921 TTY 319-699-6601) to talk to a trained counselor call 1-800-273-TALK (toll free, 24 hour hotline) if you are experiencing a Mental Health or Behavioral Health Crisis or need someone to talk Kaileb Monsanto RN RN Care Manager [facility] [phone]

## 2024-12-12 ENCOUNTER — Telehealth: Payer: Self-pay

## 2024-12-12 NOTE — Telephone Encounter (Signed)
 Patient was identified as falling into the True [place] Measure - Diabetes.   Patient was: Appointment already scheduled for:  01/24/2025.

## 2024-12-13 ENCOUNTER — Telehealth: Payer: Self-pay

## 2024-12-13 NOTE — Telephone Encounter (Signed)
 Patient was identified as falling into the True [place] Measure - Diabetes.   Patient was: Appointment already scheduled for:  01/24/25.

## 2024-12-14 ENCOUNTER — Telehealth: Admitting: *Deleted

## 2024-12-14 NOTE — Patient Outreach (Signed)
 Complex Care Management   Visit Note  12/14/2024  Name:  Renee [name] MRN: [id number] DOB: [birth year]  Situation: Referral received for Complex Care Management related to Diabetes with Complications and HTN I obtained verbal consent from Patient.  Visit completed with Patient  on the phone  Background:   Past Medical History:  Diagnosis Date   Arthritis    Breast cancer (HCC) 2001   right mastectomy   CVA (cerebral infarction) 2003, 2005   DM (diabetes mellitus) (HCC)    GERD (gastroesophageal reflux disease)    Heart murmur    Hyperlipidemia    Hypertension    Seizures (HCC)    Vitamin D  deficiency     Assessment: Patient Reported Symptoms:  Cognitive Cognitive Status: No symptoms reported Cognitive/Intellectual Conditions Management [RPT]: None reported or documented in medical history or problem list   Health Maintenance Behaviors: Spiritual practice(s) Healing Pattern: Average Health Facilitated by: Prayer/meditation, Rest  Neurological Neurological Review of Symptoms: No symptoms reported Neurological Self-Management Outcome: 5 (very good)  HEENT   HEENT Management Strategies: Routine screening HEENT Self-Management Outcome: 5 (very good)    Cardiovascular Cardiovascular Symptoms Reported: No symptoms reported Does patient have uncontrolled Hypertension?: No Is patient checking Blood Pressure at home?: Yes Patient's Recent BP reading at home: 120/50 Cardiovascular Management Strategies: Routine screening Cardiovascular Self-Management Outcome: 4 (good)  Respiratory Respiratory Symptoms Reported: No symptoms reported Respiratory Management Strategies: Routine screening Respiratory Self-Management Outcome: 5 (very good)  Endocrine Endocrine Symptoms Reported: No symptoms reported Is patient diabetic?: Yes Is patient checking blood sugars at home?: Yes List most recent blood sugar readings, include date and time of day: 12-14-2024 CBG 146 Endocrine  Self-Management Outcome: 4 (good)  Gastrointestinal Gastrointestinal Symptoms Reported: No symptoms reported Gastrointestinal Self-Management Outcome: 5 (very good) Nutrition Risk Screen (CP): No indicators present  Genitourinary Genitourinary Symptoms Reported: No symptoms reported Genitourinary Self-Management Outcome: 5 (very good)  Integumentary Integumentary Symptoms Reported: No symptoms reported Skin Management Strategies: Routine screening Skin Self-Management Outcome: 5 (very good)  Musculoskeletal Musculoskelatal Symptoms Reviewed: No symptoms reported Musculoskeletal Self-Management Outcome: 5 (very good) Falls in the past year?: No Number of falls in past year: 1 or less Was there an injury with Fall?: No Fall Risk Category Calculator: 0 Patient Fall Risk Level: Low Fall Risk Patient at Risk for Falls Due to: No Fall Risks Fall risk Follow up: Falls evaluation completed  Psychosocial Psychosocial Symptoms Reported: No symptoms reported Behavioral Management Strategies: Coping strategies Behavioral Health Self-Management Outcome: 5 (very good) Major Change/Loss/Stressor/Fears (CP): Denies Techniques to Cope with Loss/Stress/Change: Spiritual practice(s)      12/14/2024    PHQ2-9 Depression Screening   Little interest or pleasure in doing things Not at all  Feeling down, depressed, or hopeless Not at all  PHQ-2 - Total Score 0  Trouble falling or staying asleep, or sleeping too much    Feeling tired or having little energy    Poor appetite or overeating     Feeling bad about yourself - or that you are a failure or have let yourself or your family down    Trouble concentrating on things, such as reading the newspaper or watching television    Moving or speaking so slowly that other people could have noticed.  Or the opposite - being so fidgety or restless that you have been moving around a lot more than usual    Thoughts that you would be better off dead, or hurting  yourself in some way  PHQ2-9 Total Score    If you checked off any problems, how difficult have these problems made it for you to do your work, take care of things at home, or get along with other people    Depression Interventions/Treatment      Today's Vitals   12/14/24 0949  BP: (!) 120/50  Pulse: 78   Pain Scale: 0-10 Pain Score: 0-No pain  Medications Reviewed Today     Reviewed by Bertrum Rosina HERO, RN (Registered Nurse) on 12/14/24 at [phone]  Med List Status: <None>   Medication Order Taking? Sig Documenting Provider Last Dose Status Informant  Accu-Chek Softclix Lancets lancets [id number] Yes CHECK BLOOD SUGAR TWICE DAILY Dx E11.9 Lavell Lye A, FNP  Active   aspirin 81 MG tablet [id number] Yes Take 81 mg by mouth daily. [provider]  Active Self  atorvastatin  (LIPITOR) 40 MG tablet [id number] Yes Take 1 tablet (40 mg total) by mouth daily. Lavell Lye LABOR, FNP  Active   Blood Glucose Monitoring Suppl (ACCU-CHEK GUIDE ME) w/Device KIT [id number] Yes CHECK BLOOD SUGAR TWICE DAILY Dx E11.9 Lavell Lye A, FNP  Active   Cholecalciferol (VITAMIN D3) 10 MCG (400 UNIT) CAPS [id number] Yes Take by mouth. [provider]  Active   dapagliflozin  propanediol (FARXIGA ) 10 MG TABS tablet [id number] Yes Take 1 tablet (10 mg total) by mouth daily before breakfast. Lavell Lye A, FNP  Active   docusate sodium (COLACE) 100 MG capsule [id number] Yes Take 100 mg by mouth daily. [provider]  Active Self  gabapentin  (NEURONTIN ) 400 MG capsule [id number] Yes Take 1 capsule (400 mg total) by mouth 3 (three) times daily. Lavell Lye A, FNP  Active   glipiZIDE  (GLUCOTROL ) 10 MG tablet [id number] Yes Take 1 tablet (10 mg total) by mouth 2 (two) times daily before a meal. Lavell Lye A, FNP  Active   glucose blood (ACCU-CHEK GUIDE TEST) test strip [id number] Yes CHECK BLOOD SUGAR TWICE DAILY Dx E11.9 Lavell Lye A, FNP  Active   omeprazole  (PRILOSEC) 20 MG capsule  [id number] Yes Take 1 capsule (20 mg total) by mouth daily. Lavell Lye A, FNP  Active   valsartan -hydrochlorothiazide  (DIOVAN -HCT) 160-25 MG tablet [id number] Yes Take 1 tablet by mouth daily. Lavell Lye LABOR, FNP  Active             Recommendation:   Continue Current Plan of Care  Follow Up Plan:   Telephone follow-up in 1 month  Rosina Bertrum, BSN RN [facility], [facility] RN Care Manager Direct Dial : [phone]  Fax: [phone]

## 2024-12-14 NOTE — Patient Instructions (Signed)
 Visit Information  Thank you for taking time to visit with me today. Please don't hesitate to contact me if I can be of assistance to you before our next scheduled appointment.  Your next care management appointment is by telephone on 01-15-2025 at 10:00 am  Telephone follow-up in 1 month  Please call the care guide team at [phone] if you need to cancel, schedule, or reschedule an appointment.   Please call the Suicide and Crisis Lifeline: 988 call the [place]  National Suicide Prevention Lifeline: (239)536-9055 or TTY: 847-076-3966 TTY 640-620-1579) to talk to a trained counselor call 1-800-273-TALK (toll free, 24 hour hotline) if you are experiencing a Mental Health or Behavioral Health Crisis or need someone to talk to.  Rosina Forte, BSN RN [facility], [facility] RN Care Manager Direct Dial : [phone]  Fax: [phone]

## 2024-12-25 NOTE — Progress Notes (Signed)
 Renee [name]                                          MRN: [id number]   12/25/2024   The [facility] Quality Team Specialist reviewed this patient medical record for the purposes of chart review for care gap closure. The following were reviewed: chart review for care gap closure-glycemic status assessment.    [facility] Quality Team

## 2024-12-31 NOTE — Progress Notes (Signed)
 Renee [name]                                          MRN: [id number]   12/31/2024   The [facility] Quality Team Specialist reviewed this patient medical record for the purposes of chart review for care gap closure. The following were reviewed: chart review for care gap closure-glycemic status assessment.    [facility] Quality Team

## 2025-01-15 ENCOUNTER — Other Ambulatory Visit: Payer: Self-pay | Admitting: *Deleted

## 2025-01-15 NOTE — Patient Instructions (Signed)
 Visit Information  Thank you for taking time to visit with me today. Please don't hesitate to contact me if I can be of assistance to you before our next scheduled appointment.  Your next care management appointment is by telephone on 02-12-2025 at 10:30 am  Telephone follow-up in 1 month  Please call the care guide team at [phone] if you need to cancel, schedule, or reschedule an appointment.   Please call the Suicide and Crisis Lifeline: 988 call the [place]  National Suicide Prevention Lifeline: 951-133-1242 or TTY: 503-004-5636 TTY 867-282-6839) to talk to a trained counselor call 1-800-273-TALK (toll free, 24 hour hotline) if you are experiencing a Mental Health or Behavioral Health Crisis or need someone to talk to.  Rosina Forte, BSN RN [facility], [facility] RN Care Manager Direct Dial : [phone]  Fax: [phone]

## 2025-01-15 NOTE — Patient Outreach (Signed)
 Complex Care Management   Visit Note  01/15/2025  Name:  Renee [name] MRN: [id number] DOB: 1951-03-05  Situation: Referral received for Complex Care Management related to Diabetes with Complications and HTN I obtained verbal consent from Patient.  Visit completed with Patient  on the phone  Background:   Past Medical History:  Diagnosis Date   Arthritis    Breast cancer (HCC) 2001   right mastectomy   CVA (cerebral infarction) 2003, 2005   DM (diabetes mellitus) (HCC)    GERD (gastroesophageal reflux disease)    Heart murmur    Hyperlipidemia    Hypertension    Seizures (HCC)    Vitamin D  deficiency     Assessment: Patient Reported Symptoms:  Cognitive Cognitive Status: No symptoms reported Cognitive/Intellectual Conditions Management [RPT]: None reported or documented in medical history or problem list   Health Maintenance Behaviors: Spiritual practice(s) Healing Pattern: Average Health Facilitated by: Prayer/meditation  Neurological Neurological Review of Symptoms: No symptoms reported Neurological Management Strategies: Routine screening Neurological Self-Management Outcome: 5 (very good)  HEENT HEENT Symptoms Reported: Tearing HEENT Management Strategies: Routine screening HEENT Self-Management Outcome: 4 (good)    Cardiovascular Cardiovascular Symptoms Reported: No symptoms reported Does patient have uncontrolled Hypertension?: No Is patient checking Blood Pressure at home?: Yes Patient's Recent BP reading at home: 115/54 Cardiovascular Management Strategies: Medication therapy, Routine screening Cardiovascular Self-Management Outcome: 4 (good)  Respiratory Respiratory Symptoms Reported: No symptoms reported Respiratory Management Strategies: Routine screening Respiratory Self-Management Outcome: 5 (very good)  Endocrine Endocrine Symptoms Reported: No symptoms reported Is patient diabetic?: Yes Is patient checking blood sugars at home?: Yes List most recent  blood sugar readings, include date and time of day: 01-14-2025 at 0615 CBG 135 Endocrine Self-Management Outcome: 4 (good)  Gastrointestinal Gastrointestinal Symptoms Reported: No symptoms reported Gastrointestinal Self-Management Outcome: 4 (good) Nutrition Risk Screen (CP): No indicators present  Genitourinary Genitourinary Symptoms Reported: No symptoms reported Genitourinary Self-Management Outcome: 4 (good)  Integumentary Integumentary Symptoms Reported: No symptoms reported Skin Management Strategies: Routine screening Skin Self-Management Outcome: 5 (very good)  Musculoskeletal Musculoskelatal Symptoms Reviewed: No symptoms reported Musculoskeletal Management Strategies: Routine screening Musculoskeletal Self-Management Outcome: 5 (very good) Falls in the past year?: No Number of falls in past year: 1 or less Was there an injury with Fall?: No Fall Risk Category Calculator: 0 Patient Fall Risk Level: Low Fall Risk Patient at Risk for Falls Due to: No Fall Risks Fall risk Follow up: Falls evaluation completed  Psychosocial Psychosocial Symptoms Reported: No symptoms reported Behavioral Management Strategies: Coping strategies Behavioral Health Self-Management Outcome: 5 (very good) Major Change/Loss/Stressor/Fears (CP): Denies Techniques to Cope with Loss/Stress/Change: Diversional activities, Spiritual practice(s)      01/15/2025    PHQ2-9 Depression Screening   Little interest or pleasure in doing things Not at all  Feeling down, depressed, or hopeless Not at all  PHQ-2 - Total Score 0  Trouble falling or staying asleep, or sleeping too much    Feeling tired or having little energy    Poor appetite or overeating     Feeling bad about yourself - or that you are a failure or have let yourself or your family down    Trouble concentrating on things, such as reading the newspaper or watching television    Moving or speaking so slowly that other people could have noticed.  Or  the opposite - being so fidgety or restless that you have been moving around a lot more than usual    Thoughts that  you would be better off dead, or hurting yourself in some way    PHQ2-9 Total Score    If you checked off any problems, how difficult have these problems made it for you to do your work, take care of things at home, or get along with other people    Depression Interventions/Treatment      Today's Vitals   01/15/25 1003  BP: (!) 115/54  Pulse: 74   Pain Scale: 0-10 Pain Score: 0-No pain  Medications Reviewed Today     Reviewed by Bertrum Rosina HERO, RN (Registered Nurse) on 01/15/25 at 1001  Med List Status: <None>   Medication Order Taking? Sig Documenting Provider Last Dose Status Informant  Accu-Chek Softclix Lancets lancets [id number] Yes CHECK BLOOD SUGAR TWICE DAILY Dx E11.9 Lavell Lye A, FNP  Active   aspirin 81 MG tablet [id number] Yes Take 81 mg by mouth daily. [provider]  Active Self  atorvastatin  (LIPITOR) 40 MG tablet [id number] Yes Take 1 tablet (40 mg total) by mouth daily. Lavell Lye LABOR, FNP  Active   Blood Glucose Monitoring Suppl (ACCU-CHEK GUIDE ME) w/Device KIT [id number] Yes CHECK BLOOD SUGAR TWICE DAILY Dx E11.9 Lavell Lye A, FNP  Active   Cholecalciferol (VITAMIN D3) 10 MCG (400 UNIT) CAPS [id number] Yes Take by mouth. [provider]  Active   dapagliflozin  propanediol (FARXIGA ) 10 MG TABS tablet [id number] Yes Take 1 tablet (10 mg total) by mouth daily before breakfast. Lavell Lye A, FNP  Active   docusate sodium (COLACE) 100 MG capsule [id number] Yes Take 100 mg by mouth daily. [provider]  Active Self  gabapentin  (NEURONTIN ) 400 MG capsule [id number] Yes Take 1 capsule (400 mg total) by mouth 3 (three) times daily. Lavell Lye A, FNP  Active   glipiZIDE  (GLUCOTROL ) 10 MG tablet [id number] Yes Take 1 tablet (10 mg total) by mouth 2 (two) times daily before a meal. Lavell Lye A, FNP  Active   glucose blood  (ACCU-CHEK GUIDE TEST) test strip [id number] Yes CHECK BLOOD SUGAR TWICE DAILY Dx E11.9 Lavell Lye A, FNP  Active   omeprazole  (PRILOSEC) 20 MG capsule [id number] Yes Take 1 capsule (20 mg total) by mouth daily. Lavell Lye A, FNP  Active   valsartan -hydrochlorothiazide  (DIOVAN -HCT) 160-25 MG tablet [id number] Yes Take 1 tablet by mouth daily. Lavell Lye LABOR, FNP  Active             Recommendation:   Continue Current Plan of Care  Follow Up Plan:   Telephone follow-up in 1 month  Rosina Bertrum, BSN RN [facility], [facility] RN Care Manager Direct Dial : [phone]  Fax: [phone]

## 2025-01-24 ENCOUNTER — Ambulatory Visit: Payer: Self-pay | Admitting: Family

## 2025-02-12 ENCOUNTER — Telehealth: Admitting: *Deleted
